# Patient Record
Sex: Female | Born: 1935 | Race: White | Hispanic: No | Smoking: Never smoker
Health system: Southern US, Community
[De-identification: ages and names within clinical notes are randomized; demographics above are authoritative.]

## PROBLEM LIST (undated history)

## (undated) DIAGNOSIS — I10 Essential (primary) hypertension: Secondary | ICD-10-CM

## (undated) DIAGNOSIS — D649 Anemia, unspecified: Secondary | ICD-10-CM

## (undated) DIAGNOSIS — C801 Malignant (primary) neoplasm, unspecified: Secondary | ICD-10-CM

## (undated) DIAGNOSIS — R0600 Dyspnea, unspecified: Secondary | ICD-10-CM

## (undated) DIAGNOSIS — J45909 Unspecified asthma, uncomplicated: Secondary | ICD-10-CM

## (undated) DIAGNOSIS — M1990 Unspecified osteoarthritis, unspecified site: Secondary | ICD-10-CM

## (undated) DIAGNOSIS — K219 Gastro-esophageal reflux disease without esophagitis: Secondary | ICD-10-CM

## (undated) HISTORY — PX: ABDOMINAL HYSTERECTOMY: SHX81

## (undated) HISTORY — PX: RECTAL SURGERY: SHX760

## (undated) HISTORY — PX: BREAST BIOPSY: SHX20

## (undated) HISTORY — PX: BUNIONECTOMY: SHX129

## (undated) HISTORY — PX: INCONTINENCE SURGERY: SHX676

## (undated) HISTORY — PX: CATARACT EXTRACTION, BILATERAL: SHX1313

## (undated) HISTORY — PX: VAGINA SURGERY: SHX829

## (undated) HISTORY — PX: TONSILLECTOMY: SUR1361

---

## 2011-05-22 DIAGNOSIS — N3946 Mixed incontinence: Secondary | ICD-10-CM | POA: Insufficient documentation

## 2011-06-08 DIAGNOSIS — IMO0002 Reserved for concepts with insufficient information to code with codable children: Secondary | ICD-10-CM | POA: Insufficient documentation

## 2012-11-21 DIAGNOSIS — M810 Age-related osteoporosis without current pathological fracture: Secondary | ICD-10-CM | POA: Insufficient documentation

## 2013-06-12 DIAGNOSIS — J45909 Unspecified asthma, uncomplicated: Secondary | ICD-10-CM | POA: Insufficient documentation

## 2014-06-13 DIAGNOSIS — S46211A Strain of muscle, fascia and tendon of other parts of biceps, right arm, initial encounter: Secondary | ICD-10-CM | POA: Insufficient documentation

## 2014-07-17 DIAGNOSIS — K219 Gastro-esophageal reflux disease without esophagitis: Secondary | ICD-10-CM | POA: Insufficient documentation

## 2014-07-17 DIAGNOSIS — J309 Allergic rhinitis, unspecified: Secondary | ICD-10-CM | POA: Insufficient documentation

## 2016-04-10 DIAGNOSIS — M3500 Sicca syndrome, unspecified: Secondary | ICD-10-CM | POA: Insufficient documentation

## 2016-04-10 DIAGNOSIS — I10 Essential (primary) hypertension: Secondary | ICD-10-CM | POA: Insufficient documentation

## 2016-08-17 DIAGNOSIS — C569 Malignant neoplasm of unspecified ovary: Secondary | ICD-10-CM

## 2016-08-17 HISTORY — DX: Malignant neoplasm of unspecified ovary: C56.9

## 2016-12-01 ENCOUNTER — Ambulatory Visit: Payer: Medicare Other

## 2016-12-03 ENCOUNTER — Ambulatory Visit: Payer: Medicare Other

## 2017-04-29 ENCOUNTER — Encounter
Admission: RE | Admit: 2017-04-29 | Discharge: 2017-04-29 | Disposition: A | Discharge status: Home / Self Care | Payer: Medicare Other | Source: Ambulatory Visit | Attending: Surgery | Admitting: Surgery

## 2017-04-29 DIAGNOSIS — I10 Essential (primary) hypertension: Secondary | ICD-10-CM | POA: Insufficient documentation

## 2017-04-29 DIAGNOSIS — R9431 Abnormal electrocardiogram [ECG] [EKG]: Secondary | ICD-10-CM | POA: Insufficient documentation

## 2017-04-29 HISTORY — DX: Anemia, unspecified: D64.9

## 2017-04-29 HISTORY — DX: Unspecified osteoarthritis, unspecified site: M19.90

## 2017-04-29 HISTORY — DX: Essential (primary) hypertension: I10

## 2017-04-29 HISTORY — DX: Gastro-esophageal reflux disease without esophagitis: K21.9

## 2017-04-29 HISTORY — DX: Dyspnea, unspecified: R06.00

## 2017-04-29 HISTORY — DX: Unspecified asthma, uncomplicated: J45.909

## 2017-04-29 NOTE — Patient Instructions (Signed)
Your procedure is scheduled on: Tuesday 05/11/17 Report to [facility]. 2ND FLOOR MEDICAL MALL ENTRANCE. To find out your arrival time please call [phone] between 1PM - 3PM on Monday 05/10/17.  Remember: Instructions that are not followed completely may result in serious medical risk, up to and including death, or upon the discretion of your surgeon and anesthesiologist your surgery may need to be rescheduled.    __X__ 1. Do not eat anything after midnight the night before your    procedure.  No gum chewing or hard candies.  You may drink clear   liquids up to 2 hours before you are scheduled to arrive at the   hospital for your procedure. Do not drink clear liquids within 2   hours of scheduled arrival to the hospital as this may lead to your   procedure being delayed or rescheduled.       Clear liquids include:   Water or Apple juice without pulp   Clear carbohydrate beverage such as Clearfast or Gatorade   Black coffee or Clear Tea (no milk, no creamer, do not add anything   to the coffee or tea)    Diabetics should only drink water   __X__ 2. No Alcohol for 24 hours before or after surgery.   ____ 3. Bring all medications with you on the day of surgery if instructed.    __X__ 4. Notify your doctor if there is any change in your medical condition     (cold, fever, infections).             __X___5. No smoking within 24 hours of your surgery.     Do not wear jewelry, make-up, hairpins, clips or nail polish.  Do not wear lotions, powders, or perfumes.   Do not shave 48 hours prior to surgery. Men may shave face and neck.  Do not bring valuables to the hospital.    [facility] is not responsible for any belongings or valuables.               Contacts, dentures or bridgework may not be worn into surgery.  Leave your suitcase in the car. After surgery it may be brought to your room.  For patients admitted to the hospital, discharge time is determined by your                treatment  team.   Patients discharged the day of surgery will not be allowed to drive home.   Please read over the following fact sheets that you were given:   MRSA Information   __X__ Take these medicines the morning of surgery with A SIP OF WATER:    1. AMLODPINE  2. ESOMEPRAZOLE AT BEDTIME AND AGAIN MORNING OF SURGERY  3.   4.  5.  6.  ____ Fleet Enema (as directed)   ____ Use CHG Soap/SAGE wipes as directed  __X__ Use inhalers on the day of surgery   ____ Stop metformin 2 days prior to surgery    ____ Take 1/2 of usual insulin dose the night before surgery and none on the morning of surgery.   ____ Stop Coumadin/Plavix/aspirin on   __X__ Stop Anti-inflammatories such as Advil, Aleve, Ibuprofen, Motrin, Naproxen, Naprosyn, Goodies,powder, or aspirin products.  OK to take Tylenol.   __X__ Stop supplements, Vitamin E, Fish Oil until after surgery.  THERATEARS 7 DAYS BEFORE  ____ Bring C-Pap to the hospital.

## 2017-04-29 NOTE — Pre-Procedure Instructions (Signed)
EKG/REQUEST FOR CLEARANCE AS INSTRUCTED BY DR PISCITELLO, CALLED AND FAXED TO [place]. ALSO CALLED AND FAXED TO DR Nicholes Stairs. SPOKE TO DAWN

## 2017-05-10 NOTE — Pre-Procedure Instructions (Signed)
CLEARED BY DR Clayborn Bigness 05/06/17

## 2017-05-11 ENCOUNTER — Encounter: Payer: Self-pay | Admitting: Anesthesiology

## 2017-05-11 ENCOUNTER — Encounter
Admission: RE | Disposition: A | Discharge status: Home / Self Care | Payer: Self-pay | Source: Ambulatory Visit | Attending: Surgery

## 2017-05-11 ENCOUNTER — Inpatient Hospital Stay
Admission: RE | Admit: 2017-05-11 | Discharge: 2017-05-15 | DRG: 348 | Disposition: A | Discharge status: Home / Self Care | Payer: Medicare Other | Source: Ambulatory Visit | Attending: Surgery | Admitting: Surgery

## 2017-05-11 ENCOUNTER — Inpatient Hospital Stay: Payer: Medicare Other | Admitting: Anesthesiology

## 2017-05-11 DIAGNOSIS — C800 Disseminated malignant neoplasm, unspecified: Secondary | ICD-10-CM | POA: Diagnosis present

## 2017-05-11 DIAGNOSIS — Z791 Long term (current) use of non-steroidal anti-inflammatories (NSAID): Secondary | ICD-10-CM

## 2017-05-11 DIAGNOSIS — K625 Hemorrhage of anus and rectum: Secondary | ICD-10-CM | POA: Diagnosis not present

## 2017-05-11 DIAGNOSIS — Z9841 Cataract extraction status, right eye: Secondary | ICD-10-CM

## 2017-05-11 DIAGNOSIS — C763 Malignant neoplasm of pelvis: Secondary | ICD-10-CM | POA: Diagnosis present

## 2017-05-11 DIAGNOSIS — M3500 Sicca syndrome, unspecified: Secondary | ICD-10-CM | POA: Diagnosis present

## 2017-05-11 DIAGNOSIS — J45909 Unspecified asthma, uncomplicated: Secondary | ICD-10-CM | POA: Diagnosis present

## 2017-05-11 DIAGNOSIS — Z90710 Acquired absence of both cervix and uterus: Secondary | ICD-10-CM

## 2017-05-11 DIAGNOSIS — R339 Retention of urine, unspecified: Secondary | ICD-10-CM | POA: Diagnosis present

## 2017-05-11 DIAGNOSIS — K219 Gastro-esophageal reflux disease without esophagitis: Secondary | ICD-10-CM | POA: Diagnosis present

## 2017-05-11 DIAGNOSIS — I10 Essential (primary) hypertension: Secondary | ICD-10-CM | POA: Diagnosis present

## 2017-05-11 DIAGNOSIS — Z7951 Long term (current) use of inhaled steroids: Secondary | ICD-10-CM

## 2017-05-11 DIAGNOSIS — Z8249 Family history of ischemic heart disease and other diseases of the circulatory system: Secondary | ICD-10-CM

## 2017-05-11 DIAGNOSIS — D62 Acute posthemorrhagic anemia: Secondary | ICD-10-CM | POA: Diagnosis not present

## 2017-05-11 DIAGNOSIS — Z66 Do not resuscitate: Secondary | ICD-10-CM | POA: Diagnosis present

## 2017-05-11 DIAGNOSIS — K623 Rectal prolapse: Secondary | ICD-10-CM | POA: Diagnosis present

## 2017-05-11 DIAGNOSIS — Z9842 Cataract extraction status, left eye: Secondary | ICD-10-CM

## 2017-05-11 DIAGNOSIS — Z79899 Other long term (current) drug therapy: Secondary | ICD-10-CM

## 2017-05-11 HISTORY — PX: REPAIR OF RECTAL PROLAPSE: SHX6420

## 2017-05-11 LAB — CBC
HCT: 30.4 % — ABNORMAL LOW (ref 35.0–47.0)
Hemoglobin: 10 g/dL — ABNORMAL LOW (ref 12.0–16.0)
MCH: 29.1 pg (ref 26.0–34.0)
MCHC: 32.8 g/dL (ref 32.0–36.0)
MCV: 88.8 fL (ref 80.0–100.0)
Platelets: 386 10*3/uL (ref 150–440)
RBC: 3.43 MIL/uL — ABNORMAL LOW (ref 3.80–5.20)
RDW: 13.8 % (ref 11.5–14.5)
WBC: 6.6 10*3/uL (ref 3.6–11.0)

## 2017-05-11 SURGERY — REPAIR, PROLAPSE, RECTUM
Anesthesia: General | Site: Rectum | Wound class: Contaminated

## 2017-05-11 MED ORDER — PROPOFOL 10 MG/ML IV BOLUS
INTRAVENOUS | Status: DC | PRN
  Administered 2017-05-11: 100 mg via INTRAVENOUS
  Administered 2017-05-11: 30 mg via INTRAVENOUS

## 2017-05-11 MED ORDER — MIDAZOLAM HCL 2 MG/2ML IJ SOLN
INTRAMUSCULAR | Status: AC
  Filled 2017-05-11: qty 2

## 2017-05-11 MED ORDER — DEXTROSE-NACL 5-0.2 % IV SOLN
INTRAVENOUS | Status: DC
  Administered 2017-05-11 – 2017-05-13 (×4): via INTRAVENOUS

## 2017-05-11 MED ORDER — VITAMIN C 500 MG PO TABS
1000.0000 mg | ORAL_TABLET | Freq: Every day | ORAL | Status: DC
  Administered 2017-05-12 – 2017-05-15 (×4): 1000 mg via ORAL
  Filled 2017-05-11 (×5): qty 2

## 2017-05-11 MED ORDER — HYPROMELLOSE 0.3 % OP GEL
1.0000 "application " | Freq: Every day | OPHTHALMIC | Status: DC
  Administered 2017-05-12: 1 via OPHTHALMIC
  Filled 2017-05-11: qty 3.5

## 2017-05-11 MED ORDER — GLUCOSAMINE-MSM-HYALURONIC ACD 750-375-30 MG PO TABS: ORAL_TABLET | Freq: Every day | ORAL | Status: DC

## 2017-05-11 MED ORDER — LOSARTAN POTASSIUM 50 MG PO TABS
100.0000 mg | ORAL_TABLET | Freq: Every day | ORAL | Status: DC
  Administered 2017-05-12 – 2017-05-14 (×3): 100 mg via ORAL
  Filled 2017-05-11 (×4): qty 2

## 2017-05-11 MED ORDER — FENTANYL CITRATE (PF) 100 MCG/2ML IJ SOLN
INTRAMUSCULAR | Status: AC
  Administered 2017-05-11: 25 ug via INTRAVENOUS
  Filled 2017-05-11: qty 2

## 2017-05-11 MED ORDER — ONDANSETRON HCL 4 MG/2ML IJ SOLN: 4.0000 mg | Freq: Once | INTRAMUSCULAR | Status: DC | PRN

## 2017-05-11 MED ORDER — MELOXICAM 7.5 MG PO TABS
15.0000 mg | ORAL_TABLET | Freq: Every day | ORAL | Status: DC
  Administered 2017-05-12 – 2017-05-14 (×3): 15 mg via ORAL
  Filled 2017-05-11 (×2): qty 1
  Filled 2017-05-11 (×2): qty 2

## 2017-05-11 MED ORDER — CYCLOBENZAPRINE HCL 10 MG PO TABS
10.0000 mg | ORAL_TABLET | Freq: Every day | ORAL | Status: DC
  Administered 2017-05-11 – 2017-05-14 (×4): 10 mg via ORAL
  Filled 2017-05-11 (×4): qty 1

## 2017-05-11 MED ORDER — HYDROMORPHONE HCL 1 MG/ML IJ SOLN
INTRAMUSCULAR | Status: AC
  Administered 2017-05-11: 0.5 mg via INTRAVENOUS
  Filled 2017-05-11: qty 1

## 2017-05-11 MED ORDER — ACETAMINOPHEN 650 MG RE SUPP
650.0000 mg | Freq: Four times a day (QID) | RECTAL | Status: DC | PRN
Start: 2017-05-11 — End: 2017-05-15

## 2017-05-11 MED ORDER — HYDROMORPHONE HCL 1 MG/ML IJ SOLN
0.5000 mg | INTRAMUSCULAR | Status: DC | PRN
  Administered 2017-05-11 (×2): 0.5 mg via INTRAVENOUS

## 2017-05-11 MED ORDER — ONDANSETRON HCL 4 MG/2ML IJ SOLN
4.0000 mg | Freq: Once | INTRAMUSCULAR | Status: AC
  Administered 2017-05-11: 4 mg via INTRAVENOUS

## 2017-05-11 MED ORDER — VITAMIN D 1000 UNITS PO TABS
2000.0000 [IU] | ORAL_TABLET | Freq: Every day | ORAL | Status: DC
  Administered 2017-05-12 – 2017-05-15 (×4): 2000 [IU] via ORAL
  Filled 2017-05-11 (×7): qty 2

## 2017-05-11 MED ORDER — ARTIFICIAL TEARS OPHTHALMIC OINT
1.0000 "application " | TOPICAL_OINTMENT | Freq: Every day | OPHTHALMIC | Status: DC
  Administered 2017-05-12 – 2017-05-13 (×3): 1 via OPHTHALMIC
  Filled 2017-05-11: qty 3.5

## 2017-05-11 MED ORDER — DOCUSATE SODIUM 100 MG PO CAPS: 100.0000 mg | ORAL_CAPSULE | Freq: Every evening | ORAL | Status: DC | PRN

## 2017-05-11 MED ORDER — ONDANSETRON HCL 4 MG/2ML IJ SOLN
4.0000 mg | Freq: Four times a day (QID) | INTRAMUSCULAR | Status: DC | PRN
  Administered 2017-05-11: 4 mg via INTRAVENOUS
  Filled 2017-05-11: qty 2

## 2017-05-11 MED ORDER — GLYCOPYRROLATE 0.2 MG/ML IJ SOLN
INTRAMUSCULAR | Status: DC | PRN
  Administered 2017-05-11: 0.2 mg via INTRAVENOUS

## 2017-05-11 MED ORDER — PROPOFOL 10 MG/ML IV BOLUS
INTRAVENOUS | Status: AC
  Filled 2017-05-11: qty 20

## 2017-05-11 MED ORDER — CALCIUM CARBONATE-VITAMIN D 500-200 MG-UNIT PO TABS
1.0000 | ORAL_TABLET | Freq: Every day | ORAL | Status: DC
  Administered 2017-05-12 – 2017-05-15 (×4): 1 via ORAL
  Filled 2017-05-11 (×5): qty 1

## 2017-05-11 MED ORDER — LACTATED RINGERS IV SOLN
INTRAVENOUS | Status: DC
  Administered 2017-05-11: 11:00:00 via INTRAVENOUS

## 2017-05-11 MED ORDER — ROCURONIUM BROMIDE 50 MG/5ML IV SOLN
INTRAVENOUS | Status: AC
  Filled 2017-05-11: qty 1

## 2017-05-11 MED ORDER — LIDOCAINE HCL (PF) 2 % IJ SOLN
INTRAMUSCULAR | Status: AC
  Filled 2017-05-11: qty 2

## 2017-05-11 MED ORDER — PANTOPRAZOLE SODIUM 40 MG PO TBEC
40.0000 mg | DELAYED_RELEASE_TABLET | Freq: Every day | ORAL | Status: DC
  Administered 2017-05-12 – 2017-05-15 (×4): 40 mg via ORAL
  Filled 2017-05-11 (×5): qty 1

## 2017-05-11 MED ORDER — SEVOFLURANE IN SOLN
RESPIRATORY_TRACT | Status: AC
  Filled 2017-05-11: qty 250

## 2017-05-11 MED ORDER — FENTANYL CITRATE (PF) 100 MCG/2ML IJ SOLN
INTRAMUSCULAR | Status: AC
  Filled 2017-05-11: qty 2

## 2017-05-11 MED ORDER — FENTANYL CITRATE (PF) 100 MCG/2ML IJ SOLN
25.0000 ug | INTRAMUSCULAR | Status: DC | PRN
  Administered 2017-05-11 (×4): 25 ug via INTRAVENOUS

## 2017-05-11 MED ORDER — ONDANSETRON HCL 4 MG/2ML IJ SOLN
INTRAMUSCULAR | Status: AC
  Filled 2017-05-11: qty 2

## 2017-05-11 MED ORDER — POLYVINYL ALCOHOL 1.4 % OP SOLN
1.0000 [drp] | Freq: Three times a day (TID) | OPHTHALMIC | Status: DC | PRN
  Filled 2017-05-11: qty 15

## 2017-05-11 MED ORDER — FLUTICASONE FUROATE-VILANTEROL 100-25 MCG/INH IN AEPB
1.0000 | INHALATION_SPRAY | Freq: Every day | RESPIRATORY_TRACT | Status: DC
  Administered 2017-05-11 – 2017-05-14 (×4): 1 via RESPIRATORY_TRACT
  Filled 2017-05-11: qty 28

## 2017-05-11 MED ORDER — AMLODIPINE BESYLATE 5 MG PO TABS
5.0000 mg | ORAL_TABLET | Freq: Every day | ORAL | Status: DC
  Administered 2017-05-12 – 2017-05-15 (×4): 5 mg via ORAL
  Filled 2017-05-11 (×4): qty 1

## 2017-05-11 MED ORDER — TRAMADOL HCL 50 MG PO TABS
50.0000 mg | ORAL_TABLET | ORAL | Status: DC | PRN
  Administered 2017-05-11 – 2017-05-12 (×2): 50 mg via ORAL
  Filled 2017-05-11 (×2): qty 1

## 2017-05-11 MED ORDER — IPRATROPIUM-ALBUTEROL 0.5-2.5 (3) MG/3ML IN SOLN: 3.0000 mL | Freq: Four times a day (QID) | RESPIRATORY_TRACT | Status: DC | PRN

## 2017-05-11 MED ORDER — VITAMIN B-12 1000 MCG PO TABS
2000.0000 ug | ORAL_TABLET | Freq: Every day | ORAL | Status: DC
  Administered 2017-05-12 – 2017-05-15 (×4): 2000 ug via ORAL
  Filled 2017-05-11 (×6): qty 2

## 2017-05-11 MED ORDER — CARBOXYMETHYLCELLULOSE SOD PF 1 % OP GEL: 1.0000 [drp] | Freq: Every day | OPHTHALMIC | Status: DC

## 2017-05-11 MED ORDER — ONDANSETRON 4 MG PO TBDP: 4.0000 mg | ORAL_TABLET | Freq: Four times a day (QID) | ORAL | Status: DC | PRN

## 2017-05-11 MED ORDER — MECLIZINE HCL 25 MG PO TABS
25.0000 mg | ORAL_TABLET | Freq: Three times a day (TID) | ORAL | Status: DC | PRN
  Filled 2017-05-11: qty 1

## 2017-05-11 MED ORDER — MONTELUKAST SODIUM 10 MG PO TABS
10.0000 mg | ORAL_TABLET | Freq: Every day | ORAL | Status: DC
  Administered 2017-05-11 – 2017-05-14 (×4): 10 mg via ORAL
  Filled 2017-05-11 (×4): qty 1

## 2017-05-11 MED ORDER — BUPIVACAINE-EPINEPHRINE (PF) 0.5% -1:200000 IJ SOLN
INTRAMUSCULAR | Status: AC
  Filled 2017-05-11: qty 30

## 2017-05-11 MED ORDER — FENTANYL CITRATE (PF) 100 MCG/2ML IJ SOLN
INTRAMUSCULAR | Status: DC | PRN
  Administered 2017-05-11 (×4): 25 ug via INTRAVENOUS

## 2017-05-11 MED ORDER — ONDANSETRON 4 MG PO TBDP
4.0000 mg | ORAL_TABLET | ORAL | Status: DC | PRN
Start: 2017-05-11 — End: 2017-05-15

## 2017-05-11 MED ORDER — LIDOCAINE HCL (CARDIAC) 20 MG/ML IV SOLN
INTRAVENOUS | Status: DC | PRN
  Administered 2017-05-11: 40 mg via INTRAVENOUS

## 2017-05-11 MED ORDER — ACETAMINOPHEN 325 MG PO TABS
650.0000 mg | ORAL_TABLET | ORAL | Status: DC | PRN
  Administered 2017-05-14: 650 mg via ORAL
  Filled 2017-05-11: qty 2

## 2017-05-11 MED ORDER — ALBUTEROL SULFATE (2.5 MG/3ML) 0.083% IN NEBU: 2.5000 mg | INHALATION_SOLUTION | Freq: Four times a day (QID) | RESPIRATORY_TRACT | Status: DC | PRN

## 2017-05-11 MED ORDER — LORATADINE 10 MG PO TABS
10.0000 mg | ORAL_TABLET | Freq: Every day | ORAL | Status: DC
  Administered 2017-05-12 – 2017-05-15 (×4): 10 mg via ORAL
  Filled 2017-05-11 (×6): qty 1

## 2017-05-11 MED ORDER — DEXAMETHASONE SODIUM PHOSPHATE 10 MG/ML IJ SOLN
INTRAMUSCULAR | Status: DC | PRN
  Administered 2017-05-11: 5 mg via INTRAVENOUS

## 2017-05-11 MED ORDER — ONDANSETRON HCL 4 MG/2ML IJ SOLN
4.0000 mg | INTRAMUSCULAR | Status: DC | PRN
  Administered 2017-05-12: 4 mg via INTRAVENOUS
  Filled 2017-05-11 (×2): qty 2

## 2017-05-11 MED ORDER — FUROSEMIDE 20 MG PO TABS
20.0000 mg | ORAL_TABLET | Freq: Every day | ORAL | Status: DC
  Administered 2017-05-12 – 2017-05-15 (×3): 20 mg via ORAL
  Filled 2017-05-11 (×3): qty 1

## 2017-05-11 MED ORDER — DEXTROSE 5 % IV SOLN
2.0000 g | Freq: Once | INTRAVENOUS | Status: AC
  Administered 2017-05-11: 2 g via INTRAVENOUS
  Filled 2017-05-11: qty 2

## 2017-05-11 SURGICAL SUPPLY — 23 items
BLADE SURG 15 STRL LF DISP TIS (BLADE) ×1 IMPLANT
BLADE SURG 15 STRL SS (BLADE) ×2
CANISTER SUCT 1200ML W/VALVE (MISCELLANEOUS) ×3 IMPLANT
DRAPE LAPAROTOMY 100X77 ABD (DRAPES) ×3 IMPLANT
DRAPE LEGGINS SURG 28X43 STRL (DRAPES) ×3 IMPLANT
DRAPE UNDER BUTTOCK W/FLU (DRAPES) ×3 IMPLANT
ELECT REM PT RETURN 9FT ADLT (ELECTROSURGICAL) ×3
ELECTRODE REM PT RTRN 9FT ADLT (ELECTROSURGICAL) ×1 IMPLANT
GAUZE SPONGE 4X4 12PLY STRL (GAUZE/BANDAGES/DRESSINGS) ×3 IMPLANT
GLOVE BIO SURGEON STRL SZ7.5 (GLOVE) ×3 IMPLANT
LABEL OR SOLS (LABEL) ×3 IMPLANT
NEEDLE HYPO 25X1 1.5 SAFETY (NEEDLE) ×3 IMPLANT
NS IRRIG 500ML POUR BTL (IV SOLUTION) ×3 IMPLANT
PACK BASIN MINOR ARMC (MISCELLANEOUS) ×3 IMPLANT
PAD PREP 24X41 OB/GYN DISP (PERSONAL CARE ITEMS) ×3 IMPLANT
RELOAD PROXIMATE 75MM BLUE (ENDOMECHANICALS) ×9 IMPLANT
SOL PREP PVP 2OZ (MISCELLANEOUS) ×3
SOLUTION PREP PVP 2OZ (MISCELLANEOUS) ×1 IMPLANT
STAPLER PROXIMATE 75MM BLUE (STAPLE) ×3 IMPLANT
SURGILUBE 2OZ TUBE FLIPTOP (MISCELLANEOUS) ×3 IMPLANT
SUT CHROMIC 2 0 SH (SUTURE) IMPLANT
SUT CHROMIC 3 0 SH 27 (SUTURE) ×3 IMPLANT
SYRINGE 10CC LL (SYRINGE) ×3 IMPLANT

## 2017-05-11 NOTE — Op Note (Signed)
OPERATIVE REPORT  PREOPERATIVE  DIAGNOSIS: .Marland Kitchen Rectal prolapse  POSTOPERATIVE DIAGNOSIS: .Marland Kitchen Rectal prolapse  PROCEDURE: .Marland Kitchen Excision of prolapsed rectum  ANESTHESIA:  General  SURGEON: Rochel Brome  MD   INDICATIONS: .Marland Kitchen She has a history of rectal prolapse which occurs when she stands up. There is associated pain. Prolapsed rectum was demonstrated on physical exam and surgery was recommended for definitive treatment.  With the patient on the operating table in the supine position she was placed under general anesthesia. Legs were elevated into the lithotomy position using ankle straps. The perineum was prepared with Betadine solution and draped with sterile towels and sheets. . Lubricating jelly an index finger were used to manipulate the rectum so that it prolapsed approximately 5 cm. This was palpated circumferentially.Marland Kitchen Sphincter tone was very hypotonic. A Babcock clamp was placed on the anterior portion of the prolapse and another Babcock on the posterior portion. Traction was applied as the 75 mm GIA stapler was placed initially on the left side dividing the prolapsed rectum in a longitudinal direction. A reload was placed on the right side dividing the rectum also in a lonitudinal direction. Next a reload of the GIA stapler was placed with a horizontal orientation across the anterior portion of the rectum and activated... A small proximally 4 mm segment was not completely divided and was suture ligated with 3-0 chromic and then divided with the scissors. A similar procedure was carried out with the posterior segment with a horizontal oriented suture line.  The staple line promptly reduced back up into the normal anatomic position.. The staple lines were palpated.. The bivalve anal retractor was introduced. The staple lines were inspected and hemostasis was intact. Only a trace of blood was aspirated. .the resected 2 segments of rectum were submitted in formalin for routine pathology.. Cotton gauze  was applied with 2 inch paper tape  The patient tolerated the procedure satisfactorily and was then prepared for transfer to the recovery room.  Rochel Brome M.D.

## 2017-05-11 NOTE — Progress Notes (Signed)
Pt was helped to the [facility], pt passed a large blood clot from her rectum while urinating. Dr. Tamala Julian was notified of findings, new orders for CBC stat. Pt is also very nausea, PRN zofran was already given with no relief, new orders for one time dose of zofran and change PRN zofran to Q4. Will continue to monitor pt.   Climmie Cronce [employer]

## 2017-05-11 NOTE — Transfer of Care (Signed)
Immediate Anesthesia Transfer of Care Note  Patient: Tamara Mckinney  Procedure(s) Performed: Procedure(s): REPAIR OF RECTAL PROLAPSE (N/A)  Patient Location: PACU  Anesthesia Type:General  Level of Consciousness: sedated  Airway & Oxygen Therapy: Patient Spontanous Breathing and Patient connected to face mask oxygen  Post-op Assessment: Report given to RN and Post -op Vital signs reviewed and stable  Post vital signs: Reviewed and stable  Last Vitals: There were no vitals filed for this visit.  Last Pain: There were no vitals filed for this visit.       Complications: No apparent anesthesia complications

## 2017-05-11 NOTE — Anesthesia Procedure Notes (Signed)
Procedure Name: LMA Insertion Date/Time: 05/11/2017 11:58 AM Performed by: Hedda Slade Pre-anesthesia Checklist: Patient identified, Patient being monitored, Timeout performed, Emergency Drugs available and Suction available Patient Re-evaluated:Patient Re-evaluated prior to induction Oxygen Delivery Method: Circle system utilized Preoxygenation: Pre-oxygenation with 100% oxygen Induction Type: IV induction Ventilation: Mask ventilation without difficulty LMA: LMA inserted LMA Size: 4.0 Tube type: Oral Number of attempts: 1 Placement Confirmation: positive ETCO2 and breath sounds checked- equal and bilateral Tube secured with: Tape Dental Injury: Teeth and Oropharynx as per pre-operative assessment

## 2017-05-11 NOTE — Anesthesia Postprocedure Evaluation (Signed)
Anesthesia Post Note  Patient: Tamara Mckinney  Procedure(s) Performed: Procedure(s) (LRB): REPAIR OF RECTAL PROLAPSE (N/A)  Patient location during evaluation: PACU Anesthesia Type: General Level of consciousness: awake and alert Pain management: pain level controlled Vital Signs Assessment: post-procedure vital signs reviewed and stable Respiratory status: spontaneous breathing, nonlabored ventilation, respiratory function stable and patient connected to nasal cannula oxygen Cardiovascular status: blood pressure returned to baseline and stable Postop Assessment: no apparent nausea or vomiting Anesthetic complications: no     Last Vitals:  Vitals:   05/11/17 1254 05/11/17 1309  BP: (!) 146/83 (!) 142/84  Pulse: 95 94  Resp: 20 18  Temp: 36.4 C   SpO2: 100% 99%    Last Pain:  Vitals:   05/11/17 1309  PainSc: 7                  Omnia Dollinger S

## 2017-05-11 NOTE — Progress Notes (Signed)
PHARMACIST - PHYSICIAN ORDER COMMUNICATION  CONCERNING: P&T Medication Policy on Herbal Medications  DESCRIPTION:  This patient's order for:  Glucosamine-MSM-hyaluronic acid  has been noted.  This product(s) is classified as an "herbal" or natural product. Due to a lack of definitive safety studies or FDA approval, nonstandard manufacturing practices, plus the potential risk of unknown drug-drug interactions while on inpatient medications, the Pharmacy and Therapeutics Committee does not permit the use of "herbal" or natural products of this type within [facility].   ACTION TAKEN: The pharmacy department is unable to verify this order at this time and your patient has been informed of this safety policy. Please reevaluate patient's clinical condition at discharge and address if the herbal or natural product(s) should be resumed at that time.

## 2017-05-11 NOTE — H&P (Signed)
  She comes in today for excision of prolapsing rectum.. She reports she continues to have painful problem with prolapsing rectum each day especially when she stands up.She had recent laboratory findings of elevated creatinine at 1.5 and elevated potassium at 5.8.her potassium later decreased to 5.1. Her Lasix dose was cut from 40 mg down to 20 mg.  She reports no change in overall condition since the office exam.  I discussed the plan for excision of prolapsing rectum.Marland Kitchen Anticipate keeping her overnight for a period of observation.

## 2017-05-11 NOTE — Anesthesia Post-op Follow-up Note (Signed)
Anesthesia QCDR form completed.        

## 2017-05-11 NOTE — Progress Notes (Signed)
Postoperatively she does report some nausea.  She has just received some Zofran.  She reports minimal discomfort in the anal area.  I discussed the surgery and the plan for overnight hospital care.  Anticipate she will tolerate her diet and do some walking prior to discharge.

## 2017-05-11 NOTE — Anesthesia Preprocedure Evaluation (Signed)
Anesthesia Evaluation  Patient identified by MRN, date of birth, ID band Patient awake    Reviewed: Allergy & Precautions, NPO status , Patient's Chart, lab work & pertinent test results, reviewed documented beta blocker date and time   Airway Mallampati: II  TM Distance: >3 FB     Dental  (+) Chipped   Pulmonary shortness of breath, asthma ,           Cardiovascular hypertension, Pt. on medications      Neuro/Psych    GI/Hepatic GERD  Controlled,  Endo/Other    Renal/GU      Musculoskeletal  (+) Arthritis ,   Abdominal   Peds  Hematology  (+) anemia ,   Anesthesia Other Findings EKG shows poor R waves. No cardiac symptoms, will proceed.  Reproductive/Obstetrics                             Anesthesia Physical Anesthesia Plan  ASA: III  Anesthesia Plan: General   Post-op Pain Management:    Induction: Intravenous  PONV Risk Score and Plan:   Airway Management Planned: Oral ETT and LMA  Additional Equipment:   Intra-op Plan:   Post-operative Plan:   Informed Consent: I have reviewed the patients History and Physical, chart, labs and discussed the procedure including the risks, benefits and alternatives for the proposed anesthesia with the patient or authorized representative who has indicated his/her understanding and acceptance.     Plan Discussed with: CRNA  Anesthesia Plan Comments:         Anesthesia Quick Evaluation

## 2017-05-12 DIAGNOSIS — I10 Essential (primary) hypertension: Secondary | ICD-10-CM | POA: Diagnosis present

## 2017-05-12 DIAGNOSIS — D62 Acute posthemorrhagic anemia: Secondary | ICD-10-CM | POA: Diagnosis not present

## 2017-05-12 DIAGNOSIS — Z90710 Acquired absence of both cervix and uterus: Secondary | ICD-10-CM | POA: Diagnosis not present

## 2017-05-12 DIAGNOSIS — K623 Rectal prolapse: Secondary | ICD-10-CM | POA: Diagnosis present

## 2017-05-12 DIAGNOSIS — K625 Hemorrhage of anus and rectum: Secondary | ICD-10-CM | POA: Diagnosis not present

## 2017-05-12 DIAGNOSIS — C800 Disseminated malignant neoplasm, unspecified: Secondary | ICD-10-CM | POA: Diagnosis present

## 2017-05-12 DIAGNOSIS — Z8249 Family history of ischemic heart disease and other diseases of the circulatory system: Secondary | ICD-10-CM | POA: Diagnosis not present

## 2017-05-12 DIAGNOSIS — K219 Gastro-esophageal reflux disease without esophagitis: Secondary | ICD-10-CM | POA: Diagnosis present

## 2017-05-12 DIAGNOSIS — Z791 Long term (current) use of non-steroidal anti-inflammatories (NSAID): Secondary | ICD-10-CM | POA: Diagnosis not present

## 2017-05-12 DIAGNOSIS — Z9841 Cataract extraction status, right eye: Secondary | ICD-10-CM | POA: Diagnosis not present

## 2017-05-12 DIAGNOSIS — J45909 Unspecified asthma, uncomplicated: Secondary | ICD-10-CM | POA: Diagnosis present

## 2017-05-12 DIAGNOSIS — Z9842 Cataract extraction status, left eye: Secondary | ICD-10-CM | POA: Diagnosis not present

## 2017-05-12 DIAGNOSIS — R339 Retention of urine, unspecified: Secondary | ICD-10-CM | POA: Diagnosis present

## 2017-05-12 DIAGNOSIS — Z79899 Other long term (current) drug therapy: Secondary | ICD-10-CM | POA: Diagnosis not present

## 2017-05-12 DIAGNOSIS — M3500 Sicca syndrome, unspecified: Secondary | ICD-10-CM | POA: Diagnosis present

## 2017-05-12 DIAGNOSIS — Z66 Do not resuscitate: Secondary | ICD-10-CM | POA: Diagnosis present

## 2017-05-12 DIAGNOSIS — C763 Malignant neoplasm of pelvis: Secondary | ICD-10-CM | POA: Diagnosis present

## 2017-05-12 DIAGNOSIS — Z7951 Long term (current) use of inhaled steroids: Secondary | ICD-10-CM | POA: Diagnosis not present

## 2017-05-12 LAB — BASIC METABOLIC PANEL
Anion gap: 7 (ref 5–15)
BUN: 33 mg/dL — ABNORMAL HIGH (ref 6–20)
CO2: 25 mmol/L (ref 22–32)
Calcium: 8.5 mg/dL — ABNORMAL LOW (ref 8.9–10.3)
Chloride: 101 mmol/L (ref 101–111)
Creatinine, Ser: 1.05 mg/dL — ABNORMAL HIGH (ref 0.44–1.00)
GFR calc Af Amer: 54 mL/min — ABNORMAL LOW (ref >60)
GFR calc non Af Amer: 46 mL/min — ABNORMAL LOW (ref >60)
Glucose, Bld: 138 mg/dL — ABNORMAL HIGH (ref 65–99)
Potassium: 5 mmol/L (ref 3.5–5.1)
Sodium: 133 mmol/L — ABNORMAL LOW (ref 135–145)

## 2017-05-12 LAB — HEMOGLOBIN AND HEMATOCRIT, BLOOD
HCT: 24.2 % — ABNORMAL LOW (ref 35.0–47.0)
HCT: 21.9 % — ABNORMAL LOW (ref 35.0–47.0)
HCT: 23.2 % — ABNORMAL LOW (ref 35.0–47.0)
Hemoglobin: 8.1 g/dL — ABNORMAL LOW (ref 12.0–16.0)
Hemoglobin: 7.2 g/dL — ABNORMAL LOW (ref 12.0–16.0)
Hemoglobin: 7.9 g/dL — ABNORMAL LOW (ref 12.0–16.0)

## 2017-05-12 LAB — ABO/RH: ABO/RH(D): A POS

## 2017-05-12 LAB — PREPARE RBC (CROSSMATCH)

## 2017-05-12 MED ORDER — MENTHOL 3 MG MT LOZG
1.0000 | LOZENGE | OROMUCOSAL | Status: DC | PRN
  Administered 2017-05-12: 3 mg via ORAL
  Filled 2017-05-12: qty 9

## 2017-05-12 MED ORDER — ALUM & MAG HYDROXIDE-SIMETH 200-200-20 MG/5ML PO SUSP
30.0000 mL | Freq: Four times a day (QID) | ORAL | Status: DC | PRN
  Administered 2017-05-12 – 2017-05-13 (×2): 30 mL via ORAL
  Filled 2017-05-12 (×2): qty 30

## 2017-05-12 MED ORDER — SODIUM CHLORIDE 0.9 % IV SOLN
Freq: Once | INTRAVENOUS | Status: AC
  Administered 2017-05-12: 09:00:00 via INTRAVENOUS

## 2017-05-12 NOTE — Progress Notes (Signed)
Telephoned Dr Tamala Julian four times. No response. Nurse was calling to inform that patient has passed another large clot from rectum. Patient's VSS.

## 2017-05-12 NOTE — Progress Notes (Signed)
She feels some better than this morning.  She has had a small breakfast.  She has had a blood transfusion.  She feels that she is releasing some urine into her adult diaper.  She reports no current bladder distention.  She has not yet done any walking since surgery.  Her IV has been converted to a saline lock.  On examination abdomen is soft and flat and nontender.  There is some black clotted blood oozing out of the rectum.  Sphincter tone is very hypotonic. No prolapse seen.  Oxygen saturation on room air is 99%.  Status post excision of prolapsed rectum, postoperative bleeding with acute and chronic anemia.  Recheck hemoglobin, discontinue oxygen.  Progress discussed with patient and son

## 2017-05-12 NOTE — Progress Notes (Signed)
She has lots of blood from her rectum during the night. Hemoglobin has decreased to 7.2.She reports she had some moderate pain during the night but is better this morning. She was unable to empty her bladder and bladder ultrasound demonstrated distention. And out catheterization was doneattending 1100 cc of urine.  On exam she is awake alert and oriented. Lung sounds are clear. Examination of the rectum reveals clotted blood oozing from the rectum. Sphincter tone is hypotonic. An index finger was inserted and delivered additional clotted blood out of the rectum. No bright red blood was seen.  She has been having some difficulty emptying her bladder over a period of time. She also has history of chronic anemia and has been seeing some blood with bowel movements over the last 2 months.  Basic metabolic panel with creatinine of 1.05, potassium of 5.0. Hemoglobin this morning 7.2.. Platelet count yesterday was 386,000   Impression status post excision of prolapsed rectum, postoperative bleeding, acute and chronic anemia  I recommended a blood transfusion. I discussed transfusion risk and benefits  We'll need to keep her in the hospital for additional time as inpatient

## 2017-05-13 ENCOUNTER — Telehealth: Payer: Self-pay | Admitting: Urology

## 2017-05-13 ENCOUNTER — Encounter: Payer: Self-pay | Admitting: Specialist

## 2017-05-13 LAB — HEMOGLOBIN AND HEMATOCRIT, BLOOD
HCT: 20.8 % — ABNORMAL LOW (ref 35.0–47.0)
Hemoglobin: 7.1 g/dL — ABNORMAL LOW (ref 12.0–16.0)

## 2017-05-13 LAB — PREPARE RBC (CROSSMATCH)

## 2017-05-13 LAB — HEMOGLOBIN: Hemoglobin: 9.5 g/dL — ABNORMAL LOW (ref 12.0–16.0)

## 2017-05-13 MED ORDER — ALUM & MAG HYDROXIDE-SIMETH 200-200-20 MG/5ML PO SUSP: 30.0000 mL | Freq: Four times a day (QID) | ORAL | Status: DC | PRN

## 2017-05-13 MED ORDER — POLYETHYLENE GLYCOL 3350 17 G PO PACK
17.0000 g | PACK | Freq: Every day | ORAL | Status: DC
  Administered 2017-05-13 – 2017-05-15 (×3): 17 g via ORAL
  Filled 2017-05-13 (×3): qty 1

## 2017-05-13 MED ORDER — SODIUM CHLORIDE 0.9 % IV SOLN
Freq: Once | INTRAVENOUS | Status: AC
  Administered 2017-05-13: 16:00:00 via INTRAVENOUS

## 2017-05-13 MED ORDER — FAMOTIDINE 20 MG PO TABS
20.0000 mg | ORAL_TABLET | Freq: Two times a day (BID) | ORAL | Status: DC
  Administered 2017-05-13 – 2017-05-14 (×2): 20 mg via ORAL
  Filled 2017-05-13 (×2): qty 1

## 2017-05-13 NOTE — Telephone Encounter (Signed)
App made ° ° °michelle °

## 2017-05-13 NOTE — Progress Notes (Signed)
I discussed with Dr. Sharin Grave in urology who recommended management with the catheter possibly go home with a catheter and follow up in the office. She did have in and out catheterization approximately 1 PM. She has not voided since then. She does report some heartburn. She is taking small amounts of solid food and drinking some water. She continues to receive her maintenance IV at 50 cc/h. She is now receiving the order blood transfusion. She has not walked since I saw her this morning. Her son is going to bring her walker from their car.  She is awake alert and oriented. Abdomen is with some gaseous distention otherwise soft and nontender. Examination of the anal area reveals just a trace of blood on gauze.A finger was introduced into the rectum and did find some dark blood stain.  Plan is to obtain a walker and ambulate in the hallway. Also recommended that she attempt to empty her bladder sitting on a commode. Also recommended sitting up in a chair for meals. If she cannot void and bladder scan is greater than 400 cc we'll insert indwelling Foley urinary catheter. Repeat hemoglobin and metabolic panel in a.m.

## 2017-05-13 NOTE — Progress Notes (Signed)
She reports some gradual improvement. There is been no significant bleeding since last evening. She reports she has taken some solid food but notes early satiety. No nausea or vomiting. She reports some abdominal distention. She is not aware of passing her urine but has had in and out catheterization numerous times. She has walked several steps in her room.  On examination her abdomen is protuberant and tympanitic and soft and nontender. External anal appearance reveals only a trace of blood on digital rectal exam demonstrates just a trace of blood.  Hemoglobin this morning is 7.1  Impression: Status post excision of prolapsed rectum Acute and chronic anemia Urinary retention Obstipation  Plan is to give her MiraLAX. I discussed with one of the local urologist recommended using catheter as needed for urinary retention. May possibly need is sent home with Foley catheter. Also recommended and have ordered a second blood transfusion. Work on ambulation in the hallway as tolerated.

## 2017-05-13 NOTE — Consult Note (Signed)
[facility] [place] NAME: Tamara Mckinney    MR#:  [id number]  DATE OF BIRTH:  1928/07/26  DATE OF CONSULT:  05/13/2017  PRIMARY CARE PHYSICIAN: [name], MD   REQUESTING/REFERRING PHYSICIAN: Dr. Jamal Collin.    CHIEF COMPLAINT:  No chief complaint on file. Chest pain.    HISTORY OF PRESENT ILLNESS:  Tamara Mckinney  is a 83 y.o. female with a known history of Hypertension, GERD, asthma, Sjogren's who presents to the hospital due to rectal prolapse and is status post excision of the rectal prolapse and postoperatively noted to have some intermittent bleeding for which she is getting transfused.  His evening patient was noted to have some vague chest pain. Patient complains of achiness in the midsternum but nonradiating to her neck or left arm. She denies any shortness of breath, diaphoresis palpitations nausea or vomiting. Hospitalist services were contacted for consultation. EKG reviewed shows sinus tachycardia with no acute ST or T-wave changes. Patient denies any other associated symptoms presently.  PAST MEDICAL HISTORY:   Past Medical History:  Diagnosis Date  . Anemia   . Arthritis   . Asthma   . Dyspnea   . GERD (gastroesophageal reflux disease)   . Hypertension   . Sjogren's disease ([place])     PAST SURGICAL HISTOIRY:   Past Surgical History:  Procedure Laterality Date  . ABDOMINAL HYSTERECTOMY    . BREAST BIOPSY     x6  . BUNIONECTOMY Bilateral   . CATARACT EXTRACTION, BILATERAL    . INCONTINENCE SURGERY    . REPAIR OF RECTAL PROLAPSE N/A 05/11/2017   Procedure: REPAIR OF RECTAL PROLAPSE;  Surgeon: Leonie Green, MD;  Location: [facility] ORS;  Service: General;  Laterality: N/A;  . TONSILLECTOMY      SOCIAL HISTORY:   Social History  Substance Use Topics  . Smoking status: Never Smoker  . Smokeless tobacco: Never Used  . Alcohol use No    FAMILY HISTORY:   Family History  Problem Relation Age of Onset  . Heart  disease Mother   . Heart disease Father     DRUG ALLERGIES:  No Known Allergies  REVIEW OF SYSTEMS:   Review of Systems  Constitutional: Negative for fever and weight loss.  HENT: Negative for congestion, nosebleeds and tinnitus.   Eyes: Negative for blurred vision, double vision and redness.  Respiratory: Negative for cough, hemoptysis and shortness of breath.   Cardiovascular: Positive for chest pain. Negative for orthopnea, leg swelling and PND.  Gastrointestinal: Negative for abdominal pain, diarrhea, melena, nausea and vomiting.  Genitourinary: Negative for dysuria, hematuria and urgency.  Musculoskeletal: Negative for falls and joint pain.  Neurological: Negative for dizziness, tingling, sensory change, focal weakness, seizures, weakness and headaches.  Endo/Heme/Allergies: Negative for polydipsia. Does not bruise/bleed easily.  Psychiatric/Behavioral: Negative for depression and memory loss. The patient is not nervous/anxious.      MEDICATIONS AT HOME:   Prior to Admission medications   Medication Sig Start Date End Date Taking? Authorizing Provider  acetaminophen (TYLENOL) 500 MG tablet Take 500 mg by mouth 2 (two) times daily as needed (for pain/sinus issues).   Yes [provider]  albuterol (PROVENTIL HFA;VENTOLIN HFA) 108 (90 Base) MCG/ACT inhaler Inhale 1-2 puffs into the lungs every 6 (six) hours as needed for wheezing or shortness of breath.   Yes [provider]  amLODipine (NORVASC) 5 MG tablet Take 5 mg by mouth daily with breakfast.   Yes [provider]  Ascorbic Acid (VITAMIN C) 1000 MG tablet Take 1,000 mg by mouth daily.   Yes [provider]  Calcium Carb-Cholecalciferol (CALTRATE 600+D) 600-800 MG-UNIT TABS Take 1 tablet by mouth daily.   Yes [provider]  Carboxymeth-Glyc-Polysorb PF (REFRESH OPTIVE ADVANCED PF) 0.5-1-0.5 % SOLN Place 1-2 drops into both eyes 3 (three) times daily as needed (for dry irritated  eyes).   Yes [provider]  Carboxymethylcellulose Sod PF (REFRESH CELLUVISC) 1 % GEL Place 1 drop into both eyes daily. In the middle of the night   Yes [provider]  cetirizine (ZYRTEC) 10 MG tablet Take 10 mg by mouth at bedtime as needed for allergies.   Yes [provider]  Cholecalciferol (VITAMIN D3) 2000 units capsule Take 2,000 Units by mouth daily.   Yes [provider]  Cyanocobalamin (RA VITAMIN B12) 2000 MCG TBCR Take 2,000 mcg by mouth daily.   Yes [provider]  cyclobenzaprine (FLEXERIL) 10 MG tablet Take 10 mg by mouth at bedtime.   Yes [provider]  DHA-EPA-Flaxseed Oil-Vitamin E (THERA TEARS NUTRITION PO) Take 2 tablets by mouth daily.   Yes [provider]  docusate sodium (COLACE) 100 MG capsule Take 100-300 mg by mouth at bedtime as needed for mild constipation.   Yes [provider]  esomeprazole (NEXIUM) 40 MG capsule Take 40 mg by mouth daily at 12 noon.   Yes [provider]  fluticasone furoate-vilanterol (BREO ELLIPTA) 100-25 MCG/INH AEPB Inhale 1 puff into the lungs at bedtime.   Yes [provider]  furosemide (LASIX) 40 MG tablet Take 20 mg by mouth daily with breakfast.    Yes [provider]  Glucosamine-MSM-Hyaluronic Acd ([place]) Take 1 tablet by mouth daily.   Yes [provider]  hypromellose (SYSTANE OVERNIGHT THERAPY) 0.3 % GEL ophthalmic ointment Place 1 application into both eyes at bedtime.   Yes [provider]  ipratropium-albuterol (DUONEB) 0.5-2.5 (3) MG/3ML SOLN Take 3 mLs by nebulization every 6 (six) hours as needed (for wheezing/shortness of breath).   Yes [provider]  losartan (COZAAR) 100 MG tablet Take 100 mg by mouth at bedtime.   Yes [provider]  meclizine (ANTIVERT) 25 MG tablet Take 25 mg by mouth 3 (three) times daily as needed (for dizziness/vertigo).   Yes [provider]   meloxicam (MOBIC) 15 MG tablet Take 15 mg by mouth daily with breakfast.   Yes [provider]  montelukast (SINGULAIR) 10 MG tablet Take 10 mg by mouth at bedtime.   Yes [provider]  triamcinolone cream (KENALOG) 0.5 % Apply 1 application topically as needed.   Yes [provider]      VITAL SIGNS:  Blood pressure 139/74, pulse (!) 117, temperature 99.2 F (37.3 C), temperature source Oral, resp. rate 17, height 4\' 9"  (1.448 m), weight 45.4 kg (100 lb), SpO2 96 %.  PHYSICAL EXAMINATION:  GENERAL:  83 y.o.-year-old patient lying in the bed in no acute distress.  EYES: Pupils equal, round, reactive to light and accommodation. No scleral icterus. Extraocular muscles intact.  HEENT: Head atraumatic, normocephalic. Oropharynx and nasopharynx clear.  NECK:  Supple, no jugular venous distention. No thyroid enlargement, no tenderness.  LUNGS: Normal breath sounds bilaterally, no wheezing, rales, rhonchi . No use of accessory muscles of respiration.  CARDIOVASCULAR: S1, S2, RRR, Tachycardic. No murmurs, rubs, gallops, clicks.  ABDOMEN: Soft, nontender, nondistended. Bowel sounds present. No organomegaly or mass.  EXTREMITIES: No pedal edema, cyanosis, or clubbing.  NEUROLOGIC: Cranial nerves II through XII are intact. No focal motor or sensory deficits appreciated bilaterally  PSYCHIATRIC: The patient is alert and oriented x 3.  SKIN: No obvious rash, lesion, or ulcer.   LABORATORY PANEL:   CBC  Recent Labs Lab 05/11/17 1745  05/13/17 0415  WBC 6.6  --   --   HGB 10.0*  < > 7.1*  HCT 30.4*  < > 20.8*  PLT 386  --   --   < > = values in this interval not displayed. ------------------------------------------------------------------------------------------------------------------  Chemistries   Recent Labs Lab 05/12/17 0207  NA 133*  K 5.0  CL 101  CO2 25  GLUCOSE 138*  BUN 33*  CREATININE 1.05*  CALCIUM 8.5*    ------------------------------------------------------------------------------------------------------------------  Cardiac Enzymes No results for input(s): TROPONINI in the last 168 hours. ------------------------------------------------------------------------------------------------------------------  RADIOLOGY:  No results found.   IMPRESSION AND PLAN:   83 year old female with past medical history of GERD, hypertension, Sjogren's disease, asthma, osteoarthritis who presented to the hospital due to rectal prolapse status post excision of the rectal prolapse with some postoperative bleeding.  Hospitalist services were consulted for patient having some atypical chest pain.  1. Chest pain-patient's symptoms are very atypical for angina. I think her symptoms are most likely related to indigestion/dyspepsia. I would continue some Maalox as needed. EKG has been review shows sinus tachycardia with no acute ST or T-wave changes. -Patient is slightly tachycardic but likely that is related to her volume loss from her anemia, and I will check a stat hemoglobin and transfuse if needed.  2. Rectal prolapse status post excision with some postoperative bleeding-continue further care as per general surgery. Continue to transfuse as needed. I will check a stat hemoglobin now.  3. Urinary retention-patient has been getting intermittent and out catheterization. Continue care as per surgery and urology as needed.  4. Essential hypertension-continue losartan, Norvasc.  5. Asthma-no acute exacerbation. Cont. Breo-Elipta - cont. duonebs as needed.   6. GERD - cont. Protonix/Maalox.   Thank you so much for the consultation will follow along with you.   All the records are reviewed and case discussed with Consulting provider. Management plans discussed with the patient, family and they are in agreement.  CODE STATUS: Full code  TOTAL TIME TAKING CARE OF THIS PATIENT: 45 minutes.     Henreitta Leber M.D on 05/13/2017 at 9:10 PM  Between 7am to 6pm - Pager - [phone]  After 6pm go to [email or web address] - password [email or web address] [facility]  Office  [phone]  CC: Primary care Physician: [name], MD

## 2017-05-13 NOTE — Telephone Encounter (Signed)
-----   Message from Nickie Retort, MD sent at 05/13/2017 11:17 AM EDT ----- Patient needs to be seen for outpt follow up in 1 - 2 weeks. Needs AM appt. Will likely have foley and needs TOV

## 2017-05-14 ENCOUNTER — Inpatient Hospital Stay: Payer: Medicare Other

## 2017-05-14 LAB — HEMOGLOBIN AND HEMATOCRIT, BLOOD
HCT: 26.1 % — ABNORMAL LOW (ref 35.0–47.0)
Hemoglobin: 9.1 g/dL — ABNORMAL LOW (ref 12.0–16.0)

## 2017-05-14 LAB — BASIC METABOLIC PANEL
Anion gap: 6 (ref 5–15)
BUN: 31 mg/dL — ABNORMAL HIGH (ref 6–20)
CO2: 25 mmol/L (ref 22–32)
Calcium: 7.9 mg/dL — ABNORMAL LOW (ref 8.9–10.3)
Chloride: 99 mmol/L — ABNORMAL LOW (ref 101–111)
Creatinine, Ser: 1.15 mg/dL — ABNORMAL HIGH (ref 0.44–1.00)
GFR calc Af Amer: 48 mL/min — ABNORMAL LOW (ref >60)
GFR calc non Af Amer: 42 mL/min — ABNORMAL LOW (ref >60)
Glucose, Bld: 96 mg/dL (ref 65–99)
Potassium: 4.6 mmol/L (ref 3.5–5.1)
Sodium: 130 mmol/L — ABNORMAL LOW (ref 135–145)

## 2017-05-14 LAB — BPAM RBC
Blood Product Expiration Date: 201810022359
Blood Product Expiration Date: 201810122359
ISSUE DATE / TIME: 201809260857
ISSUE DATE / TIME: 201809271557
Unit Type and Rh: 600
Unit Type and Rh: 6200

## 2017-05-14 LAB — TYPE AND SCREEN
ABO/RH(D): A POS
Antibody Screen: NEGATIVE
Unit division: 0
Unit division: 0

## 2017-05-14 LAB — TROPONIN I: Troponin I: <0.03 ng/mL (ref <0.03)

## 2017-05-14 MED ORDER — IOPAMIDOL (ISOVUE-300) INJECTION 61%
15.0000 mL | INTRAVENOUS | Status: AC
  Administered 2017-05-14 (×2): 15 mL via ORAL

## 2017-05-14 MED ORDER — POLYVINYL ALCOHOL 1.4 % OP SOLN
1.0000 [drp] | Freq: Every day | OPHTHALMIC | Status: DC
  Filled 2017-05-14: qty 15

## 2017-05-14 MED ORDER — IOPAMIDOL (ISOVUE-300) INJECTION 61%
60.0000 mL | Freq: Once | INTRAVENOUS | Status: AC | PRN
  Administered 2017-05-14: 60 mL via INTRAVENOUS

## 2017-05-14 MED ORDER — POLYETHYL GLYCOL-PROPYL GLYCOL 0.4-0.3 % OP GEL
1.0000 "application " | Freq: Every day | OPHTHALMIC | Status: DC
  Filled 2017-05-14: qty 10

## 2017-05-14 MED ORDER — FAMOTIDINE 20 MG PO TABS
20.0000 mg | ORAL_TABLET | Freq: Every day | ORAL | Status: DC
  Administered 2017-05-15: 20 mg via ORAL
  Filled 2017-05-14: qty 1

## 2017-05-14 NOTE — Progress Notes (Signed)
Pt c/o epigastric discomfort radiating to the left side of her chest. Pt stated this pain is different in nature from her usual heartburn. Md (surgeon on-call) notified. Hospitalist consult ordered. PT seen by hospitalist, Orders for PRN maalox, EKG, and stat hgb. hGb - 9.5. EKG wdl. Checked on patient after consult and she was sleeping. When she got up to void I asked her about her chest discomfort and she said it had resolved. Will continue to monitor.

## 2017-05-14 NOTE — Progress Notes (Signed)
[place] [facility] [place] NAME: Tamara Mckinney    MR#:  [id number]  DATE OF BIRTH:  02/23/1928  SUBJECTIVE:  CHIEF COMPLAINT:  No chief complaint on file.  Sitting in chair. Drinking contrast. NO CP Her pain was typical of her long standing heartburn  EKG, troponin normal  REVIEW OF SYSTEMS:    Review of Systems  Constitutional: Positive for malaise/fatigue. Negative for chills and fever.  HENT: Negative for sore throat.   Eyes: Negative for blurred vision, double vision and pain.  Respiratory: Negative for cough, hemoptysis, shortness of breath and wheezing.   Cardiovascular: Negative for chest pain, palpitations, orthopnea and leg swelling.  Gastrointestinal: Positive for blood in stool and heartburn. Negative for abdominal pain, constipation, diarrhea, nausea and vomiting.  Genitourinary: Negative for dysuria and hematuria.  Musculoskeletal: Negative for back pain and joint pain.  Skin: Negative for rash.  Neurological: Positive for weakness. Negative for sensory change, speech change, focal weakness and headaches.  Endo/Heme/Allergies: Does not bruise/bleed easily.  Psychiatric/Behavioral: Negative for depression. The patient is not nervous/anxious.     DRUG ALLERGIES:  No Known Allergies  VITALS:  Blood pressure 116/71, pulse (!) 101, temperature 98 F (36.7 C), temperature source Oral, resp. rate 17, height 4\' 9"  (1.448 m), weight 45.4 kg (100 lb), SpO2 96 %.  PHYSICAL EXAMINATION:   Physical Exam  GENERAL:  83 y.o.-year-old patient lying in the bed with no acute distress.  EYES: Pupils equal, round, reactive to light and accommodation. No scleral icterus. Extraocular muscles intact.  HEENT: Head atraumatic, normocephalic. Oropharynx and nasopharynx clear.  NECK:  Supple, no jugular venous distention. No thyroid enlargement, no tenderness.  LUNGS: Normal breath sounds bilaterally, no wheezing, rales, rhonchi. No use of accessory  muscles of respiration.  CARDIOVASCULAR: S1, S2 normal. No murmurs, rubs, or gallops.  ABDOMEN: Soft, nontender, distended. Bowel sounds present. No organomegaly or mass.  EXTREMITIES: No cyanosis, clubbing or edema b/l.    NEUROLOGIC: Cranial nerves II through XII are intact. No focal Motor or sensory deficits b/l.   PSYCHIATRIC: The patient is alert and oriented x 3.  SKIN: No obvious rash, lesion, or ulcer.   LABORATORY PANEL:   CBC  Recent Labs Lab 05/11/17 1745  05/14/17 0356  WBC 6.6  --   --   HGB 10.0*  < > 9.1*  HCT 30.4*  < > 26.1*  PLT 386  --   --   < > = values in this interval not displayed. ------------------------------------------------------------------------------------------------------------------ Chemistries   Recent Labs Lab 05/14/17 0356  NA 130*  K 4.6  CL 99*  CO2 25  GLUCOSE 96  BUN 31*  CREATININE 1.15*  CALCIUM 7.9*   ------------------------------------------------------------------------------------------------------------------  Cardiac Enzymes  Recent Labs Lab 05/14/17 0356  TROPONINI <0.03   ------------------------------------------------------------------------------------------------------------------  RADIOLOGY:  No results found.   ASSESSMENT AND PLAN:    83 year old female with past medical history of GERD, hypertension, Sjogren's disease, asthma, osteoarthritis who presented to the hospital due to rectal prolapse status post excision of the rectal prolapse with some postoperative bleeding.  Hospitalist services were consulted for patient having some atypical chest pain.  1. Chest pain - Non cardiac EKG- NO acute changes Troponin normal Had recent cardiology w/u which was normal Resolved  2. Rectal prolapse status post excision with some postoperative bleeding-continue further care as per general surgery. Continue to transfuse as needed.  CT abd/pelvis ordered  3. Urinary retention-patient has been getting  intermittent in  out catheterization.   4. Essential hypertension-continue losartan, Norvasc.  5. Asthma-no acute exacerbation. Cont. Breo-Elipta - cont. duonebs as needed.   6. GERD - cont. Protonix/Maalox.   All the records are reviewed and case discussed with Care Management/Social Workerr. Management plans discussed with the patient, family and they are in agreement.  CODE STATUS: DNR  DVT Prophylaxis: SCDs  TOTAL TIME TAKING CARE OF THIS PATIENT: 20 minutes.   Hillary Bow R M.D on 05/14/2017 at 1:44 PM  Between 7am to 6pm - Pager - [phone]  After 6pm go to [email or web address] - password [email or web address] [facility]  Office  [phone]  CC: Primary care physician; [name], MD  Note: This dictation was prepared with Dragon dictation along with smaller phrase technology. Any transcriptional errors that result from this process are unintentional.

## 2017-05-14 NOTE — Progress Notes (Signed)
I came to see her 2 times today.  The pathologist called this morning to report some malignant cells were seen on the external surface of the rectum.  Findings suggested a GYN etiology.  She reports improvement today.  She has been able to sit up in a chair.  She has also walked to the bathroom and emptied her bladder.  She is also walked in the hallway.  She reports improved oral intake today.  Her heartburn is much improved.  She has not yet had a bowel movement.  During the night she had internal medicine consultation and appeared to have some atypical chest pain which may be her reflux.  On examination external anal appearance demonstrates just a small amount of blood staining on the dressing.  Her abdomen is mildly protuberant and tympanitic and soft and nontender  Basic metabolic panel with a potassium of 4.6 BUN 31 and creatinine 1.15.  Hemoglobin 9.1, troponin normal  After the first visit I ordered a CT scan of the abdomen and pelvis with contrast.  I later reviewed these images and also discussed with the radiologist.  There is evidence of metastatic nodules in much of the abdomen including the surface of the liver the omentum.  There appears to be some nodularity in the pelvis and also a nodule attached to her bladder.  There is also a cyst of the left adnexa  Impression: Status post resection of prolapsing rectum Acute and chronic anemia Multiple nodules of the abdomen suggesting ovarian cancer Improvement in heartburn  I discussed these findings with her and her family.  I anticipate oncology consultation as outpatient.  Also advised her we need to see she is moving her bowels satisfactorily prior to discharge.  She has been receiving MiraLAX each day.  I encouraged continued walking to help maintain her strength.

## 2017-05-14 NOTE — Progress Notes (Signed)
Advance care planning  Discussed with patient with HCPOA son at bedside.  Dicussed about her present admission, anemia, rectal bleeding and chest pain.  We discussed regarding her code status. She wasn't sure about her advance directives which are not avialable at this time. Son mentions she is a DNR. I explained to her regarding DNR/DNI and she agrees to this. Orders entered.  Time spent 20 minutes

## 2017-05-15 LAB — BASIC METABOLIC PANEL
Anion gap: 7 (ref 5–15)
BUN: 32 mg/dL — ABNORMAL HIGH (ref 6–20)
CO2: 26 mmol/L (ref 22–32)
Calcium: 8.3 mg/dL — ABNORMAL LOW (ref 8.9–10.3)
Chloride: 98 mmol/L — ABNORMAL LOW (ref 101–111)
Creatinine, Ser: 1.22 mg/dL — ABNORMAL HIGH (ref 0.44–1.00)
GFR calc Af Amer: 45 mL/min — ABNORMAL LOW (ref >60)
GFR calc non Af Amer: 39 mL/min — ABNORMAL LOW (ref >60)
Glucose, Bld: 88 mg/dL (ref 65–99)
Potassium: 4.7 mmol/L (ref 3.5–5.1)
Sodium: 131 mmol/L — ABNORMAL LOW (ref 135–145)

## 2017-05-15 LAB — HEMOGLOBIN AND HEMATOCRIT, BLOOD
HCT: 25.9 % — ABNORMAL LOW (ref 35.0–47.0)
Hemoglobin: 9.1 g/dL — ABNORMAL LOW (ref 12.0–16.0)

## 2017-05-15 NOTE — Discharge Instructions (Signed)
Continue with a regular diet after discharge.  Gradually increase walking as tolerated.  Take decussate sodium a stool softener each day.  Take MiraLAX 1 capful each day. If stool becomes too loose then cut the amount of MiraLAX in half.  The surgery department will call with the final microscope report and arrange for follow-up.

## 2017-05-15 NOTE — Progress Notes (Signed)
Patient discharged to home as ordered. Patient instructed to follow up with [facility] on October 8th 2018 at 45 AM. No changes were made to patients home medications. Patient is alert and oriented time 3, ambulates with walker. Patient sons at the bedside to take patient home. No acute distress noted.

## 2017-05-15 NOTE — Discharge Summary (Signed)
Physician Final Progress Note  Patient ID: Tamara Mckinney MRN: [id number] DOB/AGE: [birth year] 83 y.o.  Admit date: 05/11/2017 Admitting provider: Leonie Green, MD Discharge date: 05/15/2017   Admission Diagnoses: Rectal prolapse, chronic anemia  Discharge Diagnoses:  Active Problems:   Rectal prolapse  Pelvic malignancy with carcinomatosis Acute and chronic anemia  Consults: She had internal medicine consultation for atypical chest pain which appears to be most likely related to gastroesophageal reflux.  Significant Findings/ Diagnostic Studies: Preliminary pathology report for the prolapsed rectum did identify some malignant cells on the external surface of the rectum. Additional microscope studies are pending. CT scan demonstrated carcinomatosis and some ascites  Procedures: Excision of prolapsed rectum, 2 blood transfusions  Hospital course: She came in through the outpatient surgery department and had excision of rectal prolapse. Postoperatively she did have some rectal bleeding and was anemic and had 2 blood transfusions. Preliminary pathology demonstrated malignant cells. CT demonstrated carcinomatosis. She did have some difficulty with urinary retention and had multiple and out urinary catheterizations. With time she began to empty her bladder when ambulating to the bathroom.  Discharge Condition: Satisfactory for discharge.  Instructions:Continue with a regular diet after discharge.  Gradually increase walking as tolerated.  Take decussate sodium a stool softener each day.  Take MiraLAX 1 capful each day. If stool becomes too loose then cut the amount of MiraLAX in half.  The surgery department will call with the final microscope report and arrange for follow-up.   Disposition: Discharge home with plans for follow-up.  Diet: Regular diet  Discharge Activity: Gradually increase walking      Total time spent taking care of this patient: 30  minutes  Signed: Rochel Brome 05/15/2017, 12:36 PM

## 2017-05-17 ENCOUNTER — Telehealth: Payer: Self-pay | Admitting: Oncology

## 2017-05-17 ENCOUNTER — Other Ambulatory Visit: Payer: Self-pay | Admitting: Pathology

## 2017-05-17 LAB — SURGICAL PATHOLOGY

## 2017-05-17 NOTE — Telephone Encounter (Signed)
Consult for Carcinomacytosis. Ref by Dr Rochel Brome. See Hospital Notes. Appt  schd & conf with patient & Judeen Hammans @ ref office. New patient packet left @ registration fto be completed by patient. 05/19/17 @ 1:30 pm, per Dr Woodfin Ganja. MF

## 2017-05-17 NOTE — Telephone Encounter (Signed)
Consult for Carcinomatosis. Ref by Dr Rochel Brome. See Hospital Notes. Appt  schd & conf with patient & Judeen Hammans @ ref office. New patient packet left @ registration fto be completed by patient.  Patient called back to rschd appt from 10/03 to 10/05 @ 1:30 pm with Dr Janese Banks. MF

## 2017-05-18 ENCOUNTER — Other Ambulatory Visit: Payer: Self-pay

## 2017-05-19 ENCOUNTER — Inpatient Hospital Stay: Payer: Medicare Other

## 2017-05-19 ENCOUNTER — Ambulatory Visit: Payer: Medicare Other | Admitting: Oncology

## 2017-05-19 ENCOUNTER — Inpatient Hospital Stay
Admission: EM | Admit: 2017-05-19 | Discharge: 2017-05-22 | DRG: 920 | Disposition: A | Discharge status: Home Health | Payer: Medicare Other | Attending: Internal Medicine | Admitting: Internal Medicine

## 2017-05-19 ENCOUNTER — Emergency Department: Payer: Medicare Other

## 2017-05-19 DIAGNOSIS — K529 Noninfective gastroenteritis and colitis, unspecified: Secondary | ICD-10-CM | POA: Diagnosis present

## 2017-05-19 DIAGNOSIS — I959 Hypotension, unspecified: Secondary | ICD-10-CM | POA: Diagnosis present

## 2017-05-19 DIAGNOSIS — N179 Acute kidney failure, unspecified: Secondary | ICD-10-CM

## 2017-05-19 DIAGNOSIS — Z66 Do not resuscitate: Secondary | ICD-10-CM | POA: Diagnosis not present

## 2017-05-19 DIAGNOSIS — J45909 Unspecified asthma, uncomplicated: Secondary | ICD-10-CM | POA: Diagnosis present

## 2017-05-19 DIAGNOSIS — K219 Gastro-esophageal reflux disease without esophagitis: Secondary | ICD-10-CM | POA: Diagnosis present

## 2017-05-19 DIAGNOSIS — K623 Rectal prolapse: Secondary | ICD-10-CM | POA: Diagnosis not present

## 2017-05-19 DIAGNOSIS — D62 Acute posthemorrhagic anemia: Secondary | ICD-10-CM

## 2017-05-19 DIAGNOSIS — Z8249 Family history of ischemic heart disease and other diseases of the circulatory system: Secondary | ICD-10-CM | POA: Diagnosis not present

## 2017-05-19 DIAGNOSIS — K5900 Constipation, unspecified: Secondary | ICD-10-CM | POA: Diagnosis present

## 2017-05-19 DIAGNOSIS — Z9889 Other specified postprocedural states: Secondary | ICD-10-CM | POA: Diagnosis not present

## 2017-05-19 DIAGNOSIS — D500 Iron deficiency anemia secondary to blood loss (chronic): Secondary | ICD-10-CM | POA: Diagnosis not present

## 2017-05-19 DIAGNOSIS — R51 Headache: Secondary | ICD-10-CM | POA: Diagnosis present

## 2017-05-19 DIAGNOSIS — E875 Hyperkalemia: Secondary | ICD-10-CM | POA: Diagnosis present

## 2017-05-19 DIAGNOSIS — C569 Malignant neoplasm of unspecified ovary: Secondary | ICD-10-CM | POA: Diagnosis present

## 2017-05-19 DIAGNOSIS — M3500 Sicca syndrome, unspecified: Secondary | ICD-10-CM | POA: Diagnosis present

## 2017-05-19 DIAGNOSIS — R1110 Vomiting, unspecified: Secondary | ICD-10-CM

## 2017-05-19 DIAGNOSIS — K625 Hemorrhage of anus and rectum: Secondary | ICD-10-CM

## 2017-05-19 DIAGNOSIS — Z7951 Long term (current) use of inhaled steroids: Secondary | ICD-10-CM | POA: Diagnosis not present

## 2017-05-19 DIAGNOSIS — Z79899 Other long term (current) drug therapy: Secondary | ICD-10-CM

## 2017-05-19 DIAGNOSIS — M1990 Unspecified osteoarthritis, unspecified site: Secondary | ICD-10-CM | POA: Diagnosis present

## 2017-05-19 DIAGNOSIS — I10 Essential (primary) hypertension: Secondary | ICD-10-CM | POA: Diagnosis present

## 2017-05-19 DIAGNOSIS — C786 Secondary malignant neoplasm of retroperitoneum and peritoneum: Secondary | ICD-10-CM | POA: Diagnosis present

## 2017-05-19 DIAGNOSIS — K922 Gastrointestinal hemorrhage, unspecified: Secondary | ICD-10-CM | POA: Diagnosis not present

## 2017-05-19 DIAGNOSIS — K91840 Postprocedural hemorrhage and hematoma of a digestive system organ or structure following a digestive system procedure: Principal | ICD-10-CM | POA: Diagnosis present

## 2017-05-19 DIAGNOSIS — D649 Anemia, unspecified: Secondary | ICD-10-CM | POA: Diagnosis present

## 2017-05-19 DIAGNOSIS — Y832 Surgical operation with anastomosis, bypass or graft as the cause of abnormal reaction of the patient, or of later complication, without mention of misadventure at the time of the procedure: Secondary | ICD-10-CM | POA: Diagnosis present

## 2017-05-19 DIAGNOSIS — R12 Heartburn: Secondary | ICD-10-CM | POA: Diagnosis not present

## 2017-05-19 DIAGNOSIS — D6862 Lupus anticoagulant syndrome: Secondary | ICD-10-CM | POA: Diagnosis present

## 2017-05-19 DIAGNOSIS — R42 Dizziness and giddiness: Secondary | ICD-10-CM | POA: Diagnosis present

## 2017-05-19 DIAGNOSIS — Z90710 Acquired absence of both cervix and uterus: Secondary | ICD-10-CM | POA: Diagnosis not present

## 2017-05-19 DIAGNOSIS — Z791 Long term (current) use of non-steroidal anti-inflammatories (NSAID): Secondary | ICD-10-CM | POA: Diagnosis not present

## 2017-05-19 DIAGNOSIS — R5381 Other malaise: Secondary | ICD-10-CM | POA: Diagnosis not present

## 2017-05-19 DIAGNOSIS — R5383 Other fatigue: Secondary | ICD-10-CM | POA: Diagnosis not present

## 2017-05-19 DIAGNOSIS — R531 Weakness: Secondary | ICD-10-CM | POA: Diagnosis not present

## 2017-05-19 DIAGNOSIS — C801 Malignant (primary) neoplasm, unspecified: Secondary | ICD-10-CM | POA: Diagnosis not present

## 2017-05-19 DIAGNOSIS — Z980 Intestinal bypass and anastomosis status: Secondary | ICD-10-CM | POA: Diagnosis not present

## 2017-05-19 HISTORY — DX: Malignant (primary) neoplasm, unspecified: C80.1

## 2017-05-19 LAB — COMPREHENSIVE METABOLIC PANEL
ALT: 13 U/L — ABNORMAL LOW (ref 14–54)
AST: 29 U/L (ref 15–41)
Albumin: 2.6 g/dL — ABNORMAL LOW (ref 3.5–5.0)
Alkaline Phosphatase: 77 U/L (ref 38–126)
Anion gap: 11 (ref 5–15)
BUN: 51 mg/dL — ABNORMAL HIGH (ref 6–20)
CO2: 23 mmol/L (ref 22–32)
Calcium: 8.2 mg/dL — ABNORMAL LOW (ref 8.9–10.3)
Chloride: 95 mmol/L — ABNORMAL LOW (ref 101–111)
Creatinine, Ser: 1.94 mg/dL — ABNORMAL HIGH (ref 0.44–1.00)
GFR calc Af Amer: 26 mL/min — ABNORMAL LOW (ref >60)
GFR calc non Af Amer: 22 mL/min — ABNORMAL LOW (ref >60)
Glucose, Bld: 116 mg/dL — ABNORMAL HIGH (ref 65–99)
Potassium: 5.1 mmol/L (ref 3.5–5.1)
Sodium: 129 mmol/L — ABNORMAL LOW (ref 135–145)
Total Bilirubin: 0.5 mg/dL (ref 0.3–1.2)
Total Protein: 6.3 g/dL — ABNORMAL LOW (ref 6.5–8.1)

## 2017-05-19 LAB — CBC
HCT: 19.1 % — ABNORMAL LOW (ref 35.0–47.0)
Hemoglobin: 6.3 g/dL — ABNORMAL LOW (ref 12.0–16.0)
MCH: 28.2 pg (ref 26.0–34.0)
MCHC: 32.8 g/dL (ref 32.0–36.0)
MCV: 86.2 fL (ref 80.0–100.0)
Platelets: 497 10*3/uL — ABNORMAL HIGH (ref 150–440)
RBC: 2.22 MIL/uL — ABNORMAL LOW (ref 3.80–5.20)
RDW: 15 % — ABNORMAL HIGH (ref 11.5–14.5)
WBC: 12.6 10*3/uL — ABNORMAL HIGH (ref 3.6–11.0)

## 2017-05-19 LAB — PROTIME-INR
INR: 1.06
Prothrombin Time: 13.7 seconds (ref 11.4–15.2)

## 2017-05-19 LAB — HEMOGLOBIN AND HEMATOCRIT, BLOOD
HCT: 21.2 % — ABNORMAL LOW (ref 35.0–47.0)
Hemoglobin: 7.3 g/dL — ABNORMAL LOW (ref 12.0–16.0)

## 2017-05-19 LAB — PREPARE RBC (CROSSMATCH)

## 2017-05-19 LAB — APTT: aPTT: 61 seconds — ABNORMAL HIGH (ref 24–36)

## 2017-05-19 MED ORDER — POLYETHYLENE GLYCOL 3350 17 G PO PACK: 17.0000 g | PACK | Freq: Every day | ORAL | Status: DC

## 2017-05-19 MED ORDER — POLYETHYLENE GLYCOL 3350 17 G PO PACK
17.0000 g | PACK | Freq: Every day | ORAL | Status: DC | PRN
  Administered 2017-05-19: 23:00:00 17 g via ORAL
  Filled 2017-05-19: qty 1

## 2017-05-19 MED ORDER — SODIUM CHLORIDE 0.9 % IV SOLN
INTRAVENOUS | Status: DC
  Administered 2017-05-19 (×2): via INTRAVENOUS

## 2017-05-19 MED ORDER — DOCUSATE SODIUM 100 MG PO CAPS: 100.0000 mg | ORAL_CAPSULE | Freq: Two times a day (BID) | ORAL | Status: DC | PRN

## 2017-05-19 MED ORDER — CYCLOBENZAPRINE HCL 10 MG PO TABS
10.0000 mg | ORAL_TABLET | Freq: Every day | ORAL | Status: DC
  Administered 2017-05-19 – 2017-05-21 (×3): 10 mg via ORAL
  Filled 2017-05-19 (×3): qty 1

## 2017-05-19 MED ORDER — SODIUM CHLORIDE 0.9 % IV SOLN
10.0000 mL/h | Freq: Once | INTRAVENOUS | Status: AC
  Administered 2017-05-19: 10 mL/h via INTRAVENOUS

## 2017-05-19 MED ORDER — POLYVINYL ALCOHOL 1.4 % OP SOLN
1.0000 [drp] | Freq: Three times a day (TID) | OPHTHALMIC | Status: DC | PRN
  Filled 2017-05-19: qty 15

## 2017-05-19 MED ORDER — INFLUENZA VAC SPLIT HIGH-DOSE 0.5 ML IM SUSY
0.5000 mL | PREFILLED_SYRINGE | INTRAMUSCULAR | Status: DC
  Filled 2017-05-19: qty 0.5

## 2017-05-19 MED ORDER — FLUTICASONE FUROATE-VILANTEROL 100-25 MCG/INH IN AEPB
1.0000 | INHALATION_SPRAY | Freq: Every day | RESPIRATORY_TRACT | Status: DC
  Administered 2017-05-19 – 2017-05-21 (×3): 1 via RESPIRATORY_TRACT
  Filled 2017-05-19 (×2): qty 28

## 2017-05-19 MED ORDER — IPRATROPIUM-ALBUTEROL 0.5-2.5 (3) MG/3ML IN SOLN: 3.0000 mL | Freq: Four times a day (QID) | RESPIRATORY_TRACT | Status: DC | PRN

## 2017-05-19 MED ORDER — MONTELUKAST SODIUM 10 MG PO TABS
10.0000 mg | ORAL_TABLET | Freq: Every day | ORAL | Status: DC
  Administered 2017-05-19 – 2017-05-21 (×3): 10 mg via ORAL
  Filled 2017-05-19 (×3): qty 1

## 2017-05-19 MED ORDER — SODIUM CHLORIDE 0.9 % IV SOLN
Freq: Once | INTRAVENOUS | Status: AC
  Administered 2017-05-19: 18:00:00 via INTRAVENOUS

## 2017-05-19 MED ORDER — MECLIZINE HCL 25 MG PO TABS
25.0000 mg | ORAL_TABLET | Freq: Three times a day (TID) | ORAL | Status: DC | PRN
  Filled 2017-05-19: qty 1

## 2017-05-19 MED ORDER — ACETAMINOPHEN 500 MG PO TABS
500.0000 mg | ORAL_TABLET | Freq: Two times a day (BID) | ORAL | Status: DC | PRN
  Administered 2017-05-19 – 2017-05-21 (×3): 500 mg via ORAL
  Filled 2017-05-19 (×3): qty 1

## 2017-05-19 NOTE — Progress Notes (Signed)
She was admitted to the hospital after one blood transfusion.  She does feel stronger now.  She was ambulated to the commode and did pass a blood clot since admission.  She is sipping some liquids.  Her abdominal x-ray demonstrated some small and large bowel gas but not an obstructive pattern.  There appears to be stool within the colon.  Her hemoglobin after 1 blood transfusion was 7.3.  PTT is 61 seconds.  Prothrombin time 13.7 seconds with INR of 1.06.  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 Grayland Ormond review this.  Plan to ambulate after  second blood transfusion.

## 2017-05-19 NOTE — ED Notes (Signed)
Patient signed consent for blood transfusion after Dr. Jimmye Norman and Dr. Tamala Julian explained risks and benefits of procedure.

## 2017-05-19 NOTE — Progress Notes (Signed)
Family Meeting Note  Advance Directive:yes  Today a meeting took place with the Patient and son, sister-in-law.  The following clinical team members were present during this meeting:MD  The following were discussed:Patient's diagnosis: metastatic malignancy, Blood loss anemia , Patient's progosis: < 12 months and Goals for treatment: Full Code  Discussed with pt, she have new diagnosis of cancer. She will like to talk with oncologist about options and if so much side effects- may not like to have chemo. She will like to have all interventions, if cardiac arrest- but not to kept alive on machines , if no recovery.  Additional follow-up to be provided: Oncology and palliative care.  Time spent during discussion:20 minutes  Treyvin Glidden, Rosalio Macadamia, MD

## 2017-05-19 NOTE — Progress Notes (Signed)
   05/19/17 1800  Clinical Encounter Type  Visited With Patient;Family;Patient and family together  Visit Type Initial;Other (Comment) (HCPOA)  Referral From Nurse  Spiritual Encounters  Spiritual Needs Literature  HCPOA/AD materials dropped off with patient.  [address] available to review as needed.

## 2017-05-19 NOTE — ED Notes (Signed)
Dr. Rochel Brome in room to assess patient at this time.  Will continue to monitor.

## 2017-05-19 NOTE — ED Notes (Signed)
Called [phone], gave report to [facility]

## 2017-05-19 NOTE — H&P (Signed)
[place] [facility] [place] NAME: Pria Klosinski    MR#:  [id number]  DATE OF BIRTH:  12/12/1927  DATE OF ADMISSION:  05/19/2017  PRIMARY CARE PHYSICIAN: [name], MD   REQUESTING/REFERRING PHYSICIAN: Jimmye Norman  CHIEF COMPLAINT:   Chief Complaint  Patient presents with  . GI Bleeding    HISTORY OF PRESENT ILLNESS: Blimy Napoleon  is a 83 y.o. female with a known history of a recent problem with symptomatic rectal prolapse. She also has a history of chronic anemia. She had surgery last week with excision of prolapsed rectum. Postoperatively she did have some rectal bleeding and acute anemia and did have 2 blood transfusions. She also had pathology findings of malignant cells on the external surface of the rectum. 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  She was discharged after several days in the hospital. She was on her way to the office for follow-up this morning when she felt dizzy and was brought into the emergency room. Initial blood pressure was 96/46. She reports feeling dizzy and weak. She also notes that she has had some bowel movements passing some blood with her bowel movements. She also has had nausea and has had minimal degree of vomiting including some bile-stained fluid. She reports having a headache in the right frontal area. She reports breathing satisfactorily. She reports some abdominal distention. He does have some chronic mild difficulty emptying her bladder.  2 units PRBC ordered, Noted to be in Ac renal failure.  PAST MEDICAL HISTORY:   Past Medical History:  Diagnosis Date  . Anemia   . Arthritis   . Asthma   . Cancer ([place])   . Dyspnea   . GERD (gastroesophageal reflux disease)   . Hypertension   . Sjogren's disease ([place])     PAST SURGICAL HISTORY: Past Surgical History:  Procedure Laterality Date  . ABDOMINAL HYSTERECTOMY    . BREAST BIOPSY     x6  . BUNIONECTOMY  Bilateral   . CATARACT EXTRACTION, BILATERAL    . INCONTINENCE SURGERY    . REPAIR OF RECTAL PROLAPSE N/A 05/11/2017   Procedure: REPAIR OF RECTAL PROLAPSE;  Surgeon: Leonie Green, MD;  Location: [facility] ORS;  Service: General;  Laterality: N/A;  . TONSILLECTOMY      SOCIAL HISTORY:  Social History  Substance Use Topics  . Smoking status: Never Smoker  . Smokeless tobacco: Never Used  . Alcohol use No    FAMILY HISTORY:  Family History  Problem Relation Age of Onset  . Heart disease Mother   . Heart disease Father     DRUG ALLERGIES: No Known Allergies  REVIEW OF SYSTEMS:   CONSTITUTIONAL: No fever,positive for fatigue or weakness.  EYES: No blurred or double vision.  EARS, NOSE, AND THROAT: No tinnitus or ear pain.  RESPIRATORY: No cough, shortness of breath, wheezing or hemoptysis.  CARDIOVASCULAR: No chest pain, orthopnea, edema.  GASTROINTESTINAL: No nausea, vomiting, diarrhea or abdominal pain. Have blood in stool. GENITOURINARY: No dysuria, hematuria.  ENDOCRINE: No polyuria, nocturia,  HEMATOLOGY: No anemia, easy bruising or bleeding SKIN: No rash or lesion. MUSCULOSKELETAL: No joint pain or arthritis.   NEUROLOGIC: No tingling, numbness, weakness.  PSYCHIATRY: No anxiety or depression.   MEDICATIONS AT HOME:  Prior to Admission medications   Medication Sig Start Date End Date Taking? Authorizing Provider  acetaminophen (TYLENOL) 500 MG tablet Take 500 mg by mouth 2 (two) times daily as needed  for moderate pain.    Yes [provider]  albuterol (PROVENTIL HFA;VENTOLIN HFA) 108 (90 Base) MCG/ACT inhaler Inhale 1-2 puffs into the lungs every 6 (six) hours as needed for wheezing or shortness of breath.   Yes [provider]  amLODipine (NORVASC) 5 MG tablet Take 5 mg by mouth daily with breakfast.   Yes [provider]  Ascorbic Acid (VITAMIN C) 1000 MG tablet Take 1,000 mg by mouth daily.   Yes [provider]  Calcium  Carb-Cholecalciferol (CALTRATE 600+D) 600-800 MG-UNIT TABS Take 1 tablet by mouth daily.   Yes [provider]  Carboxymeth-Glyc-Polysorb PF (REFRESH OPTIVE ADVANCED PF) 0.5-1-0.5 % SOLN Place 1-2 drops into both eyes 3 (three) times daily as needed. For dry, irritated eyes   Yes [provider]  Carboxymethylcellulose Sod PF (REFRESH CELLUVISC) 1 % GEL Place 1 drop into both eyes at bedtime. In the middle of the night    Yes [provider]  cetirizine (ZYRTEC) 10 MG tablet Take 10 mg by mouth at bedtime as needed for allergies.   Yes [provider]  Cholecalciferol (VITAMIN D3) 2000 units capsule Take 2,000 Units by mouth daily.   Yes [provider]  Cyanocobalamin (RA VITAMIN B12) 2000 MCG TBCR Take 2,000 mcg by mouth daily.   Yes [provider]  cyclobenzaprine (FLEXERIL) 10 MG tablet Take 10 mg by mouth at bedtime.   Yes [provider]  DHA-EPA-Flaxseed Oil-Vitamin E (THERA TEARS NUTRITION PO) Take 2 tablets by mouth daily.   Yes [provider]  docusate sodium (COLACE) 100 MG capsule Take 100-300 mg by mouth at bedtime as needed for mild constipation.   Yes [provider]  esomeprazole (NEXIUM) 40 MG capsule Take 40 mg by mouth daily.    Yes [provider]  fluticasone furoate-vilanterol (BREO ELLIPTA) 100-25 MCG/INH AEPB Inhale 1 puff into the lungs at bedtime.   Yes [provider]  furosemide (LASIX) 40 MG tablet Take 20 mg by mouth daily with breakfast.    Yes [provider]  Glucosamine-MSM-Hyaluronic Acd ([place]) Take 1 tablet by mouth daily.   Yes [provider]  hypromellose (SYSTANE OVERNIGHT THERAPY) 0.3 % GEL ophthalmic ointment Place 1 application into both eyes at bedtime.   Yes [provider]  ipratropium-albuterol (DUONEB) 0.5-2.5 (3) MG/3ML SOLN Take 3 mLs by nebulization every 6 (six) hours as needed. For wheezing/shortness of breath    Yes [provider]  losartan (COZAAR) 100 MG tablet Take 100 mg by mouth at bedtime.   Yes [provider]  meclizine (ANTIVERT) 25 MG tablet Take 25 mg by mouth 3 (three) times daily as needed for dizziness.    Yes [provider]  meloxicam (MOBIC) 15 MG tablet Take 15 mg by mouth daily with breakfast.   Yes [provider]  montelukast (SINGULAIR) 10 MG tablet Take 10 mg by mouth at bedtime.   Yes [provider]  triamcinolone cream (KENALOG) 0.5 % Apply 1 application topically as needed. For inflammation   Yes [provider]      PHYSICAL EXAMINATION:   VITAL SIGNS: Blood pressure 113/62, pulse 88, temperature 97.7 F (36.5 C), temperature source Oral, resp. rate 16, height 4\' 9"  (1.448 m), weight 45.4 kg (100 lb), SpO2 98 %.  GENERAL:  83 y.o.-year-old patient lying in the bed with no acute distress.  EYES: Pupils equal, round, reactive to light and accommodation. No scleral icterus. Extraocular muscles intact.  Conjunctiva pale. HEENT: Head atraumatic, normocephalic. Oropharynx and nasopharynx clear.  NECK:  Supple, no jugular venous distention. No thyroid enlargement, no tenderness.  LUNGS: Normal breath sounds bilaterally, no wheezing, rales,rhonchi or crepitation. No use of accessory muscles of respiration.  CARDIOVASCULAR: S1, S2 normal. No murmurs, rubs, or gallops.  ABDOMEN: Soft, mild tender and distended. Bowel sounds present. No organomegaly or mass.  EXTREMITIES: No pedal edema, cyanosis, or clubbing.  NEUROLOGIC: Cranial nerves II through XII are intact. Muscle strength 3-4/5 in all extremities. Sensation intact. Gait not checked.  PSYCHIATRIC: The patient is alert and oriented x 3.  SKIN: No obvious rash, lesion, or ulcer.   LABORATORY PANEL:   CBC  Recent Labs Lab 05/12/17 1644 05/13/17 0415 05/13/17 2104 05/14/17 0356 05/15/17 0404 05/19/17 1124  WBC  --   --   --   --   --  12.6*  HGB 7.9* 7.1* 9.5*  9.1* 9.1* 6.3*  HCT 23.2* 20.8*  --  26.1* 25.9* 19.1*  PLT  --   --   --   --   --  497*  MCV  --   --   --   --   --  86.2  MCH  --   --   --   --   --  28.2  MCHC  --   --   --   --   --  32.8  RDW  --   --   --   --   --  15.0*   ------------------------------------------------------------------------------------------------------------------  Chemistries   Recent Labs Lab 05/14/17 0356 05/15/17 0404 05/19/17 1124  NA 130* 131* 129*  K 4.6 4.7 5.1  CL 99* 98* 95*  CO2 25 26 23   GLUCOSE 96 88 116*  BUN 31* 32* 51*  CREATININE 1.15* 1.22* 1.94*  CALCIUM 7.9* 8.3* 8.2*  AST  --   --  29  ALT  --   --  13*  ALKPHOS  --   --  77  BILITOT  --   --  0.5   ------------------------------------------------------------------------------------------------------------------ estimated creatinine clearance is 12.4 mL/min (A) (by C-G formula based on SCr of 1.94 mg/dL (H)). ------------------------------------------------------------------------------------------------------------------ No results for input(s): TSH, T4TOTAL, T3FREE, THYROIDAB in the last 72 hours.  Invalid input(s): FREET3   Coagulation profile No results for input(s): INR, PROTIME in the last 168 hours. ------------------------------------------------------------------------------------------------------------------- No results for input(s): DDIMER in the last 72 hours. -------------------------------------------------------------------------------------------------------------------  Cardiac Enzymes  Recent Labs Lab 05/14/17 0356  TROPONINI <0.03   ------------------------------------------------------------------------------------------------------------------ Invalid input(s): POCBNP  ---------------------------------------------------------------------------------------------------------------  Urinalysis No results found for: COLORURINE, APPEARANCEUR, LABSPEC, PHURINE, GLUCOSEU, HGBUR, BILIRUBINUR,  KETONESUR, PROTEINUR, UROBILINOGEN, NITRITE, LEUKOCYTESUR   RADIOLOGY: No results found.  EKG: Orders placed or performed during the hospital encounter of 05/19/17  . ED EKG  . ED EKG  . EKG 12-Lead  . EKG 12-Lead    IMPRESSION AND PLAN:  * Symptomatic anemia    Due to blood loss.    Recent sx, rectal exam have mucous + blood as per Dr. Rochel Brome.   2 units PRBC tx, Hold Anticoagulants  * Ac renal failure   Dehydrations   Hold BP meds and lasix   IV fluids and blood tx, monitor.  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   Called Oncology consult to discuss the prognosis.   Palliative care consult, as pt told me, she may not like to have chemo due to lot of side  effects.  * Hyperkalemia    Due to renal failure    Monitor now, with IV fluids.  All the records are reviewed and case discussed with ED provider. Management plans discussed with the patient, family and they are in agreement.  CODE STATUS: Full code. Code Status History    Date Active Date Inactive Code Status Order ID Comments User Context   05/14/2017  1:41 PM 05/15/2017  7:39 PM DNR [id number]  Hillary Bow, MD Inpatient   05/11/2017  2:37 PM 05/14/2017  1:41 PM Full Code [id number]  Leonie Green, MD Inpatient    Questions for Most Recent Historical Code Status (Order [id number])    Question Answer Comment   In the event of cardiac or respiratory ARREST Do not call a "code blue"    In the event of cardiac or respiratory ARREST Do not perform Intubation, CPR, defibrillation or ACLS    In the event of cardiac or respiratory ARREST Use medication by any route, position, wound care, and other measures to relive pain and suffering. May use oxygen, suction and manual treatment of airway obstruction as needed for comfort.         Advance Directive Documentation     Most Recent Value  Type of Advance Directive  Living will  Pre-existing out of facility DNR order  (yellow form or pink MOST form)  -  "MOST" Form in Place?  -     Discussed with son and sister-in-law I room.  TOTAL TIME TAKING CARE OF THIS PATIENT: 50 minutes.    Vaughan Basta M.D on 05/19/2017   Between 7am to 6pm - Pager - [phone]  After 6pm go to [email or web address] - password [email or web address] [facility]  Office  [phone]  CC: Primary care physician; [name], MD   Note: This dictation was prepared with Dragon dictation along with smaller phrase technology. Any transcriptional errors that result from this process are unintentional.

## 2017-05-19 NOTE — H&P (Signed)
Tamara Mckinney is an 83 y.o. female.   Chief Complaint: dizziness HPI: she has had a recent problem with symptomatic rectal prolapse. She also has a history of chronic anemia. She had surgery last week with excision of prolapsed rectum. Postoperatively she did have some rectal bleeding and acute anemia and did have 2 blood transfusions. She also had pathology findings of malignant cells on the external surface of the rectum. 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  She was discharged after several days in the hospital. She was on her way to the office for follow-up this morning when she felt dizzy and was brought into the emergency room. Initial blood pressure was 96/46. She reports feeling dizzy and weak. She also notes that she has had some bowel movements passing some blood with her bowel movements. She also has had nausea and has had minimal degree of vomiting including some bile-stained fluid. She reports having a headache in the right frontal area. She reports breathing satisfactorily. She reports some abdominal distention. He does have some chronic mild difficulty emptying her bladder.  Past Medical History:  Diagnosis Date  . Anemia   . Arthritis   . Asthma   . Dyspnea   . GERD (gastroesophageal reflux disease)   . Hypertension   . Sjogren's disease [facility])     Past Surgical History:  Procedure Laterality Date  . ABDOMINAL HYSTERECTOMY    . BREAST BIOPSY     x6  . BUNIONECTOMY Bilateral   . CATARACT EXTRACTION, BILATERAL    . INCONTINENCE SURGERY    . REPAIR OF RECTAL PROLAPSE N/A 05/11/2017   Procedure: REPAIR OF RECTAL PROLAPSE;  Surgeon: Leonie Green, MD;  Location: [facility] ORS;  Service: General;  Laterality: N/A;  . TONSILLECTOMY      Family History  Problem Relation Age of Onset  . Heart disease Mother   . Heart disease Father    Social History:  reports that she has never smoked. She has never used smokeless  tobacco. She reports that she does not drink alcohol or use drugs.   MEDICINES:albuterol (PROAIR HFA) 90 mcg/actuation inhaler Inhale 2 inhalations into the lungs every 6 (six) hours as needed for Wheezing. 1 Inhaler 1  . amLODIPine (NORVASC) 5 MG tablet TAKE ONE TABLET BY MOUTH DAILY 30 tablet 4  . ascorbic acid (VITAMIN C) 500 MG tablet Take 500 mg by mouth 2 (two) times daily.   Marland Kitchen BREO ELLIPTA 100-25 mcg/dose DsDv inhaler INHALE ONE DOSE BY MOUTH INTO THE LUNGS DAILY 60 each 2  . calcium carbonate-vitamin D3 (CALCIUM-VITAMIN D) 500 mg(1,241m) -200 unit tablet Take by mouth.  . CARBOXYMETHYLCELLULOSE SODIUM (REFRESH CELLUVISC OPHTH) Apply to eye nightly. Reported on 10/11/2015  . cetirizine (ZYRTEC) 10 MG tablet Take 10 mg by mouth once daily. Reported on 10/11/2015  . cholecalciferol (CHOLECALCIFEROL) 1,000 unit tablet Take 2,000 Units by mouth once daily.  . cyclobenzaprine (FLEXERIL) 10 MG tablet Take 1 tablet (10 mg total) by mouth 2 (two) times daily as needed for Muscle spasms. 60 tablet 2  . esomeprazole (NEXIUM) 40 MG DR capsule Take 1 capsule (40 mg total) by mouth once daily. 30 capsule 11  . FIBER, DEXTRIN, ORAL Take by mouth.  . FUROsemide (LASIX) 40 MG tablet Take 1/2 tablet (20 mg total) by mouth once daily. 30 tablet 4  . losartan (COZAAR) 100 MG tablet Take 1 tablet (100 mg total) by mouth once daily. 90 tablet 1  .  meclizine (ANTIVERT) 25 mg tablet Take 1 tablet (25 mg total) by mouth 3 (three) times daily as needed for Dizziness. 30 tablet 2  . meloxicam (MOBIC) 15 MG tablet Take 1 tablet (15 mg total) by mouth once daily. 30 tablet 11  . montelukast (SINGULAIR) 10 mg tablet Take 1 tablet (10 mg total) by mouth once daily. 30 tablet 5  . MULTIVITAMIN ORAL Take 1 tablet by mouth once daily. Reported on 10/11/2015  . PROLENSA 0.07 % ophthalmic solution  . raloxifene (EVISTA) 60 mg tablet TAKE 1 TABLET (60 MG TOTAL) BY MOUTH ONCE DAILY. 30 tablet 4  . triamcinolone 0.5 % cream  Apply topically 2 (two) times daily. 30 g 0    Allergies: No Known Allergies   (Not in a hospital admission)  Results for orders placed or performed during the hospital encounter of 05/19/17 (from the past 48 hour(s))  Comprehensive metabolic panel     Status: Abnormal   Collection Time: 05/19/17 11:24 AM  Result Value Ref Range   Sodium 129 (L) 135 - 145 mmol/L   Potassium 5.1 3.5 - 5.1 mmol/L   Chloride 95 (L) 101 - 111 mmol/L   CO2 23 22 - 32 mmol/L   Glucose, Bld 116 (H) 65 - 99 mg/dL   BUN 51 (H) 6 - 20 mg/dL   Creatinine, Ser 1.94 (H) 0.44 - 1.00 mg/dL   Calcium 8.2 (L) 8.9 - 10.3 mg/dL   Total Protein 6.3 (L) 6.5 - 8.1 g/dL   Albumin 2.6 (L) 3.5 - 5.0 g/dL   AST 29 15 - 41 U/L   ALT 13 (L) 14 - 54 U/L   Alkaline Phosphatase 77 38 - 126 U/L   Total Bilirubin 0.5 0.3 - 1.2 mg/dL   GFR calc non Af Amer 22 (L) >60 mL/min   GFR calc Af Amer 26 (L) >60 mL/min    Comment: (NOTE) The eGFR has been calculated using the CKD EPI equation. This calculation has not been validated in all clinical situations. eGFR's persistently <60 mL/min signify possible Chronic Kidney Disease.    Anion gap 11 5 - 15  CBC     Status: Abnormal   Collection Time: 05/19/17 11:24 AM  Result Value Ref Range   WBC 12.6 (H) 3.6 - 11.0 K/uL   RBC 2.22 (L) 3.80 - 5.20 MIL/uL   Hemoglobin 6.3 (L) 12.0 - 16.0 g/dL   HCT 19.1 (L) 35.0 - 47.0 %   MCV 86.2 80.0 - 100.0 fL   MCH 28.2 26.0 - 34.0 pg   MCHC 32.8 32.0 - 36.0 g/dL   RDW 15.0 (H) 11.5 - 14.5 %   Platelets 497 (H) 150 - 440 K/uL  Type and screen [facility]     Status: None (Preliminary result)   Collection Time: 05/19/17 11:24 AM  Result Value Ref Range   ABO/RH(D) PENDING    Antibody Screen PENDING    Sample Expiration 05/22/2017   Prepare RBC     Status: None (Preliminary result)   Collection Time: 05/19/17 12:30 PM  Result Value Ref Range   Order Confirmation PENDING    No results found.  Blood pressure (!)  96/49, pulse 99, temperature 97.9 F (36.6 C), temperature source Oral, resp. rate 18, height _0  (1.448 m), weight 100 lb (45.4 kg), SpO2 100 %.  Physical Exam: GENERAL: She is awake and alert and oriented. She is on the emergency room stretcher and appears pale   HEENT:  Head  is normocephalic.  Pupils are equal reactive to light.  Extraocular movements are intact. Sclera is clear.  Pharynx is clear.She has a partial plate on the bottom which has a wire which is irritating her tongue  SKIN: Pale warm and dry without rash.  NECK:  Supple with no palpable mass and no adenopathy.  HEART:  Regular rhythm S1-S2, withgrade 2 systolic murmurheard best at the left parasternal area.Marland Kitchen  LUNGS:  Clear without rales rhonchi or wheezes.  ABDOMEN: Moderately distended with visible loops of bowel and tympany. There is palpable mass in the left mid abdomen which is mildly tender.  RECTUM: There is hypotonic sphincter tone there is a palpable staple line from recent surgery. There is a small amount of bloodstained mucus seen on examining finger.  NEUROLOGIC:  Awake alert and moving all extremities.  EXTREMITIES:  Well-developed well-nourished with milddependent edema. 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 Plan two blood transfusions.  I discussed this with the patient and family and emergency room physician.  Rochel Brome, MD 05/19/2017, 12:23 PM

## 2017-05-19 NOTE — ED Triage Notes (Signed)
Pt had surgery for rectal prolapse X 1 week ago. C/o dark red bleeding from rectum constant since the surgery. Pt appears pale and fatigued. Pt reports near syncope at home. No falls. Alert and oriented X 4 at this time. RR even and unlabored.

## 2017-05-20 DIAGNOSIS — R12 Heartburn: Secondary | ICD-10-CM

## 2017-05-20 DIAGNOSIS — C786 Secondary malignant neoplasm of retroperitoneum and peritoneum: Secondary | ICD-10-CM

## 2017-05-20 DIAGNOSIS — Z79899 Other long term (current) drug therapy: Secondary | ICD-10-CM

## 2017-05-20 DIAGNOSIS — M1990 Unspecified osteoarthritis, unspecified site: Secondary | ICD-10-CM

## 2017-05-20 DIAGNOSIS — R5381 Other malaise: Secondary | ICD-10-CM

## 2017-05-20 DIAGNOSIS — K625 Hemorrhage of anus and rectum: Secondary | ICD-10-CM

## 2017-05-20 DIAGNOSIS — I10 Essential (primary) hypertension: Secondary | ICD-10-CM

## 2017-05-20 DIAGNOSIS — Z980 Intestinal bypass and anastomosis status: Secondary | ICD-10-CM

## 2017-05-20 DIAGNOSIS — Z9889 Other specified postprocedural states: Secondary | ICD-10-CM

## 2017-05-20 DIAGNOSIS — D500 Iron deficiency anemia secondary to blood loss (chronic): Secondary | ICD-10-CM

## 2017-05-20 DIAGNOSIS — R5383 Other fatigue: Secondary | ICD-10-CM

## 2017-05-20 DIAGNOSIS — K219 Gastro-esophageal reflux disease without esophagitis: Secondary | ICD-10-CM

## 2017-05-20 DIAGNOSIS — Z90710 Acquired absence of both cervix and uterus: Secondary | ICD-10-CM

## 2017-05-20 DIAGNOSIS — R531 Weakness: Secondary | ICD-10-CM

## 2017-05-20 DIAGNOSIS — K922 Gastrointestinal hemorrhage, unspecified: Secondary | ICD-10-CM

## 2017-05-20 DIAGNOSIS — C801 Malignant (primary) neoplasm, unspecified: Secondary | ICD-10-CM

## 2017-05-20 DIAGNOSIS — K623 Rectal prolapse: Secondary | ICD-10-CM

## 2017-05-20 LAB — CBC
HCT: 25.4 % — ABNORMAL LOW (ref 35.0–47.0)
Hemoglobin: 9 g/dL — ABNORMAL LOW (ref 12.0–16.0)
MCH: 30.1 pg (ref 26.0–34.0)
MCHC: 35.6 g/dL (ref 32.0–36.0)
MCV: 84.6 fL (ref 80.0–100.0)
Platelets: 436 10*3/uL (ref 150–440)
RBC: 3 MIL/uL — ABNORMAL LOW (ref 3.80–5.20)
RDW: 14.5 % (ref 11.5–14.5)
WBC: 14.6 10*3/uL — ABNORMAL HIGH (ref 3.6–11.0)

## 2017-05-20 LAB — BASIC METABOLIC PANEL
Anion gap: 12 (ref 5–15)
BUN: 43 mg/dL — ABNORMAL HIGH (ref 6–20)
CO2: 23 mmol/L (ref 22–32)
Calcium: 8.3 mg/dL — ABNORMAL LOW (ref 8.9–10.3)
Chloride: 98 mmol/L — ABNORMAL LOW (ref 101–111)
Creatinine, Ser: 1.44 mg/dL — ABNORMAL HIGH (ref 0.44–1.00)
GFR calc Af Amer: 37 mL/min — ABNORMAL LOW (ref >60)
GFR calc non Af Amer: 32 mL/min — ABNORMAL LOW (ref >60)
Glucose, Bld: 79 mg/dL (ref 65–99)
Potassium: 4.4 mmol/L (ref 3.5–5.1)
Sodium: 133 mmol/L — ABNORMAL LOW (ref 135–145)

## 2017-05-20 LAB — PREPARE FRESH FROZEN PLASMA: Unit division: 0

## 2017-05-20 LAB — BPAM FFP
Blood Product Expiration Date: 201810082359
ISSUE DATE / TIME: 201810032212
Unit Type and Rh: 6200

## 2017-05-20 LAB — MAGNESIUM: Magnesium: 2.2 mg/dL (ref 1.7–2.4)

## 2017-05-20 MED ORDER — PANTOPRAZOLE SODIUM 40 MG PO TBEC
40.0000 mg | DELAYED_RELEASE_TABLET | Freq: Every day | ORAL | Status: DC
  Administered 2017-05-20 – 2017-05-22 (×3): 40 mg via ORAL
  Filled 2017-05-20 (×3): qty 1

## 2017-05-20 MED ORDER — POLYETHYLENE GLYCOL 3350 17 G PO PACK: 34.0000 g | PACK | Freq: Two times a day (BID) | ORAL | Status: DC

## 2017-05-20 MED ORDER — POLYETHYLENE GLYCOL 3350 17 G PO PACK
17.0000 g | PACK | Freq: Two times a day (BID) | ORAL | Status: DC
  Administered 2017-05-20 – 2017-05-22 (×5): 17 g via ORAL
  Filled 2017-05-20 (×5): qty 1

## 2017-05-20 NOTE — Plan of Care (Signed)
Problem: Activity: Goal: Risk for activity intolerance will decrease Outcome: Progressing Pt moved from bed to chair with one assist; PT evaluate pt.

## 2017-05-20 NOTE — Plan of Care (Signed)
Problem: Safety: Goal: Ability to remain free from injury will improve Outcome: Progressing Pt remained free of fall today

## 2017-05-20 NOTE — Progress Notes (Signed)
Medications administered by student RN [id number] with supervision of Clinical Instructor Jalen Oberry MSN, RN-BC or patient's assigned RN.   

## 2017-05-20 NOTE — Progress Notes (Signed)
[facility] NAME: Geniene List    MR#:  [id number]  DATE OF BIRTH:  1928/07/02  SUBJECTIVE:  Some rectal bleeding with small clotts Tolerating CLD Sons in the room  REVIEW OF SYSTEMS:   Review of Systems  Constitutional: Negative for chills, fever and weight loss.  HENT: Negative for ear discharge, ear pain and nosebleeds.   Eyes: Negative for blurred vision, pain and discharge.  Respiratory: Negative for sputum production, shortness of breath, wheezing and stridor.   Cardiovascular: Negative for chest pain, palpitations, orthopnea and PND.  Gastrointestinal: Negative for abdominal pain, diarrhea, nausea and vomiting.  Genitourinary: Negative for frequency and urgency.  Musculoskeletal: Negative for back pain and joint pain.  Neurological: Negative for sensory change, speech change, focal weakness and weakness.  Psychiatric/Behavioral: Negative for depression and hallucinations. The patient is not nervous/anxious.    Tolerating Diet:yes Tolerating PT: pending  DRUG ALLERGIES:  No Known Allergies  VITALS:  Blood pressure 116/60, pulse (!) 104, temperature 98.5 F (36.9 C), temperature source Oral, resp. rate 18, height 4\' 9"  (1.448 m), weight 47.5 kg (104 lb 12.8 oz), SpO2 96 %.  PHYSICAL EXAMINATION:   Physical Exam  GENERAL:  83 y.o.-year-old patient lying in the bed with no acute distress.  EYES: Pupils equal, round, reactive to light and accommodation. No scleral icterus. Extraocular muscles intact.  HEENT: Head atraumatic, normocephalic. Oropharynx and nasopharynx clear.  NECK:  Supple, no jugular venous distention. No thyroid enlargement, no tenderness.  LUNGS: Normal breath sounds bilaterally, no wheezing, rales, rhonchi. No use of accessory muscles of respiration.  CARDIOVASCULAR: S1, S2 normal. No murmurs, rubs, or gallops.  ABDOMEN: Soft, nontender, nondistended. Bowel sounds present. No organomegaly or  mass.  EXTREMITIES: No cyanosis, clubbing or edema b/l.    NEUROLOGIC: Cranial nerves II through XII are intact. No focal Motor or sensory deficits b/l.   PSYCHIATRIC:  patient is alert and oriented x 3.  SKIN: No obvious rash, lesion, or ulcer.   LABORATORY PANEL:  CBC  Recent Labs Lab 05/20/17 0600  WBC 14.6*  HGB 9.0*  HCT 25.4*  PLT 436    Chemistries   Recent Labs Lab 05/19/17 1124 05/20/17 0600  NA 129* 133*  K 5.1 4.4  CL 95* 98*  CO2 23 23  GLUCOSE 116* 79  BUN 51* 43*  CREATININE 1.94* 1.44*  CALCIUM 8.2* 8.3*  AST 29  --   ALT 13*  --   ALKPHOS 77  --   BILITOT 0.5  --    Cardiac Enzymes  Recent Labs Lab 05/14/17 0356  TROPONINI <0.03   RADIOLOGY:  Dg Abd 2 Views  Result Date: 05/19/2017 CLINICAL DATA:  History of surgery for rectal prolapse, near syncopal episode EXAM: ABDOMEN - 2 VIEW COMPARISON:  None. FINDINGS: Supine and upright views of the abdomen. Moderate hiatal hernia. No free air beneath the diaphragm. Nonobstructed gas pattern with large amount of stool in the colon. Surgical sutures in the region of the rectum. Calcified pelvic phleboliths. Scoliosis of the spine. IMPRESSION: 1. Nonobstructed bowel-gas pattern with large amount of stool in the colon. Negative for free air. 2. Moderate hiatal hernia. Electronically Signed   By: Donavan Foil M.D.   On: 05/19/2017 15:07   ASSESSMENT AND PLAN:  Naila Elizondo  is a 83 y.o. female with a known history of a recent problem with symptomatic rectal prolapse. She also has a history of chronic anemia. She had surgery  last week with excision of prolapsed rectum. Postoperatively she did have some rectal bleeding and acute anemia and did have 2 blood transfusions. She also had pathology findings of malignant cells on the external surface of the rectum. 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  * Symptomatic anemia due to rectal bleeding  -came in with Hgb  6.3---7.3--2 units BT---9.0 - Recent sx, rectal exam have mucous + blood as per Dr. Rochel Brome. -Hold Anticoagulants  * Acute renal failure   Dehydrations   Hold BP meds and lasix   IV fluids and blood tx, monitor.  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 Dr Grayland Ormond to see pt    * Hyperkalemia--resolved    Due to renal failure K 4.4  *DVT prophylaxis -SCD and TEDS Avoid antiplatelet agents due to rectal bleeding  Case discussed with Care Management/Social Worker. Management plans discussed with the patient, family and they are in agreement.  CODE STATUS:FULL  DVT Prophylaxis: SCD?TEDS  TOTAL TIME TAKING CARE OF THIS PATIENT: 30 minutes.  >50% time spent on counselling and coordination of care  POSSIBLE D/C IN 1-2 DAYS, DEPENDING ON CLINICAL CONDITION.  Note: This dictation was prepared with Dragon dictation along with smaller phrase technology. Any transcriptional errors that result from this process are unintentional.  Avani Sensabaugh M.D on 05/20/2017 at 11:49 AM  Between 7am to 6pm - Pager - [phone]  After 6pm go to [email or web address] - password [email or web address] [facility]  Office  [phone]  CC: Primary care physician; [name], MDPatient ID: [name], female   DOB: 11-Mar-1928, 83 y.o.   MRN: [id number]

## 2017-05-20 NOTE — Evaluation (Signed)
Physical Therapy Evaluation Patient Details Name: Tamara Mckinney MRN: [id number] DOB: February 16, 1928 Today's Date: 05/20/2017   History of Present Illness  Pt is a 83 y.o. female with PMH of anemia, asthma, GERD, cancer and HTN. Pt recently admitted 05/11/17 for excision of prolapsed rectum. 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  Clinical Impression  Prior to hospital admission on 05/11/17 for surgery, pt was living alone, but with her sons living next door and "on-call 24/7". Pt/family plan to have sister in-law stay with pt for assistance while she is home following d/c. Pt was independent with all ADLs, ambulated with a rollator within the home and drove a golf cart on the property to exercise her dog.  Currently pt is experiencing generalized weakness and balance deficits. Pt was able to perform bed mobility with supervision, transfers with min guard for safety. Ambulated 80 ft x2 with rollator and min guard for safety; required seated rest break on rollator after first 9ft d/t pain and SOB. Activity tolerance is limited secondary to pain within her abdomen (7/10 throughout session), SOB and mild dizziness upon standing and after activity. Vitals monitored throughout session; remained stable.  Pt would benefit from skilled PT to address noted impairments and functional limitations (see below for any additional details).  Upon hospital discharge, recommend pt discharge to home health PT.     Follow Up Recommendations Home health PT    Equipment Recommendations  None recommended by PT (pt has rollator)    Recommendations for Other Services       Precautions / Restrictions Precautions Precautions: Fall Restrictions Weight Bearing Restrictions: No      Mobility  Bed Mobility Overal bed mobility: Needs Assistance Bed Mobility: Sit to Supine       Sit to  supine: HOB elevated;Supervision   General bed mobility comments: supervision for safety  Transfers Overall transfer level: Needs assistance   Transfers: Sit to/from Stand Sit to Stand: Min guard         General transfer comment: min guard for safety; pt reports SOB and mild dizziness upon sitting/standing, decreased with few seconds rest in position  Ambulation/Gait Ambulation/Gait assistance: Min guard Ambulation Distance (Feet): 80 Feet (22ft x2) Assistive device: 4-wheeled walker Gait Pattern/deviations: WFL(Within Functional Limits) Gait velocity: decreased Gait velocity interpretation: Below normal speed for age/gender General Gait Details: reciprocal gait, heel strike pattern with slight toe out; seated rest break on rollator after 86ft d/t pain and SOB  Stairs            Wheelchair Mobility    Modified Rankin (Stroke Patients Only)       Balance Overall balance assessment: Needs assistance Sitting-balance support: Feet supported Sitting balance-Leahy Scale: Good Sitting balance - Comments: pt steady seated at EOB with feet supported, no UE support   Standing balance support: Bilateral upper extremity supported;During functional activity Standing balance-Leahy Scale: Fair Standing balance comment: requires rollator for BUE support while walking                             Pertinent Vitals/Pain Pain Assessment: 0-10 Pain Score: 7  Pain Location: Abdomen Pain Descriptors / Indicators: Discomfort;Heaviness Pain Intervention(s): Limited activity within patient's tolerance;Monitored during session;Patient requesting pain meds-RN notified;Relaxation    Home Living Family/patient expects to be discharged to:: Private residence Living Arrangements: Alone Available Help at Discharge: Family (  son lives next door, "on call 24/7"; pt/family plans to have sister in-law stay with her in home upon d/c) Type of Home: House Home Access: Stairs to  enter Entrance Stairs-Rails: None Entrance Stairs-Number of Steps: 1 Home Layout: One level Home Equipment: Cane - single point;Shower seat;Grab bars - toilet;Grab bars - tub/shower;Walker - 4 wheels (golf cart to drive on her property)      Prior Function Level of Independence: Independent with assistive device(s)         Comments: Uses a rollator for ambulation within the home/community; Independent with all ADLs prior to recent rectal surgery, including clooking, bathing, toileting; cleaning service to clean floors once per month; drives golf cart to walk her small dog     Hand Dominance        Extremity/Trunk Assessment   Upper Extremity Assessment Upper Extremity Assessment: Overall WFL for tasks assessed (at least 3/5 throughout)    Lower Extremity Assessment Lower Extremity Assessment: Overall WFL for tasks assessed (at least 3/5 throughout)    Cervical / Trunk Assessment Cervical / Trunk Assessment: Normal  Communication   Communication: No difficulties  Cognition Arousal/Alertness: Awake/alert Behavior During Therapy: WFL for tasks assessed/performed Overall Cognitive Status: Within Functional Limits for tasks assessed                                        General Comments General comments (skin integrity, edema, etc.): Pt reports her doctor recommends full body brace to help with pain, but has not gotten one yet; pt has hx of 1 fall within past year; frequent near-falls, but pt reports she catches herself on her rollator    Exercises     Assessment/Plan    PT Assessment Patient needs continued PT services  PT Problem List Decreased strength;Decreased activity tolerance;Decreased balance;Decreased mobility;Pain       PT Treatment Interventions Gait training;Stair training;Therapeutic activities;Therapeutic exercise;Balance training;Patient/family education    PT Goals (Current goals can be found in the Care Plan section)  Acute Rehab  PT Goals Patient Stated Goal: to be able to go home PT Goal Formulation: With patient Time For Goal Achievement: 06/03/17 Potential to Achieve Goals: Good    Frequency Min 2X/week   Barriers to discharge        Co-evaluation               AM-PAC PT "6 Clicks" Daily Activity  Outcome Measure Difficulty turning over in bed (including adjusting bedclothes, sheets and blankets)?: None Difficulty moving from lying on back to sitting on the side of the bed? : A Little Difficulty sitting down on and standing up from a chair with arms (e.g., wheelchair, bedside commode, etc,.)?: A Little Help needed moving to and from a bed to chair (including a wheelchair)?: A Little Help needed walking in hospital room?: A Little Help needed climbing 3-5 steps with a railing? : A Lot 6 Click Score: 18    End of Session Equipment Utilized During Treatment: Gait belt Activity Tolerance: Patient tolerated treatment well Patient left: in bed;with call bell/phone within reach;with bed alarm set;with family/visitor present Nurse Communication: Mobility status;Patient requests pain meds PT Visit Diagnosis: Muscle weakness (generalized) (M62.81);History of falling (Z91.81);Unsteadiness on feet (R26.81)    Time: 1358-1430 PT Time Calculation (min) (ACUTE ONLY): 32 min   Charges:         PT G Codes:  Wetzel Bjornstad, SPT 05/20/2017, 3:07 PM

## 2017-05-20 NOTE — Progress Notes (Signed)
She reports she has had some heartburn.  She is taking Protonix.  She reports no nausea.  She has been taking some full liquids.  She has been passing some small blood clots and has passed some gas but no stool.  She is passing her urine satisfactorily.  She reports she is walking in the hallway.  On examination there are some small fresh blood clots in her adult depends.  A finger was easily introduced into the rectum.  Sphincter tone is hypotonic.  Tissues are soft.  Can feel pliable staple lines.  Can reach into the upper aspect of the rectum beyond the surgical site.  She has discussed malignancy with Dr. Grayland Ormond and considering further outpatient evaluation next week at the cancer center.  Anticipating oncology consultation with Dr.Vanga for evaluation of gastrointestinal bleeding

## 2017-05-20 NOTE — Progress Notes (Signed)
She reports feeling better today.  She reports no dizziness and no nausea.  She has been taking some clear liquids.  She has passed a small amount of gas per rectum.  Also reports she is passed some small clots.  She has ambulated to a bedside commode.  Vital signs with blood pressure 116/60, pulse 104.  She is awake alert and oriented.  Abdomen is protuberant with tympany and minimal degree of tenderness.  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  Also have discussed this with Dr. Marius Ditch in gastroenterology for consultation.  It may helpful to do flexible sigmoidoscopy for further evaluation as I am concerned there may be something other than staple line bleeding possibly related to carcinomatosis and possible partial colonic obstruction

## 2017-05-20 NOTE — Plan of Care (Signed)
Problem: Nutrition: Goal: Adequate nutrition will be maintained Outcome: Progressing Pt diet advanced to full liquid; Pt tolerated the diet well.

## 2017-05-20 NOTE — Consult Note (Signed)
[place]  [phone] [phone]  ID: ADELY FACER OB: 1928-05-23  MR#: [id number]  QIH#:[id number]  Patient Care Team: [name], MD as PCP - General (Family Medicine) [name], RN as Registered Nurse  CHIEF COMPLAINT: Peritoneal carcinomatosis, rectal bleeding  INTERVAL HISTORY: Patient is a 83 year old female who recently underwent surgery for rectal prolapse.  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  CT scan reveal omental caking.  She was readmitted to the hospital yesterday with possible lower GI bleed.  She currently feels weak and fatigued. She has mild abdominal bloating, but denies any pain. She has a fair appetite. She has no neurologic complaints. She denies any fevers. She denies any chest pain or shortness of breath. She has no nausea, vomiting, constipation, or diarrhea. She does report some mild heartburn. She has no urinary complaints. Patient offers no further specific complaints.  REVIEW OF SYSTEMS:   Review of Systems  Constitutional: Positive for malaise/fatigue. Negative for fever and weight loss.  Respiratory: Negative.  Negative for cough and shortness of breath.   Cardiovascular: Negative.  Negative for chest pain and leg swelling.  Gastrointestinal: Positive for blood in stool and heartburn. Negative for abdominal pain, constipation, diarrhea, nausea and vomiting.  Genitourinary: Negative.  Negative for hematuria.  Musculoskeletal: Negative.   Skin: Negative.   Neurological: Positive for weakness.  Psychiatric/Behavioral: Negative.  The patient is not nervous/anxious.     As per HPI. Otherwise, a complete review of systems is negative.  PAST MEDICAL HISTORY: Past Medical History:  Diagnosis Date  . Anemia   . Arthritis   . Asthma   . Cancer ([place])   . Dyspnea   . GERD (gastroesophageal reflux disease)   . Hypertension   . Sjogren's  disease ([place])     PAST SURGICAL HISTORY: Past Surgical History:  Procedure Laterality Date  . ABDOMINAL HYSTERECTOMY    . BREAST BIOPSY     x6  . BUNIONECTOMY Bilateral   . CATARACT EXTRACTION, BILATERAL    . INCONTINENCE SURGERY    . REPAIR OF RECTAL PROLAPSE N/A 05/11/2017   Procedure: REPAIR OF RECTAL PROLAPSE;  Surgeon: Leonie Green, MD;  Location: [facility] ORS;  Service: General;  Laterality: N/A;  . TONSILLECTOMY      FAMILY HISTORY: Family History  Problem Relation Age of Onset  . Heart disease Mother   . Heart disease Father     ADVANCED DIRECTIVES (Y/N):  @ADVDIR @  HEALTH MAINTENANCE: Social History  Substance Use Topics  . Smoking status: Never Smoker  . Smokeless tobacco: Never Used  . Alcohol use No     Colonoscopy:  PAP:  Bone density:  Lipid panel:  No Known Allergies  Current Facility-Administered Medications  Medication Dose Route Frequency Provider Last Rate Last Dose  . acetaminophen (TYLENOL) tablet 500 mg  500 mg Oral BID PRN Vaughan Basta, MD   500 mg at 05/20/17 1434  . cyclobenzaprine (FLEXERIL) tablet 10 mg  10 mg Oral Corwin Levins, MD   10 mg at 05/19/17 2230  . docusate sodium (COLACE) capsule 100 mg  100 mg Oral BID PRN Vaughan Basta, MD      . fluticasone furoate-vilanterol (BREO ELLIPTA) 100-25 MCG/INH 1 puff  1 puff Inhalation QHS Vaughan Basta, MD   1 puff at 05/19/17 2231  . ipratropium-albuterol (DUONEB) 0.5-2.5 (3) MG/3ML nebulizer solution 3 mL  3 mL Nebulization Q6H PRN Vaughan Basta, MD      .  meclizine (ANTIVERT) tablet 25 mg  25 mg Oral TID PRN Vaughan Basta, MD      . montelukast (SINGULAIR) tablet 10 mg  10 mg Oral QHS Vaughan Basta, MD   10 mg at 05/19/17 2230  . pantoprazole (PROTONIX) EC tablet 40 mg  40 mg Oral Daily Fritzi Mandes, MD   40 mg at 05/20/17 1255  . polyethylene glycol (MIRALAX / GLYCOLAX) packet 17 g  17 g Oral BID Lin Landsman, MD   17 g at  05/20/17 1255  . polyvinyl alcohol (LIQUIFILM TEARS) 1.4 % ophthalmic solution 1-2 drop  1-2 drop Both Eyes TID PRN Vaughan Basta, MD        OBJECTIVE: Vitals:   05/20/17 1435 05/20/17 1447  BP: (!) 114/58   Pulse: (!) 108 (!) 114  Resp:    Temp: 98 F (36.7 C)   SpO2: 100% 94%     Body mass index is 22.68 kg/m.    ECOG FS:2 - Symptomatic, <50% confined to bed  General: Well-developed, well-nourished, no acute distress. Eyes: Pink conjunctiva, anicteric sclera. HEENT: Normocephalic, moist mucous membranes, clear oropharnyx. Lungs: Clear to auscultation bilaterally. Heart: Regular rate and rhythm. No rubs, murmurs, or gallops. Abdomen: Soft, nontender, nondistended. No organomegaly noted, normoactive bowel sounds. Musculoskeletal: No edema, cyanosis, or clubbing. Neuro: Alert, answering all questions appropriately. Cranial nerves grossly intact. Skin: No rashes or petechiae noted. Psych: Normal affect. Lymphatics: No cervical, calvicular, axillary or inguinal LAD.   LAB RESULTS:  Lab Results  Component Value Date   NA 133 (L) 05/20/2017   K 4.4 05/20/2017   CL 98 (L) 05/20/2017   CO2 23 05/20/2017   GLUCOSE 79 05/20/2017   BUN 43 (H) 05/20/2017   CREATININE 1.44 (H) 05/20/2017   CALCIUM 8.3 (L) 05/20/2017   PROT 6.3 (L) 05/19/2017   ALBUMIN 2.6 (L) 05/19/2017   AST 29 05/19/2017   ALT 13 (L) 05/19/2017   ALKPHOS 77 05/19/2017   BILITOT 0.5 05/19/2017   GFRNONAA 32 (L) 05/20/2017   GFRAA 37 (L) 05/20/2017    Lab Results  Component Value Date   WBC 14.6 (H) 05/20/2017   HGB 9.0 (L) 05/20/2017   HCT 25.4 (L) 05/20/2017   MCV 84.6 05/20/2017   PLT 436 05/20/2017     STUDIES: Ct Abdomen Pelvis W Contrast  Result Date: 05/14/2017 CLINICAL DATA:  Abdominal mass. EXAM: CT ABDOMEN AND PELVIS WITH CONTRAST TECHNIQUE: Multidetector CT imaging of the abdomen and pelvis was performed using the standard protocol following bolus administration of intravenous  contrast. CONTRAST:  33mL ISOVUE-300 IOPAMIDOL (ISOVUE-300) INJECTION 61% COMPARISON:  None. FINDINGS: Lower chest: Small bilateral pleural effusions. Moderate hiatal hernia with about 50% of the stomach in the chest. 2.1 x 3.7 cm abnormal soft tissue mass is identified in the hiatal hernia (image 7 series 2). Hepatobiliary: Multiple soft tissue masses are seen along the liver capsule and in the right infra diaphragmatic space. Index lesion along the posteromedial right lobe measures 5.8 x 2.0 cm on image 24 series 2. There is no evidence for gallstones, gallbladder wall thickening, or pericholecystic fluid. No intrahepatic or extrahepatic biliary dilation. Pancreas: No focal mass lesion. No dilatation of the main duct. No intraparenchymal cyst. No peripancreatic edema. Spleen: Calcified granuloma noted in the spleen. Adrenals/Urinary Tract: No adrenal nodule or mass. Kidneys unremarkable. No evidence for hydroureter. Bladder is moderately distended. Stomach/Bowel: Hiatal hernia as described above. Large duodenal diverticulum evident. No evidence for small bowel obstruction. Terminal ileum not well seen,  but the ileocecal valve and terminal ileum visible on image 21 of series 8. Appendix is visualized on image 25 of coronal series 8 and is unremarkable. The colon is diffusely distended with stool although left colon is decompressed. Suture line noted in the region of the anal rectal junction. Vascular/Lymphatic: There is abdominal aortic atherosclerosis without aneurysm. See "Other" section in report below. Reproductive: Uterus appears to be surgically absent. 19 mm cystic lesion identified in the left adnexal space. No right adnexal mass. Other: Small to moderate volume ascites identified. Peritoneal, omental, and mesenteric nodularity is compatible with metastatic disease. 4.1 x 8.2 cm left omental cake is identified on image 40 of series 2. 7.4 x 2.8 cm omental cake is identified in the splenic flexure (image 19  series 2). Numerous other areas of omental and peritoneal nodularity are evident. Loculated fluid with peritoneal nodularity is identified in the cul-de-sac. 2.4 x 3.3 cm soft tissue nodule appears to invade the posterior right bladder wall (image 61 series 2). 4.7 x 1.7 cm nodal conglomeration or abnormal soft tissue mass identified in the porta hepatis. This abnormal soft tissue tracks cranially up along the IVC and liver capsule, generating marked mass-effect on the intrahepatic segment of the IVC. Musculoskeletal: Convex rightward thoracolumbar scoliosis is associated with compensatory rightward scoliosis of the lower lumbar spine. IMPRESSION: 1. Small to moderate volume ascites with bulky omental, peritoneal, and mesenteric soft tissue compatible with metastatic disease. There are multiple peritoneal nodules with fluid in the cul-de-sac. One of these nodules appears to involve the posterior right bladder wall. Primary source of neoplasm not evident on this exam. 2. Bulky metastatic disease in the porta hepatis tracking up along the medial liver capsule generates substantial mass-effect on the intrahepatic IVC. 3. Right and transverse segments of the colon are distended up to 6 cm diameter in stool-filled. Left colon is decompressed. These changes may be related to constipation, but colonic obstruction not entirely excluded. There is no associated small bowel dilatation. 4. Diffuse body wall edema. 5.  Aortic Atherosclerois (ICD10-170.0) Electronically Signed   By: Misty Stanley M.D.   On: 05/14/2017 16:04   Dg Abd 2 Views  Result Date: 05/19/2017 CLINICAL DATA:  History of surgery for rectal prolapse, near syncopal episode EXAM: ABDOMEN - 2 VIEW COMPARISON:  None. FINDINGS: Supine and upright views of the abdomen. Moderate hiatal hernia. No free air beneath the diaphragm. Nonobstructed gas pattern with large amount of stool in the colon. Surgical sutures in the region of the rectum. Calcified pelvic  phleboliths. Scoliosis of the spine. IMPRESSION: 1. Nonobstructed bowel-gas pattern with large amount of stool in the colon. Negative for free air. 2. Moderate hiatal hernia. Electronically Signed   By: Donavan Foil M.D.   On: 05/19/2017 15:07    ASSESSMENT: Peritoneal carcinomatosis, rectal bleeding  PLAN:    1. Peritoneal carcinomatosis: CT scan and pathology results reviewed independently confirming diagnosis. Will get a CA-125 for completeness. Patient is unclear how aggressive she would like to be with treating her malignancy. Will arrange follow-up with gynecology oncology as well as outpatient follow-up with medical oncology next Wednesday to further discuss her treatment options and potential treatment planning. No further intervention is needed at this time. 2. Rectal bleeding: Appreciate GI and surgical input. Unclear etiology at this time, the concern is for bleeding at the anastomosis site, diverticular bleed, ischemic colitis, or possibly malignant invasion. Flexible sigmoidoscopy is planned to further evaluate. 3. Anemia: Secondary to bleed. Patient's most recent hemoglobin is  9.0. She does not require blood transfusion at this time.  Appreciate consult, will follow.  Lloyd Huger, MD   05/20/2017 10:42 PM

## 2017-05-20 NOTE — Consult Note (Signed)
Cephas Darby, MD [address]  [place]  [place], [place] [address]  Main: [phone]  Fax: [phone] Pager: [phone]   Consultation  Referring Provider:     No ref. provider found Primary Care Physician:  [name], MD Primary Gastroenterologist:  None        Reason for Consultation:     Rectal bleeding  Date of Admission:  05/19/2017 Date of Consultation:  05/20/2017         HPI:   Tamara Mckinney is a 83 y.o. female , very pleasant, history of hypertension, GERD, asthma, Sjogren's who recently had excision of the rectal prolapse as an outpatient elective surgery on 05/11/2017. Postoperatively she reported rectal bleeding and found to be anemic with hemoglobin of 7.2 on 05/12/2017. She received 2 units of blood and was discharged home on 05/15/2017. At the time of discharge her hemoglobin was 9.1. She presented to the ER yesterday due to ongoing rectal bleeding since surgery. She had near syncope at home. She reports seeing blood clots per rectum. Her hemoglobin on arrival was 6.3 yesterday. She received 2 units of blood and responded appropriately. She continues to have ongoing rectal bleeding as dark red with blood clots. Denies having rectal bleeding in the past and prior to surgery.  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 The specimen also revealed mild active colitis consistent with ischemia. Subsequently, CT A/P revealed intraperitoneal carcinomatosis and markedly distended right and transverse colon filled with fluid and left colon is decompressed.  Patient reports that she has been constipated almost all her life, generally had one bowel movement every other day, formed to hard in consistency. Has been taking MiraLAX. Her last BM was 2-3 days ago prior to her admission. She reported seeing 3 solid balls of stool along with blood clots. She reports passing  gas and denies abdominal pain, nausea, vomiting. She is tolerating full liquid diet in the hospital.  GI Procedures: May had a colonoscopy several years ago  Past Medical History:  Diagnosis Date  . Anemia   . Arthritis   . Asthma   . Cancer ([place])   . Dyspnea   . GERD (gastroesophageal reflux disease)   . Hypertension   . Sjogren's disease [facility])     Past Surgical History:  Procedure Laterality Date  . ABDOMINAL HYSTERECTOMY    . BREAST BIOPSY     x6  . BUNIONECTOMY Bilateral   . CATARACT EXTRACTION, BILATERAL    . INCONTINENCE SURGERY    . REPAIR OF RECTAL PROLAPSE N/A 05/11/2017   Procedure: REPAIR OF RECTAL PROLAPSE;  Surgeon: Leonie Green, MD;  Location: [facility] ORS;  Service: General;  Laterality: N/A;  . TONSILLECTOMY      Prior to Admission medications   Medication Sig Start Date End Date Taking? Authorizing Provider  acetaminophen (TYLENOL) 500 MG tablet Take 500 mg by mouth 2 (two) times daily as needed for moderate pain.    Yes [provider]  albuterol (PROVENTIL HFA;VENTOLIN HFA) 108 (90 Base) MCG/ACT inhaler Inhale 1-2 puffs into the lungs every 6 (six) hours as needed for wheezing or shortness of breath.   Yes [provider]  amLODipine (NORVASC) 5 MG tablet Take 5 mg by mouth daily with breakfast.   Yes [provider]  Ascorbic Acid (VITAMIN C) 1000 MG tablet Take 1,000 mg by mouth daily.   Yes [provider]  Calcium  Carb-Cholecalciferol (CALTRATE 600+D) 600-800 MG-UNIT TABS Take 1 tablet by mouth daily.   Yes [provider]  Carboxymeth-Glyc-Polysorb PF (REFRESH OPTIVE ADVANCED PF) 0.5-1-0.5 % SOLN Place 1-2 drops into both eyes 3 (three) times daily as needed. For dry, irritated eyes   Yes [provider]  Carboxymethylcellulose Sod PF (REFRESH CELLUVISC) 1 % GEL Place 1 drop into both eyes at bedtime. In the middle of the night    Yes [provider]  cetirizine (ZYRTEC) 10 MG tablet Take  10 mg by mouth at bedtime as needed for allergies.   Yes [provider]  Cholecalciferol (VITAMIN D3) 2000 units capsule Take 2,000 Units by mouth daily.   Yes [provider]  Cyanocobalamin (RA VITAMIN B12) 2000 MCG TBCR Take 2,000 mcg by mouth daily.   Yes [provider]  cyclobenzaprine (FLEXERIL) 10 MG tablet Take 10 mg by mouth at bedtime.   Yes [provider]  DHA-EPA-Flaxseed Oil-Vitamin E (THERA TEARS NUTRITION PO) Take 2 tablets by mouth daily.   Yes [provider]  docusate sodium (COLACE) 100 MG capsule Take 100-300 mg by mouth at bedtime as needed for mild constipation.   Yes [provider]  esomeprazole (NEXIUM) 40 MG capsule Take 40 mg by mouth daily.    Yes [provider]  fluticasone furoate-vilanterol (BREO ELLIPTA) 100-25 MCG/INH AEPB Inhale 1 puff into the lungs at bedtime.   Yes [provider]  furosemide (LASIX) 40 MG tablet Take 20 mg by mouth daily with breakfast.    Yes [provider]  Glucosamine-MSM-Hyaluronic Acd ([place]) Take 1 tablet by mouth daily.   Yes [provider]  hypromellose (SYSTANE OVERNIGHT THERAPY) 0.3 % GEL ophthalmic ointment Place 1 application into both eyes at bedtime.   Yes [provider]  ipratropium-albuterol (DUONEB) 0.5-2.5 (3) MG/3ML SOLN Take 3 mLs by nebulization every 6 (six) hours as needed. For wheezing/shortness of breath   Yes [provider]  losartan (COZAAR) 100 MG tablet Take 100 mg by mouth at bedtime.   Yes [provider]  meclizine (ANTIVERT) 25 MG tablet Take 25 mg by mouth 3 (three) times daily as needed for dizziness.    Yes [provider]  meloxicam (MOBIC) 15 MG tablet Take 15 mg by mouth daily with breakfast.   Yes [provider]  montelukast (SINGULAIR) 10 MG tablet Take 10 mg by mouth at bedtime.   Yes [provider]  triamcinolone cream (KENALOG) 0.5 % Apply 1  application topically as needed. For inflammation   Yes [provider]    Family History  Problem Relation Age of Onset  . Heart disease Mother   . Heart disease Father      Social History  Substance Use Topics  . Smoking status: Never Smoker  . Smokeless tobacco: Never Used  . Alcohol use No    Allergies as of 05/19/2017  . (No Known Allergies)    Review of Systems:    All systems reviewed and negative except where noted in HPI.   Physical Exam:  Vital signs in last 24 hours: Temp:  [97.6 F (36.4 C)-98.5 F (36.9 C)] 98 F (36.7 C) (10/04 1435) Pulse Rate:  [90-114] 114 (10/04 1447) Resp:  [18-22] 18 (10/04 0822) BP: (114-127)/(54-60) 114/58 (10/04 1435) SpO2:  [94 %-100 %] 94 % (10/04 1447) Last BM Date: 05/19/17 General:   Pleasant, cooperative in NAD Head:  Normocephalic and atraumatic. Eyes:   No icterus.  Conjunctiva pink. PERRLA. Ears:  Normal auditory acuity. Neck:  Supple; no masses or thyroidomegaly Lungs: Respirations even and unlabored. Lungs clear to auscultation bilaterally.   No wheezes, crackles, or rhonchi.  Heart:  Regular rate and rhythm;  Without murmur, clicks, rubs or gallops Abdomen:  Soft, diffusely and moderately distended, tympanic, nontender. Hypoactive bowel sounds. No appreciable masses or hepatomegaly.  No rebound or guarding.  Rectal:  blood clots on her diaper, maroon bloody fluid draining per rectum Msk:  Symmetrical without gross deformities.  Strength normal for her age Extremities:  Without edema, cyanosis or clubbing. Neurologic:  Alert and oriented x3;  grossly normal neurologically. Skin:  Intact without significant lesions or rashes. Cervical Nodes:  No significant cervical adenopathy. Psych:  Alert and cooperative. Normal affect.  LAB RESULTS: CBC Latest Ref Rng & Units 05/20/2017 05/19/2017 05/19/2017  WBC 3.6 - 11.0 K/uL 14.6(H) - 12.6(H)  Hemoglobin 12.0 - 16.0 g/dL 9.0(L) 7.3(L) 6.3(L)  Hematocrit 35.0 - 47.0  % 25.4(L) 21.2(L) 19.1(L)  Platelets 150 - 440 K/uL 436 - 497(H)    BMET BMP Latest Ref Rng & Units 05/20/2017 05/19/2017 05/15/2017  Glucose 65 - 99 mg/dL 79 116(H) 88  BUN 6 - 20 mg/dL 43(H) 51(H) 32(H)  Creatinine 0.44 - 1.00 mg/dL 1.44(H) 1.94(H) 1.22(H)  Sodium 135 - 145 mmol/L 133(L) 129(L) 131(L)  Potassium 3.5 - 5.1 mmol/L 4.4 5.1 4.7  Chloride 101 - 111 mmol/L 98(L) 95(L) 98(L)  CO2 22 - 32 mmol/L '23 23 26  '$ Calcium 8.9 - 10.3 mg/dL 8.3(L) 8.2(L) 8.3(L)    LFT Hepatic Function Latest Ref Rng & Units 05/19/2017  Total Protein 6.5 - 8.1 g/dL 6.3(L)  Albumin 3.5 - 5.0 g/dL 2.6(L)  AST 15 - 41 U/L 29  ALT 14 - 54 U/L 13(L)  Alk Phosphatase 38 - 126 U/L 77  Total Bilirubin 0.3 - 1.2 mg/dL 0.5     STUDIES: Dg Abd 2 Views  Result Date: 05/19/2017 CLINICAL DATA:  History of surgery for rectal prolapse, near syncopal episode EXAM: ABDOMEN - 2 VIEW COMPARISON:  None. FINDINGS: Supine and upright views of the abdomen. Moderate hiatal hernia. No free air beneath the diaphragm. Nonobstructed gas pattern with large amount of stool in the colon. Surgical sutures in the region of the rectum. Calcified pelvic phleboliths. Scoliosis of the spine. IMPRESSION: 1. Nonobstructed bowel-gas pattern with large amount of stool in the colon. Negative for free air. 2. Moderate hiatal hernia. Electronically Signed   By: Donavan Foil M.D.   On: 05/19/2017 15:07      Impression / Plan:   LORIENE TAUNTON is a 83 y.o. 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 Ongoing rectal bleeding since excision of rectal prolapse. Differentials include bleeding at the anastomosis site or diverticular bleed or ischemic colitis.   - Ogilvie's based on CT findings - Recommend flexible sigmoidoscopy without sedation and  decompression - Continue bowel regimen - NPO past midnight - Monitor electrolytes closely, goal K > 4, Mag > 2  Thank you for involving me in the care of this patient.  I will follow along with you    LOS: 1 day   Sherri Sear, MD  05/20/2017, 7:31 PM   Note: This dictation was prepared with Dragon dictation along with smaller phrase technology. Any transcriptional errors that result from this process are  unintentional.

## 2017-05-21 ENCOUNTER — Encounter
Admission: EM | Disposition: A | Discharge status: Home Health | Payer: Self-pay | Source: Home / Self Care | Attending: Internal Medicine

## 2017-05-21 ENCOUNTER — Inpatient Hospital Stay: Payer: Medicare Other | Admitting: Oncology

## 2017-05-21 HISTORY — PX: FLEXIBLE SIGMOIDOSCOPY: SHX5431

## 2017-05-21 LAB — IRON AND TIBC
Iron: 10 ug/dL — ABNORMAL LOW (ref 28–170)
Saturation Ratios: 5 % — ABNORMAL LOW (ref 10.4–31.8)
TIBC: 223 ug/dL — ABNORMAL LOW (ref 250–450)
UIBC: 213 ug/dL

## 2017-05-21 LAB — FERRITIN: Ferritin: 153 ng/mL (ref 11–307)

## 2017-05-21 LAB — APTT: aPTT: 60 seconds — ABNORMAL HIGH (ref 24–36)

## 2017-05-21 LAB — CBC
HCT: 22.2 % — ABNORMAL LOW (ref 35.0–47.0)
Hemoglobin: 7.6 g/dL — ABNORMAL LOW (ref 12.0–16.0)
MCH: 29.8 pg (ref 26.0–34.0)
MCHC: 34.2 g/dL (ref 32.0–36.0)
MCV: 87 fL (ref 80.0–100.0)
Platelets: 437 10*3/uL (ref 150–440)
RBC: 2.55 MIL/uL — ABNORMAL LOW (ref 3.80–5.20)
RDW: 14.6 % — ABNORMAL HIGH (ref 11.5–14.5)
WBC: 12.1 10*3/uL — ABNORMAL HIGH (ref 3.6–11.0)

## 2017-05-21 LAB — HEMOGLOBIN AND HEMATOCRIT, BLOOD
HCT: 27.1 % — ABNORMAL LOW (ref 35.0–47.0)
Hemoglobin: 9.5 g/dL — ABNORMAL LOW (ref 12.0–16.0)

## 2017-05-21 LAB — VITAMIN B12: Vitamin B-12: 2390 pg/mL — ABNORMAL HIGH (ref 180–914)

## 2017-05-21 LAB — PREPARE RBC (CROSSMATCH)

## 2017-05-21 LAB — HEMOGLOBIN: Hemoglobin: 8.1 g/dL — ABNORMAL LOW (ref 12.0–16.0)

## 2017-05-21 SURGERY — SIGMOIDOSCOPY, FLEXIBLE

## 2017-05-21 MED ORDER — ACETAMINOPHEN 500 MG PO TABS
500.0000 mg | ORAL_TABLET | Freq: Once | ORAL | Status: AC
  Administered 2017-05-21: 500 mg via ORAL

## 2017-05-21 MED ORDER — SODIUM CHLORIDE 0.9 % IV SOLN
Freq: Once | INTRAVENOUS | Status: AC
  Administered 2017-05-21: 15:00:00 via INTRAVENOUS

## 2017-05-21 MED ORDER — LIDOCAINE HCL 2 % EX GEL
CUTANEOUS | Status: AC
  Administered 2017-05-21: 1 via TOPICAL
  Filled 2017-05-21: qty 5

## 2017-05-21 MED ORDER — LIDOCAINE HCL 2 % EX GEL
1.0000 "application " | Freq: Once | CUTANEOUS | Status: AC
  Administered 2017-05-21: 1 via TOPICAL

## 2017-05-21 MED ORDER — EPINEPHRINE PF 1 MG/10ML IJ SOSY
PREFILLED_SYRINGE | INTRAMUSCULAR | Status: DC | PRN
  Administered 2017-05-21: 0.2 mg via SUBCUTANEOUS

## 2017-05-21 MED ORDER — ACETAMINOPHEN 500 MG PO TABS
ORAL_TABLET | ORAL | Status: AC
  Administered 2017-05-21: 500 mg via ORAL
  Filled 2017-05-21: qty 1

## 2017-05-21 NOTE — Progress Notes (Signed)
I reviewed the flexible sigmoidoscopy findings and pictures with Dr.Vanga. A blood clot was found adjacent to the staple line. There was no active bleeding. Some feces was noted in the  sigmoid colon.  Plan is to continue with full liquid diet and advance as tolerated. Continue MiraLAX. Anticipate 1 blood transfusion today.  Anticipate she can go home once we see that she is moving her bowels satisfactorily without bleeding and hemoglobin is stable. Dr. Grayland Ormond will evaluate the coagulopathy.

## 2017-05-21 NOTE — Consult Note (Signed)
Isolated elevated PTT noted. Very few things can result in a normal PT and INR with an elevated PTT, but this includes von Willebrand's disease as well a lupus anticoagulant. Both have been ordered for completeness. Please ensure all heparin products, including flushes, have been discontinued for completeness.  Will follow.

## 2017-05-21 NOTE — Op Note (Signed)
[facility] Gastroenterology Patient Name: Tamara Mckinney Procedure Date: 05/21/2017 11:13 AM MRN: [id number] Account #: [id number] Date of Birth: [birth year] Admit Type: Inpatient Age: 83 Room: [facility] ENDO ROOM 4 Gender: Female Note Status: Finalized Procedure:            Flexible Sigmoidoscopy Indications:          Rectal hemorrhage, Hematochezia Providers:            Lin Landsman MD, MD Referring MD:         Youlanda Roys. Lovie Macadamia, MD (Referring MD) Medicines:            None Complications:        No immediate complications. Estimated blood loss: None. Procedure:            Pre-Anesthesia Assessment:                       - Prior to the procedure, a History and Physical was                        performed, and patient medications and allergies were                        reviewed. The patient is competent. The risks and                        benefits of the procedure and the sedation options and                        risks were discussed with the patient. All questions                        were answered and informed consent was obtained.                        Patient identification and proposed procedure were                        verified by the physician, the nurse and the technician                        in the pre-procedure area in the procedure room. Mental                        Status Examination: alert and oriented. Airway                        Examination: normal oropharyngeal airway and neck                        mobility. Respiratory Examination: clear to                        auscultation. CV Examination: normal. Prophylactic                        Antibiotics: The patient does not require prophylactic                        antibiotics. Prior Anticoagulants: The patient has  taken no previous anticoagulant or antiplatelet agents.                        ASA Grade Assessment: IV - A patient with severe                         systemic disease that is a constant threat to life.                        After reviewing the risks and benefits, the patient was                        deemed in satisfactory condition to undergo the                        procedure. The anesthesia plan was to use no sedation                        or anesthesia. Immediately prior to administration of                        medications, the patient was re-assessed for adequacy                        to receive sedatives. The heart rate, respiratory rate,                        oxygen saturations, blood pressure, adequacy of                        pulmonary ventilation, and response to care were                        monitored throughout the procedure. The physical status                        of the patient was re-assessed after the procedure.                       After obtaining informed consent, the scope was passed                        under direct vision. The Endoscope was introduced                        through the anus and advanced to the 20 cm from the                        anal verge. The flexible sigmoidoscopy was accomplished                        without difficulty. The patient tolerated the procedure                        well. The quality of the bowel preparation was adequate. Findings:      The perianal examination was normal.      The digital rectal exam findings include suture material string was felt       and blood stain on  glove. Pertinent negatives include no palpable rectal       lesions.      Old blood seen in rectum. There was evidence of a prior functional       end-to-end colo-rectal anastomosis in the distal rectum, approximately       5cm from anal verge. This was patent. The anastomosis revealed 3 areas       of recent bleeding, 1 area with large adherent blood clot overlying the       staple line. Was unable to dislodge the clot despite lavage. This area       was injected with 1cc epinephrine  to temporize future bleeding. Other       two areas showed stigmata of recent bleeding. There was no active       bleeding. The anastomosis was traversed. Solid brown stool with old       blood proximal to the anastomosis is seen. After washing old blood,       underlying mucosa appeared normal. There is no evidence of bleed       proximal to the anastomosis. Impression:           - Colo-rectal anastomosis with stigmata of recent                        bleeding, likely arterial and source of hematochezia,                        no active bleeding.                       - Patent functional end-to-end colo-rectal anastomosis.                       - No specimens collected. Recommendation:       - Return patient to hospital ward for ongoing care.                       - Advance diet as tolerated today.                       - Manage constipation                       - Call on call GI if she is passing fresh blood per                        rectum, expect to pass old blood clots seen in rectum                       - Monitor CBC closely Procedure Code(s):    --- Professional ---                       [phone], Sigmoidoscopy, flexible; diagnostic, including                        collection of specimen(s) by brushing or washing, when                        performed (separate procedure) Diagnosis Code(s):    --- Professional ---  Z98.0, Intestinal bypass and anastomosis status                       K62.5, Hemorrhage of anus and rectum CPT copyright 2016 American Medical Association. All rights reserved. The codes documented in this report are preliminary and upon coder review may  be revised to meet current compliance requirements. Dr. Ulyess Mort Lin Landsman MD, MD 05/21/2017 12:22:54 PM This report has been signed electronically. Number of Addenda: 0 Note Initiated On: 05/21/2017 11:13 AM      [facility]

## 2017-05-21 NOTE — Progress Notes (Signed)
Flex sig post procedure note  Findings:  - The perianal examination was normal.  - The digital rectal exam findings include suture material string was felt and blood stain on glove.  Pertinent negatives include no palpable rectal lesions.  - Old blood seen in rectum. There was evidence of a prior functional end-to-end colo-rectal anastomosis in the distal rectum, approximately 5cm from anal verge. This was patent.  The anastomosis revealed 3 areas of recent bleeding, 1 area with large adherent blood clot overlying the staple line. Was unable to dislodge the clot despite lavage. This area was injected with 1cc epinephrine to temporize future bleeding. Other two areas showed stigmata of recent bleeding. There was no active bleeding. The anastomosis was traversed. Solid brown stool with old blood proximal to the anastomosis is seen. After washing old blood, underlying mucosa appeared normal. There is no evidence of bleed proximal to the anastomosis.   Recs: - Advance diet as tolerated today. - Manage constipation - Call on-call GI if she is passing fresh blood per rectum, expect to pass old blood clots seen in rectum - Monitor CBC closely  - We will follow  Cephas Darby, MD [address]  [place]  [place], [place] [address]  Main: [phone]  Fax: [phone] Pager: [phone]

## 2017-05-21 NOTE — Progress Notes (Signed)
She reports minimal discomfort today.  She has not yet had any breakfast as she is awaiting flexible sigmoidoscopy.  She reports she has not passed any blood today.  Has not yet been out of bed today.  I have reviewed Dr. Gary Fleet note regarding carcinomatosis and plans for gynecologic oncology evaluation.  I have reviewed Dr.Vanda's gastroenterology note.  On exam today she is awake alert and oriented.  Her abdomen is mildly protuberant and tympanitic and soft and nontender with palpable mass in the left mid abdomen.  Repeat serum prothrombin time today is 60.  Hemoglobin 7.6  Impression rectal bleeding with coagulopathy, carcinomatosis  I discussed this with Dr. Grayland Ormond who will evaluate coagulopathy.  I discussed this with Dr. Posey Pronto who will order another blood transfusion..  I discussed with the patient how a coagulopathy can markedly increase her risk of bleeding and make it difficult for bleeding to resolve.  Anticipate flexible sigmoidoscopy.  I encouraged walking in the hallway.

## 2017-05-21 NOTE — Progress Notes (Signed)
[facility] NAME: Tamara Mckinney    MR#:  [id number]  DATE OF BIRTH:  [birth year]  SUBJECTIVE:  Some rectal bleeding with small clotts Tolerating CLD Sons in the room  REVIEW OF SYSTEMS:   Review of Systems  Constitutional: Negative for chills, fever and weight loss.  HENT: Negative for ear discharge, ear pain and nosebleeds.   Eyes: Negative for blurred vision, pain and discharge.  Respiratory: Negative for sputum production, shortness of breath, wheezing and stridor.   Cardiovascular: Negative for chest pain, palpitations, orthopnea and PND.  Gastrointestinal: Negative for abdominal pain, diarrhea, nausea and vomiting.  Genitourinary: Negative for frequency and urgency.  Musculoskeletal: Negative for back pain and joint pain.  Neurological: Negative for sensory change, speech change, focal weakness and weakness.  Psychiatric/Behavioral: Negative for depression and hallucinations. The patient is not nervous/anxious.    Tolerating Diet:yes Tolerating PT: pending  DRUG ALLERGIES:  No Known Allergies  VITALS:  Blood pressure 124/67, pulse (!) 108, temperature 98.1 F (36.7 C), temperature source Oral, resp. rate 18, height 4\' 9"  (1.448 m), weight 47.5 kg (104 lb 12.8 oz), SpO2 97 %.  PHYSICAL EXAMINATION:   Physical Exam  GENERAL:  83 y.o.-year-old patient lying in the bed with no acute distress.  EYES: Pupils equal, round, reactive to light and accommodation. No scleral icterus. Extraocular muscles intact.  HEENT: Head atraumatic, normocephalic. Oropharynx and nasopharynx clear.  NECK:  Supple, no jugular venous distention. No thyroid enlargement, no tenderness.  LUNGS: Normal breath sounds bilaterally, no wheezing, rales, rhonchi. No use of accessory muscles of respiration.  CARDIOVASCULAR: S1, S2 normal. No murmurs, rubs, or gallops.  ABDOMEN: Soft, nontender, nondistended. Bowel sounds present. No organomegaly or  mass.  EXTREMITIES: No cyanosis, clubbing or edema b/l.    NEUROLOGIC: Cranial nerves II through XII are intact. No focal Motor or sensory deficits b/l.   PSYCHIATRIC:  patient is alert and oriented x 3.  SKIN: No obvious rash, lesion, or ulcer.   LABORATORY PANEL:  CBC  Recent Labs Lab 05/21/17 0534 05/21/17 1257  WBC 12.1*  --   HGB 7.6* 8.1*  HCT 22.2*  --   PLT 437  --     Chemistries   Recent Labs Lab 05/19/17 1124 05/20/17 0600  NA 129* 133*  K 5.1 4.4  CL 95* 98*  CO2 23 23  GLUCOSE 116* 79  BUN 51* 43*  CREATININE 1.94* 1.44*  CALCIUM 8.2* 8.3*  MG  --  2.2  AST 29  --   ALT 13*  --   ALKPHOS 77  --   BILITOT 0.5  --    Cardiac Enzymes No results for input(s): TROPONINI in the last 168 hours. RADIOLOGY:  No results found. ASSESSMENT AND PLAN:  Tamara Mckinney  is a 83 y.o. female with a known history of a recent problem with symptomatic rectal prolapse. She also has a history of chronic anemia. She had surgery last week with excision of prolapsed rectum. Postoperatively she did have some rectal bleeding and acute anemia and did have 2 blood transfusions. She also had pathology findings of malignant cells on the external surface of the rectum. 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  * Symptomatic anemia due to rectal bleeding  -came in with Hgb 6.3---7.3--2 units BT---9.0--7.6--1 unit BT today---8.3 - Recent sx, rectal exam have mucous + blood as per Dr. Rochel Brome. 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 -Dr Grayland Ormond to see pt    * Hyperkalemia--resolved -Due to renal failure -K 4.4  *DVT prophylaxis -SCD and TEDS Avoid antiplatelet agents due to rectal  bleeding  Case discussed with Care Management/Social Worker. Management plans discussed with the patient, family and they are in agreement.  CODE STATUS:FULL  DVT Prophylaxis: SCD?TEDS  TOTAL TIME TAKING CARE OF THIS PATIENT: 30 minutes.  >50% time spent on counselling and coordination of care  POSSIBLE D/C IN 1-2 DAYS, DEPENDING ON CLINICAL CONDITION.  Note: This dictation was prepared with Dragon dictation along with smaller phrase technology. Any transcriptional errors that result from this process are unintentional.  Tamara Mckinney M.D on 05/21/2017 at 3:24 PM  Between 7am to 6pm - Pager - [phone]  After 6pm go to [email or web address] - password [email or web address] [facility]  Office  [phone]  CC: Primary care physician; [name], MDPatient ID: Tamara Mckinney, female   DOB: [birth year], 83 y.o.   MRN: [id number]

## 2017-05-22 LAB — BPAM RBC
Blood Product Expiration Date: 201810162359
Blood Product Expiration Date: 201810162359
Blood Product Expiration Date: 201810172359
ISSUE DATE / TIME: 201810031247
ISSUE DATE / TIME: 201810031801
ISSUE DATE / TIME: 201810051504
Unit Type and Rh: 5100
Unit Type and Rh: 6200
Unit Type and Rh: 6200

## 2017-05-22 LAB — TYPE AND SCREEN
ABO/RH(D): A POS
Antibody Screen: NEGATIVE
Unit division: 0
Unit division: 0
Unit division: 0

## 2017-05-22 NOTE — Discharge Instructions (Signed)
Resume diet and activity as before  Return to ED if any worsening bleeding

## 2017-05-22 NOTE — Progress Notes (Signed)
Pt has been discharged home per MD order; discharge instructions discuss with Pt and Pt verbalized understanding. Pt vitals sign stable; Pt transported with Nurse on wheelchair at the visitor entry.

## 2017-05-22 NOTE — Care Management Note (Signed)
Case Management Note  Patient Details  Name: Tamara Mckinney MRN: [id number] Date of Birth: 1928-07-24  Subjective/Objective:     Discussed discharge planning with Tamara Mckinney and her son. They were undecided about the choice of a home health provider but decided upon [facility] because [facility] in in network with [place].  A referral for HH=PT was called to [facility] at [facility].               Action/Plan:   Expected Discharge Date:  05/22/17               Expected Discharge Plan:  [place]  In-House Referral:  NA  Discharge planning Services  NA  Post Acute Care Choice:  NA Choice offered to:  Patient, Adult Children  DME Arranged:  N/A DME Agency:  NA  HH Arranged:  PT HH Agency:  [facility]  Status of Service:  Completed, signed off  If discussed at [place] of Stay Meetings, dates discussed:    Additional Comments:  Shalissa Easterwood A, RN 05/22/2017, 11:01 AM

## 2017-05-22 NOTE — Progress Notes (Signed)
Patient ID: Tamara Mckinney, female   DOB: 05-06-1928, 83 y.o.   MRN: [id number] Pt states she has had no further bleeding. She is feeling stronger. Would love to go home today. Hgb up to 9.5 VSS Ok to discharge today or tomorrow.

## 2017-05-22 NOTE — Progress Notes (Signed)
Physical Therapy Treatment Patient Details Name: Tamara Mckinney MRN: [id number] DOB: [birth year] Today's Date: 05/22/2017    History of Present Illness Pt is a 83 y.o. female with PMH of anemia, asthma, GERD, cancer and HTN. Pt recently admitted 05/11/17 for excision of prolapsed rectum. 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    PT Comments    Pt ready to walk this am.  She was able to navigate a full lap around nursing unit today without seated rest.  Does well with rollator.  She does walk quickly at times with vc's to slow down.  Pt reports some light headedness but denies dizziness this session.  Discussed rest breaks at home and not overdoing it upon discharge and general safety awareness.  Son in for session.    Follow Up Recommendations  Home health PT     Equipment Recommendations  None recommended by PT    Recommendations for Other Services       Precautions / Restrictions Precautions Precautions: Fall Restrictions Weight Bearing Restrictions: No    Mobility  Bed Mobility               General bed mobility comments: in chair  Transfers Overall transfer level: Needs assistance   Transfers: Sit to/from Stand Sit to Stand: Supervision            Ambulation/Gait Ambulation/Gait assistance: Min guard Ambulation Distance (Feet): 200 Feet Assistive device: 4-wheeled walker Gait Pattern/deviations: Step-through pattern   Gait velocity interpretation: at or above normal speed for age/gender General Gait Details: steady gait, quick at times with vc's to slow   Stairs            Wheelchair Mobility    Modified Rankin (Stroke Patients Only)       Balance Overall balance assessment: Needs assistance Sitting-balance support: Feet supported Sitting balance-Leahy Scale: Good     Standing balance support: Bilateral  upper extremity supported Standing balance-Leahy Scale: Fair Standing balance comment: requires rollator for BUE support while walking                            Cognition Arousal/Alertness: Awake/alert Behavior During Therapy: WFL for tasks assessed/performed Overall Cognitive Status: Within Functional Limits for tasks assessed                                        Exercises      General Comments        Pertinent Vitals/Pain Pain Assessment: 0-10 Pain Score: 4  Pain Location: Abdomen Pain Descriptors / Indicators: Discomfort;Heaviness Pain Intervention(s): Limited activity within patient's tolerance    Home Living                      Prior Function            PT Goals (current goals can now be found in the care plan section) Progress towards PT goals: Progressing toward goals    Frequency    Min 2X/week      PT Plan Current plan remains appropriate    Co-evaluation              AM-PAC PT "6 Clicks" Daily Activity  Outcome Measure  Difficulty turning over in bed (including adjusting bedclothes,  sheets and blankets)?: None Difficulty moving from lying on back to sitting on the side of the bed? : A Little Difficulty sitting down on and standing up from a chair with arms (e.g., wheelchair, bedside commode, etc,.)?: A Little Help needed moving to and from a bed to chair (including a wheelchair)?: A Little Help needed walking in hospital room?: A Little Help needed climbing 3-5 steps with a railing? : A Little 6 Click Score: 19    End of Session Equipment Utilized During Treatment: Gait belt Activity Tolerance: Patient tolerated treatment well Patient left: in chair;with family/visitor present;with call bell/phone within reach         Time: 0855-0905 PT Time Calculation (min) (ACUTE ONLY): 10 min  Charges:  $Gait Training: 8-22 mins                    G Codes:       Chesley Noon, PTA 05/22/17, 9:11  AM

## 2017-05-23 DIAGNOSIS — C569 Malignant neoplasm of unspecified ovary: Secondary | ICD-10-CM | POA: Insufficient documentation

## 2017-05-23 NOTE — Progress Notes (Signed)
[place]  [phone] [phone]  ID: Tamara Mckinney OB: [birth year]  MR#: [id number]  ATF#:[id number]  Patient Care Team: [name], MD as PCP - General (Family Medicine) [name], RN as Registered Nurse  CHIEF COMPLAINT: Stage IIIC ovarian cancer.  INTERVAL HISTORY: Patient is a 83 year old female who recently underwent surgery for rectal prolapse. She is in clinic today for hospital follow-up and treatment planning.  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  CT scan reveal omental caking. She feels improved since discharge. She continues to have mild abdominal bloating, but denies any pain. She has a fair appetite. She has no neurologic complaints. She denies any fevers. She denies any chest pain or shortness of breath. She has no nausea, vomiting, constipation, or diarrhea. She does report some mild heartburn. She has no urinary complaints. Patient offers no further specific complaints.  REVIEW OF SYSTEMS:   Review of Systems  Constitutional: Positive for malaise/fatigue. Negative for fever and weight loss.  Respiratory: Negative.  Negative for cough and shortness of breath.   Cardiovascular: Negative.  Negative for chest pain and leg swelling.  Gastrointestinal: Negative.  Negative for abdominal pain, blood in stool, constipation, diarrhea, melena, nausea and vomiting.  Genitourinary: Negative.   Musculoskeletal: Negative.   Skin: Negative.  Negative for rash.  Neurological: Positive for weakness.  Endo/Heme/Allergies: Does not bruise/bleed easily.  Psychiatric/Behavioral: Negative.  The patient is not nervous/anxious.     As per HPI. Otherwise, a complete review of systems is negative.  PAST MEDICAL HISTORY: Past Medical History:  Diagnosis Date  . Anemia   . Arthritis   . Asthma   . Cancer ([place])   . Dyspnea   . GERD (gastroesophageal reflux disease)   .  Hypertension   . Sjogren's disease ([place])     PAST SURGICAL HISTORY: Past Surgical History:  Procedure Laterality Date  . ABDOMINAL HYSTERECTOMY    . BREAST BIOPSY     x6  . BUNIONECTOMY Bilateral   . CATARACT EXTRACTION, BILATERAL    . FLEXIBLE SIGMOIDOSCOPY N/A 05/21/2017   Procedure: FLEXIBLE SIGMOIDOSCOPY;  Surgeon: Lin Landsman, MD;  Location: [facility] ENDOSCOPY;  Service: Gastroenterology;  Laterality: N/A;  . INCONTINENCE SURGERY    . REPAIR OF RECTAL PROLAPSE N/A 05/11/2017   Procedure: REPAIR OF RECTAL PROLAPSE;  Surgeon: Leonie Green, MD;  Location: [facility] ORS;  Service: General;  Laterality: N/A;  . TONSILLECTOMY    . VAGINA SURGERY     uncertain procedure performed    FAMILY HISTORY: Family History  Problem Relation Age of Onset  . Heart disease Mother   . Heart disease Father   . Heart disease Sister   . Heart attack Sister   . Ulcerative colitis Brother   . Lung cancer Brother   . Thyroid cancer Sister   . Asthma Sister   . Diabetes Sister   . Asthma Sister   . Pancreatic cancer Sister   . Dementia Sister   . Asthma Brother   . Heart disease Brother   . Asthma Brother   . Lung cancer Brother   . Asthma Brother   . Lung cancer Brother   . Lung cancer Brother   . Rheum arthritis Brother     ADVANCED DIRECTIVES (Y/N):  N  HEALTH MAINTENANCE: Social History  Substance Use Topics  . Smoking status: Never Smoker  . Smokeless tobacco: Never Used  . Alcohol use No  Colonoscopy:  PAP:  Bone density:  Lipid panel:  No Known Allergies  Current Outpatient Prescriptions  Medication Sig Dispense Refill  . albuterol (PROVENTIL HFA;VENTOLIN HFA) 108 (90 Base) MCG/ACT inhaler Inhale 1-2 puffs into the lungs every 6 (six) hours as needed for wheezing or shortness of breath.    Marland Kitchen amLODipine (NORVASC) 5 MG tablet Take 5 mg by mouth daily with breakfast.    . Ascorbic Acid (VITAMIN C) 1000 MG tablet Take 1,000 mg by mouth daily.    . Calcium  Carb-Cholecalciferol (CALTRATE 600+D) 600-800 MG-UNIT TABS Take 1 tablet by mouth daily.    . Carboxymeth-Glyc-Polysorb PF (REFRESH OPTIVE ADVANCED PF) 0.5-1-0.5 % SOLN Place 1-2 drops into both eyes 3 (three) times daily as needed. For dry, irritated eyes    . Carboxymethylcellulose Sod PF (REFRESH CELLUVISC) 1 % GEL Place 1 drop into both eyes at bedtime. In the middle of the night     . Cholecalciferol (VITAMIN D3) 2000 units capsule Take 2,000 Units by mouth daily.    . Cyanocobalamin (RA VITAMIN B12) 2000 MCG TBCR Take 2,000 mcg by mouth daily.    Marland Kitchen docusate sodium (COLACE) 100 MG capsule Take 100-300 mg by mouth at bedtime as needed for mild constipation.    Marland Kitchen esomeprazole (NEXIUM) 40 MG capsule Take 40 mg by mouth daily.     . fluticasone furoate-vilanterol (BREO ELLIPTA) 100-25 MCG/INH AEPB Inhale 1 puff into the lungs at bedtime.    . furosemide (LASIX) 40 MG tablet Take 40 mg by mouth daily with breakfast.     . hypromellose (SYSTANE OVERNIGHT THERAPY) 0.3 % GEL ophthalmic ointment Place 1 application into both eyes at bedtime.    Marland Kitchen losartan (COZAAR) 100 MG tablet Take 100 mg by mouth at bedtime.    . meloxicam (MOBIC) 15 MG tablet Take 15 mg by mouth daily with breakfast.    . montelukast (SINGULAIR) 10 MG tablet Take 10 mg by mouth at bedtime.    Marland Kitchen acetaminophen (TYLENOL) 500 MG tablet Take 500 mg by mouth 2 (two) times daily as needed for moderate pain.     . cetirizine (ZYRTEC) 10 MG tablet Take 10 mg by mouth at bedtime as needed for allergies.    . cyclobenzaprine (FLEXERIL) 10 MG tablet Take 10 mg by mouth at bedtime.    . DHA-EPA-Flaxseed Oil-Vitamin E (THERA TEARS NUTRITION PO) Take 2 tablets by mouth daily.    . Glucosamine-MSM-Hyaluronic Acd (JOINT HEALTH PO) Take 1 tablet by mouth daily.    Marland Kitchen ipratropium-albuterol (DUONEB) 0.5-2.5 (3) MG/3ML SOLN Take 3 mLs by nebulization every 6 (six) hours as needed. For wheezing/shortness of breath    . meclizine (ANTIVERT) 25 MG tablet  Take 25 mg by mouth 3 (three) times daily as needed for dizziness.     . ondansetron (ZOFRAN) 8 MG tablet Take 1 tablet (8 mg total) by mouth 2 (two) times daily as needed for refractory nausea / vomiting. 60 tablet 2  . prochlorperazine (COMPAZINE) 10 MG tablet Take 1 tablet (10 mg total) by mouth every 6 (six) hours as needed (Nausea or vomiting). 60 tablet 2  . triamcinolone cream (KENALOG) 0.5 % Apply 1 application topically as needed. For inflammation     No current facility-administered medications for this visit.     OBJECTIVE: Vitals:   05/26/17 1408  BP: 116/71  Pulse: 98  Resp: 18  Temp: 98 F (36.7 C)     Body mass index is 21.27 kg/m.  ECOG FS:1 - Symptomatic but completely ambulatory  General: Well-developed, well-nourished, no acute distress. Eyes: Pink conjunctiva, anicteric sclera. HEENT: Normocephalic, moist mucous membranes, clear oropharnyx. Lungs: Clear to auscultation bilaterally. Heart: Regular rate and rhythm. No rubs, murmurs, or gallops. Abdomen: Soft, nontender, nondistended. No organomegaly noted, normoactive bowel sounds. Musculoskeletal: No edema, cyanosis, or clubbing. Neuro: Alert, answering all questions appropriately. Cranial nerves grossly intact. Skin: No rashes or petechiae noted. Psych: Normal affect. Lymphatics: No cervical, calvicular, axillary or inguinal LAD.   LAB RESULTS:  Lab Results  Component Value Date   NA 133 (L) 05/20/2017   K 4.4 05/20/2017   CL 98 (L) 05/20/2017   CO2 23 05/20/2017   GLUCOSE 79 05/20/2017   BUN 43 (H) 05/20/2017   CREATININE 1.44 (H) 05/20/2017   CALCIUM 8.3 (L) 05/20/2017   PROT 6.3 (L) 05/19/2017   ALBUMIN 2.6 (L) 05/19/2017   AST 29 05/19/2017   ALT 13 (L) 05/19/2017   ALKPHOS 77 05/19/2017   BILITOT 0.5 05/19/2017   GFRNONAA 32 (L) 05/20/2017   GFRAA 37 (L) 05/20/2017    Lab Results  Component Value Date   WBC 12.1 (H) 05/21/2017   HGB 9.5 (L) 05/21/2017   HCT 27.1 (L) 05/21/2017    MCV 87.0 05/21/2017   PLT 437 05/21/2017     STUDIES: Ct Abdomen Pelvis W Contrast  Result Date: 05/14/2017 CLINICAL DATA:  Abdominal mass. EXAM: CT ABDOMEN AND PELVIS WITH CONTRAST TECHNIQUE: Multidetector CT imaging of the abdomen and pelvis was performed using the standard protocol following bolus administration of intravenous contrast. CONTRAST:  72mL ISOVUE-300 IOPAMIDOL (ISOVUE-300) INJECTION 61% COMPARISON:  None. FINDINGS: Lower chest: Small bilateral pleural effusions. Moderate hiatal hernia with about 50% of the stomach in the chest. 2.1 x 3.7 cm abnormal soft tissue mass is identified in the hiatal hernia (image 7 series 2). Hepatobiliary: Multiple soft tissue masses are seen along the liver capsule and in the right infra diaphragmatic space. Index lesion along the posteromedial right lobe measures 5.8 x 2.0 cm on image 24 series 2. There is no evidence for gallstones, gallbladder wall thickening, or pericholecystic fluid. No intrahepatic or extrahepatic biliary dilation. Pancreas: No focal mass lesion. No dilatation of the main duct. No intraparenchymal cyst. No peripancreatic edema. Spleen: Calcified granuloma noted in the spleen. Adrenals/Urinary Tract: No adrenal nodule or mass. Kidneys unremarkable. No evidence for hydroureter. Bladder is moderately distended. Stomach/Bowel: Hiatal hernia as described above. Large duodenal diverticulum evident. No evidence for small bowel obstruction. Terminal ileum not well seen, but the ileocecal valve and terminal ileum visible on image 21 of series 8. Appendix is visualized on image 25 of coronal series 8 and is unremarkable. The colon is diffusely distended with stool although left colon is decompressed. Suture line noted in the region of the anal rectal junction. Vascular/Lymphatic: There is abdominal aortic atherosclerosis without aneurysm. See "Other" section in report below. Reproductive: Uterus appears to be surgically absent. 19 mm cystic lesion  identified in the left adnexal space. No right adnexal mass. Other: Small to moderate volume ascites identified. Peritoneal, omental, and mesenteric nodularity is compatible with metastatic disease. 4.1 x 8.2 cm left omental cake is identified on image 40 of series 2. 7.4 x 2.8 cm omental cake is identified in the splenic flexure (image 19 series 2). Numerous other areas of omental and peritoneal nodularity are evident. Loculated fluid with peritoneal nodularity is identified in the cul-de-sac. 2.4 x 3.3 cm soft tissue nodule appears to invade the posterior right  bladder wall (image 61 series 2). 4.7 x 1.7 cm nodal conglomeration or abnormal soft tissue mass identified in the porta hepatis. This abnormal soft tissue tracks cranially up along the IVC and liver capsule, generating marked mass-effect on the intrahepatic segment of the IVC. Musculoskeletal: Convex rightward thoracolumbar scoliosis is associated with compensatory rightward scoliosis of the lower lumbar spine. IMPRESSION: 1. Small to moderate volume ascites with bulky omental, peritoneal, and mesenteric soft tissue compatible with metastatic disease. There are multiple peritoneal nodules with fluid in the cul-de-sac. One of these nodules appears to involve the posterior right bladder wall. Primary source of neoplasm not evident on this exam. 2. Bulky metastatic disease in the porta hepatis tracking up along the medial liver capsule generates substantial mass-effect on the intrahepatic IVC. 3. Right and transverse segments of the colon are distended up to 6 cm diameter in stool-filled. Left colon is decompressed. These changes may be related to constipation, but colonic obstruction not entirely excluded. There is no associated small bowel dilatation. 4. Diffuse body wall edema. 5.  Aortic Atherosclerois (ICD10-170.0) Electronically Signed   By: Misty Stanley M.D.   On: 05/14/2017 16:04   Dg Abd 2 Views  Result Date: 05/19/2017 CLINICAL DATA:  History  of surgery for rectal prolapse, near syncopal episode EXAM: ABDOMEN - 2 VIEW COMPARISON:  None. FINDINGS: Supine and upright views of the abdomen. Moderate hiatal hernia. No free air beneath the diaphragm. Nonobstructed gas pattern with large amount of stool in the colon. Surgical sutures in the region of the rectum. Calcified pelvic phleboliths. Scoliosis of the spine. IMPRESSION: 1. Nonobstructed bowel-gas pattern with large amount of stool in the colon. Negative for free air. 2. Moderate hiatal hernia. Electronically Signed   By: Donavan Foil M.D.   On: 05/19/2017 15:07    ASSESSMENT: Stage IIIC ovarian cancer.  PLAN:    1. Stage IIIC ovarian cancer: Given patient's advanced age she is not a surgical candidate. CT scan results reviewed independently confirming stage of disease. After lengthy discussion with the patient, she wishes to try palliative chemotherapy using single agent carboplatinum. Will not get a port at this time, but patient may consider one in the future. Return to clinic in 1 week for further evaluation and consideration of cycle 1. Plan to reimage at the conclusion of cycle 4. 2. Lupus anticoagulant: Patient was noted to have an elevated PTT as well as increased bleeding during her surgery for rectal prolapse. Continue to monitor closely and repeat lupus anticoagulant panel in 10-12 weeks. 3. Anemia: Patient's hemoglobin is improving. Monitor. She was noted to have iron deficiency and we will give 510 mg IV Feraheme 2 with her chemotherapy.   Approximately 30 minutes was spent in discussion of which greater than 50% was consultation.  Patient expressed understanding and was in agreement with this plan. She also understands that She can call clinic at any time with any questions, concerns, or complaints.   Cancer Staging Malignant neoplasm of ovary [facility]) Staging form: Ovary, Fallopian Tube, and Primary Peritoneal Carcinoma, AJCC 8th Edition - Clinical stage from 05/29/2017:  Stage IIIC (cT3c, cN1b, cM0) - Signed by Lloyd Huger, MD on 05/29/2017   Lloyd Huger, MD   05/29/2017 8:42 AM

## 2017-05-24 ENCOUNTER — Encounter: Payer: Self-pay | Admitting: Gastroenterology

## 2017-05-24 ENCOUNTER — Ambulatory Visit: Payer: Self-pay | Admitting: Urology

## 2017-05-24 LAB — VON WILLEBRAND PANEL
Coagulation Factor VIII: 115 % (ref 57–163)
Ristocetin Co-factor, Plasma: 217 % — ABNORMAL HIGH (ref 50–200)
Von Willebrand Antigen, Plasma: 251 % — ABNORMAL HIGH (ref 50–200)

## 2017-05-24 LAB — FOLATE RBC
Folate, Hemolysate: 417.9 ng/mL
Folate, RBC: 1727 ng/mL (ref >498)
Hematocrit: 24.2 % — ABNORMAL LOW (ref 34.0–46.6)

## 2017-05-24 LAB — COAG STUDIES INTERP REPORT

## 2017-05-24 NOTE — Discharge Summary (Signed)
Tamara Mckinney at [place] NAME: Tamara Mckinney    MR#:  [id number]  DATE OF BIRTH:  [birth year]  DATE OF ADMISSION:  05/19/2017 ADMITTING PHYSICIAN: Vaughan Basta, MD  DATE OF DISCHARGE: 05/22/2017 12:13 PM  PRIMARY CARE PHYSICIAN: [name], MD   ADMISSION DIAGNOSIS:  Vomiting [R11.10] Rectal bleeding [K62.5] Anemia due to acute blood loss [D62]  DISCHARGE DIAGNOSIS:  Principal Problem:   Acute posthemorrhagic anemia Active Problems:   Acute renal failure (ARF) ([facility])   Anemia   SECONDARY DIAGNOSIS:   Past Medical History:  Diagnosis Date  . Anemia   . Arthritis   . Asthma   . Cancer ([place])   . Dyspnea   . GERD (gastroesophageal reflux disease)   . Hypertension   . Sjogren's disease ([place])      ADMITTING HISTORY  HISTORY OF PRESENT ILLNESS: Tamara Mckinney  is a 83 y.o. female with a known history of a recent problem with symptomatic rectal prolapse. She also has a history of chronic anemia. She had surgery last week with excision of prolapsed rectum. Postoperatively she did have some rectal bleeding and acute anemia and did have 2 blood transfusions. She also had pathology findings of malignant cells on the external surface of the rectum. 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  She was discharged after several days in the hospital. She was on her way to the office for follow-up this morning when she felt dizzy and was brought into the emergency room. Initial blood pressure was 96/46. She reports feeling dizzy and weak. She also notes that she has had some bowel movements passing some blood with her bowel movements. She also has had nausea and has had minimal degree of vomiting including some bile-stained fluid. She reports having a headache in the right frontal area. She reports breathing satisfactorily. She reports some abdominal distention. He does have some chronic mild  difficulty emptying her bladder.  2 units PRBC ordered, Noted to be in Ac renal failure.  HOSPITAL COURSE:   JeanLangleyis a 83 y.o.femalewith a known history of a recent problem with symptomatic rectal prolapse. She also has a history of chronic anemia. She had surgery last week with excision of prolapsed rectum. Postoperatively she did have some rectal bleeding and acute anemia and did have 2 blood transfusions. She also had pathology findings of malignant cells on the external surface of the rectum. 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  * Symptomatic anemia due to rectal bleeding -came in with Hgb 6.3. Received 2 units and and HB improved to 9.5 -Recent sx, rectal exam had mucous + blood as per Dr. Rochel Brome. - Flex sig showed a small ulcer but no bleeding. Old clots.  * Isolated elevated ApTT etiology von willibrand panel and lupus anticoagulant. Labs  sent by Dr Grayland Ormond and pending  * Acute renal failure Due to dehydration has resolved  * Gyn malignancy--new  -Metastatic to other organs and omentum- found last week. -Dr Grayland Ormond saw the patient. She has appointment scheduled for 05/26/2017  * Hyperkalemia--resolved -Due to renal failure -K 4.4  *DVT prophylaxis -SCD and TEDS   Patient's hemoglobin is stable. No further bleeding. Discussed with Dr. Jamal Collin. Discharge home. Follow-up with oncology and surgery.  CONSULTS OBTAINED:  Treatment Team:  Lin Landsman, MD Leonie Green, MD  DRUG ALLERGIES:  No Known Allergies  DISCHARGE MEDICATIONS:   Discharge Medication List  as of 05/22/2017 11:47 AM    CONTINUE these medications which have NOT CHANGED   Details  acetaminophen (TYLENOL) 500 MG tablet Take 500 mg by mouth 2 (two) times daily as needed for moderate pain. , Historical Med    albuterol (PROVENTIL HFA;VENTOLIN HFA) 108 (90 Base) MCG/ACT inhaler Inhale 1-2 puffs into the  lungs every 6 (six) hours as needed for wheezing or shortness of breath., Historical Med    amLODipine (NORVASC) 5 MG tablet Take 5 mg by mouth daily with breakfast., Historical Med    Ascorbic Acid (VITAMIN C) 1000 MG tablet Take 1,000 mg by mouth daily., Historical Med    Calcium Carb-Cholecalciferol (CALTRATE 600+D) 600-800 MG-UNIT TABS Take 1 tablet by mouth daily., Historical Med    Carboxymeth-Glyc-Polysorb PF (REFRESH OPTIVE ADVANCED PF) 0.5-1-0.5 % SOLN Place 1-2 drops into both eyes 3 (three) times daily as needed. For dry, irritated eyes, Historical Med    Carboxymethylcellulose Sod PF (REFRESH CELLUVISC) 1 % GEL Place 1 drop into both eyes at bedtime. In the middle of the night , Historical Med    cetirizine (ZYRTEC) 10 MG tablet Take 10 mg by mouth at bedtime as needed for allergies., Historical Med    Cholecalciferol (VITAMIN D3) 2000 units capsule Take 2,000 Units by mouth daily., Historical Med    Cyanocobalamin (RA VITAMIN B12) 2000 MCG TBCR Take 2,000 mcg by mouth daily., Historical Med    cyclobenzaprine (FLEXERIL) 10 MG tablet Take 10 mg by mouth at bedtime., Historical Med    DHA-EPA-Flaxseed Oil-Vitamin E (THERA TEARS NUTRITION PO) Take 2 tablets by mouth daily., Historical Med    docusate sodium (COLACE) 100 MG capsule Take 100-300 mg by mouth at bedtime as needed for mild constipation., Historical Med    esomeprazole (NEXIUM) 40 MG capsule Take 40 mg by mouth daily. , Historical Med    fluticasone furoate-vilanterol (BREO ELLIPTA) 100-25 MCG/INH AEPB Inhale 1 puff into the lungs at bedtime., Historical Med    furosemide (LASIX) 40 MG tablet Take 20 mg by mouth daily with breakfast. , Historical Med    Glucosamine-MSM-Hyaluronic Acd (JOINT HEALTH PO) Take 1 tablet by mouth daily., Historical Med    hypromellose (SYSTANE OVERNIGHT THERAPY) 0.3 % GEL ophthalmic ointment Place 1 application into both eyes at bedtime., Historical Med    ipratropium-albuterol  (DUONEB) 0.5-2.5 (3) MG/3ML SOLN Take 3 mLs by nebulization every 6 (six) hours as needed. For wheezing/shortness of breath, Historical Med    losartan (COZAAR) 100 MG tablet Take 100 mg by mouth at bedtime., Historical Med    meclizine (ANTIVERT) 25 MG tablet Take 25 mg by mouth 3 (three) times daily as needed for dizziness. , Historical Med    meloxicam (MOBIC) 15 MG tablet Take 15 mg by mouth daily with breakfast., Historical Med    montelukast (SINGULAIR) 10 MG tablet Take 10 mg by mouth at bedtime., Historical Med    triamcinolone cream (KENALOG) 0.5 % Apply 1 application topically as needed. For inflammation, Historical Med        Today   VITAL SIGNS:  Blood pressure 130/75, pulse (!) 102, temperature 98 F (36.7 C), resp. rate 20, height 4\' 9"  (1.448 m), weight 47.5 kg (104 lb 12.8 oz), SpO2 98 %.  I/O:  No intake or output data in the 24 hours ending 05/24/17 1342  PHYSICAL EXAMINATION:  Physical Exam  GENERAL:  83 y.o.-year-old patient lying in the bed with no acute distress.  LUNGS: Normal breath sounds bilaterally, no wheezing,  rales,rhonchi or crepitation. No use of accessory muscles of respiration.  CARDIOVASCULAR: S1, S2 normal. No murmurs, rubs, or gallops.  ABDOMEN: Soft, non-tender, non-distended. Bowel sounds present. No organomegaly or mass.  NEUROLOGIC: Moves all 4 extremities. PSYCHIATRIC: The patient is alert and oriented x 3.  SKIN: No obvious rash, lesion, or ulcer.   DATA REVIEW:   CBC  Recent Labs Lab 05/21/17 0534  05/21/17 2001  WBC 12.1*  --   --   HGB 7.6*  < > 9.5*  HCT 22.2*  --  27.1*  PLT 437  --   --   < > = values in this interval not displayed.  Chemistries   Recent Labs Lab 05/19/17 1124 05/20/17 0600  NA 129* 133*  K 5.1 4.4  CL 95* 98*  CO2 23 23  GLUCOSE 116* 79  BUN 51* 43*  CREATININE 1.94* 1.44*  CALCIUM 8.2* 8.3*  MG  --  2.2  AST 29  --   ALT 13*  --   ALKPHOS 77  --   BILITOT 0.5  --     Cardiac  Enzymes No results for input(s): TROPONINI in the last 168 hours.  Microbiology Results  No results found for this or any previous visit.  RADIOLOGY:  No results found.  Follow up with PCP in 1 week.  Management plans discussed with the patient, family and they are in agreement.  CODE STATUS:  Code Status History    Date Active Date Inactive Code Status Order ID Comments User Context   05/22/2017 10:36 AM 05/22/2017  3:35 PM DNR [id number]  Hillary Bow, MD Inpatient   05/19/2017  4:03 PM 05/22/2017 10:36 AM Full Code [id number]  Vaughan Basta, MD Inpatient   05/14/2017  1:41 PM 05/15/2017  7:39 PM DNR [id number]  Hillary Bow, MD Inpatient   05/11/2017  2:37 PM 05/14/2017  1:41 PM Full Code [id number]  Leonie Green, MD Inpatient    Questions for Most Recent Historical Code Status (Order [id number])    Question Answer Comment   In the event of cardiac or respiratory ARREST Do not call a "code blue"    In the event of cardiac or respiratory ARREST Do not perform Intubation, CPR, defibrillation or ACLS    In the event of cardiac or respiratory ARREST Use medication by any route, position, wound care, and other measures to relive pain and suffering. May use oxygen, suction and manual treatment of airway obstruction as needed for comfort.         Advance Directive Documentation     Most Recent Value  Type of Advance Directive  Healthcare Power of Attorney, Living will  Pre-existing out of facility DNR order (yellow form or pink MOST form)  -  "MOST" Form in Place?  -      TOTAL TIME TAKING CARE OF THIS PATIENT ON DAY OF DISCHARGE: more than 30 minutes.   Hillary Bow R M.D on 05/24/2017 at 1:42 PM  Between 7am to 6pm - Pager - [phone]  After 6pm go to [email or web address] - password [email or web address] [place] [facility]  Office  [phone]  CC: Primary care physician; [name], MD  Note: This dictation was prepared with Dragon dictation along with  smaller phrase technology. Any transcriptional errors that result from this process are unintentional.

## 2017-05-25 LAB — LUPUS ANTICOAGULANT PANEL
DRVVT: 36 s (ref 0.0–47.0)
PTT Lupus Anticoagulant: 70.1 s — ABNORMAL HIGH (ref 0.0–51.9)

## 2017-05-25 LAB — PTT-LA MIX: PTT-LA Mix: 64.9 s — ABNORMAL HIGH (ref 0.0–48.9)

## 2017-05-25 LAB — HEXAGONAL PHASE PHOSPHOLIPID: Hexagonal Phase Phospholipid: 22 s — ABNORMAL HIGH (ref 0–11)

## 2017-05-25 NOTE — Progress Notes (Signed)
Gynecologic Oncology Consult Visit   Referring Provider: Dr. 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   Subjective:  Tamara Mckinney is a 83 y.o. female who is seen in consultation from Dr. 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   She presented with rectal prolapse and on underwent surgery on 05/11/2017  for rectal prolapse and excision of prolapsed rectum performed.  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    05/14/2017 CT A/P IMPRESSION: 1. Small to moderate volume ascites with bulky omental, peritoneal, and mesenteric soft tissue compatible with metastatic disease. There are multiple peritoneal nodules with fluid in the cul-de-sac. One of these nodules appears to involve the posterior right bladder wall. Primary source of neoplasm not evident on this exam. 2. Bulky metastatic disease in the porta hepatis tracking up along the medial liver capsule generates substantial mass-effect on the intrahepatic IVC. 3. Right and transverse segments of the colon are distended up to 6 cm diameter in stool-filled. Left colon is decompressed. These changes may be related to constipation, but colonic obstruction not entirely excluded. There is no associated small bowel dilatation. 4. Diffuse body wall edema. 5.  Aortic Atherosclerois (ICD10-170.0)  DIAGNOSIS:  A. PARTIAL RECTUM; RESECTION:  - METASTATIC HIGH GRADE CARCINOMA MORPHOLOGICALLY COMPATIBLE WITH SEROUS  CARCINOMA OF GYNECOLOGIC ORIGIN.  - MILD ACTIVE COLITIS AND FEATURES OF ISCHEMIA.   Note: A high-grade papillary carcinoma is seen studding the serosal  surface of the rectum. Immunostains were performed for further  characterization. PAX 8 and WT1 are reactive. CK7 shows focal weak  positivity (noncontributory) while CK20 and CDX2 are negative. The  control slides  stained appropriately. The morphology and pattern of  immunostaining support the diagnosis. Sufficient material is present  for ancillary studies.    She was readmitted to the hospital with hypotension. Evaluation revealed anemia with Hb 6.3 and rectal bleeding. She received transfusion PRBC. Flex sigmoidoscopy revealed small ulcer but no bleeding. She also had an insolated elevated APTT work up in progress with Dr. Grayland Ormond.   She presents today to review is there is any surgical indication for management of newly diagnosed advanced ovarian cancer.    Problem List: Patient Active Problem List   Diagnosis Date Noted  . Malignant neoplasm of ovary ([place]) 05/23/2017  . Acute posthemorrhagic anemia 05/19/2017  . Acute renal failure (ARF) ([place]) 05/19/2017  . Anemia 05/19/2017  . Rectal prolapse 05/11/2017    Past Medical History: Past Medical History:  Diagnosis Date  . Anemia   . Arthritis   . Asthma   . Cancer ([place])   . Dyspnea   . GERD (gastroesophageal reflux disease)   . Hypertension   . Sjogren's disease [facility])     Past Surgical History: Past Surgical History:  Procedure Laterality Date  . ABDOMINAL HYSTERECTOMY    . BREAST BIOPSY     x6  . BUNIONECTOMY Bilateral   . CATARACT EXTRACTION, BILATERAL    . FLEXIBLE SIGMOIDOSCOPY N/A 05/21/2017   Procedure: FLEXIBLE SIGMOIDOSCOPY;  Surgeon: Lin Landsman, MD;  Location: [facility] ENDOSCOPY;  Service: Gastroenterology;  Laterality: N/A;  . INCONTINENCE SURGERY    . REPAIR OF RECTAL PROLAPSE N/A 05/11/2017   Procedure: REPAIR OF RECTAL PROLAPSE;  Surgeon: Leonie Green, MD;  Location: [facility] ORS;  Service: General;  Laterality: N/A;  . TONSILLECTOMY    . VAGINA SURGERY  uncertain procedure performed    Past Gynecologic History:  As per interval history. S/p hysterectomy at 83 years old.   OB History: at least G2P2 OB History  No data available    Family History: Family History  Problem Relation Age of  Onset  . Heart disease Mother   . Heart disease Father   . Heart disease Sister   . Heart attack Sister   . Ulcerative colitis Brother   . Lung cancer Brother   . Thyroid cancer Sister   . Asthma Sister   . Diabetes Sister   . Asthma Sister   . Pancreatic cancer Sister   . Dementia Sister   . Asthma Brother   . Heart disease Brother   . Asthma Brother   . Lung cancer Brother   . Asthma Brother   . Lung cancer Brother   . Lung cancer Brother   . Rheum arthritis Brother     Social History: Social History   Social History  . Marital status: Widowed    Spouse name: N/A  . Number of children: N/A  . Years of education: N/A   Occupational History  . Not on file.   Social History Main Topics  . Smoking status: Never Smoker  . Smokeless tobacco: Never Used  . Alcohol use No  . Drug use: No  . Sexual activity: No   Other Topics Concern  . Not on file   Social History Narrative  . No narrative on file    Allergies: No Known Allergies  Current Medications: Current Outpatient Prescriptions  Medication Sig Dispense Refill  . acetaminophen (TYLENOL) 500 MG tablet Take 500 mg by mouth 2 (two) times daily as needed for moderate pain.     Marland Kitchen albuterol (PROVENTIL HFA;VENTOLIN HFA) 108 (90 Base) MCG/ACT inhaler Inhale 1-2 puffs into the lungs every 6 (six) hours as needed for wheezing or shortness of breath.    Marland Kitchen amLODipine (NORVASC) 5 MG tablet Take 5 mg by mouth daily with breakfast.    . Ascorbic Acid (VITAMIN C) 1000 MG tablet Take 1,000 mg by mouth daily.    . Calcium Carb-Cholecalciferol (CALTRATE 600+D) 600-800 MG-UNIT TABS Take 1 tablet by mouth daily.    . Carboxymeth-Glyc-Polysorb PF (REFRESH OPTIVE ADVANCED PF) 0.5-1-0.5 % SOLN Place 1-2 drops into both eyes 3 (three) times daily as needed. For dry, irritated eyes    . Carboxymethylcellulose Sod PF (REFRESH CELLUVISC) 1 % GEL Place 1 drop into both eyes at bedtime. In the middle of the night     . cetirizine  (ZYRTEC) 10 MG tablet Take 10 mg by mouth at bedtime as needed for allergies.    . Cholecalciferol (VITAMIN D3) 2000 units capsule Take 2,000 Units by mouth daily.    . Cyanocobalamin (RA VITAMIN B12) 2000 MCG TBCR Take 2,000 mcg by mouth daily.    . cyclobenzaprine (FLEXERIL) 10 MG tablet Take 10 mg by mouth at bedtime.    . DHA-EPA-Flaxseed Oil-Vitamin E (THERA TEARS NUTRITION PO) Take 2 tablets by mouth daily.    Marland Kitchen docusate sodium (COLACE) 100 MG capsule Take 100-300 mg by mouth at bedtime as needed for mild constipation.    Marland Kitchen esomeprazole (NEXIUM) 40 MG capsule Take 40 mg by mouth daily.     . fluticasone furoate-vilanterol (BREO ELLIPTA) 100-25 MCG/INH AEPB Inhale 1 puff into the lungs at bedtime.    . furosemide (LASIX) 40 MG tablet Take 40 mg by mouth daily with breakfast.     .  Glucosamine-MSM-Hyaluronic Acd (JOINT HEALTH PO) Take 1 tablet by mouth daily.    . hypromellose (SYSTANE OVERNIGHT THERAPY) 0.3 % GEL ophthalmic ointment Place 1 application into both eyes at bedtime.    Marland Kitchen ipratropium-albuterol (DUONEB) 0.5-2.5 (3) MG/3ML SOLN Take 3 mLs by nebulization every 6 (six) hours as needed. For wheezing/shortness of breath    . losartan (COZAAR) 100 MG tablet Take 100 mg by mouth at bedtime.    . meclizine (ANTIVERT) 25 MG tablet Take 25 mg by mouth 3 (three) times daily as needed for dizziness.     . meloxicam (MOBIC) 15 MG tablet Take 15 mg by mouth daily with breakfast.    . montelukast (SINGULAIR) 10 MG tablet Take 10 mg by mouth at bedtime.    . triamcinolone cream (KENALOG) 0.5 % Apply 1 application topically as needed. For inflammation     No current facility-administered medications for this visit.     General: no complaints  HEENT: cataracts, vision changes, hearing changes, sinus drainage  Lungs: SOB  Cardiac: no complaints  GI: constipation, indigestion  GU: no complaints  Musculoskeletal: arthritis, osteoporosis  Extremities: no complaints  Skin: no complaints   Neuro: dizziness, memory changes, migraines, numbness/tingling  Endocrine: no complaints  Psych: no complaints Heme: easy bruising       Objective:  Physical Examination:  BP 116/71 (BP Location: Right Arm, Patient Position: Sitting)   Pulse 96   Temp 98 F (36.7 C) (Tympanic)   Resp 18   Ht 4\' 9"  (1.448 m)   Wt 98 lb (44.5 kg)   BMI 21.21 kg/m    ECOG Performance Status: 2 - Symptomatic, <50% confined to bed  General appearance: alert, cooperative and appears stated age HEENT: ATNC Lymph node survey: non-palpable, axillary, inguinal, supraclavicular Cardiovascular: RRR Respiratory: B CTA. Abdomen: Soft NT but distended. No hernias. Palpable 10-12 mass in LLQ, no ascites.  Extremities:No edema Neurological exam reveals alert, oriented, normal speech, no focal findings or movement disorder noted  Pelvic: exam chaperoned by nurse. Vulva: normal appearing vulva with no masses, tenderness or lesions; Vagina: agglutinated suspect colpocleisis surgery, cylinder only 4 cm depth. Adnexa: unable to palpate exam limited by anatomy; Uterus and Cervix: surgically absent; Rectal: not performed as patient has incontinence issues after rectal prolapse surgery. .  Lab Review reviewed  Radiologic Imaging: Images reviewed.    Assessment:  Tamara Mckinney is a 83 y.o. 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 S/p excision of prolapsed rectum on 05/11/2017.   Medical co-morbidities complicating care: anemia, acute renal failure, GERD, Sjogren's disease and HTN.     Plan:   Problem List Items Addressed This Visit      Genitourinary   Malignant neoplasm of ovary ([place]) - Primary      We discussed options for management including chermotherapy which she will review with Dr. Grayland Ormond. She is very interested in preserving  quality of life and she wants to avoid toxicity. We discussed by primary surgical debulking is not an option due to extensive disease including porta hepatitis, recovery from recent surgery, and frailty.   Genetic testing per Dr. Gary Fleet discretion.   Suggested return to clinic as needed   The patient's diagnosis, an outline of the further diagnostic and laboratory studies which will be required, the recommendation, and alternatives were discussed.  All questions were answered to the patient's satisfaction.   SECORD,ANGELES  Philbert Riser, MD    CC:  Dr. Alyssa Grove

## 2017-05-26 ENCOUNTER — Telehealth: Payer: Self-pay | Admitting: Oncology

## 2017-05-26 ENCOUNTER — Inpatient Hospital Stay (HOSPITAL_BASED_OUTPATIENT_CLINIC_OR_DEPARTMENT_OTHER): Payer: Medicare Other | Admitting: Obstetrics and Gynecology

## 2017-05-26 ENCOUNTER — Encounter: Payer: Self-pay | Admitting: Oncology

## 2017-05-26 ENCOUNTER — Inpatient Hospital Stay: Payer: Medicare Other | Attending: Oncology | Admitting: Oncology

## 2017-05-26 VITALS — BP 116/71 | HR 96 | Temp 98.0°F | Resp 18 | Ht <= 58 in | Wt 98.0 lb

## 2017-05-26 VITALS — BP 116/71 | HR 98 | Temp 98.0°F | Resp 18 | Ht <= 58 in | Wt 98.3 lb

## 2017-05-26 DIAGNOSIS — D509 Iron deficiency anemia, unspecified: Secondary | ICD-10-CM | POA: Insufficient documentation

## 2017-05-26 DIAGNOSIS — R12 Heartburn: Secondary | ICD-10-CM | POA: Insufficient documentation

## 2017-05-26 DIAGNOSIS — D62 Acute posthemorrhagic anemia: Secondary | ICD-10-CM

## 2017-05-26 DIAGNOSIS — M3500 Sicca syndrome, unspecified: Secondary | ICD-10-CM

## 2017-05-26 DIAGNOSIS — Z5111 Encounter for antineoplastic chemotherapy: Secondary | ICD-10-CM | POA: Insufficient documentation

## 2017-05-26 DIAGNOSIS — C569 Malignant neoplasm of unspecified ovary: Secondary | ICD-10-CM

## 2017-05-26 DIAGNOSIS — M419 Scoliosis, unspecified: Secondary | ICD-10-CM | POA: Insufficient documentation

## 2017-05-26 DIAGNOSIS — I10 Essential (primary) hypertension: Secondary | ICD-10-CM | POA: Insufficient documentation

## 2017-05-26 DIAGNOSIS — R42 Dizziness and giddiness: Secondary | ICD-10-CM | POA: Diagnosis not present

## 2017-05-26 DIAGNOSIS — R160 Hepatomegaly, not elsewhere classified: Secondary | ICD-10-CM | POA: Insufficient documentation

## 2017-05-26 DIAGNOSIS — D6862 Lupus anticoagulant syndrome: Secondary | ICD-10-CM | POA: Insufficient documentation

## 2017-05-26 DIAGNOSIS — D649 Anemia, unspecified: Secondary | ICD-10-CM | POA: Insufficient documentation

## 2017-05-26 DIAGNOSIS — R110 Nausea: Secondary | ICD-10-CM | POA: Diagnosis not present

## 2017-05-26 DIAGNOSIS — E860 Dehydration: Secondary | ICD-10-CM | POA: Insufficient documentation

## 2017-05-26 DIAGNOSIS — K219 Gastro-esophageal reflux disease without esophagitis: Secondary | ICD-10-CM

## 2017-05-26 DIAGNOSIS — R531 Weakness: Secondary | ICD-10-CM

## 2017-05-26 DIAGNOSIS — C786 Secondary malignant neoplasm of retroperitoneum and peritoneum: Secondary | ICD-10-CM

## 2017-05-26 DIAGNOSIS — M1990 Unspecified osteoarthritis, unspecified site: Secondary | ICD-10-CM | POA: Insufficient documentation

## 2017-05-26 DIAGNOSIS — R5383 Other fatigue: Secondary | ICD-10-CM | POA: Diagnosis not present

## 2017-05-26 DIAGNOSIS — R630 Anorexia: Secondary | ICD-10-CM | POA: Diagnosis not present

## 2017-05-26 DIAGNOSIS — R5381 Other malaise: Secondary | ICD-10-CM | POA: Diagnosis not present

## 2017-05-26 DIAGNOSIS — Z801 Family history of malignant neoplasm of trachea, bronchus and lung: Secondary | ICD-10-CM

## 2017-05-26 DIAGNOSIS — K623 Rectal prolapse: Secondary | ICD-10-CM | POA: Diagnosis not present

## 2017-05-26 DIAGNOSIS — Z9889 Other specified postprocedural states: Secondary | ICD-10-CM | POA: Insufficient documentation

## 2017-05-26 DIAGNOSIS — Z800 Family history of malignant neoplasm of digestive organs: Secondary | ICD-10-CM | POA: Insufficient documentation

## 2017-05-26 DIAGNOSIS — R791 Abnormal coagulation profile: Secondary | ICD-10-CM | POA: Insufficient documentation

## 2017-05-26 DIAGNOSIS — R188 Other ascites: Secondary | ICD-10-CM | POA: Insufficient documentation

## 2017-05-26 DIAGNOSIS — Z7189 Other specified counseling: Secondary | ICD-10-CM

## 2017-05-26 DIAGNOSIS — Z79899 Other long term (current) drug therapy: Secondary | ICD-10-CM | POA: Insufficient documentation

## 2017-05-26 DIAGNOSIS — K449 Diaphragmatic hernia without obstruction or gangrene: Secondary | ICD-10-CM | POA: Insufficient documentation

## 2017-05-26 NOTE — Progress Notes (Signed)
Patient here for initial visit. She has been bloated, feeling pressure, for the past 3 months, She had rectal prolapse surgery in 05/11/17. She has had some rectal bleeding since her surgery.

## 2017-05-26 NOTE — Progress Notes (Signed)
Here for hospital f/u. In pt @ [place]. Then oct 3-6th. S/b Dr Grayland Ormond while in pt.

## 2017-05-26 NOTE — Telephone Encounter (Signed)
Chemo Edu scheduled for 06/01/17, per 05/26/17 los. Lab/MD/ [facility] (NEW) scheduled for 06/03/17 (verbal per Kendra/Finn n/a on 10/17) per 05/26/17 los.

## 2017-05-26 NOTE — Progress Notes (Signed)
  Oncology Nurse Navigator Documentation Chaperoned pelvic exam. Navigator Location: [facility] (05/26/17 1500)   )Navigator Encounter Type: Initial GynOnc (05/26/17 1500)                     Patient Visit Type: GynOnc (05/26/17 1500)                              Time Spent with Patient: 15 (05/26/17 1500)

## 2017-05-27 NOTE — Patient Instructions (Signed)
Carboplatin injection What is this medicine? CARBOPLATIN (KAR boe pla tin) is a chemotherapy drug. It targets fast dividing cells, like cancer cells, and causes these cells to die. This medicine is used to treat ovarian cancer and many other cancers. This medicine may be used for other purposes; ask your health care provider or pharmacist if you have questions. COMMON BRAND NAME(S): Paraplatin What should I tell my health care provider before I take this medicine? They need to know if you have any of these conditions: -blood disorders -hearing problems -kidney disease -recent or ongoing radiation therapy -an unusual or allergic reaction to carboplatin, cisplatin, other chemotherapy, other medicines, foods, dyes, or preservatives -pregnant or trying to get pregnant -breast-feeding How should I use this medicine? This drug is usually given as an infusion into a vein. It is administered in a hospital or clinic by a specially trained health care professional. Talk to your pediatrician regarding the use of this medicine in children. Special care may be needed. Overdosage: If you think you have taken too much of this medicine contact a poison control center or emergency room at once. NOTE: This medicine is only for you. Do not share this medicine with others. What if I miss a dose? It is important not to miss a dose. Call your doctor or health care professional if you are unable to keep an appointment. What may interact with this medicine? -medicines for seizures -medicines to increase blood counts like filgrastim, pegfilgrastim, sargramostim -some antibiotics like amikacin, gentamicin, neomycin, streptomycin, tobramycin -vaccines Talk to your doctor or health care professional before taking any of these medicines: -acetaminophen -aspirin -ibuprofen -ketoprofen -naproxen This list may not describe all possible interactions. Give your health care provider a list of all the medicines, herbs,  non-prescription drugs, or dietary supplements you use. Also tell them if you smoke, drink alcohol, or use illegal drugs. Some items may interact with your medicine. What should I watch for while using this medicine? Your condition will be monitored carefully while you are receiving this medicine. You will need important blood work done while you are taking this medicine. This drug may make you feel generally unwell. This is not uncommon, as chemotherapy can affect healthy cells as well as cancer cells. Report any side effects. Continue your course of treatment even though you feel ill unless your doctor tells you to stop. In some cases, you may be given additional medicines to help with side effects. Follow all directions for their use. Call your doctor or health care professional for advice if you get a fever, chills or sore throat, or other symptoms of a cold or flu. Do not treat yourself. This drug decreases your body's ability to fight infections. Try to avoid being around people who are sick. This medicine may increase your risk to bruise or bleed. Call your doctor or health care professional if you notice any unusual bleeding. Be careful brushing and flossing your teeth or using a toothpick because you may get an infection or bleed more easily. If you have any dental work done, tell your dentist you are receiving this medicine. Avoid taking products that contain aspirin, acetaminophen, ibuprofen, naproxen, or ketoprofen unless instructed by your doctor. These medicines may hide a fever. Do not become pregnant while taking this medicine. Women should inform their doctor if they wish to become pregnant or think they might be pregnant. There is a potential for serious side effects to an unborn child. Talk to your health care professional or  pharmacist for more information. Do not breast-feed an infant while taking this medicine. What side effects may I notice from receiving this medicine? Side effects  that you should report to your doctor or health care professional as soon as possible: -allergic reactions like skin rash, itching or hives, swelling of the face, lips, or tongue -signs of infection - fever or chills, cough, sore throat, pain or difficulty passing urine -signs of decreased platelets or bleeding - bruising, pinpoint red spots on the skin, black, tarry stools, nosebleeds -signs of decreased red blood cells - unusually weak or tired, fainting spells, lightheadedness -breathing problems -changes in hearing -changes in vision -chest pain -high blood pressure -low blood counts - This drug may decrease the number of white blood cells, red blood cells and platelets. You may be at increased risk for infections and bleeding. -nausea and vomiting -pain, swelling, redness or irritation at the injection site -pain, tingling, numbness in the hands or feet -problems with balance, talking, walking -trouble passing urine or change in the amount of urine Side effects that usually do not require medical attention (report to your doctor or health care professional if they continue or are bothersome): -hair loss -loss of appetite -metallic taste in the mouth or changes in taste This list may not describe all possible side effects. Call your doctor for medical advice about side effects. You may report side effects to FDA at 1-800-FDA-1088. Where should I keep my medicine? This drug is given in a hospital or clinic and will not be stored at home. NOTE: This sheet is a summary. It may not cover all possible information. If you have questions about this medicine, talk to your doctor, pharmacist, or health care provider.  2018 Elsevier/Gold Standard (2007-11-08 14:38:05)  

## 2017-05-29 DIAGNOSIS — Z7189 Other specified counseling: Secondary | ICD-10-CM | POA: Insufficient documentation

## 2017-05-29 MED ORDER — ONDANSETRON HCL 8 MG PO TABS: 8.0000 mg | ORAL_TABLET | Freq: Two times a day (BID) | ORAL | 2 refills | Status: DC | PRN

## 2017-05-29 MED ORDER — PROCHLORPERAZINE MALEATE 10 MG PO TABS: 10.0000 mg | ORAL_TABLET | Freq: Four times a day (QID) | ORAL | 2 refills | Status: DC | PRN

## 2017-05-29 NOTE — Progress Notes (Signed)
START OFF PATHWAY REGIMEN - Ovarian   [id number]:Carboplatin AUC=5 q21 Days:   A cycle is every 21 days:     Carboplatin   **Always confirm dose/schedule in your pharmacy ordering system**    Patient Characteristics: Newly Diagnosed, Neoadjuvant Therapy AJCC T Category: T3c AJCC N Category: N1b AJCC M Category: M0 Therapeutic Status: Newly Diagnosed, Neoadjuvant Therapy AJCC 8 Stage Grouping: IIIC Intent of Therapy: Non-Curative / Palliative Intent, Discussed with Patient

## 2017-06-01 ENCOUNTER — Inpatient Hospital Stay: Payer: Medicare Other

## 2017-06-01 NOTE — Progress Notes (Signed)
[place]  [phone] [phone]  ID: Tamara Mckinney OB: 1927/11/01  MR#: [id number]  QIO#:[id number]  Patient Care Team: [name], MD as PCP - General (Family Medicine) [name], RN as Registered Nurse  CHIEF COMPLAINT: Stage IIIC ovarian cancer.  INTERVAL HISTORY: Patient returns to clinic today for further evaluation and initiation of cycle 1 of single agent carboplatinum. She currently feels well. She continues to have mild abdominal bloating, but denies any pain. She has a fair appetite. She has no neurologic complaints. She denies any fevers. She denies any chest pain or shortness of breath. She has no nausea, vomiting, constipation, or diarrhea. She does report some mild heartburn. She has no urinary complaints. Patient offers no further specific complaints.  REVIEW OF SYSTEMS:   Review of Systems  Constitutional: Positive for malaise/fatigue. Negative for fever and weight loss.  Respiratory: Negative.  Negative for cough and shortness of breath.   Cardiovascular: Negative.  Negative for chest pain and leg swelling.  Gastrointestinal: Negative.  Negative for abdominal pain, blood in stool, constipation, diarrhea, melena, nausea and vomiting.  Genitourinary: Negative.   Musculoskeletal: Negative.   Skin: Negative.  Negative for rash.  Neurological: Positive for weakness.  Endo/Heme/Allergies: Does not bruise/bleed easily.  Psychiatric/Behavioral: Negative.  The patient is not nervous/anxious.     As per HPI. Otherwise, a complete review of systems is negative.  PAST MEDICAL HISTORY: Past Medical History:  Diagnosis Date  . Anemia   . Arthritis   . Asthma   . Cancer ([place])   . Dyspnea   . GERD (gastroesophageal reflux disease)   . Hypertension   . Sjogren's disease ([place])     PAST SURGICAL HISTORY: Past Surgical History:  Procedure Laterality Date  . ABDOMINAL HYSTERECTOMY    . BREAST BIOPSY     x6  .  BUNIONECTOMY Bilateral   . CATARACT EXTRACTION, BILATERAL    . FLEXIBLE SIGMOIDOSCOPY N/A 05/21/2017   Procedure: FLEXIBLE SIGMOIDOSCOPY;  Surgeon: Lin Landsman, MD;  Location: [facility] ENDOSCOPY;  Service: Gastroenterology;  Laterality: N/A;  . INCONTINENCE SURGERY    . REPAIR OF RECTAL PROLAPSE N/A 05/11/2017   Procedure: REPAIR OF RECTAL PROLAPSE;  Surgeon: Leonie Green, MD;  Location: [facility] ORS;  Service: General;  Laterality: N/A;  . TONSILLECTOMY    . VAGINA SURGERY     uncertain procedure performed    FAMILY HISTORY: Family History  Problem Relation Age of Onset  . Heart disease Mother   . Heart disease Father   . Heart disease Sister   . Heart attack Sister   . Ulcerative colitis Brother   . Lung cancer Brother   . Thyroid cancer Sister   . Asthma Sister   . Diabetes Sister   . Asthma Sister   . Pancreatic cancer Sister   . Dementia Sister   . Asthma Brother   . Heart disease Brother   . Asthma Brother   . Lung cancer Brother   . Asthma Brother   . Lung cancer Brother   . Lung cancer Brother   . Rheum arthritis Brother     ADVANCED DIRECTIVES (Y/N):  N  HEALTH MAINTENANCE: Social History  Substance Use Topics  . Smoking status: Never Smoker  . Smokeless tobacco: Never Used  . Alcohol use No     Colonoscopy:  PAP:  Bone density:  Lipid panel:  No Known Allergies  Current Outpatient Prescriptions  Medication Sig Dispense Refill  . acetaminophen (TYLENOL)  500 MG tablet Take 500 mg by mouth 2 (two) times daily as needed for moderate pain.     Marland Kitchen albuterol (PROVENTIL HFA;VENTOLIN HFA) 108 (90 Base) MCG/ACT inhaler Inhale 1-2 puffs into the lungs every 6 (six) hours as needed for wheezing or shortness of breath.    Marland Kitchen amLODipine (NORVASC) 5 MG tablet Take 5 mg by mouth daily with breakfast.    . Ascorbic Acid (VITAMIN C) 1000 MG tablet Take 1,000 mg by mouth daily.    . Calcium Carb-Cholecalciferol (CALTRATE 600+D) 600-800 MG-UNIT TABS Take 1 tablet  by mouth daily.    . Carboxymeth-Glyc-Polysorb PF (REFRESH OPTIVE ADVANCED PF) 0.5-1-0.5 % SOLN Place 1-2 drops into both eyes 3 (three) times daily as needed. For dry, irritated eyes    . Carboxymethylcellulose Sod PF (REFRESH CELLUVISC) 1 % GEL Place 1 drop into both eyes at bedtime. In the middle of the night     . cetirizine (ZYRTEC) 10 MG tablet Take 10 mg by mouth at bedtime as needed for allergies.    . Cholecalciferol (VITAMIN D3) 2000 units capsule Take 2,000 Units by mouth daily.    . Cyanocobalamin (RA VITAMIN B12) 2000 MCG TBCR Take 2,000 mcg by mouth daily.    . cyclobenzaprine (FLEXERIL) 10 MG tablet Take 10 mg by mouth at bedtime.    . DHA-EPA-Flaxseed Oil-Vitamin E (THERA TEARS NUTRITION PO) Take 2 tablets by mouth daily.    Marland Kitchen docusate sodium (COLACE) 100 MG capsule Take 100-300 mg by mouth at bedtime as needed for mild constipation.    Marland Kitchen esomeprazole (NEXIUM) 40 MG capsule Take 40 mg by mouth daily.     . fluticasone furoate-vilanterol (BREO ELLIPTA) 100-25 MCG/INH AEPB Inhale 1 puff into the lungs at bedtime.    . furosemide (LASIX) 40 MG tablet Take 40 mg by mouth daily with breakfast.     . Glucosamine-MSM-Hyaluronic Acd (JOINT HEALTH PO) Take 1 tablet by mouth daily.    . hypromellose (SYSTANE OVERNIGHT THERAPY) 0.3 % GEL ophthalmic ointment Place 1 application into both eyes at bedtime.    Marland Kitchen ipratropium-albuterol (DUONEB) 0.5-2.5 (3) MG/3ML SOLN Take 3 mLs by nebulization every 6 (six) hours as needed. For wheezing/shortness of breath    . losartan (COZAAR) 100 MG tablet Take 100 mg by mouth at bedtime.    . meclizine (ANTIVERT) 25 MG tablet Take 25 mg by mouth 3 (three) times daily as needed for dizziness.     . meloxicam (MOBIC) 15 MG tablet Take 15 mg by mouth daily with breakfast.    . montelukast (SINGULAIR) 10 MG tablet Take 10 mg by mouth at bedtime.    . ondansetron (ZOFRAN) 8 MG tablet Take 1 tablet (8 mg total) by mouth 2 (two) times daily as needed for refractory  nausea / vomiting. 60 tablet 2  . prochlorperazine (COMPAZINE) 10 MG tablet Take 1 tablet (10 mg total) by mouth every 6 (six) hours as needed (Nausea or vomiting). 60 tablet 2  . triamcinolone cream (KENALOG) 0.5 % Apply 1 application topically as needed. For inflammation     No current facility-administered medications for this visit.     OBJECTIVE: Vitals:   06/03/17 1003  BP: 132/83  Pulse: (!) 104  Resp: 20  Temp: (!) 97.5 F (36.4 C)     Body mass index is 20.84 kg/m.    ECOG FS:1 - Symptomatic but completely ambulatory  General: Well-developed, well-nourished, no acute distress. Eyes: Pink conjunctiva, anicteric sclera. Lungs: Clear to auscultation bilaterally.  Heart: Regular rate and rhythm. No rubs, murmurs, or gallops. Abdomen: Soft, mildly distended. No organomegaly noted, normoactive bowel sounds. Musculoskeletal: No edema, cyanosis, or clubbing. Neuro: Alert, answering all questions appropriately. Cranial nerves grossly intact. Skin: No rashes or petechiae noted. Psych: Normal affect.     LAB RESULTS:  Lab Results  Component Value Date   NA 135 06/03/2017   K 3.9 06/03/2017   CL 102 06/03/2017   CO2 24 06/03/2017   GLUCOSE 95 06/03/2017   BUN 50 (H) 06/03/2017   CREATININE 1.18 (H) 06/03/2017   CALCIUM 8.6 (L) 06/03/2017   PROT 6.3 (L) 05/19/2017   ALBUMIN 2.6 (L) 05/19/2017   AST 29 05/19/2017   ALT 13 (L) 05/19/2017   ALKPHOS 77 05/19/2017   BILITOT 0.5 05/19/2017   GFRNONAA 40 (L) 06/03/2017   GFRAA 47 (L) 06/03/2017    Lab Results  Component Value Date   WBC 11.3 (H) 06/03/2017   NEUTROABS 10.1 (H) 06/03/2017   HGB 8.9 (L) 06/03/2017   HCT 27.6 (L) 06/03/2017   MCV 91.6 06/03/2017   PLT 626 (H) 06/03/2017     STUDIES: Ct Abdomen Pelvis W Contrast  Result Date: 05/14/2017 CLINICAL DATA:  Abdominal mass. EXAM: CT ABDOMEN AND PELVIS WITH CONTRAST TECHNIQUE: Multidetector CT imaging of the abdomen and pelvis was performed using the  standard protocol following bolus administration of intravenous contrast. CONTRAST:  66mL ISOVUE-300 IOPAMIDOL (ISOVUE-300) INJECTION 61% COMPARISON:  None. FINDINGS: Lower chest: Small bilateral pleural effusions. Moderate hiatal hernia with about 50% of the stomach in the chest. 2.1 x 3.7 cm abnormal soft tissue mass is identified in the hiatal hernia (image 7 series 2). Hepatobiliary: Multiple soft tissue masses are seen along the liver capsule and in the right infra diaphragmatic space. Index lesion along the posteromedial right lobe measures 5.8 x 2.0 cm on image 24 series 2. There is no evidence for gallstones, gallbladder wall thickening, or pericholecystic fluid. No intrahepatic or extrahepatic biliary dilation. Pancreas: No focal mass lesion. No dilatation of the main duct. No intraparenchymal cyst. No peripancreatic edema. Spleen: Calcified granuloma noted in the spleen. Adrenals/Urinary Tract: No adrenal nodule or mass. Kidneys unremarkable. No evidence for hydroureter. Bladder is moderately distended. Stomach/Bowel: Hiatal hernia as described above. Large duodenal diverticulum evident. No evidence for small bowel obstruction. Terminal ileum not well seen, but the ileocecal valve and terminal ileum visible on image 21 of series 8. Appendix is visualized on image 25 of coronal series 8 and is unremarkable. The colon is diffusely distended with stool although left colon is decompressed. Suture line noted in the region of the anal rectal junction. Vascular/Lymphatic: There is abdominal aortic atherosclerosis without aneurysm. See "Other" section in report below. Reproductive: Uterus appears to be surgically absent. 19 mm cystic lesion identified in the left adnexal space. No right adnexal mass. Other: Small to moderate volume ascites identified. Peritoneal, omental, and mesenteric nodularity is compatible with metastatic disease. 4.1 x 8.2 cm left omental cake is identified on image 40 of series 2. 7.4 x 2.8  cm omental cake is identified in the splenic flexure (image 19 series 2). Numerous other areas of omental and peritoneal nodularity are evident. Loculated fluid with peritoneal nodularity is identified in the cul-de-sac. 2.4 x 3.3 cm soft tissue nodule appears to invade the posterior right bladder wall (image 61 series 2). 4.7 x 1.7 cm nodal conglomeration or abnormal soft tissue mass identified in the porta hepatis. This abnormal soft tissue tracks cranially up along the IVC  and liver capsule, generating marked mass-effect on the intrahepatic segment of the IVC. Musculoskeletal: Convex rightward thoracolumbar scoliosis is associated with compensatory rightward scoliosis of the lower lumbar spine. IMPRESSION: 1. Small to moderate volume ascites with bulky omental, peritoneal, and mesenteric soft tissue compatible with metastatic disease. There are multiple peritoneal nodules with fluid in the cul-de-sac. One of these nodules appears to involve the posterior right bladder wall. Primary source of neoplasm not evident on this exam. 2. Bulky metastatic disease in the porta hepatis tracking up along the medial liver capsule generates substantial mass-effect on the intrahepatic IVC. 3. Right and transverse segments of the colon are distended up to 6 cm diameter in stool-filled. Left colon is decompressed. These changes may be related to constipation, but colonic obstruction not entirely excluded. There is no associated small bowel dilatation. 4. Diffuse body wall edema. 5.  Aortic Atherosclerois (ICD10-170.0) Electronically Signed   By: Misty Stanley M.D.   On: 05/14/2017 16:04   Dg Abd 2 Views  Result Date: 05/19/2017 CLINICAL DATA:  History of surgery for rectal prolapse, near syncopal episode EXAM: ABDOMEN - 2 VIEW COMPARISON:  None. FINDINGS: Supine and upright views of the abdomen. Moderate hiatal hernia. No free air beneath the diaphragm. Nonobstructed gas pattern with large amount of stool in the colon.  Surgical sutures in the region of the rectum. Calcified pelvic phleboliths. Scoliosis of the spine. IMPRESSION: 1. Nonobstructed bowel-gas pattern with large amount of stool in the colon. Negative for free air. 2. Moderate hiatal hernia. Electronically Signed   By: Donavan Foil M.D.   On: 05/19/2017 15:07    ASSESSMENT: Stage IIIC ovarian cancer.  PLAN:    1. Stage IIIC ovarian cancer: Given patient's advanced age she is not a surgical candidate. CT scan results reviewed independently confirming stage of disease. After lengthy discussion with the patient, she wishes to try palliative chemotherapy using single agent carboplatinum. Although, combination therapy is recommended, given patient's age and performance status, the toxicity was thought to be too high. Will not get a port at this time, but patient may consider one in the future. Proceed with cycle 1 of single agent carboplatinum. Return to clinic in 1 week for laboratory work and further evaluation and then in 3 weeks for consideration of cycle 2. Plan to reimage at the conclusion of cycle 4. 2. Lupus anticoagulant: Patient was noted to have an elevated PTT as well as increased bleeding during her surgery for rectal prolapse. Continue to monitor closely and repeat lupus anticoagulant panel in 10-12 weeks. 3. Anemia: Patient's hemoglobin has trended down slightly. Proceed with 510 mg IV Feraheme today. Patient will receive a second infusion next week.    Approximately 30 minutes was spent in discussion of which greater than 50% was consultation.  Patient expressed understanding and was in agreement with this plan. She also understands that She can call clinic at any time with any questions, concerns, or complaints.   Cancer Staging Malignant neoplasm of ovary [facility]) Staging form: Ovary, Fallopian Tube, and Primary Peritoneal Carcinoma, AJCC 8th Edition - Clinical stage from 05/29/2017: Stage IIIC (cT3c, cN1b, cM0) - Signed by Lloyd Huger, MD on 05/29/2017   Lloyd Huger, MD   06/03/2017 10:11 AM

## 2017-06-03 ENCOUNTER — Inpatient Hospital Stay (HOSPITAL_BASED_OUTPATIENT_CLINIC_OR_DEPARTMENT_OTHER): Payer: Medicare Other | Admitting: Oncology

## 2017-06-03 ENCOUNTER — Inpatient Hospital Stay: Payer: Medicare Other

## 2017-06-03 ENCOUNTER — Ambulatory Visit: Payer: Medicare Other

## 2017-06-03 VITALS — BP 128/78 | HR 106 | Resp 20

## 2017-06-03 VITALS — BP 132/83 | HR 104 | Temp 97.5°F | Resp 20 | Wt 96.3 lb

## 2017-06-03 DIAGNOSIS — R5381 Other malaise: Secondary | ICD-10-CM | POA: Diagnosis not present

## 2017-06-03 DIAGNOSIS — R531 Weakness: Secondary | ICD-10-CM | POA: Diagnosis not present

## 2017-06-03 DIAGNOSIS — R791 Abnormal coagulation profile: Secondary | ICD-10-CM | POA: Diagnosis not present

## 2017-06-03 DIAGNOSIS — R5383 Other fatigue: Secondary | ICD-10-CM | POA: Diagnosis not present

## 2017-06-03 DIAGNOSIS — C569 Malignant neoplasm of unspecified ovary: Secondary | ICD-10-CM

## 2017-06-03 DIAGNOSIS — R188 Other ascites: Secondary | ICD-10-CM | POA: Diagnosis not present

## 2017-06-03 DIAGNOSIS — Z79899 Other long term (current) drug therapy: Secondary | ICD-10-CM

## 2017-06-03 DIAGNOSIS — D6862 Lupus anticoagulant syndrome: Secondary | ICD-10-CM | POA: Diagnosis not present

## 2017-06-03 DIAGNOSIS — M1990 Unspecified osteoarthritis, unspecified site: Secondary | ICD-10-CM

## 2017-06-03 DIAGNOSIS — K623 Rectal prolapse: Secondary | ICD-10-CM | POA: Diagnosis not present

## 2017-06-03 DIAGNOSIS — C786 Secondary malignant neoplasm of retroperitoneum and peritoneum: Secondary | ICD-10-CM | POA: Diagnosis not present

## 2017-06-03 DIAGNOSIS — I10 Essential (primary) hypertension: Secondary | ICD-10-CM

## 2017-06-03 DIAGNOSIS — R160 Hepatomegaly, not elsewhere classified: Secondary | ICD-10-CM

## 2017-06-03 DIAGNOSIS — K219 Gastro-esophageal reflux disease without esophagitis: Secondary | ICD-10-CM

## 2017-06-03 DIAGNOSIS — K449 Diaphragmatic hernia without obstruction or gangrene: Secondary | ICD-10-CM

## 2017-06-03 DIAGNOSIS — M3500 Sicca syndrome, unspecified: Secondary | ICD-10-CM

## 2017-06-03 DIAGNOSIS — Z9889 Other specified postprocedural states: Secondary | ICD-10-CM | POA: Diagnosis not present

## 2017-06-03 DIAGNOSIS — Z800 Family history of malignant neoplasm of digestive organs: Secondary | ICD-10-CM

## 2017-06-03 DIAGNOSIS — M419 Scoliosis, unspecified: Secondary | ICD-10-CM

## 2017-06-03 DIAGNOSIS — D500 Iron deficiency anemia secondary to blood loss (chronic): Secondary | ICD-10-CM

## 2017-06-03 DIAGNOSIS — Z801 Family history of malignant neoplasm of trachea, bronchus and lung: Secondary | ICD-10-CM

## 2017-06-03 DIAGNOSIS — D509 Iron deficiency anemia, unspecified: Secondary | ICD-10-CM

## 2017-06-03 LAB — CBC WITH DIFFERENTIAL/PLATELET
Basophils Absolute: 0.1 10*3/uL (ref 0–0.1)
Basophils Relative: 1 %
Eosinophils Absolute: 0 10*3/uL (ref 0–0.7)
Eosinophils Relative: 0 %
HCT: 27.6 % — ABNORMAL LOW (ref 35.0–47.0)
Hemoglobin: 8.9 g/dL — ABNORMAL LOW (ref 12.0–16.0)
Lymphocytes Relative: 5 %
Lymphs Abs: 0.6 10*3/uL — ABNORMAL LOW (ref 1.0–3.6)
MCH: 29.7 pg (ref 26.0–34.0)
MCHC: 32.4 g/dL (ref 32.0–36.0)
MCV: 91.6 fL (ref 80.0–100.0)
Monocytes Absolute: 0.5 10*3/uL (ref 0.2–0.9)
Monocytes Relative: 5 %
Neutro Abs: 10.1 10*3/uL — ABNORMAL HIGH (ref 1.4–6.5)
Neutrophils Relative %: 89 %
Platelets: 626 10*3/uL — ABNORMAL HIGH (ref 150–440)
RBC: 3.01 MIL/uL — ABNORMAL LOW (ref 3.80–5.20)
RDW: 15.4 % — ABNORMAL HIGH (ref 11.5–14.5)
WBC: 11.3 10*3/uL — ABNORMAL HIGH (ref 3.6–11.0)

## 2017-06-03 LAB — BASIC METABOLIC PANEL
Anion gap: 9 (ref 5–15)
BUN: 50 mg/dL — ABNORMAL HIGH (ref 6–20)
CO2: 24 mmol/L (ref 22–32)
Calcium: 8.6 mg/dL — ABNORMAL LOW (ref 8.9–10.3)
Chloride: 102 mmol/L (ref 101–111)
Creatinine, Ser: 1.18 mg/dL — ABNORMAL HIGH (ref 0.44–1.00)
GFR calc Af Amer: 47 mL/min — ABNORMAL LOW (ref >60)
GFR calc non Af Amer: 40 mL/min — ABNORMAL LOW (ref >60)
Glucose, Bld: 95 mg/dL (ref 65–99)
Potassium: 3.9 mmol/L (ref 3.5–5.1)
Sodium: 135 mmol/L (ref 135–145)

## 2017-06-03 MED ORDER — SODIUM CHLORIDE 0.9 % IV SOLN
243.0000 mg | Freq: Once | INTRAVENOUS | Status: AC
  Administered 2017-06-03: 240 mg via INTRAVENOUS
  Filled 2017-06-03: qty 24

## 2017-06-03 MED ORDER — SODIUM CHLORIDE 0.9 % IV SOLN
Freq: Once | INTRAVENOUS | Status: AC
  Administered 2017-06-03: 11:00:00 via INTRAVENOUS
  Filled 2017-06-03: qty 5

## 2017-06-03 MED ORDER — SODIUM CHLORIDE 0.9 % IV SOLN
510.0000 mg | Freq: Once | INTRAVENOUS | Status: AC
  Administered 2017-06-03: 510 mg via INTRAVENOUS
  Filled 2017-06-03: qty 17

## 2017-06-03 MED ORDER — SODIUM CHLORIDE 0.9 % IV SOLN
Freq: Once | INTRAVENOUS | Status: AC
  Administered 2017-06-03: 11:00:00 via INTRAVENOUS
  Filled 2017-06-03: qty 1000

## 2017-06-03 MED ORDER — PALONOSETRON HCL INJECTION 0.25 MG/5ML
0.2500 mg | Freq: Once | INTRAVENOUS | Status: AC
  Administered 2017-06-03: 0.25 mg via INTRAVENOUS
  Filled 2017-06-03: qty 5

## 2017-06-03 NOTE — Progress Notes (Signed)
Pt in today for first chemo treatment.  Went to chemo class.  Sons are accompanying pt.  Reviewed use of zofran and compazine with pt and caregivers.

## 2017-06-04 LAB — CA 125: Cancer Antigen (CA) 125: 406.6 U/mL — ABNORMAL HIGH (ref 0.0–38.1)

## 2017-06-08 NOTE — Progress Notes (Signed)
[place]  [phone] [phone]) [phone]  ID: Tamara Mckinney OB: August 10, 1928  MR#: [id number]  DGU#:[id number]  Patient Care Team: [name], MD as PCP - General (Family Medicine) [name], RN as Registered Nurse  CHIEF COMPLAINT: Stage IIIC ovarian cancer.  INTERVAL HISTORY: Patient returns to clinic today for further evaluation and to assess her toleration of cycle 1 of single agent carboplatinum. She tolerated her treatment well with only some mild nausea and decreased appetite. He also had some mild dizziness, but no other neurologic complaints. Patient feels her abdominal bloating improved after just one cycle. She continues to deny pain. She denies any fevers. She denies any chest pain or shortness of breath. She has no vomiting, constipation, or diarrhea. She has no urinary complaints. Patient offers no further specific complaints.  REVIEW OF SYSTEMS:   Review of Systems  Constitutional: Positive for malaise/fatigue. Negative for fever and weight loss.  Respiratory: Negative.  Negative for cough and shortness of breath.   Cardiovascular: Negative.  Negative for chest pain and leg swelling.  Gastrointestinal: Positive for heartburn and nausea. Negative for abdominal pain, blood in stool, constipation, diarrhea, melena and vomiting.  Genitourinary: Negative.   Musculoskeletal: Negative.   Skin: Negative.  Negative for rash.  Neurological: Positive for weakness.  Endo/Heme/Allergies: Does not bruise/bleed easily.  Psychiatric/Behavioral: Negative.  The patient is not nervous/anxious.     As per HPI. Otherwise, a complete review of systems is negative.  PAST MEDICAL HISTORY: Past Medical History:  Diagnosis Date  . Anemia   . Arthritis   . Asthma   . Cancer ([place])   . Dyspnea   . GERD (gastroesophageal reflux disease)   . Hypertension   . Sjogren's disease ([place])     PAST SURGICAL HISTORY: Past Surgical History:  Procedure  Laterality Date  . ABDOMINAL HYSTERECTOMY    . BREAST BIOPSY     x6  . BUNIONECTOMY Bilateral   . CATARACT EXTRACTION, BILATERAL    . FLEXIBLE SIGMOIDOSCOPY N/A 05/21/2017   Procedure: FLEXIBLE SIGMOIDOSCOPY;  Surgeon: Lin Landsman, MD;  Location: [facility] ENDOSCOPY;  Service: Gastroenterology;  Laterality: N/A;  . INCONTINENCE SURGERY    . REPAIR OF RECTAL PROLAPSE N/A 05/11/2017   Procedure: REPAIR OF RECTAL PROLAPSE;  Surgeon: Leonie Green, MD;  Location: [facility] ORS;  Service: General;  Laterality: N/A;  . TONSILLECTOMY    . VAGINA SURGERY     uncertain procedure performed    FAMILY HISTORY: Family History  Problem Relation Age of Onset  . Heart disease Mother   . Heart disease Father   . Heart disease Sister   . Heart attack Sister   . Ulcerative colitis Brother   . Lung cancer Brother   . Thyroid cancer Sister   . Asthma Sister   . Diabetes Sister   . Asthma Sister   . Pancreatic cancer Sister   . Dementia Sister   . Asthma Brother   . Heart disease Brother   . Asthma Brother   . Lung cancer Brother   . Asthma Brother   . Lung cancer Brother   . Lung cancer Brother   . Rheum arthritis Brother     ADVANCED DIRECTIVES (Y/N):  N  HEALTH MAINTENANCE: Social History  Substance Use Topics  . Smoking status: Never Smoker  . Smokeless tobacco: Never Used  . Alcohol use No     Colonoscopy:  PAP:  Bone density:  Lipid panel:  No Known Allergies  Current Outpatient Prescriptions  Medication Sig Dispense Refill  . acetaminophen (TYLENOL) 500 MG tablet Take 500 mg by mouth 2 (two) times daily as needed for moderate pain.     Marland Kitchen albuterol (PROVENTIL HFA;VENTOLIN HFA) 108 (90 Base) MCG/ACT inhaler Inhale 1-2 puffs into the lungs every 6 (six) hours as needed for wheezing or shortness of breath.    Marland Kitchen amLODipine (NORVASC) 5 MG tablet Take 5 mg by mouth daily with breakfast.    . Ascorbic Acid (VITAMIN C) 1000 MG tablet Take 1,000 mg by mouth daily.    .  Calcium Carb-Cholecalciferol (CALTRATE 600+D) 600-800 MG-UNIT TABS Take 1 tablet by mouth daily.    . Carboxymeth-Glyc-Polysorb PF (REFRESH OPTIVE ADVANCED PF) 0.5-1-0.5 % SOLN Place 1-2 drops into both eyes 3 (three) times daily as needed. For dry, irritated eyes    . Carboxymethylcellulose Sod PF (REFRESH CELLUVISC) 1 % GEL Place 1 drop into both eyes at bedtime. In the middle of the night     . cetirizine (ZYRTEC) 10 MG tablet Take 10 mg by mouth at bedtime as needed for allergies.    . Cholecalciferol (VITAMIN D3) 2000 units capsule Take 2,000 Units by mouth daily.    . Cyanocobalamin (RA VITAMIN B12) 2000 MCG TBCR Take 2,000 mcg by mouth daily.    . cyclobenzaprine (FLEXERIL) 10 MG tablet Take 1 tablet (10 mg total) by mouth at bedtime. 30 tablet 2  . DHA-EPA-Flaxseed Oil-Vitamin E (THERA TEARS NUTRITION PO) Take 2 tablets by mouth daily.    Marland Kitchen docusate sodium (COLACE) 100 MG capsule Take 100-300 mg by mouth at bedtime as needed for mild constipation.    Marland Kitchen esomeprazole (NEXIUM) 40 MG capsule Take 40 mg by mouth daily.     . fluticasone furoate-vilanterol (BREO ELLIPTA) 100-25 MCG/INH AEPB Inhale 1 puff into the lungs at bedtime.    . furosemide (LASIX) 40 MG tablet Take 40 mg by mouth daily with breakfast.     . Glucosamine-MSM-Hyaluronic Acd (JOINT HEALTH PO) Take 1 tablet by mouth daily.    . hypromellose (SYSTANE OVERNIGHT THERAPY) 0.3 % GEL ophthalmic ointment Place 1 application into both eyes at bedtime.    Marland Kitchen ipratropium-albuterol (DUONEB) 0.5-2.5 (3) MG/3ML SOLN Take 3 mLs by nebulization every 6 (six) hours as needed. For wheezing/shortness of breath    . losartan (COZAAR) 100 MG tablet Take 100 mg by mouth at bedtime.    . meclizine (ANTIVERT) 25 MG tablet Take 25 mg by mouth 3 (three) times daily as needed for dizziness.     . meloxicam (MOBIC) 15 MG tablet Take 15 mg by mouth daily with breakfast.    . montelukast (SINGULAIR) 10 MG tablet Take 10 mg by mouth at bedtime.    .  ondansetron (ZOFRAN) 8 MG tablet Take 1 tablet (8 mg total) by mouth 2 (two) times daily as needed for refractory nausea / vomiting. 60 tablet 2  . prochlorperazine (COMPAZINE) 10 MG tablet Take 1 tablet (10 mg total) by mouth every 6 (six) hours as needed (Nausea or vomiting). 60 tablet 2  . triamcinolone cream (KENALOG) 0.5 % Apply 1 application topically as needed. For inflammation     No current facility-administered medications for this visit.     OBJECTIVE: Vitals:   06/09/17 1408  BP: 139/82  Pulse: 98  Resp: 20  Temp: 97.7 F (36.5 C)     Body mass index is 19.67 kg/m.    ECOG FS:1 - Symptomatic but completely ambulatory  General: Well-developed,  well-nourished, no acute distress. Eyes: Pink conjunctiva, anicteric sclera. Lungs: Clear to auscultation bilaterally. Heart: Regular rate and rhythm. No rubs, murmurs, or gallops. Abdomen: Soft, mildly distended. No organomegaly noted, normoactive bowel sounds. Musculoskeletal: No edema, cyanosis, or clubbing. Neuro: Alert, answering all questions appropriately. Cranial nerves grossly intact. Skin: No rashes or petechiae noted. Psych: Normal affect.     LAB RESULTS:  Lab Results  Component Value Date   NA 137 06/09/2017   K 4.3 06/09/2017   CL 105 06/09/2017   CO2 21 (L) 06/09/2017   GLUCOSE 130 (H) 06/09/2017   BUN 39 (H) 06/09/2017   CREATININE 0.97 06/09/2017   CALCIUM 8.4 (L) 06/09/2017   PROT 6.3 (L) 05/19/2017   ALBUMIN 2.6 (L) 05/19/2017   AST 29 05/19/2017   ALT 13 (L) 05/19/2017   ALKPHOS 77 05/19/2017   BILITOT 0.5 05/19/2017   GFRNONAA 51 (L) 06/09/2017   GFRAA 59 (L) 06/09/2017    Lab Results  Component Value Date   WBC 5.7 06/09/2017   NEUTROABS 4.4 06/09/2017   HGB 8.3 (L) 06/09/2017   HCT 25.7 (L) 06/09/2017   MCV 92.4 06/09/2017   PLT 513 (H) 06/09/2017     STUDIES: Ct Abdomen Pelvis W Contrast  Result Date: 05/14/2017 CLINICAL DATA:  Abdominal mass. EXAM: CT ABDOMEN AND PELVIS WITH  CONTRAST TECHNIQUE: Multidetector CT imaging of the abdomen and pelvis was performed using the standard protocol following bolus administration of intravenous contrast. CONTRAST:  94mL ISOVUE-300 IOPAMIDOL (ISOVUE-300) INJECTION 61% COMPARISON:  None. FINDINGS: Lower chest: Small bilateral pleural effusions. Moderate hiatal hernia with about 50% of the stomach in the chest. 2.1 x 3.7 cm abnormal soft tissue mass is identified in the hiatal hernia (image 7 series 2). Hepatobiliary: Multiple soft tissue masses are seen along the liver capsule and in the right infra diaphragmatic space. Index lesion along the posteromedial right lobe measures 5.8 x 2.0 cm on image 24 series 2. There is no evidence for gallstones, gallbladder wall thickening, or pericholecystic fluid. No intrahepatic or extrahepatic biliary dilation. Pancreas: No focal mass lesion. No dilatation of the main duct. No intraparenchymal cyst. No peripancreatic edema. Spleen: Calcified granuloma noted in the spleen. Adrenals/Urinary Tract: No adrenal nodule or mass. Kidneys unremarkable. No evidence for hydroureter. Bladder is moderately distended. Stomach/Bowel: Hiatal hernia as described above. Large duodenal diverticulum evident. No evidence for small bowel obstruction. Terminal ileum not well seen, but the ileocecal valve and terminal ileum visible on image 21 of series 8. Appendix is visualized on image 25 of coronal series 8 and is unremarkable. The colon is diffusely distended with stool although left colon is decompressed. Suture line noted in the region of the anal rectal junction. Vascular/Lymphatic: There is abdominal aortic atherosclerosis without aneurysm. See "Other" section in report below. Reproductive: Uterus appears to be surgically absent. 19 mm cystic lesion identified in the left adnexal space. No right adnexal mass. Other: Small to moderate volume ascites identified. Peritoneal, omental, and mesenteric nodularity is compatible with  metastatic disease. 4.1 x 8.2 cm left omental cake is identified on image 40 of series 2. 7.4 x 2.8 cm omental cake is identified in the splenic flexure (image 19 series 2). Numerous other areas of omental and peritoneal nodularity are evident. Loculated fluid with peritoneal nodularity is identified in the cul-de-sac. 2.4 x 3.3 cm soft tissue nodule appears to invade the posterior right bladder wall (image 61 series 2). 4.7 x 1.7 cm nodal conglomeration or abnormal soft tissue mass identified in  the porta hepatis. This abnormal soft tissue tracks cranially up along the IVC and liver capsule, generating marked mass-effect on the intrahepatic segment of the IVC. Musculoskeletal: Convex rightward thoracolumbar scoliosis is associated with compensatory rightward scoliosis of the lower lumbar spine. IMPRESSION: 1. Small to moderate volume ascites with bulky omental, peritoneal, and mesenteric soft tissue compatible with metastatic disease. There are multiple peritoneal nodules with fluid in the cul-de-sac. One of these nodules appears to involve the posterior right bladder wall. Primary source of neoplasm not evident on this exam. 2. Bulky metastatic disease in the porta hepatis tracking up along the medial liver capsule generates substantial mass-effect on the intrahepatic IVC. 3. Right and transverse segments of the colon are distended up to 6 cm diameter in stool-filled. Left colon is decompressed. These changes may be related to constipation, but colonic obstruction not entirely excluded. There is no associated small bowel dilatation. 4. Diffuse body wall edema. 5.  Aortic Atherosclerois (ICD10-170.0) Electronically Signed   By: Misty Stanley M.D.   On: 05/14/2017 16:04   Dg Abd 2 Views  Result Date: 05/19/2017 CLINICAL DATA:  History of surgery for rectal prolapse, near syncopal episode EXAM: ABDOMEN - 2 VIEW COMPARISON:  None. FINDINGS: Supine and upright views of the abdomen. Moderate hiatal hernia. No free  air beneath the diaphragm. Nonobstructed gas pattern with large amount of stool in the colon. Surgical sutures in the region of the rectum. Calcified pelvic phleboliths. Scoliosis of the spine. IMPRESSION: 1. Nonobstructed bowel-gas pattern with large amount of stool in the colon. Negative for free air. 2. Moderate hiatal hernia. Electronically Signed   By: Donavan Foil M.D.   On: 05/19/2017 15:07    ASSESSMENT: Stage IIIC ovarian cancer.  PLAN:    1. Stage IIIC ovarian cancer: Given patient's advanced age she is not a surgical candidate. CT scan results reviewed independently confirming stage of disease. After lengthy discussion with the patient, she wishes to try palliative chemotherapy using single agent carboplatinum. Although, combination therapy is recommended, given patient's age and performance status, the toxicity was thought to be too high. Patient is now requesting a port placement. She tolerated cycle 1 of single agent carboplatinum last week without significant side effects. Return to clinic in 2 weeks for consideration of cycle 2. Plan to reimage at the conclusion of cycle 4. 2. Lupus anticoagulant: Patient was noted to have an elevated PTT as well as increased bleeding during her surgery for rectal prolapse. Continue to monitor closely and repeat lupus anticoagulant panel in 10-12 weeks. 3. Anemia: Patient's hemoglobin has trended down slightly. Proceed with 510 mg IV Feraheme today.  4. Dizziness: Likely secondary to poor PO intake and mild dehydration. Encouraged improved water consumption.  Approximately 30 minutes was spent in discussion of which greater than 50% was consultation.  Patient expressed understanding and was in agreement with this plan. She also understands that She can call clinic at any time with any questions, concerns, or complaints.   Cancer Staging Malignant neoplasm of ovary [facility]) Staging form: Ovary, Fallopian Tube, and Primary Peritoneal Carcinoma, AJCC 8th  Edition - Clinical stage from 05/29/2017: Stage IIIC (cT3c, cN1b, cM0) - Signed by Lloyd Huger, MD on 05/29/2017   Lloyd Huger, MD   06/09/2017 3:04 PM

## 2017-06-09 ENCOUNTER — Inpatient Hospital Stay: Payer: Medicare Other

## 2017-06-09 ENCOUNTER — Inpatient Hospital Stay (HOSPITAL_BASED_OUTPATIENT_CLINIC_OR_DEPARTMENT_OTHER): Payer: Medicare Other | Admitting: Oncology

## 2017-06-09 VITALS — BP 139/82 | HR 98 | Temp 97.7°F | Resp 20 | Wt 90.9 lb

## 2017-06-09 DIAGNOSIS — R12 Heartburn: Secondary | ICD-10-CM

## 2017-06-09 DIAGNOSIS — M1990 Unspecified osteoarthritis, unspecified site: Secondary | ICD-10-CM

## 2017-06-09 DIAGNOSIS — R42 Dizziness and giddiness: Secondary | ICD-10-CM

## 2017-06-09 DIAGNOSIS — D500 Iron deficiency anemia secondary to blood loss (chronic): Secondary | ICD-10-CM

## 2017-06-09 DIAGNOSIS — R188 Other ascites: Secondary | ICD-10-CM

## 2017-06-09 DIAGNOSIS — R110 Nausea: Secondary | ICD-10-CM

## 2017-06-09 DIAGNOSIS — K219 Gastro-esophageal reflux disease without esophagitis: Secondary | ICD-10-CM

## 2017-06-09 DIAGNOSIS — R160 Hepatomegaly, not elsewhere classified: Secondary | ICD-10-CM

## 2017-06-09 DIAGNOSIS — R5381 Other malaise: Secondary | ICD-10-CM | POA: Diagnosis not present

## 2017-06-09 DIAGNOSIS — C786 Secondary malignant neoplasm of retroperitoneum and peritoneum: Secondary | ICD-10-CM

## 2017-06-09 DIAGNOSIS — D6862 Lupus anticoagulant syndrome: Secondary | ICD-10-CM

## 2017-06-09 DIAGNOSIS — D649 Anemia, unspecified: Secondary | ICD-10-CM | POA: Diagnosis not present

## 2017-06-09 DIAGNOSIS — R531 Weakness: Secondary | ICD-10-CM | POA: Diagnosis not present

## 2017-06-09 DIAGNOSIS — Z800 Family history of malignant neoplasm of digestive organs: Secondary | ICD-10-CM

## 2017-06-09 DIAGNOSIS — C569 Malignant neoplasm of unspecified ovary: Secondary | ICD-10-CM

## 2017-06-09 DIAGNOSIS — R5383 Other fatigue: Secondary | ICD-10-CM | POA: Diagnosis not present

## 2017-06-09 DIAGNOSIS — M419 Scoliosis, unspecified: Secondary | ICD-10-CM

## 2017-06-09 DIAGNOSIS — E860 Dehydration: Secondary | ICD-10-CM | POA: Diagnosis not present

## 2017-06-09 DIAGNOSIS — Z79899 Other long term (current) drug therapy: Secondary | ICD-10-CM

## 2017-06-09 DIAGNOSIS — M3500 Sicca syndrome, unspecified: Secondary | ICD-10-CM

## 2017-06-09 DIAGNOSIS — R630 Anorexia: Secondary | ICD-10-CM

## 2017-06-09 DIAGNOSIS — Z801 Family history of malignant neoplasm of trachea, bronchus and lung: Secondary | ICD-10-CM

## 2017-06-09 DIAGNOSIS — I10 Essential (primary) hypertension: Secondary | ICD-10-CM

## 2017-06-09 DIAGNOSIS — K623 Rectal prolapse: Secondary | ICD-10-CM

## 2017-06-09 DIAGNOSIS — Z9889 Other specified postprocedural states: Secondary | ICD-10-CM

## 2017-06-09 DIAGNOSIS — K449 Diaphragmatic hernia without obstruction or gangrene: Secondary | ICD-10-CM

## 2017-06-09 LAB — CBC WITH DIFFERENTIAL/PLATELET
Basophils Absolute: 0.1 10*3/uL (ref 0–0.1)
Basophils Relative: 1 %
Eosinophils Absolute: 0 10*3/uL (ref 0–0.7)
Eosinophils Relative: 1 %
HCT: 25.7 % — ABNORMAL LOW (ref 35.0–47.0)
Hemoglobin: 8.3 g/dL — ABNORMAL LOW (ref 12.0–16.0)
Lymphocytes Relative: 13 %
Lymphs Abs: 0.8 10*3/uL — ABNORMAL LOW (ref 1.0–3.6)
MCH: 30 pg (ref 26.0–34.0)
MCHC: 32.5 g/dL (ref 32.0–36.0)
MCV: 92.4 fL (ref 80.0–100.0)
Monocytes Absolute: 0.4 10*3/uL (ref 0.2–0.9)
Monocytes Relative: 8 %
Neutro Abs: 4.4 10*3/uL (ref 1.4–6.5)
Neutrophils Relative %: 77 %
Platelets: 513 10*3/uL — ABNORMAL HIGH (ref 150–440)
RBC: 2.78 MIL/uL — ABNORMAL LOW (ref 3.80–5.20)
RDW: 15.4 % — ABNORMAL HIGH (ref 11.5–14.5)
WBC: 5.7 10*3/uL (ref 3.6–11.0)

## 2017-06-09 LAB — BASIC METABOLIC PANEL
Anion gap: 11 (ref 5–15)
BUN: 39 mg/dL — ABNORMAL HIGH (ref 6–20)
CO2: 21 mmol/L — ABNORMAL LOW (ref 22–32)
Calcium: 8.4 mg/dL — ABNORMAL LOW (ref 8.9–10.3)
Chloride: 105 mmol/L (ref 101–111)
Creatinine, Ser: 0.97 mg/dL (ref 0.44–1.00)
GFR calc Af Amer: 59 mL/min — ABNORMAL LOW (ref >60)
GFR calc non Af Amer: 51 mL/min — ABNORMAL LOW (ref >60)
Glucose, Bld: 130 mg/dL — ABNORMAL HIGH (ref 65–99)
Potassium: 4.3 mmol/L (ref 3.5–5.1)
Sodium: 137 mmol/L (ref 135–145)

## 2017-06-09 MED ORDER — SODIUM CHLORIDE 0.9 % IV SOLN
Freq: Once | INTRAVENOUS | Status: AC
Start: 2017-06-09 — End: 2017-06-09
  Administered 2017-06-09: 15:00:00 via INTRAVENOUS
  Filled 2017-06-09: qty 1000

## 2017-06-09 MED ORDER — SODIUM CHLORIDE 0.9 % IV SOLN
510.0000 mg | Freq: Once | INTRAVENOUS | Status: AC
  Administered 2017-06-09: 510 mg via INTRAVENOUS
  Filled 2017-06-09: qty 17

## 2017-06-09 MED ORDER — CYCLOBENZAPRINE HCL 10 MG PO TABS: 10.0000 mg | ORAL_TABLET | Freq: Every day | ORAL | 2 refills | Status: DC

## 2017-06-09 NOTE — Progress Notes (Signed)
Patient here today for follow up after receiving her first chemotherapy treatment with Carbo last week. Patient reports she had some dizziness and one episode of nausea that was relieved with Zofran.

## 2017-06-10 ENCOUNTER — Encounter (INDEPENDENT_AMBULATORY_CARE_PROVIDER_SITE_OTHER): Payer: Self-pay

## 2017-06-10 LAB — CA 125: Cancer Antigen (CA) 125: 368 U/mL — ABNORMAL HIGH (ref 0.0–38.1)

## 2017-06-11 ENCOUNTER — Other Ambulatory Visit (INDEPENDENT_AMBULATORY_CARE_PROVIDER_SITE_OTHER): Payer: Self-pay | Admitting: Vascular Surgery

## 2017-06-15 MED ORDER — CEFAZOLIN SODIUM-DEXTROSE 2-4 GM/100ML-% IV SOLN: 2.0000 g | Freq: Once | INTRAVENOUS | Status: DC

## 2017-06-16 ENCOUNTER — Encounter: Payer: Self-pay | Admitting: Vascular Surgery

## 2017-06-16 ENCOUNTER — Ambulatory Visit
Admission: RE | Admit: 2017-06-16 | Discharge: 2017-06-16 | Disposition: A | Discharge status: Home / Self Care | Payer: Medicare Other | Source: Ambulatory Visit | Attending: Vascular Surgery | Admitting: Vascular Surgery

## 2017-06-16 ENCOUNTER — Encounter
Admission: RE | Disposition: A | Discharge status: Home / Self Care | Payer: Self-pay | Source: Ambulatory Visit | Attending: Vascular Surgery

## 2017-06-16 DIAGNOSIS — J45909 Unspecified asthma, uncomplicated: Secondary | ICD-10-CM | POA: Insufficient documentation

## 2017-06-16 DIAGNOSIS — Z800 Family history of malignant neoplasm of digestive organs: Secondary | ICD-10-CM | POA: Diagnosis not present

## 2017-06-16 DIAGNOSIS — Z801 Family history of malignant neoplasm of trachea, bronchus and lung: Secondary | ICD-10-CM | POA: Diagnosis not present

## 2017-06-16 DIAGNOSIS — I10 Essential (primary) hypertension: Secondary | ICD-10-CM | POA: Diagnosis not present

## 2017-06-16 DIAGNOSIS — Z859 Personal history of malignant neoplasm, unspecified: Secondary | ICD-10-CM | POA: Insufficient documentation

## 2017-06-16 DIAGNOSIS — M1990 Unspecified osteoarthritis, unspecified site: Secondary | ICD-10-CM | POA: Insufficient documentation

## 2017-06-16 DIAGNOSIS — Z90710 Acquired absence of both cervix and uterus: Secondary | ICD-10-CM | POA: Insufficient documentation

## 2017-06-16 DIAGNOSIS — Z808 Family history of malignant neoplasm of other organs or systems: Secondary | ICD-10-CM | POA: Diagnosis not present

## 2017-06-16 DIAGNOSIS — Z9841 Cataract extraction status, right eye: Secondary | ICD-10-CM | POA: Insufficient documentation

## 2017-06-16 DIAGNOSIS — K219 Gastro-esophageal reflux disease without esophagitis: Secondary | ICD-10-CM | POA: Diagnosis not present

## 2017-06-16 DIAGNOSIS — Z825 Family history of asthma and other chronic lower respiratory diseases: Secondary | ICD-10-CM | POA: Insufficient documentation

## 2017-06-16 DIAGNOSIS — C569 Malignant neoplasm of unspecified ovary: Secondary | ICD-10-CM | POA: Diagnosis not present

## 2017-06-16 DIAGNOSIS — Z9889 Other specified postprocedural states: Secondary | ICD-10-CM | POA: Diagnosis not present

## 2017-06-16 DIAGNOSIS — Z833 Family history of diabetes mellitus: Secondary | ICD-10-CM | POA: Insufficient documentation

## 2017-06-16 DIAGNOSIS — Z8379 Family history of other diseases of the digestive system: Secondary | ICD-10-CM | POA: Diagnosis not present

## 2017-06-16 DIAGNOSIS — Z9842 Cataract extraction status, left eye: Secondary | ICD-10-CM | POA: Diagnosis not present

## 2017-06-16 DIAGNOSIS — Z8261 Family history of arthritis: Secondary | ICD-10-CM | POA: Insufficient documentation

## 2017-06-16 DIAGNOSIS — M3500 Sicca syndrome, unspecified: Secondary | ICD-10-CM | POA: Diagnosis not present

## 2017-06-16 DIAGNOSIS — D500 Iron deficiency anemia secondary to blood loss (chronic): Secondary | ICD-10-CM | POA: Diagnosis not present

## 2017-06-16 DIAGNOSIS — Z8249 Family history of ischemic heart disease and other diseases of the circulatory system: Secondary | ICD-10-CM | POA: Diagnosis not present

## 2017-06-16 HISTORY — PX: PORTA CATH INSERTION: CATH118285

## 2017-06-16 SURGERY — PORTA CATH INSERTION
Anesthesia: Moderate Sedation

## 2017-06-16 MED ORDER — ONDANSETRON HCL 4 MG/2ML IJ SOLN: 4.0000 mg | Freq: Four times a day (QID) | INTRAMUSCULAR | Status: DC | PRN

## 2017-06-16 MED ORDER — HYDROMORPHONE HCL 1 MG/ML IJ SOLN: 1.0000 mg | Freq: Once | INTRAMUSCULAR | Status: DC | PRN

## 2017-06-16 MED ORDER — HEPARIN SODIUM (PORCINE) 1000 UNIT/ML IJ SOLN
INTRAMUSCULAR | Status: AC
  Filled 2017-06-16: qty 1

## 2017-06-16 MED ORDER — FENTANYL CITRATE (PF) 100 MCG/2ML IJ SOLN
INTRAMUSCULAR | Status: AC
  Filled 2017-06-16: qty 2

## 2017-06-16 MED ORDER — SODIUM CHLORIDE 0.9 % IR SOLN
Freq: Once | Status: DC
  Filled 2017-06-16: qty 2

## 2017-06-16 MED ORDER — MIDAZOLAM HCL 5 MG/5ML IJ SOLN
INTRAMUSCULAR | Status: AC
  Filled 2017-06-16: qty 5

## 2017-06-16 MED ORDER — FENTANYL CITRATE (PF) 100 MCG/2ML IJ SOLN
INTRAMUSCULAR | Status: DC | PRN
  Administered 2017-06-16: 25 ug via INTRAVENOUS
  Administered 2017-06-16: 50 ug via INTRAVENOUS

## 2017-06-16 MED ORDER — SODIUM CHLORIDE 0.9 % IV SOLN
INTRAVENOUS | Status: DC
  Administered 2017-06-16: 10:00:00 via INTRAVENOUS

## 2017-06-16 MED ORDER — MIDAZOLAM HCL 2 MG/2ML IJ SOLN
INTRAMUSCULAR | Status: DC | PRN
  Administered 2017-06-16 (×2): 2 mg via INTRAVENOUS

## 2017-06-16 MED ORDER — LIDOCAINE-EPINEPHRINE (PF) 1 %-1:200000 IJ SOLN
INTRAMUSCULAR | Status: AC
  Filled 2017-06-16: qty 30

## 2017-06-16 SURGICAL SUPPLY — 11 items
DERMABOND ADVANCED (GAUZE/BANDAGES/DRESSINGS) ×2
DERMABOND ADVANCED .7 DNX12 (GAUZE/BANDAGES/DRESSINGS) ×1 IMPLANT
GAUZE SPONGE 4X4 12PLY STRL (GAUZE/BANDAGES/DRESSINGS) ×3 IMPLANT
KIT PORT POWER 8FR ISP CVUE (Miscellaneous) ×3 IMPLANT
PACK ANGIOGRAPHY (CUSTOM PROCEDURE TRAY) ×3 IMPLANT
PAD GROUND ADULT SPLIT (MISCELLANEOUS) ×3 IMPLANT
PENCIL ELECTRO HAND CTR (MISCELLANEOUS) ×3 IMPLANT
SUT MNCRL AB 4-0 PS2 18 (SUTURE) ×3 IMPLANT
SUT PROLENE 0 CT 1 30 (SUTURE) ×3 IMPLANT
SUTURE VIC 3-0 (SUTURE) ×3 IMPLANT
TOWEL OR 17X26 4PK STRL BLUE (TOWEL DISPOSABLE) ×3 IMPLANT

## 2017-06-16 NOTE — Op Note (Signed)
      [facility]       Operative Note  Date: 06/16/2017  Preoperative diagnosis:  1.  Ovarian cancer  Postoperative diagnosis:  Same as above  Procedures: #1. Ultrasound guidance for vascular access to the right internal jugular vein. #2. Fluoroscopic guidance for placement of catheter. #3. Placement of CT compatible Port-A-Cath, right internal jugular vein.  Surgeon: Leotis Pain, MD.   Anesthesia: Local with moderate conscious sedation for approximately 15 minutes using 4 mg of Versed and 75 Mcg of Fentanyl  Fluoroscopy time: less than 1 minute  Contrast used: 0  Estimated blood loss: 5 cc  Indication for the procedure:  The patient is a 83 y.o.female with ovarian cancer.  The patient needs a Port-A-Cath for durable venous access, chemotherapy, lab draws, and CT scans. We are asked to place this. Risks and benefits were discussed and informed consent was obtained.  Description of procedure: The patient was brought to the vascular and interventional radiology suite.  Moderate conscious sedation was administered throughout the procedure during a face to face encounter with the patient with my supervision of the RN administering medicines and monitoring the patient's vital signs, pulse oximetry, telemetry and mental status throughout from the start of the procedure until the patient was taken to the recovery room. The right neck chest and shoulder were sterilely prepped and draped, and a sterile surgical field was created. Ultrasound was used to help visualize a patent right internal jugular vein. This was then accessed under direct ultrasound guidance without difficulty with the Seldinger needle and a permanent image was recorded. A J-wire was placed. After skin nick and dilatation, the peel-away sheath was then placed over the wire. I then anesthetized an area under the clavicle approximately 1-2 fingerbreadths. A transverse incision was created and an inferior pocket  was created with electrocautery and blunt dissection. The port was then brought onto the field, placed into the pocket and secured to the chest wall with 2 Prolene sutures. The catheter was connected to the port and tunneled from the subclavicular incision to the access site. Fluoroscopic guidance was then used to cut the catheter to an appropriate length. The catheter was then placed through the peel-away sheath and the peel-away sheath was removed. The catheter tip was parked in excellent location under fluorocoscopic guidance in the superior vena cava just above the right atrium. The pocket was then irrigated with antibiotic impregnated saline and the wound was closed with a running 3-0 Vicryl and a 4-0 Monocryl. The access incision was closed with a single 4-0 Monocryl. The Huber needle was used to withdraw blood and flush the port with heparinized saline. Dermabond was then placed as a dressing. The patient tolerated the procedure well and was taken to the recovery room in stable condition.   Leotis Pain 06/16/2017 12:27 PM   This note was created with Dragon Medical transcription system. Any errors in dictation are purely unintentional.

## 2017-06-16 NOTE — Progress Notes (Signed)
[place]  [phone] [phone]) [phone]  ID: Tamara Mckinney OB: 08-Sep-1927  MR#: [id number]  UTM#:[id number]  Patient Care Team: [name], MD as PCP - General (Family Medicine) [name], RN as Registered Nurse  CHIEF COMPLAINT: Stage IIIC ovarian cancer.  INTERVAL HISTORY: Patient returns to clinic today for further evaluation and consideration of cycle 2 of single agent carboplatinum. She continues to have mild dizziness, but otherwise feels well.  She has no other neurologic complaints.  She does not complain of abdominal pain or bloating. She denies any fevers. She denies any chest pain or shortness of breath. She has no nausea, vomiting, constipation, or diarrhea. She has no urinary complaints. Patient offers no further specific complaints.  REVIEW OF SYSTEMS:   Review of Systems  Constitutional: Negative.  Negative for fever, malaise/fatigue and weight loss.  Respiratory: Negative.  Negative for cough and shortness of breath.   Cardiovascular: Negative.  Negative for chest pain and leg swelling.  Gastrointestinal: Negative for abdominal pain, blood in stool, constipation, diarrhea, heartburn, melena, nausea and vomiting.  Genitourinary: Negative.   Musculoskeletal: Negative.   Skin: Negative.  Negative for rash.  Neurological: Positive for dizziness. Negative for weakness.  Endo/Heme/Allergies: Does not bruise/bleed easily.  Psychiatric/Behavioral: Negative.  The patient is not nervous/anxious.     As per HPI. Otherwise, a complete review of systems is negative.  PAST MEDICAL HISTORY: Past Medical History:  Diagnosis Date  . Anemia   . Arthritis   . Asthma   . Cancer ([place])   . Dyspnea   . GERD (gastroesophageal reflux disease)   . Hypertension   . Sjogren's disease ([place])     PAST SURGICAL HISTORY: Past Surgical History:  Procedure Laterality Date  . ABDOMINAL HYSTERECTOMY    . BREAST BIOPSY     x6  .  BUNIONECTOMY Bilateral   . CATARACT EXTRACTION, BILATERAL    . INCONTINENCE SURGERY    . TONSILLECTOMY    . VAGINA SURGERY     uncertain procedure performed    FAMILY HISTORY: Family History  Problem Relation Age of Onset  . Heart disease Mother   . Heart disease Father   . Heart disease Sister   . Heart attack Sister   . Ulcerative colitis Brother   . Lung cancer Brother   . Thyroid cancer Sister   . Asthma Sister   . Diabetes Sister   . Asthma Sister   . Pancreatic cancer Sister   . Dementia Sister   . Asthma Brother   . Heart disease Brother   . Asthma Brother   . Lung cancer Brother   . Asthma Brother   . Lung cancer Brother   . Lung cancer Brother   . Rheum arthritis Brother     ADVANCED DIRECTIVES (Y/N):  N  HEALTH MAINTENANCE: Social History   Tobacco Use  . Smoking status: Never Smoker  . Smokeless tobacco: Never Used  Substance Use Topics  . Alcohol use: No  . Drug use: No     Colonoscopy:  PAP:  Bone density:  Lipid panel:  No Known Allergies  Current Outpatient Medications  Medication Sig Dispense Refill  . acetaminophen (TYLENOL) 500 MG tablet Take 500 mg by mouth 2 (two) times daily as needed for moderate pain.     Marland Kitchen albuterol (PROVENTIL HFA;VENTOLIN HFA) 108 (90 Base) MCG/ACT inhaler Inhale 1-2 puffs into the lungs every 6 (six) hours as needed for wheezing or shortness of breath.    Marland Kitchen  amLODipine (NORVASC) 5 MG tablet Take 5 mg by mouth daily with breakfast.    . Carboxymeth-Glyc-Polysorb PF (REFRESH OPTIVE ADVANCED PF) 0.5-1-0.5 % SOLN Place 1-2 drops into both eyes 3 (three) times daily as needed. For dry, irritated eyes    . Carboxymethylcellulose Sod PF (REFRESH CELLUVISC) 1 % GEL Place 1 drop into both eyes at bedtime. In the middle of the night     . cetirizine (ZYRTEC) 10 MG tablet Take 10 mg by mouth at bedtime as needed for allergies.    . cyclobenzaprine (FLEXERIL) 10 MG tablet Take 1 tablet (10 mg total) by mouth at bedtime. 30  tablet 2  . docusate sodium (COLACE) 100 MG capsule Take 100-300 mg by mouth at bedtime as needed for mild constipation.    Marland Kitchen esomeprazole (NEXIUM) 40 MG capsule Take 40 mg by mouth daily before breakfast.     . fluticasone furoate-vilanterol (BREO ELLIPTA) 100-25 MCG/INH AEPB Inhale 1 puff into the lungs at bedtime.    . furosemide (LASIX) 40 MG tablet Take 40 mg by mouth daily with breakfast.     . hypromellose (SYSTANE OVERNIGHT THERAPY) 0.3 % GEL ophthalmic ointment Place 1 application into both eyes at bedtime.    Marland Kitchen ipratropium-albuterol (DUONEB) 0.5-2.5 (3) MG/3ML SOLN Take 3 mLs by nebulization every 6 (six) hours as needed. For wheezing/shortness of breath    . lidocaine-prilocaine (EMLA) cream Apply 1 application topically as needed. Apply small amount to port site at least 1 hour prior to it being accessed, cover with plastic wrap 30 g 1  . losartan (COZAAR) 100 MG tablet Take 100 mg by mouth at bedtime.    . meclizine (ANTIVERT) 25 MG tablet Take 25 mg by mouth 3 (three) times daily as needed (for vertigo).     . meloxicam (MOBIC) 15 MG tablet Take 15 mg by mouth daily with breakfast.    . montelukast (SINGULAIR) 10 MG tablet Take 10 mg by mouth at bedtime.    . ondansetron (ZOFRAN) 8 MG tablet Take 1 tablet (8 mg total) by mouth 2 (two) times daily as needed for refractory nausea / vomiting. 60 tablet 2  . prochlorperazine (COMPAZINE) 10 MG tablet Take 1 tablet (10 mg total) by mouth every 6 (six) hours as needed (Nausea or vomiting). 60 tablet 2  . triamcinolone cream (KENALOG) 0.5 % Apply 1 application topically 3 (three) times daily as needed. For inflammation    . Ascorbic Acid (VITAMIN C) 1000 MG tablet Take 1,000 mg by mouth daily.    . Calcium Carb-Cholecalciferol (CALTRATE 600+D) 600-800 MG-UNIT TABS Take 1 tablet by mouth daily.    . Cholecalciferol (VITAMIN D3) 2000 units capsule Take 2,000 Units by mouth daily.    . Cyanocobalamin (RA VITAMIN B12) 2000 MCG TBCR Take 2,000 mcg  by mouth daily.    . DHA-EPA-Flaxseed Oil-Vitamin E (THERA TEARS NUTRITION PO) Take 2 tablets by mouth daily.    . Glucosamine-MSM-Hyaluronic Acd (JOINT HEALTH PO) Take 1 tablet by mouth daily.     No current facility-administered medications for this visit.     OBJECTIVE: Vitals:   06/22/17 1142  BP: 120/74  Pulse: 93  Resp: 20  Temp: (!) 97.4 F (36.3 C)     Body mass index is 19.22 kg/m.    ECOG FS:1 - Symptomatic but completely ambulatory  General: Well-developed, well-nourished, no acute distress. Eyes: Pink conjunctiva, anicteric sclera. Lungs: Clear to auscultation bilaterally. Heart: Regular rate and rhythm. No rubs, murmurs, or gallops. Abdomen:  Soft, mildly distended. No organomegaly noted, normoactive bowel sounds. Musculoskeletal: No edema, cyanosis, or clubbing. Neuro: Alert, answering all questions appropriately. Cranial nerves grossly intact. Skin: No rashes or petechiae noted. Psych: Normal affect.     LAB RESULTS:  Lab Results  Component Value Date   NA 133 (L) 06/22/2017   K 4.3 06/22/2017   CL 103 06/22/2017   CO2 22 06/22/2017   GLUCOSE 90 06/22/2017   BUN 57 (H) 06/22/2017   CREATININE 1.18 (H) 06/22/2017   CALCIUM 8.5 (L) 06/22/2017   PROT 6.3 (L) 05/19/2017   ALBUMIN 2.6 (L) 05/19/2017   AST 29 05/19/2017   ALT 13 (L) 05/19/2017   ALKPHOS 77 05/19/2017   BILITOT 0.5 05/19/2017   GFRNONAA 40 (L) 06/22/2017   GFRAA 47 (L) 06/22/2017    Lab Results  Component Value Date   WBC 3.2 (L) 06/22/2017   NEUTROABS 2.0 06/22/2017   HGB 8.0 (L) 06/22/2017   HCT 24.2 (L) 06/22/2017   MCV 93.9 06/22/2017   PLT 261 06/22/2017     STUDIES: No results found.  ASSESSMENT: Stage IIIC ovarian cancer.  PLAN:    1. Stage IIIC ovarian cancer: Given patient's advanced age she is not a surgical candidate. CT scan results reviewed independently confirming stage of disease. After lengthy discussion with the patient, she wishes to try palliative  chemotherapy using single agent carboplatinum. Although, combination therapy is recommended, given patient's age and performance status, the toxicity was thought to be too high.  Proceed with cycle 2 of single agent carboplatinum today. Return to clinic in 3 weeks for consideration of cycle 3. Plan to reimage at the conclusion of cycle 4. 2. Lupus anticoagulant: Patient was noted to have an elevated PTT as well as increased bleeding during her surgery for rectal prolapse. Continue to monitor closely and repeat lupus anticoagulant panel in 10-12 weeks. 3. Anemia: Patient's hemoglobin has trended down slightly.  She received 2 doses of IV Feraheme in October 2018.  4. Dizziness: Likely secondary to poor PO intake and mild dehydration. Encouraged improved water consumption.  Approximately 30 minutes was spent in discussion of which greater than 50% was consultation.  Patient expressed understanding and was in agreement with this plan. She also understands that She can call clinic at any time with any questions, concerns, or complaints.   Cancer Staging Malignant neoplasm of ovary [facility]) Staging form: Ovary, Fallopian Tube, and Primary Peritoneal Carcinoma, AJCC 8th Edition - Clinical stage from 05/29/2017: Stage IIIC (cT3c, cN1b, cM0) - Signed by Lloyd Huger, MD on 05/29/2017   Lloyd Huger, MD   06/23/2017 1:16 PM

## 2017-06-16 NOTE — Progress Notes (Signed)
Dr. Lucky Cowboy at bedside speaking to pt. And family re: power port insertion. All verbalized understanding.

## 2017-06-16 NOTE — H&P (Signed)
[facility] History & Physical Update  The patient was interviewed and re-examined.  The patient's previous History and Physical has been reviewed and is unchanged.  There is no change in the plan of care. We plan to proceed with the scheduled procedure.  Leotis Pain, MD  06/16/2017, 9:40 AM

## 2017-06-18 ENCOUNTER — Telehealth: Payer: Self-pay | Admitting: *Deleted

## 2017-06-18 MED ORDER — LIDOCAINE-PRILOCAINE 2.5-2.5 % EX CREA: 1.0000 "application " | TOPICAL_CREAM | CUTANEOUS | 1 refills | Status: DC | PRN

## 2017-06-18 NOTE — Telephone Encounter (Signed)
Needs EMLA cream sent to pharmacy, had port placed 10/31 and having chemotherapy Tuesday

## 2017-06-21 ENCOUNTER — Other Ambulatory Visit: Payer: Self-pay | Admitting: Oncology

## 2017-06-22 ENCOUNTER — Inpatient Hospital Stay: Payer: Medicare Other

## 2017-06-22 ENCOUNTER — Encounter: Payer: Self-pay | Admitting: Oncology

## 2017-06-22 ENCOUNTER — Inpatient Hospital Stay: Payer: Medicare Other | Attending: Oncology | Admitting: Oncology

## 2017-06-22 VITALS — BP 120/74 | HR 93 | Temp 97.4°F | Resp 20 | Wt 88.8 lb

## 2017-06-22 DIAGNOSIS — K219 Gastro-esophageal reflux disease without esophagitis: Secondary | ICD-10-CM | POA: Insufficient documentation

## 2017-06-22 DIAGNOSIS — I10 Essential (primary) hypertension: Secondary | ICD-10-CM | POA: Insufficient documentation

## 2017-06-22 DIAGNOSIS — T451X5S Adverse effect of antineoplastic and immunosuppressive drugs, sequela: Secondary | ICD-10-CM | POA: Insufficient documentation

## 2017-06-22 DIAGNOSIS — Z5111 Encounter for antineoplastic chemotherapy: Secondary | ICD-10-CM | POA: Insufficient documentation

## 2017-06-22 DIAGNOSIS — D701 Agranulocytosis secondary to cancer chemotherapy: Secondary | ICD-10-CM | POA: Diagnosis not present

## 2017-06-22 DIAGNOSIS — R791 Abnormal coagulation profile: Secondary | ICD-10-CM | POA: Diagnosis not present

## 2017-06-22 DIAGNOSIS — Z800 Family history of malignant neoplasm of digestive organs: Secondary | ICD-10-CM | POA: Insufficient documentation

## 2017-06-22 DIAGNOSIS — R42 Dizziness and giddiness: Secondary | ICD-10-CM | POA: Insufficient documentation

## 2017-06-22 DIAGNOSIS — E860 Dehydration: Secondary | ICD-10-CM | POA: Insufficient documentation

## 2017-06-22 DIAGNOSIS — C569 Malignant neoplasm of unspecified ovary: Secondary | ICD-10-CM

## 2017-06-22 DIAGNOSIS — Z801 Family history of malignant neoplasm of trachea, bronchus and lung: Secondary | ICD-10-CM | POA: Insufficient documentation

## 2017-06-22 DIAGNOSIS — C786 Secondary malignant neoplasm of retroperitoneum and peritoneum: Secondary | ICD-10-CM | POA: Insufficient documentation

## 2017-06-22 DIAGNOSIS — Z79899 Other long term (current) drug therapy: Secondary | ICD-10-CM | POA: Insufficient documentation

## 2017-06-22 DIAGNOSIS — D649 Anemia, unspecified: Secondary | ICD-10-CM | POA: Insufficient documentation

## 2017-06-22 DIAGNOSIS — M1990 Unspecified osteoarthritis, unspecified site: Secondary | ICD-10-CM | POA: Diagnosis not present

## 2017-06-22 DIAGNOSIS — Z808 Family history of malignant neoplasm of other organs or systems: Secondary | ICD-10-CM | POA: Diagnosis not present

## 2017-06-22 DIAGNOSIS — M3500 Sicca syndrome, unspecified: Secondary | ICD-10-CM | POA: Diagnosis not present

## 2017-06-22 LAB — CBC WITH DIFFERENTIAL/PLATELET
Basophils Absolute: 0 10*3/uL (ref 0–0.1)
Basophils Relative: 1 %
Eosinophils Absolute: 0 10*3/uL (ref 0–0.7)
Eosinophils Relative: 1 %
HCT: 24.2 % — ABNORMAL LOW (ref 35.0–47.0)
Hemoglobin: 8 g/dL — ABNORMAL LOW (ref 12.0–16.0)
Lymphocytes Relative: 26 %
Lymphs Abs: 0.8 10*3/uL — ABNORMAL LOW (ref 1.0–3.6)
MCH: 31.1 pg (ref 26.0–34.0)
MCHC: 33.1 g/dL (ref 32.0–36.0)
MCV: 93.9 fL (ref 80.0–100.0)
Monocytes Absolute: 0.4 10*3/uL (ref 0.2–0.9)
Monocytes Relative: 11 %
Neutro Abs: 2 10*3/uL (ref 1.4–6.5)
Neutrophils Relative %: 61 %
Platelets: 261 10*3/uL (ref 150–440)
RBC: 2.58 MIL/uL — ABNORMAL LOW (ref 3.80–5.20)
RDW: 19.4 % — ABNORMAL HIGH (ref 11.5–14.5)
WBC: 3.2 10*3/uL — ABNORMAL LOW (ref 3.6–11.0)

## 2017-06-22 LAB — BASIC METABOLIC PANEL
Anion gap: 8 (ref 5–15)
BUN: 57 mg/dL — ABNORMAL HIGH (ref 6–20)
CO2: 22 mmol/L (ref 22–32)
Calcium: 8.5 mg/dL — ABNORMAL LOW (ref 8.9–10.3)
Chloride: 103 mmol/L (ref 101–111)
Creatinine, Ser: 1.18 mg/dL — ABNORMAL HIGH (ref 0.44–1.00)
GFR calc Af Amer: 47 mL/min — ABNORMAL LOW (ref >60)
GFR calc non Af Amer: 40 mL/min — ABNORMAL LOW (ref >60)
Glucose, Bld: 90 mg/dL (ref 65–99)
Potassium: 4.3 mmol/L (ref 3.5–5.1)
Sodium: 133 mmol/L — ABNORMAL LOW (ref 135–145)

## 2017-06-22 MED ORDER — SODIUM CHLORIDE 0.9 % IV SOLN
Freq: Once | INTRAVENOUS | Status: AC
  Administered 2017-06-22: 12:00:00 via INTRAVENOUS
  Filled 2017-06-22: qty 1000

## 2017-06-22 MED ORDER — PALONOSETRON HCL INJECTION 0.25 MG/5ML
0.2500 mg | Freq: Once | INTRAVENOUS | Status: AC
  Administered 2017-06-22: 0.25 mg via INTRAVENOUS
  Filled 2017-06-22: qty 5

## 2017-06-22 MED ORDER — HEPARIN SOD (PORK) LOCK FLUSH 100 UNIT/ML IV SOLN
500.0000 [IU] | Freq: Once | INTRAVENOUS | Status: AC | PRN
  Administered 2017-06-22: 500 [IU]
  Filled 2017-06-22: qty 5

## 2017-06-22 MED ORDER — SODIUM CHLORIDE 0.9 % IV SOLN
243.0000 mg | Freq: Once | INTRAVENOUS | Status: AC
  Administered 2017-06-22: 240 mg via INTRAVENOUS
  Filled 2017-06-22: qty 24

## 2017-06-22 MED ORDER — FOSAPREPITANT DIMEGLUMINE INJECTION 150 MG
Freq: Once | INTRAVENOUS | Status: AC
  Administered 2017-06-22: 12:00:00 via INTRAVENOUS
  Filled 2017-06-22: qty 5

## 2017-06-22 NOTE — Progress Notes (Signed)
Patient reports she is feeling well today, reports dizziness.

## 2017-06-23 LAB — CA 125: Cancer Antigen (CA) 125: 132.2 U/mL — ABNORMAL HIGH (ref 0.0–38.1)

## 2017-07-11 NOTE — Progress Notes (Signed)
[name]  Telephone:(336) [phone] [phone]) [phone]  ID: Tamara Mckinney OB: 1928-06-02  MR#: [id number]  JJK#:[id number]  Patient Care Team: [name], MD as PCP - General (Family Medicine) [name], RN as Registered Nurse  CHIEF COMPLAINT: Stage IIIC ovarian cancer.  INTERVAL HISTORY: Patient returns to clinic today for further evaluation and consideration of cycle 3 of single agent carboplatinum. She currently feels well and is asymptomatic.  She has no neurologic complaints.  She does not complain of abdominal pain or bloating. She denies any fevers. She denies any chest pain or shortness of breath. She has no nausea, vomiting, constipation, or diarrhea. She has no urinary complaints. Patient offers no specific complaints today.  REVIEW OF SYSTEMS:   Review of Systems  Constitutional: Negative.  Negative for fever, malaise/fatigue and weight loss.  Respiratory: Negative.  Negative for cough and shortness of breath.   Cardiovascular: Negative.  Negative for chest pain and leg swelling.  Gastrointestinal: Negative for abdominal pain, blood in stool, constipation, diarrhea, heartburn, melena, nausea and vomiting.  Genitourinary: Negative.   Musculoskeletal: Negative.   Skin: Negative.  Negative for rash.  Neurological: Negative.  Negative for dizziness and weakness.  Endo/Heme/Allergies: Does not bruise/bleed easily.  Psychiatric/Behavioral: Negative.  The patient is not nervous/anxious.     As per HPI. Otherwise, a complete review of systems is negative.  PAST MEDICAL HISTORY: Past Medical History:  Diagnosis Date  . Anemia   . Arthritis   . Asthma   . Cancer ([place])   . Dyspnea   . GERD (gastroesophageal reflux disease)   . Hypertension   . Sjogren's disease ([place])     PAST SURGICAL HISTORY: Past Surgical History:  Procedure Laterality Date  . ABDOMINAL HYSTERECTOMY    . BREAST BIOPSY     x6  . BUNIONECTOMY Bilateral   . CATARACT  EXTRACTION, BILATERAL    . FLEXIBLE SIGMOIDOSCOPY N/A 05/21/2017   Procedure: FLEXIBLE SIGMOIDOSCOPY;  Surgeon: Lin Landsman, MD;  [facility] ENDOSCOPY;  Service: Gastroenterology;  Laterality: N/A;  . INCONTINENCE SURGERY    . PORTA CATH INSERTION N/A 06/16/2017   Procedure: PORTA CATH INSERTION;  Surgeon: Algernon Huxley, MD;  Location: [facility];  Service: Cardiovascular;  Laterality: N/A;  . REPAIR OF RECTAL PROLAPSE N/A 05/11/2017   Procedure: REPAIR OF RECTAL PROLAPSE;  Surgeon: Leonie Green, MD;  Location: [facility] ORS;  Service: General;  Laterality: N/A;  . TONSILLECTOMY    . VAGINA SURGERY     uncertain procedure performed    FAMILY HISTORY: Family History  Problem Relation Age of Onset  . Heart disease Mother   . Heart disease Father   . Heart disease Sister   . Heart attack Sister   . Ulcerative colitis Brother   . Lung cancer Brother   . Thyroid cancer Sister   . Asthma Sister   . Diabetes Sister   . Asthma Sister   . Pancreatic cancer Sister   . Dementia Sister   . Asthma Brother   . Heart disease Brother   . Asthma Brother   . Lung cancer Brother   . Asthma Brother   . Lung cancer Brother   . Lung cancer Brother   . Rheum arthritis Brother     ADVANCED DIRECTIVES (Y/N):  N  HEALTH MAINTENANCE: Social History   Tobacco Use  . Smoking status: Never Smoker  . Smokeless tobacco: Never Used  Substance Use Topics  . Alcohol use: No  .  Drug use: No     Colonoscopy:  PAP:  Bone density:  Lipid panel:  No Known Allergies  Current Outpatient Medications  Medication Sig Dispense Refill  . acetaminophen (TYLENOL) 500 MG tablet Take 500 mg by mouth 2 (two) times daily as needed for moderate pain.     Marland Kitchen albuterol (PROVENTIL HFA;VENTOLIN HFA) 108 (90 Base) MCG/ACT inhaler Inhale 1-2 puffs into the lungs every 6 (six) hours as needed for wheezing or shortness of breath.    Marland Kitchen amLODipine (NORVASC) 5 MG tablet Take 5 mg by mouth daily  with breakfast.    . Carboxymeth-Glyc-Polysorb PF (REFRESH OPTIVE ADVANCED PF) 0.5-1-0.5 % SOLN Place 1-2 drops into both eyes 3 (three) times daily as needed. For dry, irritated eyes    . Carboxymethylcellulose Sod PF (REFRESH CELLUVISC) 1 % GEL Place 1 drop into both eyes at bedtime. In the middle of the night     . cetirizine (ZYRTEC) 10 MG tablet Take 10 mg by mouth at bedtime as needed for allergies.    . cyclobenzaprine (FLEXERIL) 10 MG tablet Take 1 tablet (10 mg total) by mouth at bedtime. 30 tablet 2  . docusate sodium (COLACE) 100 MG capsule Take 100-300 mg by mouth at bedtime as needed for mild constipation.    Marland Kitchen esomeprazole (NEXIUM) 40 MG capsule Take 40 mg by mouth daily before breakfast.     . fluticasone furoate-vilanterol (BREO ELLIPTA) 100-25 MCG/INH AEPB Inhale 1 puff into the lungs at bedtime.    . furosemide (LASIX) 40 MG tablet Take 40 mg by mouth daily with breakfast.     . hypromellose (SYSTANE OVERNIGHT THERAPY) 0.3 % GEL ophthalmic ointment Place 1 application into both eyes at bedtime.    Marland Kitchen ipratropium-albuterol (DUONEB) 0.5-2.5 (3) MG/3ML SOLN Take 3 mLs by nebulization every 6 (six) hours as needed. For wheezing/shortness of breath    . lidocaine-prilocaine (EMLA) cream Apply 1 application topically as needed. Apply small amount to port site at least 1 hour prior to it being accessed, cover with plastic wrap 30 g 1  . losartan (COZAAR) 100 MG tablet Take 100 mg by mouth at bedtime.    . meloxicam (MOBIC) 15 MG tablet Take 15 mg by mouth daily with breakfast.    . montelukast (SINGULAIR) 10 MG tablet Take 10 mg by mouth at bedtime.    . ondansetron (ZOFRAN) 8 MG tablet Take 1 tablet (8 mg total) by mouth 2 (two) times daily as needed for refractory nausea / vomiting. 60 tablet 2  . prochlorperazine (COMPAZINE) 10 MG tablet Take 1 tablet (10 mg total) by mouth every 6 (six) hours as needed (Nausea or vomiting). 60 tablet 2  . triamcinolone cream (KENALOG) 0.5 % Apply 1  application topically 3 (three) times daily as needed. For inflammation    . Ascorbic Acid (VITAMIN C) 1000 MG tablet Take 1,000 mg by mouth daily.    . Calcium Carb-Cholecalciferol (CALTRATE 600+D) 600-800 MG-UNIT TABS Take 1 tablet by mouth daily.    . Cholecalciferol (VITAMIN D3) 2000 units capsule Take 2,000 Units by mouth daily.    . Cyanocobalamin (RA VITAMIN B12) 2000 MCG TBCR Take 2,000 mcg by mouth daily.    . DHA-EPA-Flaxseed Oil-Vitamin E (THERA TEARS NUTRITION PO) Take 2 tablets by mouth daily.    . Glucosamine-MSM-Hyaluronic Acd (JOINT HEALTH PO) Take 1 tablet by mouth daily.    . meclizine (ANTIVERT) 25 MG tablet Take 25 mg by mouth 3 (three) times daily as needed (for vertigo).  No current facility-administered medications for this visit.     OBJECTIVE: Vitals:   07/13/17 1003  BP: 127/74  Pulse: 85  Resp: 18  Temp: 98 F (36.7 C)     Body mass index is 18.39 kg/m.    ECOG FS:1 - Symptomatic but completely ambulatory  General: Well-developed, well-nourished, no acute distress. Eyes: Pink conjunctiva, anicteric sclera. Lungs: Clear to auscultation bilaterally. Heart: Regular rate and rhythm. No rubs, murmurs, or gallops. Abdomen: Soft, mildly distended. No organomegaly noted, normoactive bowel sounds. Musculoskeletal: No edema, cyanosis, or clubbing. Neuro: Alert, answering all questions appropriately. Cranial nerves grossly intact. Skin: No rashes or petechiae noted. Psych: Normal affect.     LAB RESULTS:  Lab Results  Component Value Date   NA 136 07/13/2017   K 4.6 07/13/2017   CL 104 07/13/2017   CO2 24 07/13/2017   GLUCOSE 90 07/13/2017   BUN 41 (H) 07/13/2017   CREATININE 1.02 (H) 07/13/2017   CALCIUM 8.5 (L) 07/13/2017   PROT 6.3 (L) 05/19/2017   ALBUMIN 2.6 (L) 05/19/2017   AST 29 05/19/2017   ALT 13 (L) 05/19/2017   ALKPHOS 77 05/19/2017   BILITOT 0.5 05/19/2017   GFRNONAA 48 (L) 07/13/2017   GFRAA 56 (L) 07/13/2017    Lab Results    Component Value Date   WBC 2.0 (L) 07/13/2017   NEUTROABS 1.1 (L) 07/13/2017   HGB 7.8 (L) 07/13/2017   HCT 23.4 (L) 07/13/2017   MCV 96.7 07/13/2017   PLT 255 07/13/2017     STUDIES: No results found.  ASSESSMENT: Stage IIIC ovarian cancer.  PLAN:    1. Stage IIIC ovarian cancer: Given patient's advanced age she is not a surgical candidate. CT scan results reviewed independently confirming stage of disease. After lengthy discussion with the patient, she wishes to continue with palliative chemotherapy using single agent carboplatinum. Although, combination therapy is recommended, given patient's age and performance status, the toxicity was thought to be too high.  Delay cycle 3 of single agent carboplatinum today compared to neutropenia.  Return to clinic in 1 week for further evaluation and reconsideration of cycle 3.  Will add Neulasta with the remainder of her treatments.  Plan to reimage at the conclusion of cycle 4. 2. Lupus anticoagulant: Patient was noted to have an elevated PTT as well as increased bleeding during her surgery for rectal prolapse. Continue to monitor closely and repeat lupus anticoagulant panel in 10-12 weeks. 3. Anemia: Patient's hemoglobin has trended down slightly.  She received 2 doses of IV Feraheme in October 2018.  4. Dizziness: Patient does not complain of this today.  Likely secondary to poor PO intake and mild dehydration. Encouraged improved water consumption. 5.  Neutropenia: Delay treatment with as above and add Neulasta with remainder of treatments.  Patient expressed understanding and was in agreement with this plan. She also understands that She can call clinic at any time with any questions, concerns, or complaints.   Cancer Staging Malignant neoplasm of ovary [facility]) Staging form: Ovary, Fallopian Tube, and Primary Peritoneal Carcinoma, AJCC 8th Edition - Clinical stage from 05/29/2017: Stage IIIC (cT3c, cN1b, cM0) - Signed by Lloyd Huger,  MD on 05/29/2017   Lloyd Huger, MD   07/13/2017 10:35 AM

## 2017-07-13 ENCOUNTER — Inpatient Hospital Stay: Payer: Medicare Other

## 2017-07-13 ENCOUNTER — Inpatient Hospital Stay (HOSPITAL_BASED_OUTPATIENT_CLINIC_OR_DEPARTMENT_OTHER): Payer: Medicare Other | Admitting: Oncology

## 2017-07-13 VITALS — BP 127/74 | HR 85 | Temp 98.0°F | Resp 18 | Wt 85.0 lb

## 2017-07-13 DIAGNOSIS — M3500 Sicca syndrome, unspecified: Secondary | ICD-10-CM

## 2017-07-13 DIAGNOSIS — Z800 Family history of malignant neoplasm of digestive organs: Secondary | ICD-10-CM | POA: Diagnosis not present

## 2017-07-13 DIAGNOSIS — Z808 Family history of malignant neoplasm of other organs or systems: Secondary | ICD-10-CM

## 2017-07-13 DIAGNOSIS — C786 Secondary malignant neoplasm of retroperitoneum and peritoneum: Secondary | ICD-10-CM | POA: Diagnosis not present

## 2017-07-13 DIAGNOSIS — Z79899 Other long term (current) drug therapy: Secondary | ICD-10-CM | POA: Diagnosis not present

## 2017-07-13 DIAGNOSIS — D701 Agranulocytosis secondary to cancer chemotherapy: Secondary | ICD-10-CM

## 2017-07-13 DIAGNOSIS — T451X5S Adverse effect of antineoplastic and immunosuppressive drugs, sequela: Secondary | ICD-10-CM

## 2017-07-13 DIAGNOSIS — Z801 Family history of malignant neoplasm of trachea, bronchus and lung: Secondary | ICD-10-CM

## 2017-07-13 DIAGNOSIS — D649 Anemia, unspecified: Secondary | ICD-10-CM

## 2017-07-13 DIAGNOSIS — M1990 Unspecified osteoarthritis, unspecified site: Secondary | ICD-10-CM | POA: Diagnosis not present

## 2017-07-13 DIAGNOSIS — I10 Essential (primary) hypertension: Secondary | ICD-10-CM | POA: Diagnosis not present

## 2017-07-13 DIAGNOSIS — K219 Gastro-esophageal reflux disease without esophagitis: Secondary | ICD-10-CM | POA: Diagnosis not present

## 2017-07-13 DIAGNOSIS — R791 Abnormal coagulation profile: Secondary | ICD-10-CM | POA: Diagnosis not present

## 2017-07-13 DIAGNOSIS — C569 Malignant neoplasm of unspecified ovary: Secondary | ICD-10-CM

## 2017-07-13 DIAGNOSIS — Z95828 Presence of other vascular implants and grafts: Secondary | ICD-10-CM

## 2017-07-13 LAB — CBC WITH DIFFERENTIAL/PLATELET
Basophils Absolute: 0 10*3/uL (ref 0–0.1)
Basophils Relative: 0 %
Eosinophils Absolute: 0 10*3/uL (ref 0–0.7)
Eosinophils Relative: 1 %
HCT: 23.4 % — ABNORMAL LOW (ref 35.0–47.0)
Hemoglobin: 7.8 g/dL — ABNORMAL LOW (ref 12.0–16.0)
Lymphocytes Relative: 32 %
Lymphs Abs: 0.6 10*3/uL — ABNORMAL LOW (ref 1.0–3.6)
MCH: 32.4 pg (ref 26.0–34.0)
MCHC: 33.5 g/dL (ref 32.0–36.0)
MCV: 96.7 fL (ref 80.0–100.0)
Monocytes Absolute: 0.2 10*3/uL (ref 0.2–0.9)
Monocytes Relative: 12 %
Neutro Abs: 1.1 10*3/uL — ABNORMAL LOW (ref 1.4–6.5)
Neutrophils Relative %: 55 %
Platelets: 255 10*3/uL (ref 150–440)
RBC: 2.42 MIL/uL — ABNORMAL LOW (ref 3.80–5.20)
RDW: 22.1 % — ABNORMAL HIGH (ref 11.5–14.5)
WBC: 2 10*3/uL — ABNORMAL LOW (ref 3.6–11.0)

## 2017-07-13 LAB — BASIC METABOLIC PANEL
Anion gap: 8 (ref 5–15)
BUN: 41 mg/dL — ABNORMAL HIGH (ref 6–20)
CO2: 24 mmol/L (ref 22–32)
Calcium: 8.5 mg/dL — ABNORMAL LOW (ref 8.9–10.3)
Chloride: 104 mmol/L (ref 101–111)
Creatinine, Ser: 1.02 mg/dL — ABNORMAL HIGH (ref 0.44–1.00)
GFR calc Af Amer: 56 mL/min — ABNORMAL LOW (ref >60)
GFR calc non Af Amer: 48 mL/min — ABNORMAL LOW (ref >60)
Glucose, Bld: 90 mg/dL (ref 65–99)
Potassium: 4.6 mmol/L (ref 3.5–5.1)
Sodium: 136 mmol/L (ref 135–145)

## 2017-07-13 MED ORDER — HEPARIN SOD (PORK) LOCK FLUSH 100 UNIT/ML IV SOLN
500.0000 [IU] | Freq: Once | INTRAVENOUS | Status: AC
  Administered 2017-07-13: 500 [IU] via INTRAVENOUS

## 2017-07-13 NOTE — Progress Notes (Signed)
Pt in today for follow up chemo.  Pt  reports doing well, no concerns.  Has had some diarrhea several days ago but resolved.

## 2017-07-14 LAB — CA 125: Cancer Antigen (CA) 125: 31.5 U/mL (ref 0.0–38.1)

## 2017-07-18 NOTE — Progress Notes (Signed)
[place]  [phone]) [phone] [phone]) [phone]  ID: Tamara Mckinney OB: 1928/08/02  MR#: [id number]  CZY#:[id number]  Patient Care Team: [name], MD as PCP - General (Family Medicine) [name], RN as Registered Nurse  CHIEF COMPLAINT: Stage IIIC ovarian cancer.  INTERVAL HISTORY: Patient returns to clinic today for further evaluation and reconsideration of cycle 3 of single agent carboplatinum. She continues to feel well and is asymptomatic.  She has no neurologic complaints.  She does not complain of abdominal pain or bloating. She denies any fevers. She denies any chest pain or shortness of breath. She has no nausea, vomiting, constipation, or diarrhea. She has no urinary complaints. Patient offers no specific complaints today.  REVIEW OF SYSTEMS:   Review of Systems  Constitutional: Negative.  Negative for fever, malaise/fatigue and weight loss.  Respiratory: Negative.  Negative for cough and shortness of breath.   Cardiovascular: Negative.  Negative for chest pain and leg swelling.  Gastrointestinal: Negative for abdominal pain, blood in stool, constipation, diarrhea, heartburn, melena, nausea and vomiting.  Genitourinary: Negative.   Musculoskeletal: Negative.   Skin: Negative.  Negative for rash.  Neurological: Negative.  Negative for dizziness and weakness.  Endo/Heme/Allergies: Does not bruise/bleed easily.  Psychiatric/Behavioral: Negative.  The patient is not nervous/anxious.     As per HPI. Otherwise, a complete review of systems is negative.  PAST MEDICAL HISTORY: Past Medical History:  Diagnosis Date  . Anemia   . Arthritis   . Asthma   . Cancer ([place])   . Dyspnea   . GERD (gastroesophageal reflux disease)   . Hypertension   . Sjogren's disease ([place])     PAST SURGICAL HISTORY: Past Surgical History:  Procedure Laterality Date  . ABDOMINAL HYSTERECTOMY    . BREAST BIOPSY     x6  . BUNIONECTOMY Bilateral   .  CATARACT EXTRACTION, BILATERAL    . FLEXIBLE SIGMOIDOSCOPY N/A 05/21/2017   Procedure: FLEXIBLE SIGMOIDOSCOPY;  Surgeon: Lin Landsman, MD;  Location: [facility] ENDOSCOPY;  Service: Gastroenterology;  Laterality: N/A;  . INCONTINENCE SURGERY    . PORTA CATH INSERTION N/A 06/16/2017   Procedure: PORTA CATH INSERTION;  Surgeon: Algernon Huxley, MD;  Location: [place] CV LAB;  Service: Cardiovascular;  Laterality: N/A;  . REPAIR OF RECTAL PROLAPSE N/A 05/11/2017   Procedure: REPAIR OF RECTAL PROLAPSE;  Surgeon: Leonie Green, MD;  Location: [facility] ORS;  Service: General;  Laterality: N/A;  . TONSILLECTOMY    . VAGINA SURGERY     uncertain procedure performed    FAMILY HISTORY: Family History  Problem Relation Age of Onset  . Heart disease Mother   . Heart disease Father   . Heart disease Sister   . Heart attack Sister   . Ulcerative colitis Brother   . Lung cancer Brother   . Thyroid cancer Sister   . Asthma Sister   . Diabetes Sister   . Asthma Sister   . Pancreatic cancer Sister   . Dementia Sister   . Asthma Brother   . Heart disease Brother   . Asthma Brother   . Lung cancer Brother   . Asthma Brother   . Lung cancer Brother   . Lung cancer Brother   . Rheum arthritis Brother     ADVANCED DIRECTIVES (Y/N):  N  HEALTH MAINTENANCE: Social History   Tobacco Use  . Smoking status: Never Smoker  . Smokeless tobacco: Never Used  Substance Use Topics  . Alcohol use:  No  . Drug use: No     Colonoscopy:  PAP:  Bone density:  Lipid panel:  No Known Allergies  Current Outpatient Medications  Medication Sig Dispense Refill  . acetaminophen (TYLENOL) 500 MG tablet Take 500 mg by mouth 2 (two) times daily as needed for moderate pain.     Marland Kitchen albuterol (PROVENTIL HFA;VENTOLIN HFA) 108 (90 Base) MCG/ACT inhaler Inhale 1-2 puffs into the lungs every 6 (six) hours as needed for wheezing or shortness of breath.    Marland Kitchen amLODipine (NORVASC) 5 MG tablet Take 5 mg by mouth  daily with breakfast.    . Carboxymeth-Glyc-Polysorb PF (REFRESH OPTIVE ADVANCED PF) 0.5-1-0.5 % SOLN Place 1-2 drops into both eyes 3 (three) times daily as needed. For dry, irritated eyes    . cetirizine (ZYRTEC) 10 MG tablet Take 10 mg by mouth at bedtime as needed for allergies.    . cyclobenzaprine (FLEXERIL) 10 MG tablet Take 1 tablet (10 mg total) by mouth at bedtime. 30 tablet 2  . docusate sodium (COLACE) 100 MG capsule Take 100-300 mg by mouth at bedtime as needed for mild constipation.    Marland Kitchen esomeprazole (NEXIUM) 40 MG capsule Take 40 mg by mouth daily before breakfast.     . fluticasone furoate-vilanterol (BREO ELLIPTA) 100-25 MCG/INH AEPB Inhale 1 puff into the lungs at bedtime.    . hypromellose (SYSTANE OVERNIGHT THERAPY) 0.3 % GEL ophthalmic ointment Place 1 application into both eyes at bedtime.    Marland Kitchen ipratropium-albuterol (DUONEB) 0.5-2.5 (3) MG/3ML SOLN Take 3 mLs by nebulization every 6 (six) hours as needed. For wheezing/shortness of breath    . lidocaine-prilocaine (EMLA) cream Apply 1 application topically as needed. Apply small amount to port site at least 1 hour prior to it being accessed, cover with plastic wrap 30 g 1  . losartan (COZAAR) 100 MG tablet Take 100 mg by mouth at bedtime.    . meclizine (ANTIVERT) 25 MG tablet Take 25 mg by mouth 3 (three) times daily as needed (for vertigo).     . meloxicam (MOBIC) 15 MG tablet Take 15 mg by mouth daily with breakfast.    . montelukast (SINGULAIR) 10 MG tablet Take 10 mg by mouth at bedtime.    . triamcinolone cream (KENALOG) 0.5 % Apply 1 application topically 3 (three) times daily as needed. For inflammation    . Ascorbic Acid (VITAMIN C) 1000 MG tablet Take 1,000 mg by mouth daily.    . Calcium Carb-Cholecalciferol (CALTRATE 600+D) 600-800 MG-UNIT TABS Take 1 tablet by mouth daily.    . Carboxymethylcellulose Sod PF (REFRESH CELLUVISC) 1 % GEL Place 1 drop into both eyes at bedtime. In the middle of the night     .  Cholecalciferol (VITAMIN D3) 2000 units capsule Take 2,000 Units by mouth daily.    . Cyanocobalamin (RA VITAMIN B12) 2000 MCG TBCR Take 2,000 mcg by mouth daily.    . DHA-EPA-Flaxseed Oil-Vitamin E (THERA TEARS NUTRITION PO) Take 2 tablets by mouth daily.    . furosemide (LASIX) 40 MG tablet Take 40 mg by mouth daily with breakfast.     . Glucosamine-MSM-Hyaluronic Acd (JOINT HEALTH PO) Take 1 tablet by mouth daily.    . ondansetron (ZOFRAN) 8 MG tablet Take 1 tablet (8 mg total) by mouth 2 (two) times daily as needed for refractory nausea / vomiting. (Patient not taking: Reported on 07/20/2017) 60 tablet 2  . prochlorperazine (COMPAZINE) 10 MG tablet Take 1 tablet (10 mg total) by mouth  every 6 (six) hours as needed (Nausea or vomiting). (Patient not taking: Reported on 07/20/2017) 60 tablet 2   No current facility-administered medications for this visit.    Facility-Administered Medications Ordered in Other Visits  Medication Dose Route Frequency Provider Last Rate Last Dose  . CARBOplatin (PARAPLATIN) 240 mg in sodium chloride 0.9 % 250 mL chemo infusion  240 mg Intravenous Once Lloyd Huger, MD      . heparin lock flush 100 unit/mL  500 Units Intracatheter Once PRN Lloyd Huger, MD      . pegfilgrastim (NEULASTA ONPRO KIT) injection 6 mg  6 mg Subcutaneous Once Lloyd Huger, MD        OBJECTIVE: Vitals:   07/20/17 0949  BP: 130/73  Pulse: 86  Resp: 18  Temp: (!) 97.5 F (36.4 C)     Body mass index is 19.5 kg/m.    ECOG FS:1 - Symptomatic but completely ambulatory  General: Well-developed, well-nourished, no acute distress. Eyes: Pink conjunctiva, anicteric sclera. Lungs: Clear to auscultation bilaterally. Heart: Regular rate and rhythm. No rubs, murmurs, or gallops. Abdomen: Soft, mildly distended. No organomegaly noted, normoactive bowel sounds. Musculoskeletal: No edema, cyanosis, or clubbing. Neuro: Alert, answering all questions appropriately. Cranial  nerves grossly intact. Skin: No rashes or petechiae noted. Psych: Normal affect.     LAB RESULTS:  Lab Results  Component Value Date   NA 137 07/20/2017   K 4.1 07/20/2017   CL 107 07/20/2017   CO2 23 07/20/2017   GLUCOSE 105 (H) 07/20/2017   BUN 52 (H) 07/20/2017   CREATININE 1.01 (H) 07/20/2017   CALCIUM 8.8 (L) 07/20/2017   PROT 6.3 (L) 05/19/2017   ALBUMIN 2.6 (L) 05/19/2017   AST 29 05/19/2017   ALT 13 (L) 05/19/2017   ALKPHOS 77 05/19/2017   BILITOT 0.5 05/19/2017   GFRNONAA 49 (L) 07/20/2017   GFRAA 56 (L) 07/20/2017    Lab Results  Component Value Date   WBC 2.3 (L) 07/20/2017   NEUTROABS 1.3 (L) 07/20/2017   HGB 8.5 (L) 07/20/2017   HCT 24.9 (L) 07/20/2017   MCV 98.0 07/20/2017   PLT 394 07/20/2017     STUDIES: No results found.  ASSESSMENT: Stage IIIC ovarian cancer.  PLAN:    1. Stage IIIC ovarian cancer: Given patient's advanced age she is not a surgical candidate. CT scan results reviewed independently confirming stage of disease. After lengthy discussion with the patient, she wishes to continue with palliative chemotherapy using single agent carboplatinum. Although, combination therapy is recommended, given patient's age and performance status, the toxicity was thought to be too high.  Proceed with cycle 3 of single agent carboplatinum today.  Patient will also get Neulasta with remainder of her treatments. Return to clinic in 3 weeks for further evaluation and reconsideration of cycle 4 of 6. Plan to reimage at the conclusion of cycle 4. 2. Lupus anticoagulant: Patient was noted to have an elevated PTT as well as increased bleeding during her surgery for rectal prolapse. Continue to monitor closely and repeat lupus anticoagulant panel in 10-12 weeks. 3. Anemia: Patient's hemoglobin has trended down slightly.  She received 2 doses of IV Feraheme in October 2018.  4. Dizziness: Patient does not complain of this today.  Likely secondary to poor PO intake  and mild dehydration. Encouraged improved water consumption. 5.  Neutropenia: Add Neulasta with remainder of treatments.  Patient expressed understanding and was in agreement with this plan. She also understands that She can call  clinic at any time with any questions, concerns, or complaints.   Cancer Staging Malignant neoplasm of ovary [facility]) Staging form: Ovary, Fallopian Tube, and Primary Peritoneal Carcinoma, AJCC 8th Edition - Clinical stage from 05/29/2017: Stage IIIC (cT3c, cN1b, cM0) - Signed by Lloyd Huger, MD on 05/29/2017   Lloyd Huger, MD   07/20/2017 11:19 AM

## 2017-07-20 ENCOUNTER — Inpatient Hospital Stay: Payer: Medicare Other

## 2017-07-20 ENCOUNTER — Inpatient Hospital Stay: Payer: Medicare Other | Attending: Oncology | Admitting: Oncology

## 2017-07-20 VITALS — BP 130/73 | HR 86 | Temp 97.5°F | Resp 18 | Wt 90.1 lb

## 2017-07-20 DIAGNOSIS — D649 Anemia, unspecified: Secondary | ICD-10-CM | POA: Insufficient documentation

## 2017-07-20 DIAGNOSIS — N189 Chronic kidney disease, unspecified: Secondary | ICD-10-CM | POA: Diagnosis not present

## 2017-07-20 DIAGNOSIS — Z5111 Encounter for antineoplastic chemotherapy: Secondary | ICD-10-CM | POA: Diagnosis not present

## 2017-07-20 DIAGNOSIS — Z801 Family history of malignant neoplasm of trachea, bronchus and lung: Secondary | ICD-10-CM | POA: Insufficient documentation

## 2017-07-20 DIAGNOSIS — D6862 Lupus anticoagulant syndrome: Secondary | ICD-10-CM | POA: Diagnosis not present

## 2017-07-20 DIAGNOSIS — I10 Essential (primary) hypertension: Secondary | ICD-10-CM | POA: Diagnosis not present

## 2017-07-20 DIAGNOSIS — Z808 Family history of malignant neoplasm of other organs or systems: Secondary | ICD-10-CM | POA: Insufficient documentation

## 2017-07-20 DIAGNOSIS — R42 Dizziness and giddiness: Secondary | ICD-10-CM | POA: Insufficient documentation

## 2017-07-20 DIAGNOSIS — C569 Malignant neoplasm of unspecified ovary: Secondary | ICD-10-CM | POA: Insufficient documentation

## 2017-07-20 DIAGNOSIS — I129 Hypertensive chronic kidney disease with stage 1 through stage 4 chronic kidney disease, or unspecified chronic kidney disease: Secondary | ICD-10-CM | POA: Insufficient documentation

## 2017-07-20 DIAGNOSIS — R791 Abnormal coagulation profile: Secondary | ICD-10-CM

## 2017-07-20 DIAGNOSIS — M1990 Unspecified osteoarthritis, unspecified site: Secondary | ICD-10-CM | POA: Diagnosis not present

## 2017-07-20 DIAGNOSIS — K219 Gastro-esophageal reflux disease without esophagitis: Secondary | ICD-10-CM | POA: Diagnosis not present

## 2017-07-20 DIAGNOSIS — T451X5S Adverse effect of antineoplastic and immunosuppressive drugs, sequela: Secondary | ICD-10-CM | POA: Diagnosis not present

## 2017-07-20 DIAGNOSIS — Z79899 Other long term (current) drug therapy: Secondary | ICD-10-CM | POA: Insufficient documentation

## 2017-07-20 DIAGNOSIS — Z800 Family history of malignant neoplasm of digestive organs: Secondary | ICD-10-CM | POA: Diagnosis not present

## 2017-07-20 DIAGNOSIS — M3500 Sicca syndrome, unspecified: Secondary | ICD-10-CM | POA: Insufficient documentation

## 2017-07-20 DIAGNOSIS — D701 Agranulocytosis secondary to cancer chemotherapy: Secondary | ICD-10-CM

## 2017-07-20 LAB — CBC WITH DIFFERENTIAL/PLATELET
Basophils Absolute: 0 10*3/uL (ref 0–0.1)
Basophils Relative: 0 %
Eosinophils Absolute: 0 10*3/uL (ref 0–0.7)
Eosinophils Relative: 1 %
HCT: 24.9 % — ABNORMAL LOW (ref 35.0–47.0)
Hemoglobin: 8.5 g/dL — ABNORMAL LOW (ref 12.0–16.0)
Lymphocytes Relative: 31 %
Lymphs Abs: 0.7 10*3/uL — ABNORMAL LOW (ref 1.0–3.6)
MCH: 33.5 pg (ref 26.0–34.0)
MCHC: 34.2 g/dL (ref 32.0–36.0)
MCV: 98 fL (ref 80.0–100.0)
Monocytes Absolute: 0.3 10*3/uL (ref 0.2–0.9)
Monocytes Relative: 12 %
Neutro Abs: 1.3 10*3/uL — ABNORMAL LOW (ref 1.4–6.5)
Neutrophils Relative %: 56 %
Platelets: 394 10*3/uL (ref 150–440)
RBC: 2.54 MIL/uL — ABNORMAL LOW (ref 3.80–5.20)
RDW: 23 % — ABNORMAL HIGH (ref 11.5–14.5)
WBC: 2.3 10*3/uL — ABNORMAL LOW (ref 3.6–11.0)

## 2017-07-20 LAB — BASIC METABOLIC PANEL
Anion gap: 7 (ref 5–15)
BUN: 52 mg/dL — ABNORMAL HIGH (ref 6–20)
CO2: 23 mmol/L (ref 22–32)
Calcium: 8.8 mg/dL — ABNORMAL LOW (ref 8.9–10.3)
Chloride: 107 mmol/L (ref 101–111)
Creatinine, Ser: 1.01 mg/dL — ABNORMAL HIGH (ref 0.44–1.00)
GFR calc Af Amer: 56 mL/min — ABNORMAL LOW (ref >60)
GFR calc non Af Amer: 49 mL/min — ABNORMAL LOW (ref >60)
Glucose, Bld: 105 mg/dL — ABNORMAL HIGH (ref 65–99)
Potassium: 4.1 mmol/L (ref 3.5–5.1)
Sodium: 137 mmol/L (ref 135–145)

## 2017-07-20 MED ORDER — SODIUM CHLORIDE 0.9 % IV SOLN
240.0000 mg | Freq: Once | INTRAVENOUS | Status: AC
  Administered 2017-07-20: 240 mg via INTRAVENOUS
  Filled 2017-07-20: qty 24

## 2017-07-20 MED ORDER — PALONOSETRON HCL INJECTION 0.25 MG/5ML
0.2500 mg | Freq: Once | INTRAVENOUS | Status: AC
  Administered 2017-07-20: 0.25 mg via INTRAVENOUS
  Filled 2017-07-20: qty 5

## 2017-07-20 MED ORDER — SODIUM CHLORIDE 0.9 % IV SOLN
Freq: Once | INTRAVENOUS | Status: AC
  Administered 2017-07-20: 11:00:00 via INTRAVENOUS
  Filled 2017-07-20: qty 5

## 2017-07-20 MED ORDER — SODIUM CHLORIDE 0.9 % IV SOLN
Freq: Once | INTRAVENOUS | Status: AC
  Administered 2017-07-20: 11:00:00 via INTRAVENOUS
  Filled 2017-07-20: qty 1000

## 2017-07-20 MED ORDER — HEPARIN SOD (PORK) LOCK FLUSH 100 UNIT/ML IV SOLN
500.0000 [IU] | Freq: Once | INTRAVENOUS | Status: AC | PRN
Start: 2017-07-20 — End: 2017-07-20
  Administered 2017-07-20: 500 [IU]
  Filled 2017-07-20: qty 5

## 2017-07-20 MED ORDER — PEGFILGRASTIM 6 MG/0.6ML ~~LOC~~ PSKT
6.0000 mg | PREFILLED_SYRINGE | Freq: Once | SUBCUTANEOUS | Status: AC
  Administered 2017-07-20: 6 mg via SUBCUTANEOUS
  Filled 2017-07-20: qty 0.6

## 2017-07-21 LAB — CA 125: Cancer Antigen (CA) 125: 25.4 U/mL (ref 0.0–38.1)

## 2017-08-08 ENCOUNTER — Other Ambulatory Visit: Payer: Self-pay | Admitting: Oncology

## 2017-08-10 NOTE — Progress Notes (Signed)
[name]  Telephone:(336) [phone] [phone]) [phone]  ID: Tamara Mckinney OB: 1927/10/05  MR#: [id number]  OEH#:[id number]  Patient Care Team: [name], MD as PCP - General (Family Medicine) [name], RN as Registered Nurse  CHIEF COMPLAINT: Stage IIIC ovarian cancer.  INTERVAL HISTORY: Patient returns to clinic today for further evaluation and consideration of cycle 4 of single agent carboplatinum. She continues to feel well and is asymptomatic.  She has no neurologic complaints.  She does not complain of abdominal pain or bloating. She denies any fevers. She denies any chest pain or shortness of breath. She has no nausea, vomiting, constipation, or diarrhea. She has no urinary complaints. Patient offers no specific complaints today.  REVIEW OF SYSTEMS:   Review of Systems  Constitutional: Negative.  Negative for fever, malaise/fatigue and weight loss.  Respiratory: Negative.  Negative for cough and shortness of breath.   Cardiovascular: Negative.  Negative for chest pain and leg swelling.  Gastrointestinal: Negative for abdominal pain, blood in stool, constipation, diarrhea, heartburn, melena, nausea and vomiting.  Genitourinary: Negative.   Musculoskeletal: Negative.   Skin: Negative.  Negative for rash.  Neurological: Negative.  Negative for dizziness and weakness.  Endo/Heme/Allergies: Does not bruise/bleed easily.  Psychiatric/Behavioral: Negative.  The patient is not nervous/anxious.     As per HPI. Otherwise, a complete review of systems is negative.  PAST MEDICAL HISTORY: Past Medical History:  Diagnosis Date  . Anemia   . Arthritis   . Asthma   . Cancer ([place])   . Dyspnea   . GERD (gastroesophageal reflux disease)   . Hypertension   . Sjogren's disease ([place])     PAST SURGICAL HISTORY: Past Surgical History:  Procedure Laterality Date  . ABDOMINAL HYSTERECTOMY    . BREAST BIOPSY     x6  . BUNIONECTOMY Bilateral   .  CATARACT EXTRACTION, BILATERAL    . FLEXIBLE SIGMOIDOSCOPY N/A 05/21/2017   Procedure: FLEXIBLE SIGMOIDOSCOPY;  Surgeon: Lin Landsman, MD;  Location: [facility] ENDOSCOPY;  Service: Gastroenterology;  Laterality: N/A;  . INCONTINENCE SURGERY    . PORTA CATH INSERTION N/A 06/16/2017   Procedure: PORTA CATH INSERTION;  Surgeon: Algernon Huxley, MD;  Location: [place] CV LAB;  Service: Cardiovascular;  Laterality: N/A;  . REPAIR OF RECTAL PROLAPSE N/A 05/11/2017   Procedure: REPAIR OF RECTAL PROLAPSE;  Surgeon: Leonie Green, MD;  Location: [facility] ORS;  Service: General;  Laterality: N/A;  . TONSILLECTOMY    . VAGINA SURGERY     uncertain procedure performed    FAMILY HISTORY: Family History  Problem Relation Age of Onset  . Heart disease Mother   . Heart disease Father   . Heart disease Sister   . Heart attack Sister   . Ulcerative colitis Brother   . Lung cancer Brother   . Thyroid cancer Sister   . Asthma Sister   . Diabetes Sister   . Asthma Sister   . Pancreatic cancer Sister   . Dementia Sister   . Asthma Brother   . Heart disease Brother   . Asthma Brother   . Lung cancer Brother   . Asthma Brother   . Lung cancer Brother   . Lung cancer Brother   . Rheum arthritis Brother     ADVANCED DIRECTIVES (Y/N):  N  HEALTH MAINTENANCE: Social History   Tobacco Use  . Smoking status: Never Smoker  . Smokeless tobacco: Never Used  Substance Use Topics  . Alcohol use:  No  . Drug use: No     Colonoscopy:  PAP:  Bone density:  Lipid panel:  No Known Allergies  Current Outpatient Medications  Medication Sig Dispense Refill  . acetaminophen (TYLENOL) 500 MG tablet Take 500 mg by mouth 2 (two) times daily as needed for moderate pain.     Marland Kitchen albuterol (PROVENTIL HFA;VENTOLIN HFA) 108 (90 Base) MCG/ACT inhaler Inhale 1-2 puffs into the lungs every 6 (six) hours as needed for wheezing or shortness of breath.    Marland Kitchen amLODipine (NORVASC) 5 MG tablet Take 5 mg by mouth  daily with breakfast.    . Ascorbic Acid (VITAMIN C) 1000 MG tablet Take 1,000 mg by mouth daily.    . Calcium Carb-Cholecalciferol (CALTRATE 600+D) 600-800 MG-UNIT TABS Take 1 tablet by mouth daily.    . Carboxymeth-Glyc-Polysorb PF (REFRESH OPTIVE ADVANCED PF) 0.5-1-0.5 % SOLN Place 1-2 drops into both eyes 3 (three) times daily as needed. For dry, irritated eyes    . Carboxymethylcellulose Sod PF (REFRESH CELLUVISC) 1 % GEL Place 1 drop into both eyes at bedtime. In the middle of the night     . cetirizine (ZYRTEC) 10 MG tablet Take 10 mg by mouth at bedtime as needed for allergies.    . Cholecalciferol (VITAMIN D3) 2000 units capsule Take 2,000 Units by mouth daily.    Marland Kitchen docusate sodium (COLACE) 100 MG capsule Take 100-300 mg by mouth at bedtime as needed for mild constipation.    Marland Kitchen esomeprazole (NEXIUM) 40 MG capsule Take 40 mg by mouth daily before breakfast.     . fluticasone furoate-vilanterol (BREO ELLIPTA) 100-25 MCG/INH AEPB Inhale 1 puff into the lungs at bedtime.    . furosemide (LASIX) 40 MG tablet Take 40 mg by mouth daily with breakfast.     . hypromellose (SYSTANE OVERNIGHT THERAPY) 0.3 % GEL ophthalmic ointment Place 1 application into both eyes at bedtime.    Marland Kitchen ipratropium-albuterol (DUONEB) 0.5-2.5 (3) MG/3ML SOLN Take 3 mLs by nebulization every 6 (six) hours as needed. For wheezing/shortness of breath    . lidocaine-prilocaine (EMLA) cream Apply 1 application topically as needed. Apply small amount to port site at least 1 hour prior to it being accessed, cover with plastic wrap 30 g 1  . losartan (COZAAR) 100 MG tablet Take 100 mg by mouth at bedtime.    . meclizine (ANTIVERT) 25 MG tablet Take 25 mg by mouth 3 (three) times daily as needed (for vertigo).     . meloxicam (MOBIC) 15 MG tablet Take 15 mg by mouth daily with breakfast.    . montelukast (SINGULAIR) 10 MG tablet Take 10 mg by mouth at bedtime.    . Cyanocobalamin (RA VITAMIN B12) 2000 MCG TBCR Take 2,000 mcg by  mouth daily.    . cyclobenzaprine (FLEXERIL) 10 MG tablet Take 1 tablet (10 mg total) by mouth at bedtime. (Patient not taking: Reported on 08/11/2017) 30 tablet 2  . DHA-EPA-Flaxseed Oil-Vitamin E (THERA TEARS NUTRITION PO) Take 2 tablets by mouth daily.    . Glucosamine-MSM-Hyaluronic Acd (JOINT HEALTH PO) Take 1 tablet by mouth daily.    . ondansetron (ZOFRAN) 8 MG tablet Take 1 tablet (8 mg total) by mouth 2 (two) times daily as needed for refractory nausea / vomiting. (Patient not taking: Reported on 08/11/2017) 60 tablet 2  . prochlorperazine (COMPAZINE) 10 MG tablet Take 1 tablet (10 mg total) by mouth every 6 (six) hours as needed (Nausea or vomiting). (Patient not taking: Reported on 07/20/2017)  60 tablet 2  . triamcinolone cream (KENALOG) 0.5 % Apply 1 application topically 3 (three) times daily as needed. For inflammation     No current facility-administered medications for this visit.    Facility-Administered Medications Ordered in Other Visits  Medication Dose Route Frequency Provider Last Rate Last Dose  . CARBOplatin (PARAPLATIN) 220 mg in sodium chloride 0.9 % 250 mL chemo infusion  220 mg Intravenous Once Lloyd Huger, MD      . heparin lock flush 100 unit/mL  500 Units Intravenous Once Lloyd Huger, MD      . pegfilgrastim (NEULASTA ONPRO KIT) injection 6 mg  6 mg Subcutaneous Once Lloyd Huger, MD        OBJECTIVE: Vitals:   08/11/17 0953  BP: 133/77  Resp: 18  Temp: (!) 96.4 F (35.8 C)     Body mass index is 19.37 kg/m.    ECOG FS:1 - Symptomatic but completely ambulatory  General: Well-developed, well-nourished, no acute distress. Eyes: Pink conjunctiva, anicteric sclera. Lungs: Clear to auscultation bilaterally. Heart: Regular rate and rhythm. No rubs, murmurs, or gallops. Abdomen: Soft, mildly distended. No organomegaly noted, normoactive bowel sounds. Musculoskeletal: No edema, cyanosis, or clubbing. Neuro: Alert, answering all questions  appropriately. Cranial nerves grossly intact. Skin: No rashes or petechiae noted. Psych: Normal affect.     LAB RESULTS:  Lab Results  Component Value Date   NA 137 08/11/2017   K 4.4 08/11/2017   CL 106 08/11/2017   CO2 24 08/11/2017   GLUCOSE 106 (H) 08/11/2017   BUN 71 (H) 08/11/2017   CREATININE 1.41 (H) 08/11/2017   CALCIUM 8.7 (L) 08/11/2017   PROT 6.3 (L) 05/19/2017   ALBUMIN 2.6 (L) 05/19/2017   AST 29 05/19/2017   ALT 13 (L) 05/19/2017   ALKPHOS 77 05/19/2017   BILITOT 0.5 05/19/2017   GFRNONAA 32 (L) 08/11/2017   GFRAA 38 (L) 08/11/2017    Lab Results  Component Value Date   WBC 5.0 08/11/2017   NEUTROABS 3.9 08/11/2017   HGB 8.3 (L) 08/11/2017   HCT 24.8 (L) 08/11/2017   MCV 100.4 (H) 08/11/2017   PLT 201 08/11/2017     STUDIES: No results found.  ASSESSMENT: Stage IIIC ovarian cancer.  PLAN:    1. Stage IIIC ovarian cancer: Given patient's advanced age she is not a surgical candidate. CT scan results reviewed independently confirming stage of disease. After discussion with the patient, she wishes to continue with palliative chemotherapy using single agent carboplatinum. Although, combination therapy is recommended, given patient's age and performance status, the toxicity was thought to be too high.  Proceed with cycle 4 of single agent carboplatinum today.  Patient will also get Neulasta with remainder of her treatments. Return to clinic in 3 weeks for further evaluation and consideration of cycle 5 of 6. Plan to reimage with CT scan prior to cycle 5. 2. Lupus anticoagulant: Patient was noted to have an elevated PTT as well as increased bleeding during her surgery for rectal prolapse. Continue to monitor closely and repeat lupus anticoagulant panel in 10-12 weeks. 3. Anemia: Patient's hemoglobin has trended down slightly.  She received 2 doses of IV Feraheme in October 2018.  4. Dizziness: Patient does not complain of this today.  Likely secondary to poor  PO intake and mild dehydration. Encouraged improved water consumption. 5.  Neutropenia: Add Neulasta with remainder of treatments. 6.  Chronic renal insufficiency: Patient's creatinine is slightly worse today.  Monitor.  Patient expressed understanding  and was in agreement with this plan. She also understands that She can call clinic at any time with any questions, concerns, or complaints.   Cancer Staging Malignant neoplasm of ovary [facility]) Staging form: Ovary, Fallopian Tube, and Primary Peritoneal Carcinoma, AJCC 8th Edition - Clinical stage from 05/29/2017: Stage IIIC (cT3c, cN1b, cM0) - Signed by Lloyd Huger, MD on 05/29/2017   Lloyd Huger, MD   08/11/2017 11:50 AM

## 2017-08-11 ENCOUNTER — Inpatient Hospital Stay: Payer: Medicare Other

## 2017-08-11 ENCOUNTER — Inpatient Hospital Stay (HOSPITAL_BASED_OUTPATIENT_CLINIC_OR_DEPARTMENT_OTHER): Payer: Medicare Other | Admitting: Oncology

## 2017-08-11 VITALS — BP 133/77 | Temp 96.4°F | Resp 18 | Wt 89.5 lb

## 2017-08-11 DIAGNOSIS — D6862 Lupus anticoagulant syndrome: Secondary | ICD-10-CM

## 2017-08-11 DIAGNOSIS — M3500 Sicca syndrome, unspecified: Secondary | ICD-10-CM

## 2017-08-11 DIAGNOSIS — R791 Abnormal coagulation profile: Secondary | ICD-10-CM

## 2017-08-11 DIAGNOSIS — Z801 Family history of malignant neoplasm of trachea, bronchus and lung: Secondary | ICD-10-CM

## 2017-08-11 DIAGNOSIS — Z800 Family history of malignant neoplasm of digestive organs: Secondary | ICD-10-CM

## 2017-08-11 DIAGNOSIS — K219 Gastro-esophageal reflux disease without esophagitis: Secondary | ICD-10-CM

## 2017-08-11 DIAGNOSIS — T451X5S Adverse effect of antineoplastic and immunosuppressive drugs, sequela: Secondary | ICD-10-CM

## 2017-08-11 DIAGNOSIS — N189 Chronic kidney disease, unspecified: Secondary | ICD-10-CM

## 2017-08-11 DIAGNOSIS — D649 Anemia, unspecified: Secondary | ICD-10-CM

## 2017-08-11 DIAGNOSIS — I129 Hypertensive chronic kidney disease with stage 1 through stage 4 chronic kidney disease, or unspecified chronic kidney disease: Secondary | ICD-10-CM | POA: Diagnosis not present

## 2017-08-11 DIAGNOSIS — Z79899 Other long term (current) drug therapy: Secondary | ICD-10-CM

## 2017-08-11 DIAGNOSIS — C569 Malignant neoplasm of unspecified ovary: Secondary | ICD-10-CM | POA: Diagnosis not present

## 2017-08-11 DIAGNOSIS — M1990 Unspecified osteoarthritis, unspecified site: Secondary | ICD-10-CM | POA: Diagnosis not present

## 2017-08-11 DIAGNOSIS — D701 Agranulocytosis secondary to cancer chemotherapy: Secondary | ICD-10-CM | POA: Diagnosis not present

## 2017-08-11 DIAGNOSIS — Z808 Family history of malignant neoplasm of other organs or systems: Secondary | ICD-10-CM

## 2017-08-11 LAB — CBC WITH DIFFERENTIAL/PLATELET
Basophils Absolute: 0 10*3/uL (ref 0–0.1)
Basophils Relative: 0 %
Eosinophils Absolute: 0 10*3/uL (ref 0–0.7)
Eosinophils Relative: 0 %
HCT: 24.8 % — ABNORMAL LOW (ref 35.0–47.0)
Hemoglobin: 8.3 g/dL — ABNORMAL LOW (ref 12.0–16.0)
Lymphocytes Relative: 12 %
Lymphs Abs: 0.6 10*3/uL — ABNORMAL LOW (ref 1.0–3.6)
MCH: 33.6 pg (ref 26.0–34.0)
MCHC: 33.5 g/dL (ref 32.0–36.0)
MCV: 100.4 fL — ABNORMAL HIGH (ref 80.0–100.0)
Monocytes Absolute: 0.5 10*3/uL (ref 0.2–0.9)
Monocytes Relative: 10 %
Neutro Abs: 3.9 10*3/uL (ref 1.4–6.5)
Neutrophils Relative %: 78 %
Platelets: 201 10*3/uL (ref 150–440)
RBC: 2.47 MIL/uL — ABNORMAL LOW (ref 3.80–5.20)
RDW: 23 % — ABNORMAL HIGH (ref 11.5–14.5)
WBC: 5 10*3/uL (ref 3.6–11.0)

## 2017-08-11 LAB — BASIC METABOLIC PANEL
Anion gap: 7 (ref 5–15)
BUN: 71 mg/dL — ABNORMAL HIGH (ref 6–20)
CO2: 24 mmol/L (ref 22–32)
Calcium: 8.7 mg/dL — ABNORMAL LOW (ref 8.9–10.3)
Chloride: 106 mmol/L (ref 101–111)
Creatinine, Ser: 1.41 mg/dL — ABNORMAL HIGH (ref 0.44–1.00)
GFR calc Af Amer: 38 mL/min — ABNORMAL LOW (ref >60)
GFR calc non Af Amer: 32 mL/min — ABNORMAL LOW (ref >60)
Glucose, Bld: 106 mg/dL — ABNORMAL HIGH (ref 65–99)
Potassium: 4.4 mmol/L (ref 3.5–5.1)
Sodium: 137 mmol/L (ref 135–145)

## 2017-08-11 MED ORDER — FOSAPREPITANT DIMEGLUMINE INJECTION 150 MG
Freq: Once | INTRAVENOUS | Status: AC
  Administered 2017-08-11: 11:00:00 via INTRAVENOUS
  Filled 2017-08-11: qty 5

## 2017-08-11 MED ORDER — PEGFILGRASTIM 6 MG/0.6ML ~~LOC~~ PSKT
6.0000 mg | PREFILLED_SYRINGE | Freq: Once | SUBCUTANEOUS | Status: AC
  Administered 2017-08-11: 6 mg via SUBCUTANEOUS
  Filled 2017-08-11: qty 0.6

## 2017-08-11 MED ORDER — SODIUM CHLORIDE 0.9 % IV SOLN
223.5000 mg | Freq: Once | INTRAVENOUS | Status: AC
  Administered 2017-08-11: 220 mg via INTRAVENOUS
  Filled 2017-08-11: qty 22

## 2017-08-11 MED ORDER — HEPARIN SOD (PORK) LOCK FLUSH 100 UNIT/ML IV SOLN
500.0000 [IU] | Freq: Once | INTRAVENOUS | Status: AC
  Administered 2017-08-11: 500 [IU] via INTRAVENOUS
  Filled 2017-08-11: qty 5

## 2017-08-11 MED ORDER — SODIUM CHLORIDE 0.9% FLUSH
10.0000 mL | Freq: Once | INTRAVENOUS | Status: AC
  Administered 2017-08-11: 10 mL via INTRAVENOUS
  Filled 2017-08-11: qty 10

## 2017-08-11 MED ORDER — SODIUM CHLORIDE 0.9 % IV SOLN
Freq: Once | INTRAVENOUS | Status: AC
  Administered 2017-08-11: 11:00:00 via INTRAVENOUS
  Filled 2017-08-11: qty 1000

## 2017-08-11 MED ORDER — PALONOSETRON HCL INJECTION 0.25 MG/5ML
0.2500 mg | Freq: Once | INTRAVENOUS | Status: AC
  Administered 2017-08-11: 0.25 mg via INTRAVENOUS
  Filled 2017-08-11: qty 5

## 2017-08-11 NOTE — Progress Notes (Signed)
SCr increased fro, 1.10 to 1.41. Previous carboplatin dose was 240mg . Dose will be reduced to 220mg  due to SCr and more than 10% difference from calculated dose.

## 2017-08-12 LAB — CA 125: Cancer Antigen (CA) 125: 19.5 U/mL (ref 0.0–38.1)

## 2017-08-29 NOTE — Progress Notes (Signed)
[place]  [phone] [phone]  ID: Tamara Mckinney OB: July 07, 1928  MR#: [id number]  EQA#:[id number]  Patient Care Team: [name], MD as PCP - General (Family Medicine) [name], RN as Registered Nurse  CHIEF COMPLAINT: Stage IIIC ovarian cancer.  INTERVAL HISTORY: Patient returns to clinic today for further evaluation, discussion of her imaging results, and consideration of cycle 5 of single agent carboplatinum. She continues to feel well and is asymptomatic.  She has no neurologic complaints.  She does not complain of abdominal pain or bloating. She denies any fevers. She denies any chest pain or shortness of breath. She has no nausea, vomiting, constipation, or diarrhea. She has no urinary complaints. Patient offers no specific complaints today.  REVIEW OF SYSTEMS:   Review of Systems  Constitutional: Negative.  Negative for fever, malaise/fatigue and weight loss.  Respiratory: Negative.  Negative for cough and shortness of breath.   Cardiovascular: Negative.  Negative for chest pain and leg swelling.  Gastrointestinal: Negative for abdominal pain, blood in stool, constipation, diarrhea, heartburn, melena, nausea and vomiting.  Genitourinary: Negative.   Musculoskeletal: Negative.   Skin: Negative.  Negative for rash.  Neurological: Negative.  Negative for dizziness and weakness.  Endo/Heme/Allergies: Does not bruise/bleed easily.  Psychiatric/Behavioral: Negative.  The patient is not nervous/anxious.     As per HPI. Otherwise, a complete review of systems is negative.  PAST MEDICAL HISTORY: Past Medical History:  Diagnosis Date  . Anemia   . Arthritis   . Asthma   . Cancer ([place])   . Dyspnea   . GERD (gastroesophageal reflux disease)   . Hypertension     PAST SURGICAL HISTORY: Past Surgical History:  Procedure Laterality Date  . ABDOMINAL HYSTERECTOMY    . BREAST BIOPSY     x6  . BUNIONECTOMY Bilateral   .  CATARACT EXTRACTION, BILATERAL    . FLEXIBLE SIGMOIDOSCOPY N/A 05/21/2017   Procedure: FLEXIBLE SIGMOIDOSCOPY;  Surgeon: Lin Landsman, MD;  Location: [facility] ENDOSCOPY;  Service: Gastroenterology;  Laterality: N/A;  . INCONTINENCE SURGERY    . PORTA CATH INSERTION N/A 06/16/2017   Procedure: PORTA CATH INSERTION;  Surgeon: Algernon Huxley, MD;  Location: [place] CV LAB;  Service: Cardiovascular;  Laterality: N/A;  . REPAIR OF RECTAL PROLAPSE N/A 05/11/2017   Procedure: REPAIR OF RECTAL PROLAPSE;  Surgeon: Leonie Green, MD;  Location: [facility] ORS;  Service: General;  Laterality: N/A;  . TONSILLECTOMY    . VAGINA SURGERY     uncertain procedure performed    FAMILY HISTORY: Family History  Problem Relation Age of Onset  . Heart disease Mother   . Heart disease Father   . Heart disease Sister   . Heart attack Sister   . Ulcerative colitis Brother   . Lung cancer Brother   . Thyroid cancer Sister   . Asthma Sister   . Diabetes Sister   . Asthma Sister   . Pancreatic cancer Sister   . Dementia Sister   . Asthma Brother   . Heart disease Brother   . Asthma Brother   . Lung cancer Brother   . Asthma Brother   . Lung cancer Brother   . Lung cancer Brother   . Rheum arthritis Brother     ADVANCED DIRECTIVES (Y/N):  N  HEALTH MAINTENANCE: Social History   Tobacco Use  . Smoking status: Never Smoker  . Smokeless tobacco: Never Used  Substance Use Topics  . Alcohol use: No  .  Drug use: No     Colonoscopy:  PAP:  Bone density:  Lipid panel:  No Known Allergies  Current Outpatient Medications  Medication Sig Dispense Refill  . acetaminophen (TYLENOL) 500 MG tablet Take 500 mg by mouth 2 (two) times daily as needed for moderate pain.     Marland Kitchen albuterol (PROVENTIL HFA;VENTOLIN HFA) 108 (90 Base) MCG/ACT inhaler Inhale 1-2 puffs into the lungs every 6 (six) hours as needed for wheezing or shortness of breath.    Marland Kitchen amLODipine (NORVASC) 5 MG tablet Take 5 mg by mouth  daily with breakfast.    . Carboxymeth-Glyc-Polysorb PF (REFRESH OPTIVE ADVANCED PF) 0.5-1-0.5 % SOLN Place 1-2 drops into both eyes 3 (three) times daily as needed. For dry, irritated eyes    . Carboxymethylcellulose Sod PF (REFRESH CELLUVISC) 1 % GEL Place 1 drop into both eyes at bedtime. In the middle of the night     . cetirizine (ZYRTEC) 10 MG tablet Take 10 mg by mouth at bedtime as needed for allergies.    Marland Kitchen docusate sodium (COLACE) 100 MG capsule Take 100-300 mg by mouth at bedtime as needed for mild constipation.    Marland Kitchen esomeprazole (NEXIUM) 40 MG capsule Take 40 mg by mouth daily before breakfast.     . fluticasone furoate-vilanterol (BREO ELLIPTA) 100-25 MCG/INH AEPB Inhale 1 puff into the lungs at bedtime.    . furosemide (LASIX) 40 MG tablet Take 40 mg by mouth daily with breakfast.     . hypromellose (SYSTANE OVERNIGHT THERAPY) 0.3 % GEL ophthalmic ointment Place 1 application into both eyes at bedtime.    Marland Kitchen ipratropium-albuterol (DUONEB) 0.5-2.5 (3) MG/3ML SOLN Take 3 mLs by nebulization every 6 (six) hours as needed. For wheezing/shortness of breath    . lidocaine-prilocaine (EMLA) cream Apply 1 application topically as needed. Apply small amount to port site at least 1 hour prior to it being accessed, cover with plastic wrap 30 g 1  . losartan (COZAAR) 100 MG tablet Take 100 mg by mouth at bedtime.    . meclizine (ANTIVERT) 25 MG tablet Take 25 mg by mouth 3 (three) times daily as needed (for vertigo).     . meloxicam (MOBIC) 15 MG tablet Take 15 mg by mouth daily with breakfast.    . montelukast (SINGULAIR) 10 MG tablet Take 10 mg by mouth at bedtime.    . triamcinolone cream (KENALOG) 0.5 % Apply 1 application topically 3 (three) times daily as needed. For inflammation    . Ascorbic Acid (VITAMIN C) 1000 MG tablet Take 1,000 mg by mouth daily.    . Calcium Carb-Cholecalciferol (CALTRATE 600+D) 600-800 MG-UNIT TABS Take 1 tablet by mouth daily.    . Cholecalciferol (VITAMIN D3)  2000 units capsule Take 2,000 Units by mouth daily.    . Cyanocobalamin (RA VITAMIN B12) 2000 MCG TBCR Take 2,000 mcg by mouth daily.    . cyclobenzaprine (FLEXERIL) 10 MG tablet Take 1 tablet (10 mg total) by mouth at bedtime. (Patient not taking: Reported on 08/11/2017) 30 tablet 2  . DHA-EPA-Flaxseed Oil-Vitamin E (THERA TEARS NUTRITION PO) Take 2 tablets by mouth daily.    . Glucosamine-MSM-Hyaluronic Acd (JOINT HEALTH PO) Take 1 tablet by mouth daily.    . ondansetron (ZOFRAN) 8 MG tablet Take 1 tablet (8 mg total) by mouth 2 (two) times daily as needed for refractory nausea / vomiting. (Patient not taking: Reported on 08/11/2017) 60 tablet 2  . prochlorperazine (COMPAZINE) 10 MG tablet Take 1 tablet (10 mg  total) by mouth every 6 (six) hours as needed (Nausea or vomiting). (Patient not taking: Reported on 07/20/2017) 60 tablet 2   No current facility-administered medications for this visit.     OBJECTIVE: Vitals:   09/02/17 1041  BP: 125/64  Pulse: 73  Resp: 18  Temp: 98.3 F (36.8 C)     Body mass index is 19.87 kg/m.    ECOG FS:1 - Symptomatic but completely ambulatory  General: Well-developed, well-nourished, no acute distress. Eyes: Pink conjunctiva, anicteric sclera. Lungs: Clear to auscultation bilaterally. Heart: Regular rate and rhythm. No rubs, murmurs, or gallops. Abdomen: Soft. No organomegaly noted, normoactive bowel sounds. Musculoskeletal: No edema, cyanosis, or clubbing. Neuro: Alert, answering all questions appropriately. Cranial nerves grossly intact. Skin: No rashes or petechiae noted. Psych: Normal affect.     LAB RESULTS:  Lab Results  Component Value Date   NA 135 09/02/2017   K 4.7 09/02/2017   CL 103 09/02/2017   CO2 26 09/02/2017   GLUCOSE 100 (H) 09/02/2017   BUN 42 (H) 09/02/2017   CREATININE 1.07 (H) 09/02/2017   CALCIUM 8.5 (L) 09/02/2017   PROT 6.3 (L) 05/19/2017   ALBUMIN 2.6 (L) 05/19/2017   AST 29 05/19/2017   ALT 13 (L) 05/19/2017    ALKPHOS 77 05/19/2017   BILITOT 0.5 05/19/2017   GFRNONAA 45 (L) 09/02/2017   GFRAA 52 (L) 09/02/2017    Lab Results  Component Value Date   WBC 3.3 (L) 09/02/2017   NEUTROABS 2.3 09/02/2017   HGB 8.2 (L) 09/02/2017   HCT 24.7 (L) 09/02/2017   MCV 104.9 (H) 09/02/2017   PLT 261 09/02/2017     STUDIES: Ct Abdomen Pelvis W Contrast  Result Date: 09/01/2017 CLINICAL DATA:  84 year old female diagnosed with ovarian cancer in September 2017 status post hysterectomy and four chemotherapy treatments. Followup study. EXAM: CT ABDOMEN AND PELVIS WITH CONTRAST TECHNIQUE: Multidetector CT imaging of the abdomen and pelvis was performed using the standard protocol following bolus administration of intravenous contrast. CONTRAST:  91mL ISOVUE-300 IOPAMIDOL (ISOVUE-300) INJECTION 61% COMPARISON:  CT of the abdomen and pelvis 05/14/2017. FINDINGS: Lower chest: Large hiatal hernia. Atherosclerotic calcifications in the right coronary artery. Calcifications of the aortic valve and mitral valve. Hepatobiliary: Previously noted capsular implants along the right lobe of the liver have significantly decreased in size becoming far more sessile in appearance, best appreciated on axial image 21 of series 2 where the largest residual lesion currently measures 3.7 x 0.8 cm. No other new suspicious cystic or solid hepatic lesions. No intra or extrahepatic biliary ductal dilatation. Gallbladder is nearly completely decompressed, but otherwise unremarkable in appearance. Pancreas: No pancreatic mass. No pancreatic ductal dilatation. No pancreatic or peripancreatic fluid or inflammatory changes. Spleen: Calcified granuloma in the spleen. Adrenals/Urinary Tract: 1.5 cm left adrenal nodule (axial image 21 of series 2) is indeterminate. Right adrenal gland and bilateral kidneys are normal in appearance. No hydroureteronephrosis. Base of the urinary bladder sits well below the level of the pubococcygeal line, indicative of a  severe cystocele. There is also a 2.5 cm diverticulum of the superior aspect of the urinary bladder on the left side (coronal image 40 of series 8). Stomach/Bowel: Intraabdominal portion of the stomach is normal. No pathologic dilatation of small bowel or colon. Large amount of small bowel and rectum extend well below the level of the pubococcygeal line indicative of both enterocele and rectocele. The appendix is not confidently identified and may be surgically absent. Regardless, there are no inflammatory changes  noted adjacent to the cecum to suggest the presence of an acute appendicitis at this time. Vascular/Lymphatic: Aortic atherosclerosis, without evidence of aneurysm or dissection in the abdominal or pelvic vasculature. No lymphadenopathy noted in the abdomen or pelvis. Reproductive: Status post total abdominal hysterectomy and bilateral salpingo oophorectomy. Other: Previously noted peritoneal implants have markedly regressed compared to the prior study. The largest residual implant in the left upper quadrant is visualized on axial image 14 of series 2 measuring 9 x 16 mm. Previously noted omental caking is no longer visualized, although peritoneal surfaces appear diffusely mildly thickened. With trace amount of residual ascites significantly decreased compared to the prior study. No pneumoperitoneum. Musculoskeletal: Severe dextroscoliosis of the thoracolumbar spine convex to the right at the level of the thoracolumbar junction. There are no aggressive appearing lytic or blastic lesions noted in the visualized portions of the skeleton. IMPRESSION: 1. Significant positive response to therapy when compared to the prior study, with decreased volume of malignant ascites, regression of numerous omental and peritoneal implants, and regression of previously noted serosal implants on the surface of the liver and in the hepatic hilum. There diffuse continues peritoneal to be some thickening, smaller implants, and a  trace volume of malignant ascites. No new sites of metastatic disease are noted. 2. Resolution of previously noted small bilateral pleural effusions. 3. Aortic atherosclerosis, in addition to at least right coronary artery disease. Please note that although the presence of coronary artery calcium documents the presence of coronary artery disease, the severity of this disease and any potential stenosis cannot be assessed on this non-gated CT examination. Assessment for potential risk factor modification, dietary therapy or pharmacologic therapy may be warranted, if clinically indicated. 4. There are calcifications of the aortic and mitral valves. Echocardiographic correlation for evaluation of potential valvular dysfunction may be warranted if clinically indicated. 5. Severe laxity of the levator ani musculature with diffuse pelvic floor dysfunction, including large cystocele, enterocele and rectocele. 6. Large hiatal hernia. Electronically Signed   By: Vinnie Langton M.D.   On: 09/01/2017 09:09    ASSESSMENT: Stage IIIC ovarian cancer.  PLAN:    1. Stage IIIC ovarian cancer: Given patient's advanced age she is not a surgical candidate. CT scan results from September 01, 2017 reviewed independently and report as above with significant response to therapy and no new or progressive disease noted.  Patient Ca-125 is now within normal limits.  Continue with palliative chemotherapy using single agent carboplatinum. Although, combination therapy is recommended, given patient's age and performance status, the toxicity was thought to be too high.  Proceed with cycle 5 of single agent carboplatinum today.  Patient will also get Neulasta with remainder of her treatments. Return to clinic in 3 weeks for further evaluation and consideration of cycle 6.  Will repeat imaging at the conclusion of cycle 6 and if there is continued improvement of disease burden, will likely discontinue chemotherapy at that time.   2. Lupus  anticoagulant: Patient was noted to have an elevated PTT as well as increased bleeding during her surgery for rectal prolapse. Continue to monitor closely and repeat lupus anticoagulant panel at some point in the future.   3. Anemia: Patient's hemoglobin has trended down slightly.  She received 2 doses of IV Feraheme in October 2018.  4. Dizziness: Patient does not complain of this today.  Likely secondary to poor PO intake and mild dehydration. Encouraged improved water consumption. 5.  Neutropenia: Add Neulasta with remainder of treatments. 6.  Chronic  renal insufficiency: Patient's creatinine is elevated but stable.  Monitor.  Patient expressed understanding and was in agreement with this plan. She also understands that She can call clinic at any time with any questions, concerns, or complaints.   Cancer Staging Malignant neoplasm of ovary [facility]) Staging form: Ovary, Fallopian Tube, and Primary Peritoneal Carcinoma, AJCC 8th Edition - Clinical stage from 05/29/2017: Stage IIIC (cT3c, cN1b, cM0) - Signed by Lloyd Huger, MD on 05/29/2017   Lloyd Huger, MD   09/05/2017 11:05 AM

## 2017-08-31 ENCOUNTER — Ambulatory Visit
Admission: RE | Admit: 2017-08-31 | Discharge: 2017-08-31 | Disposition: A | Discharge status: Home / Self Care | Payer: Medicare Other | Source: Ambulatory Visit | Attending: Oncology | Admitting: Oncology

## 2017-08-31 DIAGNOSIS — R180 Malignant ascites: Secondary | ICD-10-CM | POA: Diagnosis not present

## 2017-08-31 DIAGNOSIS — I700 Atherosclerosis of aorta: Secondary | ICD-10-CM | POA: Insufficient documentation

## 2017-08-31 DIAGNOSIS — Z90710 Acquired absence of both cervix and uterus: Secondary | ICD-10-CM | POA: Insufficient documentation

## 2017-08-31 DIAGNOSIS — I2510 Atherosclerotic heart disease of native coronary artery without angina pectoris: Secondary | ICD-10-CM | POA: Insufficient documentation

## 2017-08-31 DIAGNOSIS — C569 Malignant neoplasm of unspecified ovary: Secondary | ICD-10-CM | POA: Diagnosis not present

## 2017-08-31 DIAGNOSIS — Z90722 Acquired absence of ovaries, bilateral: Secondary | ICD-10-CM | POA: Diagnosis not present

## 2017-08-31 DIAGNOSIS — N816 Rectocele: Secondary | ICD-10-CM | POA: Diagnosis not present

## 2017-08-31 DIAGNOSIS — N8110 Cystocele, unspecified: Secondary | ICD-10-CM | POA: Diagnosis not present

## 2017-08-31 DIAGNOSIS — K449 Diaphragmatic hernia without obstruction or gangrene: Secondary | ICD-10-CM | POA: Diagnosis not present

## 2017-08-31 LAB — POCT I-STAT CREATININE: Creatinine, Ser: 1.1 mg/dL — ABNORMAL HIGH (ref 0.44–1.00)

## 2017-08-31 MED ORDER — IOPAMIDOL (ISOVUE-300) INJECTION 61%
55.0000 mL | Freq: Once | INTRAVENOUS | Status: AC | PRN
Start: 2017-08-31 — End: 2017-08-31
  Administered 2017-08-31: 55 mL via INTRAVENOUS

## 2017-09-02 ENCOUNTER — Inpatient Hospital Stay: Payer: Medicare Other

## 2017-09-02 ENCOUNTER — Inpatient Hospital Stay: Payer: Medicare Other | Attending: Oncology

## 2017-09-02 ENCOUNTER — Inpatient Hospital Stay (HOSPITAL_BASED_OUTPATIENT_CLINIC_OR_DEPARTMENT_OTHER): Payer: Medicare Other | Admitting: Oncology

## 2017-09-02 VITALS — BP 125/64 | HR 73 | Temp 98.3°F | Resp 18 | Wt 91.8 lb

## 2017-09-02 DIAGNOSIS — D649 Anemia, unspecified: Secondary | ICD-10-CM

## 2017-09-02 DIAGNOSIS — K219 Gastro-esophageal reflux disease without esophagitis: Secondary | ICD-10-CM | POA: Insufficient documentation

## 2017-09-02 DIAGNOSIS — T451X5S Adverse effect of antineoplastic and immunosuppressive drugs, sequela: Secondary | ICD-10-CM | POA: Insufficient documentation

## 2017-09-02 DIAGNOSIS — Z90710 Acquired absence of both cervix and uterus: Secondary | ICD-10-CM

## 2017-09-02 DIAGNOSIS — I2510 Atherosclerotic heart disease of native coronary artery without angina pectoris: Secondary | ICD-10-CM | POA: Insufficient documentation

## 2017-09-02 DIAGNOSIS — C569 Malignant neoplasm of unspecified ovary: Secondary | ICD-10-CM | POA: Diagnosis present

## 2017-09-02 DIAGNOSIS — Z79899 Other long term (current) drug therapy: Secondary | ICD-10-CM

## 2017-09-02 DIAGNOSIS — M1990 Unspecified osteoarthritis, unspecified site: Secondary | ICD-10-CM

## 2017-09-02 DIAGNOSIS — K449 Diaphragmatic hernia without obstruction or gangrene: Secondary | ICD-10-CM | POA: Insufficient documentation

## 2017-09-02 DIAGNOSIS — Z801 Family history of malignant neoplasm of trachea, bronchus and lung: Secondary | ICD-10-CM

## 2017-09-02 DIAGNOSIS — R180 Malignant ascites: Secondary | ICD-10-CM

## 2017-09-02 DIAGNOSIS — I129 Hypertensive chronic kidney disease with stage 1 through stage 4 chronic kidney disease, or unspecified chronic kidney disease: Secondary | ICD-10-CM | POA: Insufficient documentation

## 2017-09-02 DIAGNOSIS — N189 Chronic kidney disease, unspecified: Secondary | ICD-10-CM | POA: Insufficient documentation

## 2017-09-02 DIAGNOSIS — Z9221 Personal history of antineoplastic chemotherapy: Secondary | ICD-10-CM

## 2017-09-02 DIAGNOSIS — D6862 Lupus anticoagulant syndrome: Secondary | ICD-10-CM | POA: Diagnosis not present

## 2017-09-02 DIAGNOSIS — I700 Atherosclerosis of aorta: Secondary | ICD-10-CM

## 2017-09-02 DIAGNOSIS — Z5111 Encounter for antineoplastic chemotherapy: Secondary | ICD-10-CM | POA: Insufficient documentation

## 2017-09-02 DIAGNOSIS — D701 Agranulocytosis secondary to cancer chemotherapy: Secondary | ICD-10-CM

## 2017-09-02 DIAGNOSIS — Z90722 Acquired absence of ovaries, bilateral: Secondary | ICD-10-CM | POA: Insufficient documentation

## 2017-09-02 LAB — BASIC METABOLIC PANEL
Anion gap: 6 (ref 5–15)
BUN: 42 mg/dL — ABNORMAL HIGH (ref 6–20)
CO2: 26 mmol/L (ref 22–32)
Calcium: 8.5 mg/dL — ABNORMAL LOW (ref 8.9–10.3)
Chloride: 103 mmol/L (ref 101–111)
Creatinine, Ser: 1.07 mg/dL — ABNORMAL HIGH (ref 0.44–1.00)
GFR calc Af Amer: 52 mL/min — ABNORMAL LOW (ref >60)
GFR calc non Af Amer: 45 mL/min — ABNORMAL LOW (ref >60)
Glucose, Bld: 100 mg/dL — ABNORMAL HIGH (ref 65–99)
Potassium: 4.7 mmol/L (ref 3.5–5.1)
Sodium: 135 mmol/L (ref 135–145)

## 2017-09-02 LAB — CBC WITH DIFFERENTIAL/PLATELET
Basophils Absolute: 0 10*3/uL (ref 0–0.1)
Basophils Relative: 0 %
Eosinophils Absolute: 0 10*3/uL (ref 0–0.7)
Eosinophils Relative: 0 %
HCT: 24.7 % — ABNORMAL LOW (ref 35.0–47.0)
Hemoglobin: 8.2 g/dL — ABNORMAL LOW (ref 12.0–16.0)
Lymphocytes Relative: 22 %
Lymphs Abs: 0.7 10*3/uL — ABNORMAL LOW (ref 1.0–3.6)
MCH: 34.7 pg — ABNORMAL HIGH (ref 26.0–34.0)
MCHC: 33.1 g/dL (ref 32.0–36.0)
MCV: 104.9 fL — ABNORMAL HIGH (ref 80.0–100.0)
Monocytes Absolute: 0.3 10*3/uL (ref 0.2–0.9)
Monocytes Relative: 9 %
Neutro Abs: 2.3 10*3/uL (ref 1.4–6.5)
Neutrophils Relative %: 69 %
Platelets: 261 10*3/uL (ref 150–440)
RBC: 2.35 MIL/uL — ABNORMAL LOW (ref 3.80–5.20)
RDW: 22.2 % — ABNORMAL HIGH (ref 11.5–14.5)
WBC: 3.3 10*3/uL — ABNORMAL LOW (ref 3.6–11.0)

## 2017-09-02 MED ORDER — PEGFILGRASTIM 6 MG/0.6ML ~~LOC~~ PSKT
6.0000 mg | PREFILLED_SYRINGE | Freq: Once | SUBCUTANEOUS | Status: AC
  Administered 2017-09-02: 6 mg via SUBCUTANEOUS
  Filled 2017-09-02: qty 0.6

## 2017-09-02 MED ORDER — SODIUM CHLORIDE 0.9 % IV SOLN
Freq: Once | INTRAVENOUS | Status: AC
  Administered 2017-09-02: 11:00:00 via INTRAVENOUS
  Filled 2017-09-02: qty 1000

## 2017-09-02 MED ORDER — SODIUM CHLORIDE 0.9 % IV SOLN
240.0000 mg | Freq: Once | INTRAVENOUS | Status: AC
  Administered 2017-09-02: 240 mg via INTRAVENOUS
  Filled 2017-09-02: qty 24

## 2017-09-02 MED ORDER — HEPARIN SOD (PORK) LOCK FLUSH 100 UNIT/ML IV SOLN
500.0000 [IU] | Freq: Once | INTRAVENOUS | Status: AC
  Administered 2017-09-02: 500 [IU] via INTRAVENOUS
  Filled 2017-09-02: qty 5

## 2017-09-02 MED ORDER — PALONOSETRON HCL INJECTION 0.25 MG/5ML
0.2500 mg | Freq: Once | INTRAVENOUS | Status: AC
  Administered 2017-09-02: 0.25 mg via INTRAVENOUS
  Filled 2017-09-02: qty 5

## 2017-09-02 MED ORDER — SODIUM CHLORIDE 0.9 % IV SOLN
Freq: Once | INTRAVENOUS | Status: AC
  Administered 2017-09-02: 12:00:00 via INTRAVENOUS
  Filled 2017-09-02: qty 5

## 2017-09-02 NOTE — Progress Notes (Signed)
Dose Change warning for Carboplatin, Matt, Pharmacist will review.

## 2017-09-03 LAB — CA 125: Cancer Antigen (CA) 125: 14.4 U/mL (ref 0.0–38.1)

## 2017-09-23 ENCOUNTER — Inpatient Hospital Stay: Payer: Medicare Other | Attending: Oncology

## 2017-09-23 ENCOUNTER — Encounter: Payer: Self-pay | Admitting: Oncology

## 2017-09-23 ENCOUNTER — Inpatient Hospital Stay: Payer: Medicare Other

## 2017-09-23 ENCOUNTER — Inpatient Hospital Stay (HOSPITAL_BASED_OUTPATIENT_CLINIC_OR_DEPARTMENT_OTHER): Payer: Medicare Other | Admitting: Oncology

## 2017-09-23 VITALS — BP 135/81 | HR 94 | Temp 97.4°F | Resp 18 | Wt 93.0 lb

## 2017-09-23 DIAGNOSIS — D649 Anemia, unspecified: Secondary | ICD-10-CM | POA: Insufficient documentation

## 2017-09-23 DIAGNOSIS — Z79899 Other long term (current) drug therapy: Secondary | ICD-10-CM

## 2017-09-23 DIAGNOSIS — Z801 Family history of malignant neoplasm of trachea, bronchus and lung: Secondary | ICD-10-CM | POA: Diagnosis not present

## 2017-09-23 DIAGNOSIS — R42 Dizziness and giddiness: Secondary | ICD-10-CM

## 2017-09-23 DIAGNOSIS — N189 Chronic kidney disease, unspecified: Secondary | ICD-10-CM | POA: Insufficient documentation

## 2017-09-23 DIAGNOSIS — K449 Diaphragmatic hernia without obstruction or gangrene: Secondary | ICD-10-CM | POA: Diagnosis not present

## 2017-09-23 DIAGNOSIS — Z5111 Encounter for antineoplastic chemotherapy: Secondary | ICD-10-CM | POA: Diagnosis not present

## 2017-09-23 DIAGNOSIS — C569 Malignant neoplasm of unspecified ovary: Secondary | ICD-10-CM | POA: Diagnosis not present

## 2017-09-23 DIAGNOSIS — D701 Agranulocytosis secondary to cancer chemotherapy: Secondary | ICD-10-CM | POA: Diagnosis not present

## 2017-09-23 DIAGNOSIS — I2510 Atherosclerotic heart disease of native coronary artery without angina pectoris: Secondary | ICD-10-CM

## 2017-09-23 DIAGNOSIS — I129 Hypertensive chronic kidney disease with stage 1 through stage 4 chronic kidney disease, or unspecified chronic kidney disease: Secondary | ICD-10-CM | POA: Diagnosis not present

## 2017-09-23 DIAGNOSIS — Z90722 Acquired absence of ovaries, bilateral: Secondary | ICD-10-CM

## 2017-09-23 DIAGNOSIS — K219 Gastro-esophageal reflux disease without esophagitis: Secondary | ICD-10-CM | POA: Insufficient documentation

## 2017-09-23 DIAGNOSIS — T451X5S Adverse effect of antineoplastic and immunosuppressive drugs, sequela: Secondary | ICD-10-CM

## 2017-09-23 DIAGNOSIS — Z90710 Acquired absence of both cervix and uterus: Secondary | ICD-10-CM | POA: Insufficient documentation

## 2017-09-23 DIAGNOSIS — M1990 Unspecified osteoarthritis, unspecified site: Secondary | ICD-10-CM | POA: Insufficient documentation

## 2017-09-23 DIAGNOSIS — R791 Abnormal coagulation profile: Secondary | ICD-10-CM | POA: Diagnosis not present

## 2017-09-23 LAB — CBC WITH DIFFERENTIAL/PLATELET
Basophils Absolute: 0 10*3/uL (ref 0–0.1)
Basophils Relative: 0 %
Eosinophils Absolute: 0 10*3/uL (ref 0–0.7)
Eosinophils Relative: 0 %
HCT: 27.4 % — ABNORMAL LOW (ref 35.0–47.0)
Hemoglobin: 9.1 g/dL — ABNORMAL LOW (ref 12.0–16.0)
Lymphocytes Relative: 10 %
Lymphs Abs: 0.6 10*3/uL — ABNORMAL LOW (ref 1.0–3.6)
MCH: 35.9 pg — ABNORMAL HIGH (ref 26.0–34.0)
MCHC: 33.4 g/dL (ref 32.0–36.0)
MCV: 107.5 fL — ABNORMAL HIGH (ref 80.0–100.0)
Monocytes Absolute: 0.4 10*3/uL (ref 0.2–0.9)
Monocytes Relative: 6 %
Neutro Abs: 5 10*3/uL (ref 1.4–6.5)
Neutrophils Relative %: 84 %
Platelets: 171 10*3/uL (ref 150–440)
RBC: 2.55 MIL/uL — ABNORMAL LOW (ref 3.80–5.20)
RDW: 17.6 % — ABNORMAL HIGH (ref 11.5–14.5)
WBC: 6.1 10*3/uL (ref 3.6–11.0)

## 2017-09-23 LAB — BASIC METABOLIC PANEL
Anion gap: 6 (ref 5–15)
BUN: 44 mg/dL — ABNORMAL HIGH (ref 6–20)
CO2: 27 mmol/L (ref 22–32)
Calcium: 9 mg/dL (ref 8.9–10.3)
Chloride: 106 mmol/L (ref 101–111)
Creatinine, Ser: 0.85 mg/dL (ref 0.44–1.00)
GFR calc Af Amer: >60 mL/min (ref >60)
GFR calc non Af Amer: 59 mL/min — ABNORMAL LOW (ref >60)
Glucose, Bld: 125 mg/dL — ABNORMAL HIGH (ref 65–99)
Potassium: 4.3 mmol/L (ref 3.5–5.1)
Sodium: 139 mmol/L (ref 135–145)

## 2017-09-23 MED ORDER — CYCLOBENZAPRINE HCL 10 MG PO TABS: 10.0000 mg | ORAL_TABLET | Freq: Every day | ORAL | 2 refills | Status: DC

## 2017-09-23 MED ORDER — HEPARIN SOD (PORK) LOCK FLUSH 100 UNIT/ML IV SOLN
500.0000 [IU] | Freq: Once | INTRAVENOUS | Status: AC | PRN
  Administered 2017-09-23: 500 [IU]
  Filled 2017-09-23: qty 5

## 2017-09-23 MED ORDER — SODIUM CHLORIDE 0.9 % IV SOLN
240.0000 mg | Freq: Once | INTRAVENOUS | Status: AC
  Administered 2017-09-23: 240 mg via INTRAVENOUS
  Filled 2017-09-23: qty 24

## 2017-09-23 MED ORDER — PEGFILGRASTIM 6 MG/0.6ML ~~LOC~~ PSKT
6.0000 mg | PREFILLED_SYRINGE | Freq: Once | SUBCUTANEOUS | Status: AC
  Administered 2017-09-23: 6 mg via SUBCUTANEOUS
  Filled 2017-09-23: qty 0.6

## 2017-09-23 MED ORDER — SODIUM CHLORIDE 0.9 % IV SOLN
Freq: Once | INTRAVENOUS | Status: AC
  Administered 2017-09-23: 11:00:00 via INTRAVENOUS
  Filled 2017-09-23: qty 1000

## 2017-09-23 MED ORDER — PALONOSETRON HCL INJECTION 0.25 MG/5ML
0.2500 mg | Freq: Once | INTRAVENOUS | Status: AC
  Administered 2017-09-23: 0.25 mg via INTRAVENOUS
  Filled 2017-09-23: qty 5

## 2017-09-23 MED ORDER — FOSAPREPITANT DIMEGLUMINE INJECTION 150 MG
Freq: Once | INTRAVENOUS | Status: AC
  Administered 2017-09-23: 11:00:00 via INTRAVENOUS
  Filled 2017-09-23: qty 5

## 2017-09-23 NOTE — Progress Notes (Signed)
[place]  [phone] [phone]  ID: Tamara Mckinney OB: 06-06-1928  MR#: [id number]  FFM#:[id number]  Patient Care Team: [name], MD as PCP - General (Family Medicine) [name], RN as Registered Nurse  CHIEF COMPLAINT: Stage IIIC ovarian cancer.  INTERVAL HISTORY: Patient returns to clinic today for further evaluation and consideration of cycle 6 of single agent Carbo.  She continues to feel well.  She has occasional dizziness when standing but this is not a new problem.  She continues to have a good appetite and denies any neurological complaints.  She has no complaints of abdominal pain or bloating.  She denies any fevers, chest pain or shortness of breath.  She has no nausea, vomiting, constipation or diarrhea.  She has no urinary complaints.  REVIEW OF SYSTEMS:   Review of Systems  Constitutional: Negative.  Negative for fever, malaise/fatigue and weight loss.  Respiratory: Negative.  Negative for cough and shortness of breath.   Cardiovascular: Negative.  Negative for chest pain and leg swelling.  Gastrointestinal: Negative for abdominal pain, blood in stool, constipation, diarrhea, heartburn, melena, nausea and vomiting.  Genitourinary: Negative.   Musculoskeletal: Negative.   Skin: Negative.  Negative for rash.  Neurological: Positive for dizziness (When standing). Negative for weakness.  Endo/Heme/Allergies: Does not bruise/bleed easily.  Psychiatric/Behavioral: Negative.  The patient is not nervous/anxious.    As per HPI. Otherwise, a complete review of systems is negative.  PAST MEDICAL HISTORY: Past Medical History:  Diagnosis Date  . Anemia   . Arthritis   . Asthma   . Cancer ([place])   . Dyspnea   . GERD (gastroesophageal reflux disease)   . Hypertension     PAST SURGICAL HISTORY: Past Surgical History:  Procedure Laterality Date  . ABDOMINAL HYSTERECTOMY    . BREAST BIOPSY     x6  . BUNIONECTOMY  Bilateral   . CATARACT EXTRACTION, BILATERAL    . FLEXIBLE SIGMOIDOSCOPY N/A 05/21/2017   Procedure: FLEXIBLE SIGMOIDOSCOPY;  Surgeon: Lin Landsman, MD;  Location: [facility] ENDOSCOPY;  Service: Gastroenterology;  Laterality: N/A;  . INCONTINENCE SURGERY    . PORTA CATH INSERTION N/A 06/16/2017   Procedure: PORTA CATH INSERTION;  Surgeon: Algernon Huxley, MD;  Location: [facility];  Service: Cardiovascular;  Laterality: N/A;  . REPAIR OF RECTAL PROLAPSE N/A 05/11/2017   Procedure: REPAIR OF RECTAL PROLAPSE;  Surgeon: Leonie Green, MD;  Location: [facility] ORS;  Service: General;  Laterality: N/A;  . TONSILLECTOMY    . VAGINA SURGERY     uncertain procedure performed    FAMILY HISTORY: Family History  Problem Relation Age of Onset  . Heart disease Mother   . Heart disease Father   . Heart disease Sister   . Heart attack Sister   . Ulcerative colitis Brother   . Lung cancer Brother   . Thyroid cancer Sister   . Asthma Sister   . Diabetes Sister   . Asthma Sister   . Pancreatic cancer Sister   . Dementia Sister   . Asthma Brother   . Heart disease Brother   . Asthma Brother   . Lung cancer Brother   . Asthma Brother   . Lung cancer Brother   . Lung cancer Brother   . Rheum arthritis Brother     ADVANCED DIRECTIVES (Y/N):  N  HEALTH MAINTENANCE: Social History   Tobacco Use  . Smoking status: Never Smoker  . Smokeless tobacco: Never Used  Substance Use Topics  . Alcohol use: No  . Drug use: No     Colonoscopy:  PAP:  Bone density:  Lipid panel:  No Known Allergies  Current Outpatient Medications  Medication Sig Dispense Refill  . acetaminophen (TYLENOL) 500 MG tablet Take 500 mg by mouth 2 (two) times daily as needed for moderate pain.     Marland Kitchen albuterol (PROVENTIL HFA;VENTOLIN HFA) 108 (90 Base) MCG/ACT inhaler Inhale 1-2 puffs into the lungs every 6 (six) hours as needed for wheezing or shortness of breath.    Marland Kitchen amLODipine (NORVASC) 5 MG tablet Take  5 mg by mouth daily with breakfast.    . Ascorbic Acid (VITAMIN C) 1000 MG tablet Take 1,000 mg by mouth daily.    . Calcium Carb-Cholecalciferol (CALTRATE 600+D) 600-800 MG-UNIT TABS Take 1 tablet by mouth daily.    . Carboxymeth-Glyc-Polysorb PF (REFRESH OPTIVE ADVANCED PF) 0.5-1-0.5 % SOLN Place 1-2 drops into both eyes 3 (three) times daily as needed. For dry, irritated eyes    . Carboxymethylcellulose Sod PF (REFRESH CELLUVISC) 1 % GEL Place 1 drop into both eyes at bedtime. In the middle of the night     . cetirizine (ZYRTEC) 10 MG tablet Take 10 mg by mouth at bedtime as needed for allergies.    . Cholecalciferol (VITAMIN D3) 2000 units capsule Take 2,000 Units by mouth daily.    . Cyanocobalamin (RA VITAMIN B12) 2000 MCG TBCR Take 2,000 mcg by mouth daily.    . cyclobenzaprine (FLEXERIL) 10 MG tablet Take 1 tablet (10 mg total) by mouth at bedtime. 30 tablet 2  . DHA-EPA-Flaxseed Oil-Vitamin E (THERA TEARS NUTRITION PO) Take 2 tablets by mouth daily.    Marland Kitchen docusate sodium (COLACE) 100 MG capsule Take 100-300 mg by mouth at bedtime as needed for mild constipation.    Marland Kitchen esomeprazole (NEXIUM) 40 MG capsule Take 40 mg by mouth daily before breakfast.     . fluticasone furoate-vilanterol (BREO ELLIPTA) 100-25 MCG/INH AEPB Inhale 1 puff into the lungs at bedtime.    . furosemide (LASIX) 40 MG tablet Take 40 mg by mouth daily with breakfast.     . Glucosamine-MSM-Hyaluronic Acd (JOINT HEALTH PO) Take 1 tablet by mouth daily.    . hypromellose (SYSTANE OVERNIGHT THERAPY) 0.3 % GEL ophthalmic ointment Place 1 application into both eyes at bedtime.    Marland Kitchen ipratropium-albuterol (DUONEB) 0.5-2.5 (3) MG/3ML SOLN Take 3 mLs by nebulization every 6 (six) hours as needed. For wheezing/shortness of breath    . lidocaine-prilocaine (EMLA) cream Apply 1 application topically as needed. Apply small amount to port site at least 1 hour prior to it being accessed, cover with plastic wrap 30 g 1  . losartan (COZAAR)  100 MG tablet Take 100 mg by mouth at bedtime.    . meclizine (ANTIVERT) 25 MG tablet Take 25 mg by mouth 3 (three) times daily as needed (for vertigo).     . meloxicam (MOBIC) 15 MG tablet Take 15 mg by mouth daily with breakfast.    . montelukast (SINGULAIR) 10 MG tablet Take 10 mg by mouth at bedtime.    . ondansetron (ZOFRAN) 8 MG tablet Take 1 tablet (8 mg total) by mouth 2 (two) times daily as needed for refractory nausea / vomiting. 60 tablet 2  . prochlorperazine (COMPAZINE) 10 MG tablet Take 1 tablet (10 mg total) by mouth every 6 (six) hours as needed (Nausea or vomiting). 60 tablet 2  . triamcinolone cream (KENALOG) 0.5 % Apply  1 application topically 3 (three) times daily as needed. For inflammation     No current facility-administered medications for this visit.     OBJECTIVE: Vitals:   09/23/17 0959  BP: 135/81  Pulse: 94  Resp: 18  Temp: (!) 97.4 F (36.3 C)     Body mass index is 20.13 kg/m.    ECOG FS:1 - Symptomatic but completely ambulatory  General: Well-developed, well-nourished, no acute distress. Eyes: Pink conjunctiva, anicteric sclera. Lungs: Clear to auscultation bilaterally. Heart: Regular rate and rhythm. No rubs, murmurs, or gallops. Abdomen: Soft. No organomegaly noted, normoactive bowel sounds. Musculoskeletal: No edema, cyanosis, or clubbing. Neuro: Alert, answering all questions appropriately. Cranial nerves grossly intact. Skin: No rashes or petechiae noted. Psych: Normal affect.     LAB RESULTS:  Lab Results  Component Value Date   NA 139 09/23/2017   K 4.3 09/23/2017   CL 106 09/23/2017   CO2 27 09/23/2017   GLUCOSE 125 (H) 09/23/2017   BUN 44 (H) 09/23/2017   CREATININE 0.85 09/23/2017   CALCIUM 9.0 09/23/2017   PROT 6.3 (L) 05/19/2017   ALBUMIN 2.6 (L) 05/19/2017   AST 29 05/19/2017   ALT 13 (L) 05/19/2017   ALKPHOS 77 05/19/2017   BILITOT 0.5 05/19/2017   GFRNONAA 59 (L) 09/23/2017   GFRAA >60 09/23/2017    Lab Results    Component Value Date   WBC 6.1 09/23/2017   NEUTROABS 5.0 09/23/2017   HGB 9.1 (L) 09/23/2017   HCT 27.4 (L) 09/23/2017   MCV 107.5 (H) 09/23/2017   PLT 171 09/23/2017     STUDIES: Ct Abdomen Pelvis W Contrast  Result Date: 09/01/2017 CLINICAL DATA:  83 year old female diagnosed with ovarian cancer in September 2017 status post hysterectomy and four chemotherapy treatments. Followup study. EXAM: CT ABDOMEN AND PELVIS WITH CONTRAST TECHNIQUE: Multidetector CT imaging of the abdomen and pelvis was performed using the standard protocol following bolus administration of intravenous contrast. CONTRAST:  36mL ISOVUE-300 IOPAMIDOL (ISOVUE-300) INJECTION 61% COMPARISON:  CT of the abdomen and pelvis 05/14/2017. FINDINGS: Lower chest: Large hiatal hernia. Atherosclerotic calcifications in the right coronary artery. Calcifications of the aortic valve and mitral valve. Hepatobiliary: Previously noted capsular implants along the right lobe of the liver have significantly decreased in size becoming far more sessile in appearance, best appreciated on axial image 21 of series 2 where the largest residual lesion currently measures 3.7 x 0.8 cm. No other new suspicious cystic or solid hepatic lesions. No intra or extrahepatic biliary ductal dilatation. Gallbladder is nearly completely decompressed, but otherwise unremarkable in appearance. Pancreas: No pancreatic mass. No pancreatic ductal dilatation. No pancreatic or peripancreatic fluid or inflammatory changes. Spleen: Calcified granuloma in the spleen. Adrenals/Urinary Tract: 1.5 cm left adrenal nodule (axial image 21 of series 2) is indeterminate. Right adrenal gland and bilateral kidneys are normal in appearance. No hydroureteronephrosis. Base of the urinary bladder sits well below the level of the pubococcygeal line, indicative of a severe cystocele. There is also a 2.5 cm diverticulum of the superior aspect of the urinary bladder on the left side (coronal image  40 of series 8). Stomach/Bowel: Intraabdominal portion of the stomach is normal. No pathologic dilatation of small bowel or colon. Large amount of small bowel and rectum extend well below the level of the pubococcygeal line indicative of both enterocele and rectocele. The appendix is not confidently identified and may be surgically absent. Regardless, there are no inflammatory changes noted adjacent to the cecum to suggest the presence of  an acute appendicitis at this time. Vascular/Lymphatic: Aortic atherosclerosis, without evidence of aneurysm or dissection in the abdominal or pelvic vasculature. No lymphadenopathy noted in the abdomen or pelvis. Reproductive: Status post total abdominal hysterectomy and bilateral salpingo oophorectomy. Other: Previously noted peritoneal implants have markedly regressed compared to the prior study. The largest residual implant in the left upper quadrant is visualized on axial image 14 of series 2 measuring 9 x 16 mm. Previously noted omental caking is no longer visualized, although peritoneal surfaces appear diffusely mildly thickened. With trace amount of residual ascites significantly decreased compared to the prior study. No pneumoperitoneum. Musculoskeletal: Severe dextroscoliosis of the thoracolumbar spine convex to the right at the level of the thoracolumbar junction. There are no aggressive appearing lytic or blastic lesions noted in the visualized portions of the skeleton. IMPRESSION: 1. Significant positive response to therapy when compared to the prior study, with decreased volume of malignant ascites, regression of numerous omental and peritoneal implants, and regression of previously noted serosal implants on the surface of the liver and in the hepatic hilum. There diffuse continues peritoneal to be some thickening, smaller implants, and a trace volume of malignant ascites. No new sites of metastatic disease are noted. 2. Resolution of previously noted small bilateral  pleural effusions. 3. Aortic atherosclerosis, in addition to at least right coronary artery disease. Please note that although the presence of coronary artery calcium documents the presence of coronary artery disease, the severity of this disease and any potential stenosis cannot be assessed on this non-gated CT examination. Assessment for potential risk factor modification, dietary therapy or pharmacologic therapy may be warranted, if clinically indicated. 4. There are calcifications of the aortic and mitral valves. Echocardiographic correlation for evaluation of potential valvular dysfunction may be warranted if clinically indicated. 5. Severe laxity of the levator ani musculature with diffuse pelvic floor dysfunction, including large cystocele, enterocele and rectocele. 6. Large hiatal hernia. Electronically Signed   By: Vinnie Langton M.D.   On: 09/01/2017 09:09    ASSESSMENT: Stage IIIC ovarian cancer.  PLAN:    1. Stage IIIC ovarian cancer: Given patient's advanced age she is not a surgical candidate. CT scan results from September 01, 2017 reviewed independently and report as above with significant response to therapy and no new or progressive disease noted.  Patient Ca-125 is now within normal limits.  Continue with palliative chemotherapy using single agent carboplatinum. Although, combination therapy is recommended, given patient's age and performance status, the toxicity was thought to be too high.    Proceed with cycle 6 of single agent carboplatinum today.  Patient will also get Neulasta today.  This will be her last treatment.  Plan to reimage in early March and then to see Dr. Grayland Ormond back for further treatment planning. 2. Lupus anticoagulant: Patient was noted to have an elevated PTT as well as increased bleeding during her surgery for rectal prolapse. Continue to monitor closely and repeat lupus anticoagulant panel at some point in the future.   3. Anemia: Stable today.  Hemoglobin 9.1.   She received 2 doses of IV Feraheme in October 2018.  We will continue to monitor this closely.  4. Dizziness: She continues to complain of this when standing too quickly.  This is likely due to poor p.o. intake and mild dehydration.  Reinforced and encouraged improved water consumption.  Weight is up 2 pounds today.  5.  Neutropenia: Add Neulasta with remainder of treatments.  Stable. 6.  Chronic renal insufficiency: Patient's creatinine  is elevated but stable.  Monitor.  Patient expressed understanding and was in agreement with this plan. She also understands that She can call clinic at any time with any questions, concerns, or complaints.   Cancer Staging Malignant neoplasm of ovary [facility]) Staging form: Ovary, Fallopian Tube, and Primary Peritoneal Carcinoma, AJCC 8th Edition - Clinical stage from 05/29/2017: Stage IIIC (cT3c, cN1b, cM0) - Signed by Lloyd Huger, MD on 05/29/2017   Jacquelin Hawking, NP   09/26/2017 3:47 PM

## 2017-09-23 NOTE — Progress Notes (Signed)
Patient denies any concerns today.  

## 2017-09-24 LAB — CA 125: Cancer Antigen (CA) 125: 12.6 U/mL (ref 0.0–38.1)

## 2017-10-16 NOTE — Progress Notes (Signed)
[place]  [phone]) [phone] Fax:(336) [phone]  ID: Tamara Mckinney OB: 25-Oct-1927  MR#: [id number]  VFI#:[id number]  Patient Care Team: [name], MD as PCP - General (Family Medicine) [name], RN as Registered Nurse Herbert Pun, MD as Consulting Physician (General Surgery)  CHIEF COMPLAINT: Stage IIIC ovarian cancer.  INTERVAL HISTORY: Patient returns to clinic today for further evaluation and discussion of her imaging results. She is having increased rectal pain, but otherwise feels well. She has no neurologic complaints.  She does not complain of abdominal pain or bloating.  She has a good appetite and denies weight loss.  She denies any fevers. She denies any chest pain or shortness of breath. She has no nausea, vomiting, constipation, or diarrhea. She has no urinary complaints. Patient offers no specific complaints today.  REVIEW OF SYSTEMS:   Review of Systems  Constitutional: Negative.  Negative for fever, malaise/fatigue and weight loss.  Respiratory: Negative.  Negative for cough and shortness of breath.   Cardiovascular: Negative.  Negative for chest pain and leg swelling.  Gastrointestinal: Negative for abdominal pain, blood in stool, constipation, diarrhea, heartburn, melena, nausea and vomiting.  Genitourinary: Negative.   Musculoskeletal: Negative.   Skin: Negative.  Negative for rash.  Neurological: Negative.  Negative for dizziness and weakness.  Endo/Heme/Allergies: Does not bruise/bleed easily.  Psychiatric/Behavioral: Negative.  The patient is not nervous/anxious.     As per HPI. Otherwise, a complete review of systems is negative.  PAST MEDICAL HISTORY: Past Medical History:  Diagnosis Date  . Anemia   . Arthritis   . Asthma   . Cancer ([place])   . Dyspnea   . GERD (gastroesophageal reflux disease)   . Hypertension     PAST SURGICAL HISTORY: Past Surgical History:  Procedure Laterality Date  .  ABDOMINAL HYSTERECTOMY    . BREAST BIOPSY     x6  . BUNIONECTOMY Bilateral   . CATARACT EXTRACTION, BILATERAL    . FLEXIBLE SIGMOIDOSCOPY N/A 05/21/2017   Procedure: FLEXIBLE SIGMOIDOSCOPY;  Surgeon: Lin Landsman, MD;  Location: [facility] ENDOSCOPY;  Service: Gastroenterology;  Laterality: N/A;  . INCONTINENCE SURGERY    . PORTA CATH INSERTION N/A 06/16/2017   Procedure: PORTA CATH INSERTION;  Surgeon: Algernon Huxley, MD;  Location: [place] [facility] LAB;  Service: Cardiovascular;  Laterality: N/A;  . RECTAL SURGERY  04/2018  . REPAIR OF RECTAL PROLAPSE N/A 05/11/2017   Procedure: REPAIR OF RECTAL PROLAPSE;  Surgeon: Leonie Green, MD;  Location: [facility] ORS;  Service: General;  Laterality: N/A;  . TONSILLECTOMY    . VAGINA SURGERY     uncertain procedure performed    FAMILY HISTORY: Family History  Problem Relation Age of Onset  . Heart disease Mother   . Heart disease Father   . Heart disease Sister   . Heart attack Sister   . Ulcerative colitis Brother   . Lung cancer Brother   . Thyroid cancer Sister   . Asthma Sister   . Diabetes Sister   . Asthma Sister   . Pancreatic cancer Sister   . Dementia Sister   . Asthma Brother   . Heart disease Brother   . Asthma Brother   . Lung cancer Brother   . Asthma Brother   . Lung cancer Brother   . Lung cancer Brother   . Rheum arthritis Brother     ADVANCED DIRECTIVES (Y/N):  N  HEALTH MAINTENANCE: Social History   Tobacco Use  . Smoking  status: Never Smoker  . Smokeless tobacco: Never Used  Substance Use Topics  . Alcohol use: No  . Drug use: No     Colonoscopy:  PAP:  Bone density:  Lipid panel:  No Known Allergies  Current Outpatient Medications  Medication Sig Dispense Refill  . acetaminophen (TYLENOL) 500 MG tablet Take 500 mg by mouth 2 (two) times daily as needed for moderate pain.     Marland Kitchen albuterol (PROVENTIL HFA;VENTOLIN HFA) 108 (90 Base) MCG/ACT inhaler Inhale 1-2 puffs into the lungs every 6 (six)  hours as needed for wheezing or shortness of breath.    Marland Kitchen amLODipine (NORVASC) 5 MG tablet Take 5 mg by mouth daily with breakfast.    . Ascorbic Acid (VITAMIN C) 1000 MG tablet Take 1,000 mg by mouth daily.    . Calcium Carb-Cholecalciferol (CALTRATE 600+D) 600-800 MG-UNIT TABS Take 1 tablet by mouth daily.    . Carboxymeth-Glyc-Polysorb PF (REFRESH OPTIVE ADVANCED PF) 0.5-1-0.5 % SOLN Place 1-2 drops into both eyes 3 (three) times daily as needed. For dry, irritated eyes    . Carboxymethylcellulose Sod PF (REFRESH CELLUVISC) 1 % GEL Place 1 drop into both eyes at bedtime. In the middle of the night     . cetirizine (ZYRTEC) 10 MG tablet Take 10 mg by mouth at bedtime as needed for allergies.    . Cholecalciferol (VITAMIN D3) 2000 units capsule Take 2,000 Units by mouth daily.    . Cyanocobalamin (RA VITAMIN B12) 2000 MCG TBCR Take 2,000 mcg by mouth daily.    . cyclobenzaprine (FLEXERIL) 10 MG tablet Take 1 tablet (10 mg total) by mouth at bedtime. 30 tablet 2  . DHA-EPA-Flaxseed Oil-Vitamin E (THERA TEARS NUTRITION PO) Take 2 tablets by mouth daily.    Marland Kitchen docusate sodium (COLACE) 100 MG capsule Take 100-300 mg by mouth at bedtime as needed for mild constipation.    Marland Kitchen esomeprazole (NEXIUM) 40 MG capsule Take 40 mg by mouth daily before breakfast.     . fluticasone furoate-vilanterol (BREO ELLIPTA) 100-25 MCG/INH AEPB Inhale 1 puff into the lungs at bedtime.    . furosemide (LASIX) 40 MG tablet Take 40 mg by mouth daily with breakfast.     . Glucosamine-MSM-Hyaluronic Acd (JOINT HEALTH PO) Take 1 tablet by mouth daily.    . hypromellose (SYSTANE OVERNIGHT THERAPY) 0.3 % GEL ophthalmic ointment Place 1 application into both eyes at bedtime.    Marland Kitchen ipratropium-albuterol (DUONEB) 0.5-2.5 (3) MG/3ML SOLN Take 3 mLs by nebulization every 6 (six) hours as needed. For wheezing/shortness of breath    . lidocaine-prilocaine (EMLA) cream Apply 1 application topically as needed. Apply small amount to port site  at least 1 hour prior to it being accessed, cover with plastic wrap 30 g 1  . losartan (COZAAR) 100 MG tablet Take 100 mg by mouth at bedtime.    . meclizine (ANTIVERT) 25 MG tablet Take 25 mg by mouth 3 (three) times daily as needed (for vertigo).     . meloxicam (MOBIC) 15 MG tablet Take 15 mg by mouth daily with breakfast.    . montelukast (SINGULAIR) 10 MG tablet Take 10 mg by mouth at bedtime.    . ondansetron (ZOFRAN) 8 MG tablet Take 1 tablet (8 mg total) by mouth 2 (two) times daily as needed for refractory nausea / vomiting. 60 tablet 2  . prochlorperazine (COMPAZINE) 10 MG tablet Take 1 tablet (10 mg total) by mouth every 6 (six) hours as needed (Nausea or vomiting). [place]  tablet 2  . triamcinolone cream (KENALOG) 0.5 % Apply 1 application topically 3 (three) times daily as needed. For inflammation     No current facility-administered medications for this visit.     OBJECTIVE: Vitals:   10/21/17 1036  BP: (!) 150/89  Pulse: 84  Resp: 18  Temp: (!) 97.5 F (36.4 C)  SpO2: 100%     Body mass index is 19.95 kg/m.    ECOG FS:0 - Asymptomatic  General: Well-developed, well-nourished, no acute distress. Eyes: Pink conjunctiva, anicteric sclera. Lungs: Clear to auscultation bilaterally. Heart: Regular rate and rhythm. No rubs, murmurs, or gallops. Abdomen: Soft. No organomegaly noted, normoactive bowel sounds. Musculoskeletal: No edema, cyanosis, or clubbing. Neuro: Alert, answering all questions appropriately. Cranial nerves grossly intact. Skin: No rashes or petechiae noted. Psych: Normal affect.     LAB RESULTS:  Lab Results  Component Value Date   NA 139 09/23/2017   K 4.3 09/23/2017   CL 106 09/23/2017   CO2 27 09/23/2017   GLUCOSE 125 (H) 09/23/2017   BUN 44 (H) 09/23/2017   CREATININE 0.85 09/23/2017   CALCIUM 9.0 09/23/2017   PROT 6.3 (L) 05/19/2017   ALBUMIN 2.6 (L) 05/19/2017   AST 29 05/19/2017   ALT 13 (L) 05/19/2017   ALKPHOS 77 05/19/2017   BILITOT  0.5 05/19/2017   GFRNONAA 59 (L) 09/23/2017   GFRAA >60 09/23/2017    Lab Results  Component Value Date   WBC 6.1 09/23/2017   NEUTROABS 5.0 09/23/2017   HGB 9.1 (L) 09/23/2017   HCT 27.4 (L) 09/23/2017   MCV 107.5 (H) 09/23/2017   PLT 171 09/23/2017     STUDIES: Ct Abdomen Pelvis W Contrast  Result Date: 10/19/2017 CLINICAL DATA:  Ovarian cancer diagnosed 2 years ago currently on chemotherapy for follow-up. EXAM: CT ABDOMEN AND PELVIS WITH CONTRAST TECHNIQUE: Multidetector CT imaging of the abdomen and pelvis was performed using the standard protocol following bolus administration of intravenous contrast. CONTRAST:  27mL ISOVUE-300 IOPAMIDOL (ISOVUE-300) INJECTION 61% COMPARISON:  08/31/2017 FINDINGS: Lower chest: Lung bases are normal. Pectus excavatum deformity. Stable moderate size hiatal hernia. Calcified plaque over the right coronary artery and left anterior descending coronary arteries. Hepatobiliary: Continued interval regression of capsule metastatic implant along the right lobe of the liver now measuring 6 x 20 mm (previously 8 x 37 mm). Liver, gallbladder and biliary tree are otherwise unremarkable. Pancreas: Within normal. Spleen: Calcified granuloma present otherwise within normal. Adrenals/Urinary Tract: 1.5 cm left adrenal nodule unchanged and indeterminate. Right adrenal gland normal. Kidneys normal size without hydronephrosis or nephrolithiasis. Bladder unremarkable. 2.2 cm round cystic structure adjacent the left superior aspect of the bladder unchanged and possibly a bladder diverticulum versus left adnexal cystic structure. Stomach/Bowel: Hiatal hernia unchanged. Small bowel unremarkable. Appendix is normal. Moderate fecal retention throughout the colon. Surgical suture line over the rectum. Vascular/Lymphatic: Mild calcified plaque throughout the abdominal aorta. Vascular structures are otherwise unremarkable. No definite adenopathy. Reproductive: Previous hysterectomy. Other:  Interval in decrease in size and peritoneal implant over the left upper quadrant measuring 7 x 8 mm (previously 9 x 16 mm tiny amount of ascites unchanged. No change in pelvic floor laxity with suggestion of rectocele, enterocele and cystocele. Musculoskeletal: Moderate degenerative change in curvature of the spine with multilevel disc disease unchanged. Degenerative change of the hips and sacroiliac joints unchanged. IMPRESSION: No acute findings. Continued mild interval improvement in patient's known metastatic ovarian carcinoma with slight decrease in size and right liver subcapsular implant and peritoneal  implant over the left upper quadrant as described. Stable minimal ascites. Stable moderate size hiatal hernia. No change in pelvic floor laxity with suggestion of rectocele, enterocele and cystocele. Stable 2.2 cm round cystic structure in the left adnexa. Aortic Atherosclerosis (ICD10-I70.0). Atherosclerotic coronary artery disease. Stable indeterminate left 1.5 cm left adrenal nodule. Electronically Signed   By: Marin Olp M.D.   On: 10/19/2017 14:16    ASSESSMENT: Stage IIIC ovarian cancer.  PLAN:    1. Stage IIIC ovarian cancer: Given patient's advanced age she is not a surgical candidate. CT scan results from October 19, 2017 reviewed independently and report as above with significant response to therapy and no new or progressive disease noted.  Patient's CA 125 continues to be within normal limits.  She last received treatment with single agent carboplatinum on September 23, 2017. Although, combination therapy is recommended, given patient's advanced age, the toxicity was thought to be too high.  No intervention is needed at this time.  Patient can now be followed with serial CA 125.  Additional imaging will only be necessary if there is suspicion of recurrence.  Return to clinic in 3 months for further evaluation. 2. Lupus anticoagulant: Patient was noted to have an elevated PTT as well as  increased bleeding during her surgery for rectal prolapse. Continue to monitor closely and repeat lupus anticoagulant panel at some point in the future.   3. Anemia: Patient's hemoglobin is now trending up.  Monitor.  She received 2 doses of IV Feraheme in October 2018.  4. Chronic renal insufficiency: Patient's creatinine is now within normal limits. 5.  Rectal pain: Patient was given a referral back to surgery for further evaluation.  Patient expressed understanding and was in agreement with this plan. She also understands that She can call clinic at any time with any questions, concerns, or complaints.   Cancer Staging Malignant neoplasm of ovary [facility]) Staging form: Ovary, Fallopian Tube, and Primary Peritoneal Carcinoma, AJCC 8th Edition - Clinical stage from 05/29/2017: Stage IIIC (cT3c, cN1b, cM0) - Signed by Lloyd Huger, MD on 05/29/2017   Lloyd Huger, MD   10/23/2017 8:31 AM

## 2017-10-19 ENCOUNTER — Ambulatory Visit
Admission: RE | Admit: 2017-10-19 | Discharge: 2017-10-19 | Disposition: A | Discharge status: Home / Self Care | Payer: Medicare Other | Source: Ambulatory Visit | Attending: Oncology | Admitting: Oncology

## 2017-10-19 DIAGNOSIS — R9389 Abnormal findings on diagnostic imaging of other specified body structures: Secondary | ICD-10-CM | POA: Diagnosis not present

## 2017-10-19 DIAGNOSIS — K449 Diaphragmatic hernia without obstruction or gangrene: Secondary | ICD-10-CM | POA: Insufficient documentation

## 2017-10-19 DIAGNOSIS — C569 Malignant neoplasm of unspecified ovary: Secondary | ICD-10-CM | POA: Diagnosis not present

## 2017-10-19 DIAGNOSIS — I2510 Atherosclerotic heart disease of native coronary artery without angina pectoris: Secondary | ICD-10-CM | POA: Insufficient documentation

## 2017-10-19 DIAGNOSIS — N8189 Other female genital prolapse: Secondary | ICD-10-CM | POA: Diagnosis not present

## 2017-10-19 DIAGNOSIS — R188 Other ascites: Secondary | ICD-10-CM | POA: Insufficient documentation

## 2017-10-19 DIAGNOSIS — I700 Atherosclerosis of aorta: Secondary | ICD-10-CM | POA: Insufficient documentation

## 2017-10-19 MED ORDER — IOPAMIDOL (ISOVUE-300) INJECTION 61%
75.0000 mL | Freq: Once | INTRAVENOUS | Status: AC | PRN
  Administered 2017-10-19: 75 mL via INTRAVENOUS

## 2017-10-21 ENCOUNTER — Inpatient Hospital Stay: Payer: Medicare Other | Attending: Oncology | Admitting: Oncology

## 2017-10-21 ENCOUNTER — Encounter: Payer: Self-pay | Admitting: Oncology

## 2017-10-21 VITALS — BP 150/89 | HR 84 | Temp 97.5°F | Resp 18 | Ht <= 58 in | Wt 92.2 lb

## 2017-10-21 DIAGNOSIS — I10 Essential (primary) hypertension: Secondary | ICD-10-CM

## 2017-10-21 DIAGNOSIS — J45909 Unspecified asthma, uncomplicated: Secondary | ICD-10-CM

## 2017-10-21 DIAGNOSIS — R791 Abnormal coagulation profile: Secondary | ICD-10-CM | POA: Diagnosis not present

## 2017-10-21 DIAGNOSIS — M1990 Unspecified osteoarthritis, unspecified site: Secondary | ICD-10-CM

## 2017-10-21 DIAGNOSIS — Z79899 Other long term (current) drug therapy: Secondary | ICD-10-CM

## 2017-10-21 DIAGNOSIS — I700 Atherosclerosis of aorta: Secondary | ICD-10-CM

## 2017-10-21 DIAGNOSIS — Z801 Family history of malignant neoplasm of trachea, bronchus and lung: Secondary | ICD-10-CM

## 2017-10-21 DIAGNOSIS — D649 Anemia, unspecified: Secondary | ICD-10-CM | POA: Diagnosis not present

## 2017-10-21 DIAGNOSIS — K6289 Other specified diseases of anus and rectum: Secondary | ICD-10-CM | POA: Diagnosis not present

## 2017-10-21 DIAGNOSIS — Z90722 Acquired absence of ovaries, bilateral: Secondary | ICD-10-CM

## 2017-10-21 DIAGNOSIS — Z90710 Acquired absence of both cervix and uterus: Secondary | ICD-10-CM | POA: Diagnosis not present

## 2017-10-21 DIAGNOSIS — K219 Gastro-esophageal reflux disease without esophagitis: Secondary | ICD-10-CM

## 2017-10-21 DIAGNOSIS — C569 Malignant neoplasm of unspecified ovary: Secondary | ICD-10-CM

## 2018-01-23 NOTE — Progress Notes (Signed)
Tamara Mckinney  Telephone:(336) [phone] Fax:(336) [phone]  ID: Tamara Mckinney OB: 1928-04-04  MR#: [id number]  MLY#:[id number]  Patient Care Team: [name], MD as PCP - General (Family Medicine) [name], RN as Registered Nurse Herbert Pun, MD as Consulting Physician (General Surgery)  CHIEF COMPLAINT: Stage IIIC ovarian cancer.  INTERVAL HISTORY: Patient returns to clinic today for repeat laboratory work and further evaluation.  She currently feels well and is asymptomatic. She has no neurologic complaints.  She does not complain of abdominal pain or bloating.  She has a good appetite and denies weight loss.  She denies any recent fevers or illnesses.  She denies any chest pain or shortness of breath. She has no nausea, vomiting, constipation, or diarrhea. She has no urinary complaints.  Patient feels at her baseline offers no specific complaints today.  REVIEW OF SYSTEMS:   Review of Systems  Constitutional: Negative.  Negative for fever, malaise/fatigue and weight loss.  Respiratory: Negative.  Negative for cough and shortness of breath.   Cardiovascular: Negative.  Negative for chest pain and leg swelling.  Gastrointestinal: Negative for abdominal pain, blood in stool, constipation, diarrhea, heartburn, melena, nausea and vomiting.  Genitourinary: Negative.   Musculoskeletal: Negative.   Skin: Negative.  Negative for rash.  Neurological: Negative.  Negative for dizziness and weakness.  Endo/Heme/Allergies: Does not bruise/bleed easily.  Psychiatric/Behavioral: Negative.  The patient is not nervous/anxious.     As per HPI. Otherwise, a complete review of systems is negative.  PAST MEDICAL HISTORY: Past Medical History:  Diagnosis Date  . Anemia   . Arthritis   . Asthma   . Cancer ([place])   . Dyspnea   . GERD (gastroesophageal reflux disease)   . Hypertension     PAST SURGICAL HISTORY: Past Surgical History:  Procedure  Laterality Date  . ABDOMINAL HYSTERECTOMY    . BREAST BIOPSY     x6  . BUNIONECTOMY Bilateral   . CATARACT EXTRACTION, BILATERAL    . FLEXIBLE SIGMOIDOSCOPY N/A 05/21/2017   Procedure: FLEXIBLE SIGMOIDOSCOPY;  Surgeon: Lin Landsman, MD;  [facility] ENDOSCOPY;  Service: Gastroenterology;  Laterality: N/A;  . INCONTINENCE SURGERY    . PORTA CATH INSERTION N/A 06/16/2017   Procedure: PORTA CATH INSERTION;  Surgeon: Algernon Huxley, MD;  Location: [place] CV LAB;  Service: Cardiovascular;  Laterality: N/A;  . RECTAL SURGERY  04/2018  . REPAIR OF RECTAL PROLAPSE N/A 05/11/2017   Procedure: REPAIR OF RECTAL PROLAPSE;  Surgeon: Leonie Green, MD;  Location: [facility] ORS;  Service: General;  Laterality: N/A;  . TONSILLECTOMY    . VAGINA SURGERY     uncertain procedure performed    FAMILY HISTORY: Family History  Problem Relation Age of Onset  . Heart disease Mother   . Heart disease Father   . Heart disease Sister   . Heart attack Sister   . Ulcerative colitis Brother   . Lung cancer Brother   . Thyroid cancer Sister   . Asthma Sister   . Diabetes Sister   . Asthma Sister   . Pancreatic cancer Sister   . Dementia Sister   . Asthma Brother   . Heart disease Brother   . Asthma Brother   . Lung cancer Brother   . Asthma Brother   . Lung cancer Brother   . Lung cancer Brother   . Rheum arthritis Brother     ADVANCED DIRECTIVES (Y/N):  N  HEALTH MAINTENANCE: Social History  Tobacco Use  . Smoking status: Never Smoker  . Smokeless tobacco: Never Used  Substance Use Topics  . Alcohol use: No  . Drug use: No     Colonoscopy:  PAP:  Bone density:  Lipid panel:  No Known Allergies  Current Outpatient Medications  Medication Sig Dispense Refill  . acetaminophen (TYLENOL) 500 MG tablet Take 500 mg by mouth 2 (two) times daily as needed for moderate pain.     Marland Kitchen albuterol (PROVENTIL HFA;VENTOLIN HFA) 108 (90 Base) MCG/ACT inhaler Inhale 1-2 puffs into the  lungs every 6 (six) hours as needed for wheezing or shortness of breath.    Marland Kitchen amLODipine (NORVASC) 5 MG tablet Take 5 mg by mouth daily with breakfast.    . Ascorbic Acid (VITAMIN C) 1000 MG tablet Take 1,000 mg by mouth daily.    . Calcium Carb-Cholecalciferol (CALTRATE 600+D) 600-800 MG-UNIT TABS Take 1 tablet by mouth daily.    . Carboxymeth-Glyc-Polysorb PF (REFRESH OPTIVE ADVANCED PF) 0.5-1-0.5 % SOLN Place 1-2 drops into both eyes 3 (three) times daily as needed. For dry, irritated eyes    . Carboxymethylcellulose Sod PF (REFRESH CELLUVISC) 1 % GEL Place 1 drop into both eyes at bedtime. In the middle of the night     . cetirizine (ZYRTEC) 10 MG tablet Take 10 mg by mouth at bedtime as needed for allergies.    . Cholecalciferol (VITAMIN D3) 2000 units capsule Take 2,000 Units by mouth daily.    . Cyanocobalamin (RA VITAMIN B12) 2000 MCG TBCR Take 2,000 mcg by mouth daily.    . cyclobenzaprine (FLEXERIL) 10 MG tablet Take 1 tablet (10 mg total) by mouth at bedtime. 30 tablet 2  . docusate sodium (COLACE) 100 MG capsule Take 100-300 mg by mouth at bedtime as needed for mild constipation.    Marland Kitchen esomeprazole (NEXIUM) 40 MG capsule Take 40 mg by mouth daily before breakfast.     . fluticasone furoate-vilanterol (BREO ELLIPTA) 100-25 MCG/INH AEPB Inhale 1 puff into the lungs as needed.     . furosemide (LASIX) 40 MG tablet Take 40 mg by mouth as needed.     Marland Kitchen ipratropium-albuterol (DUONEB) 0.5-2.5 (3) MG/3ML SOLN Take 3 mLs by nebulization every 6 (six) hours as needed. For wheezing/shortness of breath    . lidocaine-prilocaine (EMLA) cream Apply 1 application topically as needed. Apply small amount to port site at least 1 hour prior to it being accessed, cover with plastic wrap 30 g 1  . meclizine (ANTIVERT) 25 MG tablet Take 25 mg by mouth 3 (three) times daily as needed (for vertigo).     . montelukast (SINGULAIR) 10 MG tablet Take 10 mg by mouth at bedtime.     No current facility-administered  medications for this visit.     OBJECTIVE: Vitals:   01/27/18 1458  BP: 137/85  Pulse: 89  Resp: 20  Temp: (!) 97.4 F (36.3 C)     Body mass index is 21.08 kg/m.    ECOG FS:0 - Asymptomatic  General: Well-developed, well-nourished, no acute distress. Eyes: Pink conjunctiva, anicteric sclera. Lungs: Clear to auscultation bilaterally. Heart: Regular rate and rhythm. No rubs, murmurs, or gallops. Abdomen: Soft, nontender, nondistended. No organomegaly noted, normoactive bowel sounds. Musculoskeletal: No edema, cyanosis, or clubbing. Neuro: Alert, answering all questions appropriately. Cranial nerves grossly intact. Skin: No rashes or petechiae noted. Psych: Normal affect.    LAB RESULTS:  Lab Results  Component Value Date   NA 136 01/25/2018   K 3.9  01/25/2018   CL 102 01/25/2018   CO2 25 01/25/2018   GLUCOSE 160 (H) 01/25/2018   BUN 28 (H) 01/25/2018   CREATININE 1.04 (H) 01/25/2018   CALCIUM 8.7 (L) 01/25/2018   PROT 6.3 (L) 05/19/2017   ALBUMIN 2.6 (L) 05/19/2017   AST 29 05/19/2017   ALT 13 (L) 05/19/2017   ALKPHOS 77 05/19/2017   BILITOT 0.5 05/19/2017   GFRNONAA 47 (L) 01/25/2018   GFRAA 54 (L) 01/25/2018    Lab Results  Component Value Date   WBC 4.4 01/25/2018   NEUTROABS 2.8 01/25/2018   HGB 11.2 (L) 01/25/2018   HCT 32.9 (L) 01/25/2018   MCV 97.4 01/25/2018   PLT 219 01/25/2018     STUDIES: No results found.  ASSESSMENT: Stage IIIC ovarian cancer.  PLAN:    1. Stage IIIC ovarian cancer: Given patient's advanced age she was not a surgical candidate. CT scan results from October 19, 2017 reviewed independently with significant response to therapy and no new or progressive disease noted.  Patient's CA 125 continues to be within normal limits at 18.1.  She last received treatment with single agent carboplatinum on September 23, 2017. Although, combination therapy is recommended, given patient's advanced age, the toxicity was thought to be too high.  No  intervention is needed at this time.  Patient can now be followed with serial CA 125.  Additional imaging will only be necessary if there is suspicion of recurrence.  Return to clinic in 3 months with repeat laboratory work and further evaluation. 2. Lupus anticoagulant: Patient was noted to have an elevated PTT as well as increased bleeding during her surgery for rectal prolapse. Continue to monitor closely and repeat lupus anticoagulant panel if patient has evidence of bleeding or requires additional procedure.   3. Anemia: Patient's hemoglobin continues to improve and is now within normal limits.  She received 2 doses of IV Feraheme in October 2018.  4. Chronic renal insufficiency: Patient's creatinine is now within normal limits. 5.  Rectal pain: Patient does not complain of this today.    Patient expressed understanding and was in agreement with this plan. She also understands that She can call clinic at any time with any questions, concerns, or complaints.   Cancer Staging Malignant neoplasm of ovary [facility]) Staging form: Ovary, Fallopian Tube, and Primary Peritoneal Carcinoma, AJCC 8th Edition - Clinical stage from 05/29/2017: Stage IIIC (cT3c, cN1b, cM0) - Signed by Lloyd Huger, MD on 05/29/2017   Lloyd Huger, MD   01/31/2018 1:28 PM

## 2018-01-25 ENCOUNTER — Inpatient Hospital Stay: Payer: Medicare Other | Attending: Oncology

## 2018-01-25 DIAGNOSIS — D6862 Lupus anticoagulant syndrome: Secondary | ICD-10-CM | POA: Insufficient documentation

## 2018-01-25 DIAGNOSIS — C569 Malignant neoplasm of unspecified ovary: Secondary | ICD-10-CM | POA: Diagnosis not present

## 2018-01-25 DIAGNOSIS — Z90710 Acquired absence of both cervix and uterus: Secondary | ICD-10-CM | POA: Diagnosis not present

## 2018-01-25 DIAGNOSIS — Z90722 Acquired absence of ovaries, bilateral: Secondary | ICD-10-CM | POA: Diagnosis not present

## 2018-01-25 DIAGNOSIS — N189 Chronic kidney disease, unspecified: Secondary | ICD-10-CM | POA: Insufficient documentation

## 2018-01-25 DIAGNOSIS — M1990 Unspecified osteoarthritis, unspecified site: Secondary | ICD-10-CM | POA: Diagnosis not present

## 2018-01-25 DIAGNOSIS — R791 Abnormal coagulation profile: Secondary | ICD-10-CM | POA: Insufficient documentation

## 2018-01-25 DIAGNOSIS — Z79899 Other long term (current) drug therapy: Secondary | ICD-10-CM | POA: Diagnosis not present

## 2018-01-25 DIAGNOSIS — I129 Hypertensive chronic kidney disease with stage 1 through stage 4 chronic kidney disease, or unspecified chronic kidney disease: Secondary | ICD-10-CM | POA: Insufficient documentation

## 2018-01-25 DIAGNOSIS — Z808 Family history of malignant neoplasm of other organs or systems: Secondary | ICD-10-CM | POA: Insufficient documentation

## 2018-01-25 DIAGNOSIS — D631 Anemia in chronic kidney disease: Secondary | ICD-10-CM | POA: Diagnosis not present

## 2018-01-25 DIAGNOSIS — K219 Gastro-esophageal reflux disease without esophagitis: Secondary | ICD-10-CM | POA: Diagnosis not present

## 2018-01-25 DIAGNOSIS — Z801 Family history of malignant neoplasm of trachea, bronchus and lung: Secondary | ICD-10-CM | POA: Diagnosis not present

## 2018-01-25 LAB — CBC WITH DIFFERENTIAL/PLATELET
Basophils Absolute: 0 10*3/uL (ref 0–0.1)
Basophils Relative: 1 %
Eosinophils Absolute: 0.1 10*3/uL (ref 0–0.7)
Eosinophils Relative: 2 %
HCT: 32.9 % — ABNORMAL LOW (ref 35.0–47.0)
Hemoglobin: 11.2 g/dL — ABNORMAL LOW (ref 12.0–16.0)
Lymphocytes Relative: 25 %
Lymphs Abs: 1.1 10*3/uL (ref 1.0–3.6)
MCH: 33.1 pg (ref 26.0–34.0)
MCHC: 34 g/dL (ref 32.0–36.0)
MCV: 97.4 fL (ref 80.0–100.0)
Monocytes Absolute: 0.3 10*3/uL (ref 0.2–0.9)
Monocytes Relative: 8 %
Neutro Abs: 2.8 10*3/uL (ref 1.4–6.5)
Neutrophils Relative %: 64 %
Platelets: 219 10*3/uL (ref 150–440)
RBC: 3.38 MIL/uL — ABNORMAL LOW (ref 3.80–5.20)
RDW: 12.7 % (ref 11.5–14.5)
WBC: 4.4 10*3/uL (ref 3.6–11.0)

## 2018-01-25 LAB — BASIC METABOLIC PANEL
Anion gap: 9 (ref 5–15)
BUN: 28 mg/dL — ABNORMAL HIGH (ref 6–20)
CO2: 25 mmol/L (ref 22–32)
Calcium: 8.7 mg/dL — ABNORMAL LOW (ref 8.9–10.3)
Chloride: 102 mmol/L (ref 101–111)
Creatinine, Ser: 1.04 mg/dL — ABNORMAL HIGH (ref 0.44–1.00)
GFR calc Af Amer: 54 mL/min — ABNORMAL LOW (ref >60)
GFR calc non Af Amer: 47 mL/min — ABNORMAL LOW (ref >60)
Glucose, Bld: 160 mg/dL — ABNORMAL HIGH (ref 65–99)
Potassium: 3.9 mmol/L (ref 3.5–5.1)
Sodium: 136 mmol/L (ref 135–145)

## 2018-01-26 LAB — CA 125: Cancer Antigen (CA) 125: 18.1 U/mL (ref 0.0–38.1)

## 2018-01-27 ENCOUNTER — Encounter: Payer: Self-pay | Admitting: Oncology

## 2018-01-27 ENCOUNTER — Inpatient Hospital Stay (HOSPITAL_BASED_OUTPATIENT_CLINIC_OR_DEPARTMENT_OTHER): Payer: Medicare Other | Admitting: Oncology

## 2018-01-27 VITALS — BP 137/85 | HR 89 | Temp 97.4°F | Resp 20 | Wt 97.4 lb

## 2018-01-27 DIAGNOSIS — D6862 Lupus anticoagulant syndrome: Secondary | ICD-10-CM

## 2018-01-27 DIAGNOSIS — Z90722 Acquired absence of ovaries, bilateral: Secondary | ICD-10-CM

## 2018-01-27 DIAGNOSIS — C569 Malignant neoplasm of unspecified ovary: Secondary | ICD-10-CM

## 2018-01-27 DIAGNOSIS — D631 Anemia in chronic kidney disease: Secondary | ICD-10-CM | POA: Diagnosis not present

## 2018-01-27 DIAGNOSIS — Z79899 Other long term (current) drug therapy: Secondary | ICD-10-CM

## 2018-01-27 DIAGNOSIS — K219 Gastro-esophageal reflux disease without esophagitis: Secondary | ICD-10-CM | POA: Diagnosis not present

## 2018-01-27 DIAGNOSIS — R791 Abnormal coagulation profile: Secondary | ICD-10-CM | POA: Diagnosis not present

## 2018-01-27 DIAGNOSIS — N189 Chronic kidney disease, unspecified: Secondary | ICD-10-CM

## 2018-01-27 DIAGNOSIS — Z808 Family history of malignant neoplasm of other organs or systems: Secondary | ICD-10-CM

## 2018-01-27 DIAGNOSIS — M1990 Unspecified osteoarthritis, unspecified site: Secondary | ICD-10-CM

## 2018-01-27 DIAGNOSIS — Z90710 Acquired absence of both cervix and uterus: Secondary | ICD-10-CM | POA: Diagnosis not present

## 2018-01-27 DIAGNOSIS — Z801 Family history of malignant neoplasm of trachea, bronchus and lung: Secondary | ICD-10-CM

## 2018-01-27 DIAGNOSIS — I129 Hypertensive chronic kidney disease with stage 1 through stage 4 chronic kidney disease, or unspecified chronic kidney disease: Secondary | ICD-10-CM | POA: Diagnosis not present

## 2018-01-27 NOTE — Progress Notes (Signed)
Patient denies any concerns today.  

## 2018-03-10 ENCOUNTER — Inpatient Hospital Stay: Payer: Medicare Other | Attending: Oncology

## 2018-03-10 DIAGNOSIS — Z90710 Acquired absence of both cervix and uterus: Secondary | ICD-10-CM | POA: Insufficient documentation

## 2018-03-10 DIAGNOSIS — Z452 Encounter for adjustment and management of vascular access device: Secondary | ICD-10-CM | POA: Insufficient documentation

## 2018-03-10 DIAGNOSIS — Z90722 Acquired absence of ovaries, bilateral: Secondary | ICD-10-CM | POA: Diagnosis not present

## 2018-03-10 DIAGNOSIS — C569 Malignant neoplasm of unspecified ovary: Secondary | ICD-10-CM | POA: Diagnosis not present

## 2018-03-10 MED ORDER — SODIUM CHLORIDE 0.9% FLUSH
10.0000 mL | INTRAVENOUS | Status: AC | PRN
  Administered 2018-03-10: 10 mL via INTRAVENOUS
  Filled 2018-03-10: qty 10

## 2018-03-10 MED ORDER — HEPARIN SOD (PORK) LOCK FLUSH 100 UNIT/ML IV SOLN
500.0000 [IU] | Freq: Once | INTRAVENOUS | Status: AC
  Administered 2018-03-10: 500 [IU] via INTRAVENOUS

## 2018-04-25 ENCOUNTER — Inpatient Hospital Stay: Payer: Medicare Other | Attending: Oncology

## 2018-04-25 DIAGNOSIS — Z801 Family history of malignant neoplasm of trachea, bronchus and lung: Secondary | ICD-10-CM | POA: Insufficient documentation

## 2018-04-25 DIAGNOSIS — Z79899 Other long term (current) drug therapy: Secondary | ICD-10-CM | POA: Insufficient documentation

## 2018-04-25 DIAGNOSIS — Z90710 Acquired absence of both cervix and uterus: Secondary | ICD-10-CM | POA: Insufficient documentation

## 2018-04-25 DIAGNOSIS — C569 Malignant neoplasm of unspecified ovary: Secondary | ICD-10-CM | POA: Insufficient documentation

## 2018-04-25 DIAGNOSIS — I10 Essential (primary) hypertension: Secondary | ICD-10-CM | POA: Insufficient documentation

## 2018-04-25 DIAGNOSIS — K219 Gastro-esophageal reflux disease without esophagitis: Secondary | ICD-10-CM | POA: Insufficient documentation

## 2018-04-25 DIAGNOSIS — R971 Elevated cancer antigen 125 [CA 125]: Secondary | ICD-10-CM | POA: Insufficient documentation

## 2018-04-25 DIAGNOSIS — M1990 Unspecified osteoarthritis, unspecified site: Secondary | ICD-10-CM | POA: Insufficient documentation

## 2018-04-25 DIAGNOSIS — Z5111 Encounter for antineoplastic chemotherapy: Secondary | ICD-10-CM | POA: Insufficient documentation

## 2018-04-25 DIAGNOSIS — Z800 Family history of malignant neoplasm of digestive organs: Secondary | ICD-10-CM | POA: Insufficient documentation

## 2018-04-25 DIAGNOSIS — D649 Anemia, unspecified: Secondary | ICD-10-CM | POA: Insufficient documentation

## 2018-04-25 DIAGNOSIS — Z90722 Acquired absence of ovaries, bilateral: Secondary | ICD-10-CM | POA: Insufficient documentation

## 2018-04-25 DIAGNOSIS — C786 Secondary malignant neoplasm of retroperitoneum and peritoneum: Secondary | ICD-10-CM | POA: Insufficient documentation

## 2018-04-25 DIAGNOSIS — D6862 Lupus anticoagulant syndrome: Secondary | ICD-10-CM | POA: Insufficient documentation

## 2018-04-25 DIAGNOSIS — Z9221 Personal history of antineoplastic chemotherapy: Secondary | ICD-10-CM | POA: Insufficient documentation

## 2018-04-25 DIAGNOSIS — Z452 Encounter for adjustment and management of vascular access device: Secondary | ICD-10-CM | POA: Insufficient documentation

## 2018-04-28 ENCOUNTER — Inpatient Hospital Stay: Payer: Medicare Other | Admitting: Oncology

## 2018-04-28 ENCOUNTER — Inpatient Hospital Stay: Payer: Medicare Other

## 2018-04-28 DIAGNOSIS — Z800 Family history of malignant neoplasm of digestive organs: Secondary | ICD-10-CM | POA: Diagnosis not present

## 2018-04-28 DIAGNOSIS — Z79899 Other long term (current) drug therapy: Secondary | ICD-10-CM | POA: Diagnosis not present

## 2018-04-28 DIAGNOSIS — Z452 Encounter for adjustment and management of vascular access device: Secondary | ICD-10-CM | POA: Diagnosis not present

## 2018-04-28 DIAGNOSIS — I10 Essential (primary) hypertension: Secondary | ICD-10-CM | POA: Diagnosis not present

## 2018-04-28 DIAGNOSIS — Z801 Family history of malignant neoplasm of trachea, bronchus and lung: Secondary | ICD-10-CM | POA: Diagnosis not present

## 2018-04-28 DIAGNOSIS — K219 Gastro-esophageal reflux disease without esophagitis: Secondary | ICD-10-CM | POA: Diagnosis not present

## 2018-04-28 DIAGNOSIS — Z90722 Acquired absence of ovaries, bilateral: Secondary | ICD-10-CM | POA: Diagnosis not present

## 2018-04-28 DIAGNOSIS — C786 Secondary malignant neoplasm of retroperitoneum and peritoneum: Secondary | ICD-10-CM | POA: Diagnosis not present

## 2018-04-28 DIAGNOSIS — Z9221 Personal history of antineoplastic chemotherapy: Secondary | ICD-10-CM | POA: Diagnosis not present

## 2018-04-28 DIAGNOSIS — D6862 Lupus anticoagulant syndrome: Secondary | ICD-10-CM | POA: Diagnosis not present

## 2018-04-28 DIAGNOSIS — D649 Anemia, unspecified: Secondary | ICD-10-CM | POA: Diagnosis not present

## 2018-04-28 DIAGNOSIS — M1990 Unspecified osteoarthritis, unspecified site: Secondary | ICD-10-CM | POA: Diagnosis not present

## 2018-04-28 DIAGNOSIS — C569 Malignant neoplasm of unspecified ovary: Secondary | ICD-10-CM

## 2018-04-28 DIAGNOSIS — Z5111 Encounter for antineoplastic chemotherapy: Secondary | ICD-10-CM | POA: Diagnosis not present

## 2018-04-28 DIAGNOSIS — R971 Elevated cancer antigen 125 [CA 125]: Secondary | ICD-10-CM | POA: Diagnosis not present

## 2018-04-28 DIAGNOSIS — Z90710 Acquired absence of both cervix and uterus: Secondary | ICD-10-CM | POA: Diagnosis not present

## 2018-04-28 LAB — BASIC METABOLIC PANEL
Anion gap: 7 (ref 5–15)
BUN: 19 mg/dL (ref 8–23)
CO2: 26 mmol/L (ref 22–32)
Calcium: 8.8 mg/dL — ABNORMAL LOW (ref 8.9–10.3)
Chloride: 101 mmol/L (ref 98–111)
Creatinine, Ser: 0.65 mg/dL (ref 0.44–1.00)
GFR calc Af Amer: >60 mL/min (ref >60)
GFR calc non Af Amer: >60 mL/min (ref >60)
Glucose, Bld: 74 mg/dL (ref 70–99)
Potassium: 4.3 mmol/L (ref 3.5–5.1)
Sodium: 134 mmol/L — ABNORMAL LOW (ref 135–145)

## 2018-04-28 LAB — CBC WITH DIFFERENTIAL/PLATELET
Basophils Absolute: 0 10*3/uL (ref 0–0.1)
Basophils Relative: 1 %
Eosinophils Absolute: 0.1 10*3/uL (ref 0–0.7)
Eosinophils Relative: 1 %
HCT: 30.8 % — ABNORMAL LOW (ref 35.0–47.0)
Hemoglobin: 10.2 g/dL — ABNORMAL LOW (ref 12.0–16.0)
Lymphocytes Relative: 21 %
Lymphs Abs: 1.1 10*3/uL (ref 1.0–3.6)
MCH: 31.5 pg (ref 26.0–34.0)
MCHC: 33.2 g/dL (ref 32.0–36.0)
MCV: 95.1 fL (ref 80.0–100.0)
Monocytes Absolute: 0.5 10*3/uL (ref 0.2–0.9)
Monocytes Relative: 10 %
Neutro Abs: 3.7 10*3/uL (ref 1.4–6.5)
Neutrophils Relative %: 67 %
Platelets: 244 10*3/uL (ref 150–440)
RBC: 3.25 MIL/uL — ABNORMAL LOW (ref 3.80–5.20)
RDW: 14.2 % (ref 11.5–14.5)
WBC: 5.4 10*3/uL (ref 3.6–11.0)

## 2018-04-28 MED ORDER — HEPARIN SOD (PORK) LOCK FLUSH 100 UNIT/ML IV SOLN
500.0000 [IU] | Freq: Once | INTRAVENOUS | Status: AC
  Administered 2018-04-28: 500 [IU] via INTRAVENOUS

## 2018-04-28 MED ORDER — SODIUM CHLORIDE 0.9% FLUSH
10.0000 mL | Freq: Once | INTRAVENOUS | Status: AC
  Administered 2018-04-28: 10 mL via INTRAVENOUS
  Filled 2018-04-28: qty 10

## 2018-04-29 LAB — CA 125: Cancer Antigen (CA) 125: 102 U/mL — ABNORMAL HIGH (ref 0.0–38.1)

## 2018-04-30 NOTE — Progress Notes (Signed)
[place]  [phone]) [phone] Fax:(336) [phone]  ID: Tamara Mckinney OB: 12-Jan-1928  MR#: [id number]  YTK#:[id number]  Patient Care Team: [name], MD as PCP - General (Family Medicine) [name], RN as Registered Nurse Herbert Pun, MD as Consulting Physician (General Surgery)  CHIEF COMPLAINT: Stage IIIC ovarian cancer.  INTERVAL HISTORY: Patient returns to clinic today for repeat laboratory work and further evaluation.  She continues to feel well and remains asymptomatic. She has no neurologic complaints.  She does not complain of abdominal pain or bloating.  She has a good appetite and denies weight loss.  She denies any recent fevers or illnesses.  She denies any chest pain or shortness of breath. She has no nausea, vomiting, constipation, or diarrhea. She has no urinary complaints.  Patient offers no specific complaints today.  REVIEW OF SYSTEMS:   Review of Systems  Constitutional: Negative.  Negative for fever, malaise/fatigue and weight loss.  Respiratory: Negative.  Negative for cough and shortness of breath.   Cardiovascular: Negative.  Negative for chest pain and leg swelling.  Gastrointestinal: Negative for abdominal pain, blood in stool, constipation, diarrhea, heartburn, melena, nausea and vomiting.  Genitourinary: Negative.   Musculoskeletal: Negative.   Skin: Negative.  Negative for rash.  Neurological: Negative.  Negative for dizziness and weakness.  Endo/Heme/Allergies: Does not bruise/bleed easily.  Psychiatric/Behavioral: Negative.  The patient is not nervous/anxious.     As per HPI. Otherwise, a complete review of systems is negative.  PAST MEDICAL HISTORY: Past Medical History:  Diagnosis Date  . Anemia   . Arthritis   . Asthma   . Cancer ([place])   . Dyspnea   . GERD (gastroesophageal reflux disease)   . Hypertension     PAST SURGICAL HISTORY: Past Surgical History:  Procedure Laterality Date  .  ABDOMINAL HYSTERECTOMY    . BREAST BIOPSY     x6  . BUNIONECTOMY Bilateral   . CATARACT EXTRACTION, BILATERAL    . FLEXIBLE SIGMOIDOSCOPY N/A 05/21/2017   Procedure: FLEXIBLE SIGMOIDOSCOPY;  Surgeon: Lin Landsman, MD;  [facility] ENDOSCOPY;  Service: Gastroenterology;  Laterality: N/A;  . INCONTINENCE SURGERY    . PORTA CATH INSERTION N/A 06/16/2017   Procedure: PORTA CATH INSERTION;  Surgeon: Algernon Huxley, MD;  Location: [place] CV LAB;  Service: Cardiovascular;  Laterality: N/A;  . RECTAL SURGERY  04/2018  . REPAIR OF RECTAL PROLAPSE N/A 05/11/2017   Procedure: REPAIR OF RECTAL PROLAPSE;  Surgeon: Leonie Green, MD;  Location: [facility] ORS;  Service: General;  Laterality: N/A;  . TONSILLECTOMY    . VAGINA SURGERY     uncertain procedure performed    FAMILY HISTORY: Family History  Problem Relation Age of Onset  . Heart disease Mother   . Heart disease Father   . Heart disease Sister   . Heart attack Sister   . Ulcerative colitis Brother   . Lung cancer Brother   . Thyroid cancer Sister   . Asthma Sister   . Diabetes Sister   . Asthma Sister   . Pancreatic cancer Sister   . Dementia Sister   . Asthma Brother   . Heart disease Brother   . Asthma Brother   . Lung cancer Brother   . Asthma Brother   . Lung cancer Brother   . Lung cancer Brother   . Rheum arthritis Brother     ADVANCED DIRECTIVES (Y/N):  N  HEALTH MAINTENANCE: Social History   Tobacco Use  .  Smoking status: Never Smoker  . Smokeless tobacco: Never Used  Substance Use Topics  . Alcohol use: No  . Drug use: No     Colonoscopy:  PAP:  Bone density:  Lipid panel:  No Known Allergies  Current Outpatient Medications  Medication Sig Dispense Refill  . acetaminophen (TYLENOL) 500 MG tablet Take 500 mg by mouth 2 (two) times daily as needed for moderate pain.     Marland Kitchen albuterol (PROVENTIL HFA;VENTOLIN HFA) 108 (90 Base) MCG/ACT inhaler Inhale 1-2 puffs into the lungs every 6 (six)  hours as needed for wheezing or shortness of breath.    Marland Kitchen amLODipine (NORVASC) 5 MG tablet Take 5 mg by mouth daily with breakfast.    . Carboxymeth-Glyc-Polysorb PF (REFRESH OPTIVE ADVANCED PF) 0.5-1-0.5 % SOLN Place 1-2 drops into both eyes 3 (three) times daily as needed. For dry, irritated eyes    . Carboxymethylcellulose Sod PF (REFRESH CELLUVISC) 1 % GEL Place 1 drop into both eyes at bedtime. In the middle of the night     . cetirizine (ZYRTEC) 10 MG tablet Take 10 mg by mouth at bedtime as needed for allergies.    . cyclobenzaprine (FLEXERIL) 10 MG tablet Take 1 tablet (10 mg total) by mouth at bedtime. 30 tablet 2  . docusate sodium (COLACE) 100 MG capsule Take 100-300 mg by mouth at bedtime as needed for mild constipation.    Marland Kitchen esomeprazole (NEXIUM) 40 MG capsule Take 40 mg by mouth daily before breakfast.     . fluticasone furoate-vilanterol (BREO ELLIPTA) 100-25 MCG/INH AEPB Inhale 1 puff into the lungs as needed.     . furosemide (LASIX) 40 MG tablet Take 40 mg by mouth as needed.     Marland Kitchen ipratropium-albuterol (DUONEB) 0.5-2.5 (3) MG/3ML SOLN Take 3 mLs by nebulization every 6 (six) hours as needed. For wheezing/shortness of breath    . lidocaine-prilocaine (EMLA) cream Apply 1 application topically as needed. Apply small amount to port site at least 1 hour prior to it being accessed, cover with plastic wrap 30 g 1  . meclizine (ANTIVERT) 25 MG tablet Take 25 mg by mouth 3 (three) times daily as needed (for vertigo).     . montelukast (SINGULAIR) 10 MG tablet Take 10 mg by mouth at bedtime.    . Ascorbic Acid (VITAMIN C) 1000 MG tablet Take 1,000 mg by mouth daily.    . Calcium Carb-Cholecalciferol (CALTRATE 600+D) 600-800 MG-UNIT TABS Take 1 tablet by mouth daily.    . Cholecalciferol (VITAMIN D3) 2000 units capsule Take 2,000 Units by mouth daily.    . Cyanocobalamin (RA VITAMIN B12) 2000 MCG TBCR Take 2,000 mcg by mouth daily.     No current facility-administered medications for this  visit.    Facility-Administered Medications Ordered in Other Visits  Medication Dose Route Frequency Provider Last Rate Last Dose  . sodium chloride flush (NS) 0.9 % injection 10 mL  10 mL Intravenous PRN Lloyd Huger, MD   10 mL at 03/10/18 1200    OBJECTIVE: Vitals:   05/03/18 1004  BP: 140/77  Pulse: 84  Resp: 18  Temp: (!) 97.2 F (36.2 C)     Body mass index is 21.92 kg/m.    ECOG FS:0 - Asymptomatic  General: Thin, no acute distress. Eyes: Pink conjunctiva, anicteric sclera. HEENT: Normocephalic, moist mucous membranes. Lungs: Clear to auscultation bilaterally. Heart: Regular rate and rhythm. No rubs, murmurs, or gallops. Abdomen: Soft, nontender, nondistended. No organomegaly noted, normoactive bowel sounds. Musculoskeletal:  No edema, cyanosis, or clubbing. Neuro: Alert, answering all questions appropriately. Cranial nerves grossly intact. Skin: No rashes or petechiae noted. Psych: Normal affect.    LAB RESULTS:  Lab Results  Component Value Date   NA 134 (L) 04/28/2018   K 4.3 04/28/2018   CL 101 04/28/2018   CO2 26 04/28/2018   GLUCOSE 74 04/28/2018   BUN 19 04/28/2018   CREATININE 0.65 04/28/2018   CALCIUM 8.8 (L) 04/28/2018   PROT 6.3 (L) 05/19/2017   ALBUMIN 2.6 (L) 05/19/2017   AST 29 05/19/2017   ALT 13 (L) 05/19/2017   ALKPHOS 77 05/19/2017   BILITOT 0.5 05/19/2017   GFRNONAA >60 04/28/2018   GFRAA >60 04/28/2018    Lab Results  Component Value Date   WBC 5.4 04/28/2018   NEUTROABS 3.7 04/28/2018   HGB 10.2 (L) 04/28/2018   HCT 30.8 (L) 04/28/2018   MCV 95.1 04/28/2018   PLT 244 04/28/2018     STUDIES: No results found.  ASSESSMENT: Stage IIIC ovarian cancer.  PLAN:    1. Stage IIIC ovarian cancer: Given patient's advanced age she was not a surgical candidate. CT scan results from October 19, 2017 reviewed independently with significant response to therapy and no new or progressive disease noted.  Patient CA-125 has trended up  significantly and is now greater than 100.  She last received treatment with single agent carboplatinum on September 23, 2017. Although, combination therapy is recommended, given patient's advanced age, the toxicity was thought to be too high.  Patient likely has a recurrence, therefore will get a CT scan of the abdomen and pelvis to assess a new baseline.  Patient will then return to clinic in 1 week to reinitiate single agent carboplatinum.  Patient will also require OnPro Neulasta given her history of neutropenia with previous chemotherapy. 2. Lupus anticoagulant: Patient was noted to have an elevated PTT as well as increased bleeding during her surgery for rectal prolapse. Continue to monitor closely and repeat lupus anticoagulant panel if patient has evidence of bleeding or requires additional procedure.   3. Anemia: Patient's hemoglobin is trended down and is now 10.2.  Monitor.  She last received IV Feraheme in October 2018.  4. Chronic renal insufficiency: Resolved. 5.  Rectal pain: Patient does not complain of this today.    I spent a total of 30 minutes face-to-face with the patient of which greater than 50% of the visit was spent in counseling and coordination of care as detailed above.   Patient expressed understanding and was in agreement with this plan. She also understands that She can call clinic at any time with any questions, concerns, or complaints.   Cancer Staging Malignant neoplasm of ovary [facility]) Staging form: Ovary, Fallopian Tube, and Primary Peritoneal Carcinoma, AJCC 8th Edition - Clinical stage from 05/29/2017: Stage IIIC (cT3c, cN1b, cM0) - Signed by Lloyd Huger, MD on 05/29/2017   Lloyd Huger, MD   05/03/2018 1:09 PM

## 2018-05-03 ENCOUNTER — Encounter: Payer: Self-pay | Admitting: Oncology

## 2018-05-03 ENCOUNTER — Inpatient Hospital Stay (HOSPITAL_BASED_OUTPATIENT_CLINIC_OR_DEPARTMENT_OTHER): Payer: Medicare Other | Admitting: Oncology

## 2018-05-03 VITALS — BP 140/77 | HR 84 | Temp 97.2°F | Resp 18 | Wt 101.3 lb

## 2018-05-03 DIAGNOSIS — C569 Malignant neoplasm of unspecified ovary: Secondary | ICD-10-CM | POA: Diagnosis not present

## 2018-05-03 DIAGNOSIS — Z79899 Other long term (current) drug therapy: Secondary | ICD-10-CM

## 2018-05-03 DIAGNOSIS — Z90722 Acquired absence of ovaries, bilateral: Secondary | ICD-10-CM | POA: Diagnosis not present

## 2018-05-03 DIAGNOSIS — Z90710 Acquired absence of both cervix and uterus: Secondary | ICD-10-CM

## 2018-05-03 DIAGNOSIS — I10 Essential (primary) hypertension: Secondary | ICD-10-CM

## 2018-05-03 DIAGNOSIS — Z9221 Personal history of antineoplastic chemotherapy: Secondary | ICD-10-CM

## 2018-05-03 DIAGNOSIS — K219 Gastro-esophageal reflux disease without esophagitis: Secondary | ICD-10-CM

## 2018-05-03 DIAGNOSIS — Z800 Family history of malignant neoplasm of digestive organs: Secondary | ICD-10-CM

## 2018-05-03 DIAGNOSIS — M1990 Unspecified osteoarthritis, unspecified site: Secondary | ICD-10-CM

## 2018-05-03 DIAGNOSIS — Z801 Family history of malignant neoplasm of trachea, bronchus and lung: Secondary | ICD-10-CM

## 2018-05-03 DIAGNOSIS — D649 Anemia, unspecified: Secondary | ICD-10-CM

## 2018-05-03 DIAGNOSIS — D6862 Lupus anticoagulant syndrome: Secondary | ICD-10-CM

## 2018-05-03 NOTE — Progress Notes (Signed)
Reports having a "cold for last week".  Reports sputum yellow in color.

## 2018-05-06 ENCOUNTER — Ambulatory Visit
Admission: RE | Admit: 2018-05-06 | Discharge: 2018-05-06 | Disposition: A | Discharge status: Home / Self Care | Payer: Medicare Other | Source: Ambulatory Visit | Attending: Oncology | Admitting: Oncology

## 2018-05-06 DIAGNOSIS — C569 Malignant neoplasm of unspecified ovary: Secondary | ICD-10-CM | POA: Diagnosis not present

## 2018-05-06 DIAGNOSIS — C786 Secondary malignant neoplasm of retroperitoneum and peritoneum: Secondary | ICD-10-CM | POA: Diagnosis not present

## 2018-05-06 DIAGNOSIS — N8110 Cystocele, unspecified: Secondary | ICD-10-CM | POA: Insufficient documentation

## 2018-05-06 DIAGNOSIS — I700 Atherosclerosis of aorta: Secondary | ICD-10-CM | POA: Diagnosis not present

## 2018-05-06 MED ORDER — IOHEXOL 300 MG/ML  SOLN
75.0000 mL | Freq: Once | INTRAMUSCULAR | Status: AC | PRN
  Administered 2018-05-06: 75 mL via INTRAVENOUS

## 2018-05-08 NOTE — Progress Notes (Signed)
[place]  [phone]) [phone] Fax:(336) [phone]  ID: Tamara Mckinney OB: [birth year]  MR#: [id number]  NID#:[id number]  Patient Care Team: [name], MD as PCP - General (Family Medicine) [name], RN as Registered Nurse Herbert Pun, MD as Consulting Physician (General Surgery)  CHIEF COMPLAINT: Stage IIIC ovarian cancer.  INTERVAL HISTORY: Patient returns to clinic today for repeat laboratory work, further evaluation, and initiation of cycle 1 of carboplatinum only for her recurrent ovarian cancer.  She currently feels well and is asymptomatic. She has no neurologic complaints.  She does not complain of abdominal pain or bloating.  She has a good appetite and denies weight loss.  She denies any recent fevers or illnesses.  She denies any chest pain or shortness of breath. She has no nausea, vomiting, constipation, or diarrhea. She has no urinary complaints.  Patient feels at her baseline offers no specific complaints today.  REVIEW OF SYSTEMS:   Review of Systems  Constitutional: Negative.  Negative for fever, malaise/fatigue and weight loss.  Respiratory: Negative.  Negative for cough and shortness of breath.   Cardiovascular: Negative.  Negative for chest pain and leg swelling.  Gastrointestinal: Negative for abdominal pain, blood in stool, constipation, diarrhea, heartburn, melena, nausea and vomiting.  Genitourinary: Negative.   Musculoskeletal: Negative.   Skin: Negative.  Negative for rash.  Neurological: Negative.  Negative for dizziness and weakness.  Endo/Heme/Allergies: Does not bruise/bleed easily.  Psychiatric/Behavioral: Negative.  The patient is not nervous/anxious.     As per HPI. Otherwise, a complete review of systems is negative.  PAST MEDICAL HISTORY: Past Medical History:  Diagnosis Date  . Anemia   . Arthritis   . Asthma   . Cancer ([place])   . Dyspnea   . GERD (gastroesophageal reflux disease)   .  Hypertension     PAST SURGICAL HISTORY: Past Surgical History:  Procedure Laterality Date  . ABDOMINAL HYSTERECTOMY    . BREAST BIOPSY     x6  . BUNIONECTOMY Bilateral   . CATARACT EXTRACTION, BILATERAL    . FLEXIBLE SIGMOIDOSCOPY N/A 05/21/2017   Procedure: FLEXIBLE SIGMOIDOSCOPY;  Surgeon: Lin Landsman, MD;  Location: [facility] ENDOSCOPY;  Service: Gastroenterology;  Laterality: N/A;  . INCONTINENCE SURGERY    . PORTA CATH INSERTION N/A 06/16/2017   Procedure: PORTA CATH INSERTION;  Surgeon: Algernon Huxley, MD;  Location: [place] CV LAB;  Service: Cardiovascular;  Laterality: N/A;  . RECTAL SURGERY  04/2018  . REPAIR OF RECTAL PROLAPSE N/A 05/11/2017   Procedure: REPAIR OF RECTAL PROLAPSE;  Surgeon: Leonie Green, MD;  Location: [facility] ORS;  Service: General;  Laterality: N/A;  . TONSILLECTOMY    . VAGINA SURGERY     uncertain procedure performed    FAMILY HISTORY: Family History  Problem Relation Age of Onset  . Heart disease Mother   . Heart disease Father   . Heart disease Sister   . Heart attack Sister   . Ulcerative colitis Brother   . Lung cancer Brother   . Thyroid cancer Sister   . Asthma Sister   . Diabetes Sister   . Asthma Sister   . Pancreatic cancer Sister   . Dementia Sister   . Asthma Brother   . Heart disease Brother   . Asthma Brother   . Lung cancer Brother   . Asthma Brother   . Lung cancer Brother   . Lung cancer Brother   . Rheum arthritis Brother  ADVANCED DIRECTIVES (Y/N):  N  HEALTH MAINTENANCE: Social History   Tobacco Use  . Smoking status: Never Smoker  . Smokeless tobacco: Never Used  Substance Use Topics  . Alcohol use: No  . Drug use: No     Colonoscopy:  PAP:  Bone density:  Lipid panel:  No Known Allergies  Current Outpatient Medications  Medication Sig Dispense Refill  . acetaminophen (TYLENOL) 500 MG tablet Take 500 mg by mouth 2 (two) times daily as needed for moderate pain.     Marland Kitchen albuterol  (PROVENTIL HFA;VENTOLIN HFA) 108 (90 Base) MCG/ACT inhaler Inhale 1-2 puffs into the lungs every 6 (six) hours as needed for wheezing or shortness of breath.    . Ascorbic Acid (VITAMIN C) 1000 MG tablet Take 1,000 mg by mouth daily.    . Calcium Carb-Cholecalciferol (CALTRATE 600+D) 600-800 MG-UNIT TABS Take 1 tablet by mouth daily.    . Carboxymeth-Glyc-Polysorb PF (REFRESH OPTIVE ADVANCED PF) 0.5-1-0.5 % SOLN Place 1-2 drops into both eyes 3 (three) times daily as needed. For dry, irritated eyes    . Carboxymethylcellulose Sod PF (REFRESH CELLUVISC) 1 % GEL Place 1 drop into both eyes at bedtime. In the middle of the night     . cetirizine (ZYRTEC) 10 MG tablet Take 10 mg by mouth at bedtime as needed for allergies.    . Cholecalciferol (VITAMIN D3) 2000 units capsule Take 2,000 Units by mouth daily.    Marland Kitchen docusate sodium (COLACE) 100 MG capsule Take 100-300 mg by mouth at bedtime as needed for mild constipation.    Marland Kitchen esomeprazole (NEXIUM) 40 MG capsule Take 40 mg by mouth daily before breakfast.     . fluticasone furoate-vilanterol (BREO ELLIPTA) 100-25 MCG/INH AEPB Inhale 1 puff into the lungs as needed.     . furosemide (LASIX) 40 MG tablet Take 40 mg by mouth as needed.     . meclizine (ANTIVERT) 25 MG tablet Take 25 mg by mouth 3 (three) times daily as needed (for vertigo).     . montelukast (SINGULAIR) 10 MG tablet Take 10 mg by mouth at bedtime.    Marland Kitchen amLODipine (NORVASC) 5 MG tablet Take 5 mg by mouth daily as needed.     . Cyanocobalamin (RA VITAMIN B12) 2000 MCG TBCR Take 2,000 mcg by mouth daily.    . cyclobenzaprine (FLEXERIL) 10 MG tablet Take 1 tablet (10 mg total) by mouth at bedtime. (Patient not taking: Reported on 05/10/2018) 30 tablet 2  . ipratropium-albuterol (DUONEB) 0.5-2.5 (3) MG/3ML SOLN Take 3 mLs by nebulization every 6 (six) hours as needed. For wheezing/shortness of breath    . lidocaine-prilocaine (EMLA) cream Apply 1 application topically as needed. Apply small amount  to port site at least 1 hour prior to it being accessed, cover with plastic wrap (Patient not taking: Reported on 05/10/2018) 30 g 1   No current facility-administered medications for this visit.    Facility-Administered Medications Ordered in Other Visits  Medication Dose Route Frequency Provider Last Rate Last Dose  . sodium chloride flush (NS) 0.9 % injection 10 mL  10 mL Intravenous PRN Lloyd Huger, MD   10 mL at 03/10/18 1200    OBJECTIVE: Vitals:   05/10/18 0908  BP: (!) 159/81  Pulse: 82  Resp: 18  Temp: (!) 96.9 F (36.1 C)     Body mass index is 21.81 kg/m.    ECOG FS:0 - Asymptomatic  General: Well-developed, well-nourished, no acute distress. Eyes: Pink conjunctiva, anicteric sclera.  HEENT: Normocephalic, moist mucous membranes. Lungs: Clear to auscultation bilaterally. Heart: Regular rate and rhythm. No rubs, murmurs, or gallops. Abdomen: Soft, nontender, nondistended. No organomegaly noted, normoactive bowel sounds. Musculoskeletal: No edema, cyanosis, or clubbing. Neuro: Alert, answering all questions appropriately. Cranial nerves grossly intact. Skin: No rashes or petechiae noted. Psych: Normal affect.  LAB RESULTS:  Lab Results  Component Value Date   NA 135 05/10/2018   K 4.2 05/10/2018   CL 102 05/10/2018   CO2 25 05/10/2018   GLUCOSE 97 05/10/2018   BUN 23 05/10/2018   CREATININE 0.80 05/10/2018   CALCIUM 9.0 05/10/2018   PROT 6.9 05/10/2018   ALBUMIN 4.0 05/10/2018   AST 31 05/10/2018   ALT 19 05/10/2018   ALKPHOS 83 05/10/2018   BILITOT 0.5 05/10/2018   GFRNONAA >60 05/10/2018   GFRAA >60 05/10/2018    Lab Results  Component Value Date   WBC 5.0 05/10/2018   NEUTROABS 3.2 05/10/2018   HGB 10.9 (L) 05/10/2018   HCT 31.4 (L) 05/10/2018   MCV 94.5 05/10/2018   PLT 287 05/10/2018     STUDIES: Ct Abdomen Pelvis W Contrast  Result Date: 05/06/2018 CLINICAL DATA:  Stage IIIC ovarian cancer. Rising CA 125. Last chemotherapy  February 2019. Restaging. EXAM: CT ABDOMEN AND PELVIS WITH CONTRAST TECHNIQUE: Multidetector CT imaging of the abdomen and pelvis was performed using the standard protocol following bolus administration of intravenous contrast. CONTRAST:  19mL OMNIPAQUE IOHEXOL 300 MG/ML  SOLN COMPARISON:  10/19/2017 CT abdomen/pelvis. FINDINGS: Lower chest: No significant pulmonary nodules or acute consolidative airspace disease. Coronary atherosclerosis. Hepatobiliary: Normal liver size. No liver mass. Normal gallbladder with no radiopaque cholelithiasis. No biliary ductal dilatation. Pancreas: Normal, with no mass or duct dilation. Spleen: Normal size spleen. Stable granulomatous splenic calcification. No splenic mass. Adrenals/Urinary Tract: No discrete adrenal nodule. Normal kidneys with no hydronephrosis and no renal mass. Stable small cystocele. Otherwise normal nondistended bladder. Stomach/Bowel: Moderate hiatal hernia. Otherwise normal nondistended stomach. Stable pelvic floor enterocele. No dilated small bowel loops or small bowel wall thickening. Normal appendix. Oral contrast transits to the right colon. No large bowel wall thickening. Vascular/Lymphatic: Atherosclerotic nonaneurysmal abdominal aorta. Patent portal, splenic, hepatic and renal veins. No pathologically enlarged lymph nodes in the abdomen or pelvis. Reproductive: Status post hysterectomy, with no abnormal findings at the vaginal cuff. Stable simple 2.6 cm left adnexal cyst. No right adnexal mass. Other: No pneumoperitoneum. No focal fluid collection. No ascites. New heterogeneously enhancing 5.0 x 3.0 cm left omental mass (series 2/image 33). Extracapsular 2.9 x 1.9 cm mass along the posterior right liver lobe (series 2/image 21), increased from 2.0 x 0.6 cm. Lesser omental 2.6 x 2.4 cm mass, increased from 2.0 x 1.4 cm. Subdiaphragmatic left upper quadrant 1.5 x 1.2 cm mass (series 2/image 11), increased from 0.8 x 0.7 cm. Several left upper quadrant 2.5  x 2.1 cm mass lateral to the spleen (series 2/image 18), new. Right deep pelvic 1.0 cm nodule (series 2/image 61), increased from 0.4 cm. Musculoskeletal: No aggressive appearing focal osseous lesions. Stable severe long segment dextroscoliosis with advanced degenerative changes in the thoracolumbar spine. IMPRESSION: 1. Multifocal peritoneal metastases have increased in size and number, as detailed. No ascites. 2. Stable findings of pelvic floor laxity with cystocele and enterocele. 3.  Aortic Atherosclerosis (ICD10-I70.0). Electronically Signed   By: Ilona Sorrel M.D.   On: 05/06/2018 13:52    ASSESSMENT: Stage IIIC ovarian cancer.  PLAN:    1. Stage IIIC ovarian cancer: Patient  now has recurrent disease.  CT scan results from May 06, 2018 reviewed independently and report as above with multifocal peritoneal metastasis increasing in size and number.  Her CA-125 has also increased to 136.0.  She last received treatment with single agent carboplatinum on September 23, 2017. Although, combination therapy is recommended, given patient's advanced age, the toxicity was thought to be too high.  Because of this, will proceed once again with single agent carboplatinum.  Neulasta was not approved by insurance, but may be necessary for future infusions given her history of neutropenia.  Proceed with treatment today.  Return to clinic in 1 week for laboratory work only and then in 3 weeks for further evaluation and consideration of cycle 2.  2. Lupus anticoagulant: Patient was noted to have an elevated PTT as well as increased bleeding during her surgery for rectal prolapse. Continue to monitor closely and repeat lupus anticoagulant panel if patient has evidence of bleeding or requires additional procedure.   3. Anemia: Patient's hemoglobin is 10.9 today.  Monitor.  She last received IV Feraheme in October 2018.    I spent a total of 30 minutes face-to-face with the patient of which greater than 50% of the  visit was spent in counseling and coordination of care as detailed above.   Patient expressed understanding and was in agreement with this plan. She also understands that She can call clinic at any time with any questions, concerns, or complaints.   Cancer Staging Malignant neoplasm of ovary [facility]) Staging form: Ovary, Fallopian Tube, and Primary Peritoneal Carcinoma, AJCC 8th Edition - Clinical stage from 05/29/2017: Stage IIIC (cT3c, cN1b, cM0) - Signed by Lloyd Huger, MD on 05/29/2017   Lloyd Huger, MD   05/13/2018 3:54 PM

## 2018-05-09 ENCOUNTER — Other Ambulatory Visit: Payer: Self-pay | Admitting: Oncology

## 2018-05-10 ENCOUNTER — Encounter: Payer: Self-pay | Admitting: Oncology

## 2018-05-10 ENCOUNTER — Inpatient Hospital Stay (HOSPITAL_BASED_OUTPATIENT_CLINIC_OR_DEPARTMENT_OTHER): Payer: Medicare Other | Admitting: Oncology

## 2018-05-10 ENCOUNTER — Inpatient Hospital Stay: Payer: Medicare Other

## 2018-05-10 ENCOUNTER — Other Ambulatory Visit: Payer: Self-pay

## 2018-05-10 VITALS — BP 159/81 | HR 82 | Temp 96.9°F | Resp 18 | Wt 100.8 lb

## 2018-05-10 DIAGNOSIS — C569 Malignant neoplasm of unspecified ovary: Secondary | ICD-10-CM

## 2018-05-10 DIAGNOSIS — K219 Gastro-esophageal reflux disease without esophagitis: Secondary | ICD-10-CM

## 2018-05-10 DIAGNOSIS — I10 Essential (primary) hypertension: Secondary | ICD-10-CM

## 2018-05-10 DIAGNOSIS — Z90710 Acquired absence of both cervix and uterus: Secondary | ICD-10-CM

## 2018-05-10 DIAGNOSIS — D649 Anemia, unspecified: Secondary | ICD-10-CM

## 2018-05-10 DIAGNOSIS — D6862 Lupus anticoagulant syndrome: Secondary | ICD-10-CM

## 2018-05-10 DIAGNOSIS — C786 Secondary malignant neoplasm of retroperitoneum and peritoneum: Secondary | ICD-10-CM

## 2018-05-10 DIAGNOSIS — Z79899 Other long term (current) drug therapy: Secondary | ICD-10-CM

## 2018-05-10 DIAGNOSIS — Z800 Family history of malignant neoplasm of digestive organs: Secondary | ICD-10-CM

## 2018-05-10 DIAGNOSIS — Z90722 Acquired absence of ovaries, bilateral: Secondary | ICD-10-CM | POA: Diagnosis not present

## 2018-05-10 DIAGNOSIS — Z801 Family history of malignant neoplasm of trachea, bronchus and lung: Secondary | ICD-10-CM

## 2018-05-10 DIAGNOSIS — R971 Elevated cancer antigen 125 [CA 125]: Secondary | ICD-10-CM

## 2018-05-10 DIAGNOSIS — Z9221 Personal history of antineoplastic chemotherapy: Secondary | ICD-10-CM

## 2018-05-10 DIAGNOSIS — M1990 Unspecified osteoarthritis, unspecified site: Secondary | ICD-10-CM

## 2018-05-10 LAB — CBC WITH DIFFERENTIAL/PLATELET
Basophils Absolute: 0 10*3/uL (ref 0–0.1)
Basophils Relative: 1 %
Eosinophils Absolute: 0.1 10*3/uL (ref 0–0.7)
Eosinophils Relative: 2 %
HCT: 31.4 % — ABNORMAL LOW (ref 35.0–47.0)
Hemoglobin: 10.9 g/dL — ABNORMAL LOW (ref 12.0–16.0)
Lymphocytes Relative: 23 %
Lymphs Abs: 1.1 10*3/uL (ref 1.0–3.6)
MCH: 32.8 pg (ref 26.0–34.0)
MCHC: 34.7 g/dL (ref 32.0–36.0)
MCV: 94.5 fL (ref 80.0–100.0)
Monocytes Absolute: 0.4 10*3/uL (ref 0.2–0.9)
Monocytes Relative: 9 %
Neutro Abs: 3.2 10*3/uL (ref 1.4–6.5)
Neutrophils Relative %: 65 %
Platelets: 287 10*3/uL (ref 150–440)
RBC: 3.32 MIL/uL — ABNORMAL LOW (ref 3.80–5.20)
RDW: 14.3 % (ref 11.5–14.5)
WBC: 5 10*3/uL (ref 3.6–11.0)

## 2018-05-10 LAB — COMPREHENSIVE METABOLIC PANEL
ALT: 19 U/L (ref 0–44)
AST: 31 U/L (ref 15–41)
Albumin: 4 g/dL (ref 3.5–5.0)
Alkaline Phosphatase: 83 U/L (ref 38–126)
Anion gap: 8 (ref 5–15)
BUN: 23 mg/dL (ref 8–23)
CO2: 25 mmol/L (ref 22–32)
Calcium: 9 mg/dL (ref 8.9–10.3)
Chloride: 102 mmol/L (ref 98–111)
Creatinine, Ser: 0.8 mg/dL (ref 0.44–1.00)
GFR calc Af Amer: >60 mL/min (ref >60)
GFR calc non Af Amer: >60 mL/min (ref >60)
Glucose, Bld: 97 mg/dL (ref 70–99)
Potassium: 4.2 mmol/L (ref 3.5–5.1)
Sodium: 135 mmol/L (ref 135–145)
Total Bilirubin: 0.5 mg/dL (ref 0.3–1.2)
Total Protein: 6.9 g/dL (ref 6.5–8.1)

## 2018-05-10 MED ORDER — PALONOSETRON HCL INJECTION 0.25 MG/5ML
0.2500 mg | Freq: Once | INTRAVENOUS | Status: AC
  Administered 2018-05-10: 0.25 mg via INTRAVENOUS
  Filled 2018-05-10: qty 5

## 2018-05-10 MED ORDER — SODIUM CHLORIDE 0.9 % IV SOLN
Freq: Once | INTRAVENOUS | Status: AC
  Administered 2018-05-10: 10:00:00 via INTRAVENOUS
  Filled 2018-05-10: qty 5

## 2018-05-10 MED ORDER — SODIUM CHLORIDE 0.9 % IV SOLN
Freq: Once | INTRAVENOUS | Status: AC
  Administered 2018-05-10: 10:00:00 via INTRAVENOUS
  Filled 2018-05-10: qty 250

## 2018-05-10 MED ORDER — SODIUM CHLORIDE 0.9 % IV SOLN
261.5000 mg | Freq: Once | INTRAVENOUS | Status: AC
  Administered 2018-05-10: 260 mg via INTRAVENOUS
  Filled 2018-05-10: qty 26

## 2018-05-10 MED ORDER — HEPARIN SOD (PORK) LOCK FLUSH 100 UNIT/ML IV SOLN
500.0000 [IU] | Freq: Once | INTRAVENOUS | Status: AC | PRN
  Administered 2018-05-10: 500 [IU]
  Filled 2018-05-10: qty 5

## 2018-05-10 NOTE — Progress Notes (Signed)
Patient here for follow up. No concerns voiced.  °

## 2018-05-11 LAB — CA 125: Cancer Antigen (CA) 125: 136 U/mL — ABNORMAL HIGH (ref 0.0–38.1)

## 2018-05-17 ENCOUNTER — Inpatient Hospital Stay: Payer: Medicare Other | Attending: Oncology

## 2018-05-17 DIAGNOSIS — Z90722 Acquired absence of ovaries, bilateral: Secondary | ICD-10-CM | POA: Diagnosis not present

## 2018-05-17 DIAGNOSIS — Z801 Family history of malignant neoplasm of trachea, bronchus and lung: Secondary | ICD-10-CM | POA: Insufficient documentation

## 2018-05-17 DIAGNOSIS — I10 Essential (primary) hypertension: Secondary | ICD-10-CM | POA: Insufficient documentation

## 2018-05-17 DIAGNOSIS — Z800 Family history of malignant neoplasm of digestive organs: Secondary | ICD-10-CM | POA: Insufficient documentation

## 2018-05-17 DIAGNOSIS — D701 Agranulocytosis secondary to cancer chemotherapy: Secondary | ICD-10-CM | POA: Insufficient documentation

## 2018-05-17 DIAGNOSIS — D6862 Lupus anticoagulant syndrome: Secondary | ICD-10-CM | POA: Diagnosis not present

## 2018-05-17 DIAGNOSIS — D649 Anemia, unspecified: Secondary | ICD-10-CM | POA: Diagnosis not present

## 2018-05-17 DIAGNOSIS — T451X5S Adverse effect of antineoplastic and immunosuppressive drugs, sequela: Secondary | ICD-10-CM | POA: Insufficient documentation

## 2018-05-17 DIAGNOSIS — Z79899 Other long term (current) drug therapy: Secondary | ICD-10-CM | POA: Diagnosis not present

## 2018-05-17 DIAGNOSIS — K219 Gastro-esophageal reflux disease without esophagitis: Secondary | ICD-10-CM | POA: Insufficient documentation

## 2018-05-17 DIAGNOSIS — Z5111 Encounter for antineoplastic chemotherapy: Secondary | ICD-10-CM | POA: Diagnosis not present

## 2018-05-17 DIAGNOSIS — Z90710 Acquired absence of both cervix and uterus: Secondary | ICD-10-CM | POA: Insufficient documentation

## 2018-05-17 DIAGNOSIS — C569 Malignant neoplasm of unspecified ovary: Secondary | ICD-10-CM | POA: Insufficient documentation

## 2018-05-17 DIAGNOSIS — M1990 Unspecified osteoarthritis, unspecified site: Secondary | ICD-10-CM | POA: Diagnosis not present

## 2018-05-17 DIAGNOSIS — R110 Nausea: Secondary | ICD-10-CM | POA: Insufficient documentation

## 2018-05-17 DIAGNOSIS — C786 Secondary malignant neoplasm of retroperitoneum and peritoneum: Secondary | ICD-10-CM | POA: Insufficient documentation

## 2018-05-17 DIAGNOSIS — I2510 Atherosclerotic heart disease of native coronary artery without angina pectoris: Secondary | ICD-10-CM | POA: Diagnosis not present

## 2018-05-17 LAB — CBC WITH DIFFERENTIAL/PLATELET
Basophils Absolute: 0 10*3/uL (ref 0–0.1)
Basophils Relative: 1 %
Eosinophils Absolute: 0.1 10*3/uL (ref 0–0.7)
Eosinophils Relative: 2 %
HCT: 32.2 % — ABNORMAL LOW (ref 35.0–47.0)
Hemoglobin: 10.8 g/dL — ABNORMAL LOW (ref 12.0–16.0)
Lymphocytes Relative: 28 %
Lymphs Abs: 0.9 10*3/uL — ABNORMAL LOW (ref 1.0–3.6)
MCH: 32 pg (ref 26.0–34.0)
MCHC: 33.7 g/dL (ref 32.0–36.0)
MCV: 95.1 fL (ref 80.0–100.0)
Monocytes Absolute: 0.3 10*3/uL (ref 0.2–0.9)
Monocytes Relative: 10 %
Neutro Abs: 2 10*3/uL (ref 1.4–6.5)
Neutrophils Relative %: 59 %
Platelets: 253 10*3/uL (ref 150–440)
RBC: 3.39 MIL/uL — ABNORMAL LOW (ref 3.80–5.20)
RDW: 13.9 % (ref 11.5–14.5)
WBC: 3.4 10*3/uL — ABNORMAL LOW (ref 3.6–11.0)

## 2018-05-29 NOTE — Progress Notes (Signed)
[place]  Telephone:(336) [phone] Fax:(336) [phone]  ID: Tamara Mckinney OB: Jan 19, 1928  MR#: [id number]  ZDG#:[id number]  Patient Care Team: [name], MD as PCP - General (Family Medicine) [name], RN as Registered Nurse Herbert Pun, MD as Consulting Physician (General Surgery)  CHIEF COMPLAINT: Stage IIIC ovarian cancer.  INTERVAL HISTORY: Patient returns to clinic today for further evaluation and consideration of cycle 2 of carboplatinum.  She had increased nausea after her first infusion with decreased appetite and PO intake, but states this resolved after several days.  She currently feels well.  She has no neurologic complaints.  She does not complain of abdominal pain or bloating.  She has a fair appetite and denies weight loss.  She denies any recent fevers or illnesses.  She denies any chest pain or shortness of breath. She has no nausea, vomiting, constipation, or diarrhea. She has no urinary complaints.  Patient offers no further specific complaints today.    REVIEW OF SYSTEMS:   Review of Systems  Constitutional: Negative.  Negative for fever, malaise/fatigue and weight loss.  Respiratory: Negative.  Negative for cough and shortness of breath.   Cardiovascular: Negative.  Negative for chest pain and leg swelling.  Gastrointestinal: Negative for abdominal pain, blood in stool, constipation, diarrhea, heartburn, melena, nausea and vomiting.  Genitourinary: Negative.   Musculoskeletal: Negative.   Skin: Negative.  Negative for rash.  Neurological: Negative.  Negative for dizziness and weakness.  Endo/Heme/Allergies: Does not bruise/bleed easily.  Psychiatric/Behavioral: Negative.  The patient is not nervous/anxious.     As per HPI. Otherwise, a complete review of systems is negative.  PAST MEDICAL HISTORY: Past Medical History:  Diagnosis Date  . Anemia   . Arthritis   . Asthma   . Cancer ([place])   . Dyspnea   . GERD  (gastroesophageal reflux disease)   . Hypertension     PAST SURGICAL HISTORY: Past Surgical History:  Procedure Laterality Date  . ABDOMINAL HYSTERECTOMY    . BREAST BIOPSY     x6  . BUNIONECTOMY Bilateral   . CATARACT EXTRACTION, BILATERAL    . FLEXIBLE SIGMOIDOSCOPY N/A 05/21/2017   Procedure: FLEXIBLE SIGMOIDOSCOPY;  Surgeon: Lin Landsman, MD;  Location: [facility] ENDOSCOPY;  Service: Gastroenterology;  Laterality: N/A;  . INCONTINENCE SURGERY    . PORTA CATH INSERTION N/A 06/16/2017   Procedure: PORTA CATH INSERTION;  Surgeon: Algernon Huxley, MD;  Location: [place] CV LAB;  Service: Cardiovascular;  Laterality: N/A;  . RECTAL SURGERY  04/2018  . REPAIR OF RECTAL PROLAPSE N/A 05/11/2017   Procedure: REPAIR OF RECTAL PROLAPSE;  Surgeon: Leonie Green, MD;  Location: [facility] ORS;  Service: General;  Laterality: N/A;  . TONSILLECTOMY    . VAGINA SURGERY     uncertain procedure performed    FAMILY HISTORY: Family History  Problem Relation Age of Onset  . Heart disease Mother   . Heart disease Father   . Heart disease Sister   . Heart attack Sister   . Ulcerative colitis Brother   . Lung cancer Brother   . Thyroid cancer Sister   . Asthma Sister   . Diabetes Sister   . Asthma Sister   . Pancreatic cancer Sister   . Dementia Sister   . Asthma Brother   . Heart disease Brother   . Asthma Brother   . Lung cancer Brother   . Asthma Brother   . Lung cancer Brother   . Lung cancer Brother   .  Rheum arthritis Brother     ADVANCED DIRECTIVES (Y/N):  N  HEALTH MAINTENANCE: Social History   Tobacco Use  . Smoking status: Never Smoker  . Smokeless tobacco: Never Used  Substance Use Topics  . Alcohol use: No  . Drug use: No     Colonoscopy:  PAP:  Bone density:  Lipid panel:  No Known Allergies  Current Outpatient Medications  Medication Sig Dispense Refill  . acetaminophen (TYLENOL) 500 MG tablet Take 500 mg by mouth 2 (two) times daily as needed for  moderate pain.     Marland Kitchen amLODipine (NORVASC) 5 MG tablet Take 5 mg by mouth daily as needed.     . Ascorbic Acid (VITAMIN C) 1000 MG tablet Take 1,000 mg by mouth daily.    . Calcium Carb-Cholecalciferol (CALTRATE 600+D) 600-800 MG-UNIT TABS Take 1 tablet by mouth daily.    . Carboxymeth-Glyc-Polysorb PF (REFRESH OPTIVE ADVANCED PF) 0.5-1-0.5 % SOLN Place 1-2 drops into both eyes 3 (three) times daily as needed. For dry, irritated eyes    . Carboxymethylcellulose Sod PF (REFRESH CELLUVISC) 1 % GEL Place 1 drop into both eyes at bedtime. In the middle of the night     . cetirizine (ZYRTEC) 10 MG tablet Take 10 mg by mouth at bedtime as needed for allergies.    Marland Kitchen docusate sodium (COLACE) 100 MG capsule Take 100-300 mg by mouth at bedtime as needed for mild constipation.    Marland Kitchen esomeprazole (NEXIUM) 40 MG capsule Take 40 mg by mouth daily before breakfast.     . furosemide (LASIX) 40 MG tablet Take 40 mg by mouth as needed.     . montelukast (SINGULAIR) 10 MG tablet Take 10 mg by mouth at bedtime.    Marland Kitchen albuterol (PROVENTIL HFA;VENTOLIN HFA) 108 (90 Base) MCG/ACT inhaler Inhale 1-2 puffs into the lungs every 6 (six) hours as needed for wheezing or shortness of breath.    . Cholecalciferol (VITAMIN D3) 2000 units capsule Take 2,000 Units by mouth daily.    . Cyanocobalamin (RA VITAMIN B12) 2000 MCG TBCR Take 2,000 mcg by mouth daily.    . cyclobenzaprine (FLEXERIL) 10 MG tablet Take 1 tablet (10 mg total) by mouth at bedtime. (Patient not taking: Reported on 05/10/2018) 30 tablet 2  . fluticasone furoate-vilanterol (BREO ELLIPTA) 100-25 MCG/INH AEPB Inhale 1 puff into the lungs as needed.     Marland Kitchen ipratropium-albuterol (DUONEB) 0.5-2.5 (3) MG/3ML SOLN Take 3 mLs by nebulization every 6 (six) hours as needed. For wheezing/shortness of breath    . lidocaine-prilocaine (EMLA) cream Apply 1 application topically as needed. Apply small amount to port site at least 1 hour prior to it being accessed, cover with plastic  wrap (Patient not taking: Reported on 05/10/2018) 30 g 1  . meclizine (ANTIVERT) 25 MG tablet Take 25 mg by mouth 3 (three) times daily as needed (for vertigo).      No current facility-administered medications for this visit.    Facility-Administered Medications Ordered in Other Visits  Medication Dose Route Frequency Provider Last Rate Last Dose  . sodium chloride flush (NS) 0.9 % injection 10 mL  10 mL Intravenous PRN Lloyd Huger, MD   10 mL at 03/10/18 1200  . sodium chloride flush (NS) 0.9 % injection 10 mL  10 mL Intracatheter PRN Lloyd Huger, MD   10 mL at 05/31/18 0854    OBJECTIVE: Vitals:   05/31/18 0907  BP: (!) 170/92  Pulse: 78  Resp: 18  Temp: Marland Kitchen)  95.1 F (35.1 C)     Body mass index is 22.07 kg/m.    ECOG FS:0 - Asymptomatic  General: Thin, no acute distress. Eyes: Pink conjunctiva, anicteric sclera. HEENT: Normocephalic, moist mucous membranes. Lungs: Clear to auscultation bilaterally. Heart: Regular rate and rhythm. No rubs, murmurs, or gallops. Abdomen: Soft, nontender, nondistended. No organomegaly noted, normoactive bowel sounds. Musculoskeletal: No edema, cyanosis, or clubbing. Neuro: Alert, answering all questions appropriately. Cranial nerves grossly intact. Skin: No rashes or petechiae noted. Psych: Normal affect.  LAB RESULTS:  Lab Results  Component Value Date   NA 137 05/31/2018   K 4.0 05/31/2018   CL 104 05/31/2018   CO2 25 05/31/2018   GLUCOSE 88 05/31/2018   BUN 21 05/31/2018   CREATININE 0.84 05/31/2018   CALCIUM 9.0 05/31/2018   PROT 6.8 05/31/2018   ALBUMIN 4.1 05/31/2018   AST 33 05/31/2018   ALT 19 05/31/2018   ALKPHOS 86 05/31/2018   BILITOT 0.6 05/31/2018   GFRNONAA >60 05/31/2018   GFRAA >60 05/31/2018    Lab Results  Component Value Date   WBC 3.3 (L) 05/31/2018   NEUTROABS 1.8 05/31/2018   HGB 10.2 (L) 05/31/2018   HCT 31.2 (L) 05/31/2018   MCV 94.8 05/31/2018   PLT 181 05/31/2018      STUDIES: Ct Abdomen Pelvis W Contrast  Result Date: 05/06/2018 CLINICAL DATA:  Stage IIIC ovarian cancer. Rising CA 125. Last chemotherapy February 2019. Restaging. EXAM: CT ABDOMEN AND PELVIS WITH CONTRAST TECHNIQUE: Multidetector CT imaging of the abdomen and pelvis was performed using the standard protocol following bolus administration of intravenous contrast. CONTRAST:  14mL OMNIPAQUE IOHEXOL 300 MG/ML  SOLN COMPARISON:  10/19/2017 CT abdomen/pelvis. FINDINGS: Lower chest: No significant pulmonary nodules or acute consolidative airspace disease. Coronary atherosclerosis. Hepatobiliary: Normal liver size. No liver mass. Normal gallbladder with no radiopaque cholelithiasis. No biliary ductal dilatation. Pancreas: Normal, with no mass or duct dilation. Spleen: Normal size spleen. Stable granulomatous splenic calcification. No splenic mass. Adrenals/Urinary Tract: No discrete adrenal nodule. Normal kidneys with no hydronephrosis and no renal mass. Stable small cystocele. Otherwise normal nondistended bladder. Stomach/Bowel: Moderate hiatal hernia. Otherwise normal nondistended stomach. Stable pelvic floor enterocele. No dilated small bowel loops or small bowel wall thickening. Normal appendix. Oral contrast transits to the right colon. No large bowel wall thickening. Vascular/Lymphatic: Atherosclerotic nonaneurysmal abdominal aorta. Patent portal, splenic, hepatic and renal veins. No pathologically enlarged lymph nodes in the abdomen or pelvis. Reproductive: Status post hysterectomy, with no abnormal findings at the vaginal cuff. Stable simple 2.6 cm left adnexal cyst. No right adnexal mass. Other: No pneumoperitoneum. No focal fluid collection. No ascites. New heterogeneously enhancing 5.0 x 3.0 cm left omental mass (series 2/image 33). Extracapsular 2.9 x 1.9 cm mass along the posterior right liver lobe (series 2/image 21), increased from 2.0 x 0.6 cm. Lesser omental 2.6 x 2.4 cm mass, increased from  2.0 x 1.4 cm. Subdiaphragmatic left upper quadrant 1.5 x 1.2 cm mass (series 2/image 11), increased from 0.8 x 0.7 cm. Several left upper quadrant 2.5 x 2.1 cm mass lateral to the spleen (series 2/image 18), new. Right deep pelvic 1.0 cm nodule (series 2/image 61), increased from 0.4 cm. Musculoskeletal: No aggressive appearing focal osseous lesions. Stable severe long segment dextroscoliosis with advanced degenerative changes in the thoracolumbar spine. IMPRESSION: 1. Multifocal peritoneal metastases have increased in size and number, as detailed. No ascites. 2. Stable findings of pelvic floor laxity with cystocele and enterocele. 3.  Aortic Atherosclerosis (ICD10-I70.0).  Electronically Signed   By: Ilona Sorrel M.D.   On: 05/06/2018 13:52    ASSESSMENT: Stage IIIC ovarian cancer.  PLAN:    1. Stage IIIC ovarian cancer: Patient now has recurrent disease.  CT scan results from May 06, 2018 reviewed independently with multifocal peritoneal metastasis increasing in size and number.  Her CA-125 has also increased to 136.0.  She last received treatment with single agent carboplatinum on September 23, 2017. Although, combination therapy is recommended, given patient's advanced age, the toxicity was thought to be too high.  Because of this, will proceed once again with single agent carboplatinum.  Neulasta has been approved by insurance, but is not necessary at this time.  She will likely require for future treatments.  Proceed with cycle 2 of single agent carboplatin today.  Return to clinic in 3 weeks for further evaluation and consideration of cycle 3.   2. Lupus anticoagulant: Patient was noted to have an elevated PTT as well as increased bleeding during her surgery for rectal prolapse. Continue to monitor closely and repeat lupus anticoagulant panel if patient has evidence of bleeding or requires additional procedure.   3. Anemia: Patient's hemoglobin has trended down slightly to 10.2.  Monitor.  She  last received IV Feraheme in October 2018.  4.  Nausea: Patient was instructed to take her medication as prescribed. 5.  Leukopenia: Secondary to chemotherapy.  Consider Neulasta as above.  Patient expressed understanding and was in agreement with this plan. She also understands that She can call clinic at any time with any questions, concerns, or complaints.   Cancer Staging Malignant neoplasm of ovary [facility]) Staging form: Ovary, Fallopian Tube, and Primary Peritoneal Carcinoma, AJCC 8th Edition - Clinical stage from 05/29/2017: Stage IIIC (cT3c, cN1b, cM0) - Signed by Lloyd Huger, MD on 05/29/2017   Lloyd Huger, MD   05/31/2018 12:52 PM

## 2018-05-31 ENCOUNTER — Other Ambulatory Visit: Payer: Self-pay | Admitting: Oncology

## 2018-05-31 ENCOUNTER — Other Ambulatory Visit: Payer: Self-pay

## 2018-05-31 ENCOUNTER — Inpatient Hospital Stay: Payer: Medicare Other

## 2018-05-31 ENCOUNTER — Inpatient Hospital Stay (HOSPITAL_BASED_OUTPATIENT_CLINIC_OR_DEPARTMENT_OTHER): Payer: Medicare Other | Admitting: Oncology

## 2018-05-31 VITALS — BP 170/92 | HR 78 | Temp 95.1°F | Resp 18 | Wt 102.0 lb

## 2018-05-31 VITALS — BP 156/84 | HR 72 | Resp 18

## 2018-05-31 DIAGNOSIS — C569 Malignant neoplasm of unspecified ovary: Secondary | ICD-10-CM | POA: Diagnosis not present

## 2018-05-31 DIAGNOSIS — Z800 Family history of malignant neoplasm of digestive organs: Secondary | ICD-10-CM

## 2018-05-31 DIAGNOSIS — T451X5S Adverse effect of antineoplastic and immunosuppressive drugs, sequela: Secondary | ICD-10-CM

## 2018-05-31 DIAGNOSIS — D649 Anemia, unspecified: Secondary | ICD-10-CM | POA: Diagnosis not present

## 2018-05-31 DIAGNOSIS — R110 Nausea: Secondary | ICD-10-CM

## 2018-05-31 DIAGNOSIS — Z801 Family history of malignant neoplasm of trachea, bronchus and lung: Secondary | ICD-10-CM

## 2018-05-31 DIAGNOSIS — I2510 Atherosclerotic heart disease of native coronary artery without angina pectoris: Secondary | ICD-10-CM

## 2018-05-31 DIAGNOSIS — Z90710 Acquired absence of both cervix and uterus: Secondary | ICD-10-CM | POA: Diagnosis not present

## 2018-05-31 DIAGNOSIS — D6862 Lupus anticoagulant syndrome: Secondary | ICD-10-CM

## 2018-05-31 DIAGNOSIS — C786 Secondary malignant neoplasm of retroperitoneum and peritoneum: Secondary | ICD-10-CM | POA: Diagnosis not present

## 2018-05-31 DIAGNOSIS — Z90722 Acquired absence of ovaries, bilateral: Secondary | ICD-10-CM | POA: Diagnosis not present

## 2018-05-31 DIAGNOSIS — Z79899 Other long term (current) drug therapy: Secondary | ICD-10-CM

## 2018-05-31 DIAGNOSIS — M1990 Unspecified osteoarthritis, unspecified site: Secondary | ICD-10-CM

## 2018-05-31 DIAGNOSIS — D701 Agranulocytosis secondary to cancer chemotherapy: Secondary | ICD-10-CM

## 2018-05-31 DIAGNOSIS — K219 Gastro-esophageal reflux disease without esophagitis: Secondary | ICD-10-CM

## 2018-05-31 LAB — CBC WITH DIFFERENTIAL/PLATELET
Abs Immature Granulocytes: 0.01 10*3/uL (ref 0.00–0.07)
Basophils Absolute: 0 10*3/uL (ref 0.0–0.1)
Basophils Relative: 1 %
Eosinophils Absolute: 0.1 10*3/uL (ref 0.0–0.5)
Eosinophils Relative: 2 %
HCT: 31.2 % — ABNORMAL LOW (ref 36.0–46.0)
Hemoglobin: 10.2 g/dL — ABNORMAL LOW (ref 12.0–15.0)
Immature Granulocytes: 0 %
Lymphocytes Relative: 27 %
Lymphs Abs: 0.9 10*3/uL (ref 0.7–4.0)
MCH: 31 pg (ref 26.0–34.0)
MCHC: 32.7 g/dL (ref 30.0–36.0)
MCV: 94.8 fL (ref 80.0–100.0)
Monocytes Absolute: 0.4 10*3/uL (ref 0.1–1.0)
Monocytes Relative: 13 %
Neutro Abs: 1.8 10*3/uL (ref 1.7–7.7)
Neutrophils Relative %: 57 %
Platelets: 181 10*3/uL (ref 150–400)
RBC: 3.29 MIL/uL — ABNORMAL LOW (ref 3.87–5.11)
RDW: 13.7 % (ref 11.5–15.5)
WBC: 3.3 10*3/uL — ABNORMAL LOW (ref 4.0–10.5)
nRBC: 0 % (ref 0.0–0.2)

## 2018-05-31 LAB — COMPREHENSIVE METABOLIC PANEL
ALT: 19 U/L (ref 0–44)
AST: 33 U/L (ref 15–41)
Albumin: 4.1 g/dL (ref 3.5–5.0)
Alkaline Phosphatase: 86 U/L (ref 38–126)
Anion gap: 8 (ref 5–15)
BUN: 21 mg/dL (ref 8–23)
CO2: 25 mmol/L (ref 22–32)
Calcium: 9 mg/dL (ref 8.9–10.3)
Chloride: 104 mmol/L (ref 98–111)
Creatinine, Ser: 0.84 mg/dL (ref 0.44–1.00)
GFR calc Af Amer: >60 mL/min (ref >60)
GFR calc non Af Amer: >60 mL/min (ref >60)
Glucose, Bld: 88 mg/dL (ref 70–99)
Potassium: 4 mmol/L (ref 3.5–5.1)
Sodium: 137 mmol/L (ref 135–145)
Total Bilirubin: 0.6 mg/dL (ref 0.3–1.2)
Total Protein: 6.8 g/dL (ref 6.5–8.1)

## 2018-05-31 MED ORDER — SODIUM CHLORIDE 0.9 % IV SOLN
Freq: Once | INTRAVENOUS | Status: AC
  Administered 2018-05-31: 10:00:00 via INTRAVENOUS
  Filled 2018-05-31: qty 5

## 2018-05-31 MED ORDER — PALONOSETRON HCL INJECTION 0.25 MG/5ML
0.2500 mg | Freq: Once | INTRAVENOUS | Status: AC
  Administered 2018-05-31: 0.25 mg via INTRAVENOUS
  Filled 2018-05-31: qty 5

## 2018-05-31 MED ORDER — SODIUM CHLORIDE 0.9 % IV SOLN
261.5000 mg | Freq: Once | INTRAVENOUS | Status: AC
  Administered 2018-05-31: 260 mg via INTRAVENOUS
  Filled 2018-05-31: qty 26

## 2018-05-31 MED ORDER — SODIUM CHLORIDE 0.9% FLUSH
10.0000 mL | INTRAVENOUS | Status: DC | PRN
  Administered 2018-05-31: 10 mL
  Filled 2018-05-31: qty 10

## 2018-05-31 MED ORDER — SODIUM CHLORIDE 0.9 % IV SOLN
Freq: Once | INTRAVENOUS | Status: AC
  Administered 2018-05-31: 10:00:00 via INTRAVENOUS
  Filled 2018-05-31: qty 250

## 2018-05-31 MED ORDER — HEPARIN SOD (PORK) LOCK FLUSH 100 UNIT/ML IV SOLN
500.0000 [IU] | Freq: Once | INTRAVENOUS | Status: AC | PRN
  Administered 2018-05-31: 500 [IU]
  Filled 2018-05-31: qty 5

## 2018-05-31 NOTE — Progress Notes (Signed)
Here for follow up. Per pt " feeling pretty good for my age"

## 2018-06-01 LAB — CA 125: Cancer Antigen (CA) 125: 63.7 U/mL — ABNORMAL HIGH (ref 0.0–38.1)

## 2018-06-02 ENCOUNTER — Telehealth: Payer: Self-pay | Admitting: *Deleted

## 2018-06-02 NOTE — Telephone Encounter (Signed)
That is for Alzheimer's.  Ok to take if prescribed by her PCP.

## 2018-06-02 NOTE — Telephone Encounter (Signed)
Patient states if it is for Alzheimers, she does not want to take it

## 2018-06-02 NOTE — Telephone Encounter (Signed)
Patient called in with the name of medicine Dr Grayland Ormond is supposed to check to see if she can take and order for her memory It is Donepezil (Aricept). Please advise.

## 2018-06-19 NOTE — Progress Notes (Signed)
[place]  Telephone:(336) [phone] Fax:(336) [phone]  ID: Tamara Mckinney OB: August 07, 1928  MR#: [id number]  MPN#:[id number]  Patient Care Team: [name], MD as PCP - General (Family Medicine) [name], RN as Registered Nurse Herbert Pun, MD as Consulting Physician (General Surgery)  CHIEF COMPLAINT: Stage IIIC ovarian cancer.  INTERVAL HISTORY: Patient returns to clinic today for further evaluation and consideration of cycle 3 of single agent carboplatinum.  She is tolerating her treatments well without significant side effects.  She currently feels well and is asymptomatic.  She has no neurologic complaints.  She does not complain of abdominal pain or bloating.  She has a fair appetite and denies weight loss.  She denies any recent fevers or illnesses.  She denies any chest pain or shortness of breath. She has no nausea, vomiting, constipation, or diarrhea. She has no urinary complaints.  Patient feels at her baseline offers no specific complaints today.  REVIEW OF SYSTEMS:   Review of Systems  Constitutional: Negative.  Negative for fever, malaise/fatigue and weight loss.  Respiratory: Negative.  Negative for cough and shortness of breath.   Cardiovascular: Negative.  Negative for chest pain and leg swelling.  Gastrointestinal: Negative.  Negative for abdominal pain, blood in stool, constipation, diarrhea, heartburn, melena, nausea and vomiting.  Genitourinary: Negative.  Negative for dysuria.  Musculoskeletal: Negative.  Negative for back pain.  Skin: Negative.  Negative for rash.  Neurological: Negative.  Negative for dizziness, focal weakness and weakness.  Endo/Heme/Allergies: Does not bruise/bleed easily.  Psychiatric/Behavioral: Negative.  The patient is not nervous/anxious.     As per HPI. Otherwise, a complete review of systems is negative.  PAST MEDICAL HISTORY: Past Medical History:  Diagnosis Date  . Anemia   . Arthritis     . Asthma   . Cancer ([place])   . Dyspnea   . GERD (gastroesophageal reflux disease)   . Hypertension     PAST SURGICAL HISTORY: Past Surgical History:  Procedure Laterality Date  . ABDOMINAL HYSTERECTOMY    . BREAST BIOPSY     x6  . BUNIONECTOMY Bilateral   . CATARACT EXTRACTION, BILATERAL    . FLEXIBLE SIGMOIDOSCOPY N/A 05/21/2017   Procedure: FLEXIBLE SIGMOIDOSCOPY;  Surgeon: Lin Landsman, MD;  Location: [facility] ENDOSCOPY;  Service: Gastroenterology;  Laterality: N/A;  . INCONTINENCE SURGERY    . PORTA CATH INSERTION N/A 06/16/2017   Procedure: PORTA CATH INSERTION;  Surgeon: Algernon Huxley, MD;  Location: [place] CV LAB;  Service: Cardiovascular;  Laterality: N/A;  . RECTAL SURGERY  04/2018  . REPAIR OF RECTAL PROLAPSE N/A 05/11/2017   Procedure: REPAIR OF RECTAL PROLAPSE;  Surgeon: Leonie Green, MD;  Location: [facility] ORS;  Service: General;  Laterality: N/A;  . TONSILLECTOMY    . VAGINA SURGERY     uncertain procedure performed    FAMILY HISTORY: Family History  Problem Relation Age of Onset  . Heart disease Mother   . Heart disease Father   . Heart disease Sister   . Heart attack Sister   . Ulcerative colitis Brother   . Lung cancer Brother   . Thyroid cancer Sister   . Asthma Sister   . Diabetes Sister   . Asthma Sister   . Pancreatic cancer Sister   . Dementia Sister   . Asthma Brother   . Heart disease Brother   . Asthma Brother   . Lung cancer Brother   . Asthma Brother   . Lung cancer  Brother   . Lung cancer Brother   . Rheum arthritis Brother     ADVANCED DIRECTIVES (Y/N):  N  HEALTH MAINTENANCE: Social History   Tobacco Use  . Smoking status: Never Smoker  . Smokeless tobacco: Never Used  Substance Use Topics  . Alcohol use: No  . Drug use: No     Colonoscopy:  PAP:  Bone density:  Lipid panel:  No Known Allergies  Current Outpatient Medications  Medication Sig Dispense Refill  . amLODipine (NORVASC) 5 MG tablet Take 5  mg by mouth daily as needed.     . Ascorbic Acid (VITAMIN C) 1000 MG tablet Take 1,000 mg by mouth daily.    . Calcium Carb-Cholecalciferol (CALTRATE 600+D) 600-800 MG-UNIT TABS Take 1 tablet by mouth daily.    . Carboxymeth-Glyc-Polysorb PF (REFRESH OPTIVE ADVANCED PF) 0.5-1-0.5 % SOLN Place 1-2 drops into both eyes 3 (three) times daily as needed. For dry, irritated eyes    . Carboxymethylcellulose Sod PF (REFRESH CELLUVISC) 1 % GEL Place 1 drop into both eyes at bedtime. In the middle of the night     . Cholecalciferol (VITAMIN D3) 2000 units capsule Take 2,000 Units by mouth daily.    Marland Kitchen docusate sodium (COLACE) 100 MG capsule Take 100-300 mg by mouth at bedtime as needed for mild constipation.    Marland Kitchen esomeprazole (NEXIUM) 40 MG capsule Take 40 mg by mouth daily before breakfast.     . lidocaine-prilocaine (EMLA) cream Apply 1 application topically as needed. Apply small amount to port site at least 1 hour prior to it being accessed, cover with plastic wrap 30 g 1  . polyethylene glycol (MIRALAX / GLYCOLAX) packet Take by mouth.    . valsartan (DIOVAN) 160 MG tablet Start 1/2 tab a day.  Can increase to 1 tab after a few weeks if BP stays high    . acetaminophen (TYLENOL) 500 MG tablet Take 500 mg by mouth 2 (two) times daily as needed for moderate pain.     Marland Kitchen albuterol (PROVENTIL HFA;VENTOLIN HFA) 108 (90 Base) MCG/ACT inhaler Inhale 1-2 puffs into the lungs every 6 (six) hours as needed for wheezing or shortness of breath.    . cetirizine (ZYRTEC) 10 MG tablet Take 10 mg by mouth at bedtime as needed for allergies.    . Cyanocobalamin (RA VITAMIN B12) 2000 MCG TBCR Take 2,000 mcg by mouth daily.    . cyclobenzaprine (FLEXERIL) 10 MG tablet TAKE ONE TABLET BY MOUTH EVERY NIGHT AT BEDTIME (Patient not taking: Reported on 06/21/2018) 30 tablet 1  . fluticasone furoate-vilanterol (BREO ELLIPTA) 100-25 MCG/INH AEPB Inhale 1 puff into the lungs as needed.     . furosemide (LASIX) 40 MG tablet Take 40 mg  by mouth as needed.     Marland Kitchen ipratropium-albuterol (DUONEB) 0.5-2.5 (3) MG/3ML SOLN Take 3 mLs by nebulization every 6 (six) hours as needed. For wheezing/shortness of breath    . meclizine (ANTIVERT) 25 MG tablet Take 25 mg by mouth 3 (three) times daily as needed (for vertigo).     . montelukast (SINGULAIR) 10 MG tablet Take 10 mg by mouth at bedtime.     No current facility-administered medications for this visit.    Facility-Administered Medications Ordered in Other Visits  Medication Dose Route Frequency Provider Last Rate Last Dose  . CARBOplatin (PARAPLATIN) 260 mg in sodium chloride 0.9 % 250 mL chemo infusion  260 mg Intravenous Once Lloyd Huger, MD      . fosaprepitant (  EMEND) 150 mg, dexamethasone (DECADRON) 12 mg in sodium chloride 0.9 % 145 mL IVPB   Intravenous Once Lloyd Huger, MD 454 mL/hr at 06/21/18 1014    . heparin lock flush 100 unit/mL  500 Units Intracatheter Once PRN Lloyd Huger, MD      . sodium chloride flush (NS) 0.9 % injection 10 mL  10 mL Intravenous PRN Lloyd Huger, MD   10 mL at 03/10/18 1200    OBJECTIVE: Vitals:   06/21/18 0903  BP: (!) 160/88  Pulse: 94  Resp: 18  Temp: (!) 95.3 F (35.2 C)     Body mass index is 21.77 kg/m.    ECOG FS:0 - Asymptomatic  General: Well-developed, well-nourished, no acute distress. Eyes: Pink conjunctiva, anicteric sclera. HEENT: Normocephalic, moist mucous membranes. Lungs: Clear to auscultation bilaterally. Heart: Regular rate and rhythm. No rubs, murmurs, or gallops. Abdomen: Soft, nontender, nondistended. No organomegaly noted, normoactive bowel sounds. Musculoskeletal: No edema, cyanosis, or clubbing. Neuro: Alert, answering all questions appropriately. Cranial nerves grossly intact. Skin: No rashes or petechiae noted. Psych: Normal affect.  LAB RESULTS:  Lab Results  Component Value Date   NA 134 (L) 06/21/2018   K 3.9 06/21/2018   CL 103 06/21/2018   CO2 24 06/21/2018    GLUCOSE 135 (H) 06/21/2018   BUN 24 (H) 06/21/2018   CREATININE 0.82 06/21/2018   CALCIUM 8.8 (L) 06/21/2018   PROT 6.4 (L) 06/21/2018   ALBUMIN 3.9 06/21/2018   AST 29 06/21/2018   ALT 13 06/21/2018   ALKPHOS 75 06/21/2018   BILITOT 0.4 06/21/2018   GFRNONAA >60 06/21/2018   GFRAA >60 06/21/2018    Lab Results  Component Value Date   WBC 1.9 (L) 06/21/2018   NEUTROABS 1.0 (L) 06/21/2018   HGB 9.0 (L) 06/21/2018   HCT 28.3 (L) 06/21/2018   MCV 97.3 06/21/2018   PLT 167 06/21/2018     STUDIES: No results found.  ASSESSMENT: Stage IIIC ovarian cancer.  PLAN:    1. Stage IIIC ovarian cancer: Patient now has recurrent disease.  CT scan results from May 06, 2018 reviewed independently with multifocal peritoneal metastasis increasing in size and number.  CA-125 is now trending down and her most recent result was 63.7, today's result is pending.  Although, combination therapy is recommended, given patient's advanced age, the toxicity was thought to be too high.  Because of this, will proceed once again with single agent carboplatinum.  Neulasta has been approved by insurance therefore we will add with the remainder of her treatments.  Proceed with cycle 3 of single agent carboplatin today.  Patient return to clinic in 2 days for Neulasta and then in 4 weeks because of the Thanksgiving holiday for further evaluation and consideration of cycle 4.  2. Lupus anticoagulant: Patient was noted to have an elevated PTT as well as increased bleeding during her surgery for rectal prolapse. Continue to monitor closely and repeat lupus anticoagulant panel if patient has evidence of bleeding or requires additional procedures.   3. Anemia: Patient's hemoglobin continues to trend down slightly and is now 9.0.  Monitor.  Repeat iron stores with next lab draw.  She last received IV Feraheme in October 2018.  4.  Nausea: Patient does not complain of this today.  Patient was instructed to take her  medication as prescribed. 5.  Leukopenia: Secondary to chemotherapy.  Neulasta as above.  Patient expressed understanding and was in agreement with this plan. She also understands that  She can call clinic at any time with any questions, concerns, or complaints.   Cancer Staging Malignant neoplasm of ovary [facility]) Staging form: Ovary, Fallopian Tube, and Primary Peritoneal Carcinoma, AJCC 8th Edition - Clinical stage from 05/29/2017: Stage IIIC (cT3c, cN1b, cM0) - Signed by Lloyd Huger, MD on 05/29/2017   Lloyd Huger, MD   06/21/2018 10:31 AM

## 2018-06-21 ENCOUNTER — Other Ambulatory Visit: Payer: Self-pay

## 2018-06-21 ENCOUNTER — Other Ambulatory Visit: Payer: Self-pay | Admitting: Oncology

## 2018-06-21 ENCOUNTER — Inpatient Hospital Stay: Payer: Medicare Other

## 2018-06-21 ENCOUNTER — Inpatient Hospital Stay (HOSPITAL_BASED_OUTPATIENT_CLINIC_OR_DEPARTMENT_OTHER): Payer: Medicare Other | Admitting: Oncology

## 2018-06-21 ENCOUNTER — Inpatient Hospital Stay: Payer: Medicare Other | Attending: Oncology

## 2018-06-21 VITALS — BP 160/88 | HR 94 | Temp 95.3°F | Resp 18 | Wt 100.6 lb

## 2018-06-21 DIAGNOSIS — C569 Malignant neoplasm of unspecified ovary: Secondary | ICD-10-CM

## 2018-06-21 DIAGNOSIS — T451X5S Adverse effect of antineoplastic and immunosuppressive drugs, sequela: Secondary | ICD-10-CM | POA: Insufficient documentation

## 2018-06-21 DIAGNOSIS — D649 Anemia, unspecified: Secondary | ICD-10-CM | POA: Insufficient documentation

## 2018-06-21 DIAGNOSIS — K219 Gastro-esophageal reflux disease without esophagitis: Secondary | ICD-10-CM | POA: Diagnosis not present

## 2018-06-21 DIAGNOSIS — Z90722 Acquired absence of ovaries, bilateral: Secondary | ICD-10-CM | POA: Insufficient documentation

## 2018-06-21 DIAGNOSIS — C786 Secondary malignant neoplasm of retroperitoneum and peritoneum: Secondary | ICD-10-CM | POA: Diagnosis not present

## 2018-06-21 DIAGNOSIS — I10 Essential (primary) hypertension: Secondary | ICD-10-CM | POA: Insufficient documentation

## 2018-06-21 DIAGNOSIS — Z5111 Encounter for antineoplastic chemotherapy: Secondary | ICD-10-CM | POA: Insufficient documentation

## 2018-06-21 DIAGNOSIS — Z90710 Acquired absence of both cervix and uterus: Secondary | ICD-10-CM | POA: Insufficient documentation

## 2018-06-21 DIAGNOSIS — Z79899 Other long term (current) drug therapy: Secondary | ICD-10-CM

## 2018-06-21 DIAGNOSIS — D6862 Lupus anticoagulant syndrome: Secondary | ICD-10-CM

## 2018-06-21 DIAGNOSIS — D701 Agranulocytosis secondary to cancer chemotherapy: Secondary | ICD-10-CM

## 2018-06-21 DIAGNOSIS — Z95828 Presence of other vascular implants and grafts: Secondary | ICD-10-CM

## 2018-06-21 DIAGNOSIS — M1990 Unspecified osteoarthritis, unspecified site: Secondary | ICD-10-CM | POA: Insufficient documentation

## 2018-06-21 LAB — CBC WITH DIFFERENTIAL/PLATELET
Abs Immature Granulocytes: 0 10*3/uL (ref 0.00–0.07)
Basophils Absolute: 0 10*3/uL (ref 0.0–0.1)
Basophils Relative: 1 %
Eosinophils Absolute: 0 10*3/uL (ref 0.0–0.5)
Eosinophils Relative: 2 %
HCT: 28.3 % — ABNORMAL LOW (ref 36.0–46.0)
Hemoglobin: 9 g/dL — ABNORMAL LOW (ref 12.0–15.0)
Immature Granulocytes: 0 %
Lymphocytes Relative: 34 %
Lymphs Abs: 0.7 10*3/uL (ref 0.7–4.0)
MCH: 30.9 pg (ref 26.0–34.0)
MCHC: 31.8 g/dL (ref 30.0–36.0)
MCV: 97.3 fL (ref 80.0–100.0)
Monocytes Absolute: 0.3 10*3/uL (ref 0.1–1.0)
Monocytes Relative: 14 %
Neutro Abs: 1 10*3/uL — ABNORMAL LOW (ref 1.7–7.7)
Neutrophils Relative %: 49 %
Platelets: 167 10*3/uL (ref 150–400)
RBC: 2.91 MIL/uL — ABNORMAL LOW (ref 3.87–5.11)
RDW: 14.9 % (ref 11.5–15.5)
WBC: 1.9 10*3/uL — ABNORMAL LOW (ref 4.0–10.5)
nRBC: 0 % (ref 0.0–0.2)

## 2018-06-21 LAB — COMPREHENSIVE METABOLIC PANEL
ALT: 13 U/L (ref 0–44)
AST: 29 U/L (ref 15–41)
Albumin: 3.9 g/dL (ref 3.5–5.0)
Alkaline Phosphatase: 75 U/L (ref 38–126)
Anion gap: 7 (ref 5–15)
BUN: 24 mg/dL — ABNORMAL HIGH (ref 8–23)
CO2: 24 mmol/L (ref 22–32)
Calcium: 8.8 mg/dL — ABNORMAL LOW (ref 8.9–10.3)
Chloride: 103 mmol/L (ref 98–111)
Creatinine, Ser: 0.82 mg/dL (ref 0.44–1.00)
GFR calc Af Amer: >60 mL/min (ref >60)
GFR calc non Af Amer: >60 mL/min (ref >60)
Glucose, Bld: 135 mg/dL — ABNORMAL HIGH (ref 70–99)
Potassium: 3.9 mmol/L (ref 3.5–5.1)
Sodium: 134 mmol/L — ABNORMAL LOW (ref 135–145)
Total Bilirubin: 0.4 mg/dL (ref 0.3–1.2)
Total Protein: 6.4 g/dL — ABNORMAL LOW (ref 6.5–8.1)

## 2018-06-21 MED ORDER — PALONOSETRON HCL INJECTION 0.25 MG/5ML
0.2500 mg | Freq: Once | INTRAVENOUS | Status: AC
  Administered 2018-06-21: 0.25 mg via INTRAVENOUS
  Filled 2018-06-21: qty 5

## 2018-06-21 MED ORDER — HEPARIN SOD (PORK) LOCK FLUSH 100 UNIT/ML IV SOLN
500.0000 [IU] | Freq: Once | INTRAVENOUS | Status: AC | PRN
  Administered 2018-06-21: 500 [IU]
  Filled 2018-06-21 (×2): qty 5

## 2018-06-21 MED ORDER — SODIUM CHLORIDE 0.9 % IV SOLN
Freq: Once | INTRAVENOUS | Status: AC
  Administered 2018-06-21: 10:00:00 via INTRAVENOUS
  Filled 2018-06-21: qty 5

## 2018-06-21 MED ORDER — SODIUM CHLORIDE 0.9 % IV SOLN
261.5000 mg | Freq: Once | INTRAVENOUS | Status: AC
  Administered 2018-06-21: 260 mg via INTRAVENOUS
  Filled 2018-06-21: qty 26

## 2018-06-21 MED ORDER — SODIUM CHLORIDE 0.9 % IV SOLN
Freq: Once | INTRAVENOUS | Status: AC
  Administered 2018-06-21: 10:00:00 via INTRAVENOUS
  Filled 2018-06-21: qty 250

## 2018-06-21 NOTE — Progress Notes (Signed)
Here for follow up. Per pt feeling " pretty good overall"

## 2018-06-21 NOTE — Progress Notes (Signed)
Proceed with today's treatment per Dr. Grayland Ormond.  He is aware of patient's lab results.

## 2018-06-22 LAB — CA 125: Cancer Antigen (CA) 125: 53.3 U/mL — ABNORMAL HIGH (ref 0.0–38.1)

## 2018-06-23 ENCOUNTER — Inpatient Hospital Stay: Payer: Medicare Other

## 2018-06-23 DIAGNOSIS — C569 Malignant neoplasm of unspecified ovary: Secondary | ICD-10-CM | POA: Diagnosis not present

## 2018-06-23 MED ORDER — PEGFILGRASTIM-CBQV 6 MG/0.6ML ~~LOC~~ SOSY
6.0000 mg | PREFILLED_SYRINGE | Freq: Once | SUBCUTANEOUS | Status: AC
  Administered 2018-06-23: 6 mg via SUBCUTANEOUS

## 2018-07-17 NOTE — Progress Notes (Signed)
[place]  [phone]) [phone] Fax:(336) [phone]  ID: Tamara Mckinney OB: February 03, 1928  MR#: [id number]  ZJI#:[id number]  Patient Care Team: [name], MD as PCP - General (Family Medicine) [name], RN as Registered Nurse Herbert Pun, MD as Consulting Physician (General Surgery)  CHIEF COMPLAINT: Stage IIIC ovarian cancer.  INTERVAL HISTORY: Patient returns to clinic today for further evaluation and consideration of cycle 4 of single agent carboplatinum.  She continues to tolerate her treatments well without significant side effects. She currently feels well and is asymptomatic.  She admits to occasional pelvic pain, but states this is well controlled with Tylenol.  She has no neurologic complaints. She has a fair appetite and denies weight loss.  She denies any recent fevers or illnesses.  She denies any chest pain or shortness of breath. She has no nausea, vomiting, constipation, or diarrhea. She has no urinary complaints.  Patient offers no further specific complaints today.  REVIEW OF SYSTEMS:   Review of Systems  Constitutional: Negative.  Negative for fever, malaise/fatigue and weight loss.  Respiratory: Negative.  Negative for cough and shortness of breath.   Cardiovascular: Negative.  Negative for chest pain and leg swelling.  Gastrointestinal: Negative.  Negative for abdominal pain, blood in stool, constipation, diarrhea, heartburn, melena, nausea and vomiting.  Genitourinary: Negative.  Negative for dysuria.  Musculoskeletal: Negative.  Negative for back pain.  Skin: Negative.  Negative for rash.  Neurological: Negative.  Negative for dizziness, focal weakness and weakness.  Endo/Heme/Allergies: Does not bruise/bleed easily.  Psychiatric/Behavioral: Negative.  The patient is not nervous/anxious.     As per HPI. Otherwise, a complete review of systems is negative.  PAST MEDICAL HISTORY: Past Medical History:  Diagnosis Date    . Anemia   . Arthritis   . Asthma   . Cancer ([place])   . Dyspnea   . GERD (gastroesophageal reflux disease)   . Hypertension     PAST SURGICAL HISTORY: Past Surgical History:  Procedure Laterality Date  . ABDOMINAL HYSTERECTOMY    . BREAST BIOPSY     x6  . BUNIONECTOMY Bilateral   . CATARACT EXTRACTION, BILATERAL    . FLEXIBLE SIGMOIDOSCOPY N/A 05/21/2017   Procedure: FLEXIBLE SIGMOIDOSCOPY;  Surgeon: Lin Landsman, MD;  Location: [facility] ENDOSCOPY;  Service: Gastroenterology;  Laterality: N/A;  . INCONTINENCE SURGERY    . PORTA CATH INSERTION N/A 06/16/2017   Procedure: PORTA CATH INSERTION;  Surgeon: Algernon Huxley, MD;  Location: [place] [facility] LAB;  Service: Cardiovascular;  Laterality: N/A;  . RECTAL SURGERY  04/2018  . REPAIR OF RECTAL PROLAPSE N/A 05/11/2017   Procedure: REPAIR OF RECTAL PROLAPSE;  Surgeon: Leonie Green, MD;  Location: [facility] ORS;  Service: General;  Laterality: N/A;  . TONSILLECTOMY    . VAGINA SURGERY     uncertain procedure performed    FAMILY HISTORY: Family History  Problem Relation Age of Onset  . Heart disease Mother   . Heart disease Father   . Heart disease Sister   . Heart attack Sister   . Ulcerative colitis Brother   . Lung cancer Brother   . Thyroid cancer Sister   . Asthma Sister   . Diabetes Sister   . Asthma Sister   . Pancreatic cancer Sister   . Dementia Sister   . Asthma Brother   . Heart disease Brother   . Asthma Brother   . Lung cancer Brother   . Asthma Brother   . Lung  cancer Brother   . Lung cancer Brother   . Rheum arthritis Brother     ADVANCED DIRECTIVES (Y/N):  N  HEALTH MAINTENANCE: Social History   Tobacco Use  . Smoking status: Never Smoker  . Smokeless tobacco: Never Used  Substance Use Topics  . Alcohol use: No  . Drug use: No     Colonoscopy:  PAP:  Bone density:  Lipid panel:  No Known Allergies  Current Outpatient Medications  Medication Sig Dispense Refill  . acetaminophen  (TYLENOL) 500 MG tablet Take 500 mg by mouth 2 (two) times daily as needed for moderate pain.     Marland Kitchen albuterol (PROVENTIL HFA;VENTOLIN HFA) 108 (90 Base) MCG/ACT inhaler Inhale 1-2 puffs into the lungs every 6 (six) hours as needed for wheezing or shortness of breath.    Marland Kitchen amLODipine (NORVASC) 5 MG tablet Take 5 mg by mouth daily as needed.     . Ascorbic Acid (VITAMIN C) 1000 MG tablet Take 1,000 mg by mouth daily.    . Calcium Carb-Cholecalciferol (CALTRATE 600+D) 600-800 MG-UNIT TABS Take 1 tablet by mouth daily.    . Carboxymeth-Glyc-Polysorb PF (REFRESH OPTIVE ADVANCED PF) 0.5-1-0.5 % SOLN Place 1-2 drops into both eyes 3 (three) times daily as needed. For dry, irritated eyes    . Carboxymethylcellulose Sod PF (REFRESH CELLUVISC) 1 % GEL Place 1 drop into both eyes at bedtime. In the middle of the night     . cetirizine (ZYRTEC) 10 MG tablet Take 10 mg by mouth at bedtime as needed for allergies.    . Cholecalciferol (VITAMIN D3) 2000 units capsule Take 2,000 Units by mouth daily.    . Cyanocobalamin (RA VITAMIN B12) 2000 MCG TBCR Take 2,000 mcg by mouth daily.    . cyclobenzaprine (FLEXERIL) 10 MG tablet TAKE ONE TABLET BY MOUTH EVERY NIGHT AT BEDTIME 30 tablet 1  . docusate sodium (COLACE) 100 MG capsule Take 100-300 mg by mouth at bedtime as needed for mild constipation.    Marland Kitchen esomeprazole (NEXIUM) 40 MG capsule Take 40 mg by mouth daily before breakfast.     . fluticasone furoate-vilanterol (BREO ELLIPTA) 100-25 MCG/INH AEPB Inhale 1 puff into the lungs as needed.     . furosemide (LASIX) 40 MG tablet Take 40 mg by mouth as needed.     Marland Kitchen ipratropium-albuterol (DUONEB) 0.5-2.5 (3) MG/3ML SOLN Take 3 mLs by nebulization every 6 (six) hours as needed. For wheezing/shortness of breath    . lidocaine-prilocaine (EMLA) cream Apply 1 application topically as needed. Apply small amount to port site at least 1 hour prior to it being accessed, cover with plastic wrap 30 g 1  . meclizine (ANTIVERT) 25  MG tablet Take 25 mg by mouth 3 (three) times daily as needed (for vertigo).     . montelukast (SINGULAIR) 10 MG tablet Take 10 mg by mouth at bedtime.    . polyethylene glycol (MIRALAX / GLYCOLAX) packet Take by mouth.    . valsartan (DIOVAN) 160 MG tablet Start 1/2 tab a day.  Can increase to 1 tab after a few weeks if BP stays high     No current facility-administered medications for this visit.    Facility-Administered Medications Ordered in Other Visits  Medication Dose Route Frequency Provider Last Rate Last Dose  . sodium chloride flush (NS) 0.9 % injection 10 mL  10 mL Intravenous PRN Lloyd Huger, MD   10 mL at 03/10/18 1200    OBJECTIVE: Vitals:   07/20/18 1052  BP: (!) 156/82  Pulse: 91  Temp: (!) 97.3 F (36.3 C)     Body mass index is 21.51 kg/m.    ECOG FS:0 - Asymptomatic  General: Well-developed, well-nourished, no acute distress. Eyes: Pink conjunctiva, anicteric sclera. HEENT: Normocephalic, moist mucous membranes. Lungs: Clear to auscultation bilaterally. Heart: Regular rate and rhythm. No rubs, murmurs, or gallops. Abdomen: Soft, nontender, nondistended. No organomegaly noted, normoactive bowel sounds. Musculoskeletal: No edema, cyanosis, or clubbing. Neuro: Alert, answering all questions appropriately. Cranial nerves grossly intact. Skin: No rashes or petechiae noted. Psych: Normal affect.  LAB RESULTS:  Lab Results  Component Value Date   NA 134 (L) 07/20/2018   K 4.5 07/20/2018   CL 103 07/20/2018   CO2 24 07/20/2018   GLUCOSE 103 (H) 07/20/2018   BUN 28 (H) 07/20/2018   CREATININE 0.87 07/20/2018   CALCIUM 8.6 (L) 07/20/2018   PROT 6.8 07/20/2018   ALBUMIN 3.8 07/20/2018   AST 26 07/20/2018   ALT 13 07/20/2018   ALKPHOS 94 07/20/2018   BILITOT 0.6 07/20/2018   GFRNONAA 59 (L) 07/20/2018   GFRAA >60 07/20/2018    Lab Results  Component Value Date   WBC 6.6 07/20/2018   NEUTROABS 4.9 07/20/2018   HGB 9.2 (L) 07/20/2018   HCT  28.4 (L) 07/20/2018   MCV 98.6 07/20/2018   PLT 349 07/20/2018     STUDIES: No results found.  ASSESSMENT: Stage IIIC ovarian cancer.  PLAN:    1. Stage IIIC ovarian cancer: Patient now has recurrent disease.  CT scan results from May 06, 2018 reviewed independently with multifocal peritoneal metastasis increasing in size and number.  CA-125 has trended up slightly to 100. Although, combination therapy is recommended, given patient's advanced age, the toxicity was thought to be too high.  Because of this, will proceed once again with single agent carboplatinum.  Neulasta has been approved by insurance therefore we will add with the remainder of her treatments.  Proceed with cycle 4 of single agent carboplatin today.  Return to clinic in 2 days for Neulasta and then in 3 weeks for further evaluation and consideration of cycle 5.   2. Lupus anticoagulant: Patient was noted to have an elevated PTT as well as increased bleeding during her surgery for rectal prolapse. Continue to monitor closely and repeat lupus anticoagulant panel if patient has evidence of bleeding or requires additional procedures.   3. Anemia: Patient's hemoglobin is decreased, but stable at 9.2.  Iron stores are within normal limits.  Continue to monitor.   4.  Nausea: Patient does not complain of this today.   5.  Leukopenia: Resolved.  Neulasta as above.  Patient expressed understanding and was in agreement with this plan. She also understands that She can call clinic at any time with any questions, concerns, or complaints.   Cancer Staging Malignant neoplasm of ovary [facility]) Staging form: Ovary, Fallopian Tube, and Primary Peritoneal Carcinoma, AJCC 8th Edition - Clinical stage from 05/29/2017: Stage IIIC (cT3c, cN1b, cM0) - Signed by Lloyd Huger, MD on 05/29/2017   Lloyd Huger, MD   07/23/2018 6:35 AM

## 2018-07-20 ENCOUNTER — Inpatient Hospital Stay: Payer: Medicare Other | Attending: Oncology

## 2018-07-20 ENCOUNTER — Other Ambulatory Visit: Payer: Self-pay

## 2018-07-20 ENCOUNTER — Inpatient Hospital Stay (HOSPITAL_BASED_OUTPATIENT_CLINIC_OR_DEPARTMENT_OTHER): Payer: Medicare Other | Admitting: Oncology

## 2018-07-20 ENCOUNTER — Inpatient Hospital Stay: Payer: Medicare Other

## 2018-07-20 VITALS — BP 156/82 | HR 91 | Temp 97.3°F | Wt 99.4 lb

## 2018-07-20 DIAGNOSIS — Z801 Family history of malignant neoplasm of trachea, bronchus and lung: Secondary | ICD-10-CM | POA: Diagnosis not present

## 2018-07-20 DIAGNOSIS — C569 Malignant neoplasm of unspecified ovary: Secondary | ICD-10-CM

## 2018-07-20 DIAGNOSIS — Z5111 Encounter for antineoplastic chemotherapy: Secondary | ICD-10-CM | POA: Insufficient documentation

## 2018-07-20 DIAGNOSIS — Z7689 Persons encountering health services in other specified circumstances: Secondary | ICD-10-CM | POA: Diagnosis not present

## 2018-07-20 DIAGNOSIS — D6862 Lupus anticoagulant syndrome: Secondary | ICD-10-CM | POA: Diagnosis not present

## 2018-07-20 DIAGNOSIS — G893 Neoplasm related pain (acute) (chronic): Secondary | ICD-10-CM | POA: Insufficient documentation

## 2018-07-20 DIAGNOSIS — Z800 Family history of malignant neoplasm of digestive organs: Secondary | ICD-10-CM | POA: Insufficient documentation

## 2018-07-20 DIAGNOSIS — C786 Secondary malignant neoplasm of retroperitoneum and peritoneum: Secondary | ICD-10-CM | POA: Insufficient documentation

## 2018-07-20 DIAGNOSIS — D649 Anemia, unspecified: Secondary | ICD-10-CM

## 2018-07-20 DIAGNOSIS — Z79899 Other long term (current) drug therapy: Secondary | ICD-10-CM | POA: Diagnosis not present

## 2018-07-20 DIAGNOSIS — Z90710 Acquired absence of both cervix and uterus: Secondary | ICD-10-CM | POA: Diagnosis not present

## 2018-07-20 DIAGNOSIS — Z90722 Acquired absence of ovaries, bilateral: Secondary | ICD-10-CM | POA: Insufficient documentation

## 2018-07-20 DIAGNOSIS — M1990 Unspecified osteoarthritis, unspecified site: Secondary | ICD-10-CM

## 2018-07-20 DIAGNOSIS — I10 Essential (primary) hypertension: Secondary | ICD-10-CM | POA: Diagnosis not present

## 2018-07-20 DIAGNOSIS — K219 Gastro-esophageal reflux disease without esophagitis: Secondary | ICD-10-CM | POA: Insufficient documentation

## 2018-07-20 LAB — FERRITIN: Ferritin: 174 ng/mL (ref 11–307)

## 2018-07-20 LAB — IRON AND TIBC
Iron: 52 ug/dL (ref 28–170)
Saturation Ratios: 16 % (ref 10.4–31.8)
TIBC: 316 ug/dL (ref 250–450)
UIBC: 264 ug/dL

## 2018-07-20 LAB — COMPREHENSIVE METABOLIC PANEL
ALT: 13 U/L (ref 0–44)
AST: 26 U/L (ref 15–41)
Albumin: 3.8 g/dL (ref 3.5–5.0)
Alkaline Phosphatase: 94 U/L (ref 38–126)
Anion gap: 7 (ref 5–15)
BUN: 28 mg/dL — ABNORMAL HIGH (ref 8–23)
CO2: 24 mmol/L (ref 22–32)
Calcium: 8.6 mg/dL — ABNORMAL LOW (ref 8.9–10.3)
Chloride: 103 mmol/L (ref 98–111)
Creatinine, Ser: 0.87 mg/dL (ref 0.44–1.00)
GFR calc Af Amer: >60 mL/min (ref >60)
GFR calc non Af Amer: 59 mL/min — ABNORMAL LOW (ref >60)
Glucose, Bld: 103 mg/dL — ABNORMAL HIGH (ref 70–99)
Potassium: 4.5 mmol/L (ref 3.5–5.1)
Sodium: 134 mmol/L — ABNORMAL LOW (ref 135–145)
Total Bilirubin: 0.6 mg/dL (ref 0.3–1.2)
Total Protein: 6.8 g/dL (ref 6.5–8.1)

## 2018-07-20 LAB — CBC WITH DIFFERENTIAL/PLATELET
Abs Immature Granulocytes: 0.05 10*3/uL (ref 0.00–0.07)
Basophils Absolute: 0 10*3/uL (ref 0.0–0.1)
Basophils Relative: 1 %
Eosinophils Absolute: 0 10*3/uL (ref 0.0–0.5)
Eosinophils Relative: 0 %
HCT: 28.4 % — ABNORMAL LOW (ref 36.0–46.0)
Hemoglobin: 9.2 g/dL — ABNORMAL LOW (ref 12.0–15.0)
Immature Granulocytes: 1 %
Lymphocytes Relative: 16 %
Lymphs Abs: 1 10*3/uL (ref 0.7–4.0)
MCH: 31.9 pg (ref 26.0–34.0)
MCHC: 32.4 g/dL (ref 30.0–36.0)
MCV: 98.6 fL (ref 80.0–100.0)
Monocytes Absolute: 0.5 10*3/uL (ref 0.1–1.0)
Monocytes Relative: 8 %
Neutro Abs: 4.9 10*3/uL (ref 1.7–7.7)
Neutrophils Relative %: 74 %
Platelets: 349 10*3/uL (ref 150–400)
RBC: 2.88 MIL/uL — ABNORMAL LOW (ref 3.87–5.11)
RDW: 16.6 % — ABNORMAL HIGH (ref 11.5–15.5)
WBC: 6.6 10*3/uL (ref 4.0–10.5)
nRBC: 0 % (ref 0.0–0.2)

## 2018-07-20 MED ORDER — HEPARIN SOD (PORK) LOCK FLUSH 100 UNIT/ML IV SOLN
500.0000 [IU] | Freq: Once | INTRAVENOUS | Status: AC
  Administered 2018-07-20: 500 [IU] via INTRAVENOUS
  Filled 2018-07-20: qty 5

## 2018-07-20 MED ORDER — SODIUM CHLORIDE 0.9 % IV SOLN
261.5000 mg | Freq: Once | INTRAVENOUS | Status: AC
  Administered 2018-07-20: 260 mg via INTRAVENOUS
  Filled 2018-07-20: qty 26

## 2018-07-20 MED ORDER — PALONOSETRON HCL INJECTION 0.25 MG/5ML
0.2500 mg | Freq: Once | INTRAVENOUS | Status: AC
  Administered 2018-07-20: 0.25 mg via INTRAVENOUS
  Filled 2018-07-20: qty 5

## 2018-07-20 MED ORDER — SODIUM CHLORIDE 0.9 % IV SOLN
Freq: Once | INTRAVENOUS | Status: AC
  Administered 2018-07-20: 12:00:00 via INTRAVENOUS
  Filled 2018-07-20: qty 5

## 2018-07-20 MED ORDER — SODIUM CHLORIDE 0.9 % IV SOLN
Freq: Once | INTRAVENOUS | Status: AC
  Administered 2018-07-20: 12:00:00 via INTRAVENOUS
  Filled 2018-07-20: qty 250

## 2018-07-20 MED ORDER — SODIUM CHLORIDE 0.9% FLUSH
10.0000 mL | Freq: Once | INTRAVENOUS | Status: AC
  Administered 2018-07-20: 10 mL via INTRAVENOUS
  Filled 2018-07-20: qty 10

## 2018-07-20 NOTE — Progress Notes (Signed)
Patient is here today for her malignant neoplasm of ovary. Patient stated that she has had some pain on her pelvic area at times "but nothing to worry". Patient stated that she has had some itching on her abdomen and lower extremities after chemo treatment. Patient stated that she had taken Tylenol a few times and it had helped. Patient stated that she had not taken anything as Benadryl or anti-itch cream but has applied lotion. Patient stated that it could also be that the weather change has caused dry skin.

## 2018-07-21 LAB — CA 125: Cancer Antigen (CA) 125: 100 U/mL — ABNORMAL HIGH (ref 0.0–38.1)

## 2018-07-21 NOTE — Progress Notes (Signed)
FYI. Ca 125 elevated from previous.

## 2018-07-22 ENCOUNTER — Inpatient Hospital Stay: Payer: Medicare Other

## 2018-07-22 DIAGNOSIS — C569 Malignant neoplasm of unspecified ovary: Secondary | ICD-10-CM

## 2018-07-22 MED ORDER — PEGFILGRASTIM-CBQV 6 MG/0.6ML ~~LOC~~ SOSY
6.0000 mg | PREFILLED_SYRINGE | Freq: Once | SUBCUTANEOUS | Status: AC
  Administered 2018-07-22: 6 mg via SUBCUTANEOUS

## 2018-08-12 NOTE — Progress Notes (Deleted)
[place]  Telephone:(336) [phone] Fax:(336) [phone]  ID: Tamara Mckinney OB: Jun 18, 1928  MR#: [id number]  ELF#:[id number]  Patient Care Team: [name], MD as PCP - General (Family Medicine) [name], RN as Registered Nurse Herbert Pun, MD as Consulting Physician (General Surgery)  CHIEF COMPLAINT: Stage IIIC ovarian cancer.  INTERVAL HISTORY: Patient returns to clinic today for further evaluation and consideration of cycle 4 of single agent carboplatinum.  She continues to tolerate her treatments well without significant side effects. She currently feels well and is asymptomatic.  She admits to occasional pelvic pain, but states this is well controlled with Tylenol.  She has no neurologic complaints. She has a fair appetite and denies weight loss.  She denies any recent fevers or illnesses.  She denies any chest pain or shortness of breath. She has no nausea, vomiting, constipation, or diarrhea. She has no urinary complaints.  Patient offers no further specific complaints today.  REVIEW OF SYSTEMS:   Review of Systems  Constitutional: Negative.  Negative for fever, malaise/fatigue and weight loss.  Respiratory: Negative.  Negative for cough and shortness of breath.   Cardiovascular: Negative.  Negative for chest pain and leg swelling.  Gastrointestinal: Negative.  Negative for abdominal pain, blood in stool, constipation, diarrhea, heartburn, melena, nausea and vomiting.  Genitourinary: Negative.  Negative for dysuria.  Musculoskeletal: Negative.  Negative for back pain.  Skin: Negative.  Negative for rash.  Neurological: Negative.  Negative for dizziness, focal weakness and weakness.  Endo/Heme/Allergies: Does not bruise/bleed easily.  Psychiatric/Behavioral: Negative.  The patient is not nervous/anxious.     As per HPI. Otherwise, a complete review of systems is negative.  PAST MEDICAL HISTORY: Past Medical History:  Diagnosis Date    . Anemia   . Arthritis   . Asthma   . Cancer ([place])   . Dyspnea   . GERD (gastroesophageal reflux disease)   . Hypertension     PAST SURGICAL HISTORY: Past Surgical History:  Procedure Laterality Date  . ABDOMINAL HYSTERECTOMY    . BREAST BIOPSY     x6  . BUNIONECTOMY Bilateral   . CATARACT EXTRACTION, BILATERAL    . FLEXIBLE SIGMOIDOSCOPY N/A 05/21/2017   Procedure: FLEXIBLE SIGMOIDOSCOPY;  Surgeon: Lin Landsman, MD;  Location: [facility] ENDOSCOPY;  Service: Gastroenterology;  Laterality: N/A;  . INCONTINENCE SURGERY    . PORTA CATH INSERTION N/A 06/16/2017   Procedure: PORTA CATH INSERTION;  Surgeon: Algernon Huxley, MD;  Location: [place] [facility] LAB;  Service: Cardiovascular;  Laterality: N/A;  . RECTAL SURGERY  04/2018  . REPAIR OF RECTAL PROLAPSE N/A 05/11/2017   Procedure: REPAIR OF RECTAL PROLAPSE;  Surgeon: Leonie Green, MD;  Location: [facility] ORS;  Service: General;  Laterality: N/A;  . TONSILLECTOMY    . VAGINA SURGERY     uncertain procedure performed    FAMILY HISTORY: Family History  Problem Relation Age of Onset  . Heart disease Mother   . Heart disease Father   . Heart disease Sister   . Heart attack Sister   . Ulcerative colitis Brother   . Lung cancer Brother   . Thyroid cancer Sister   . Asthma Sister   . Diabetes Sister   . Asthma Sister   . Pancreatic cancer Sister   . Dementia Sister   . Asthma Brother   . Heart disease Brother   . Asthma Brother   . Lung cancer Brother   . Asthma Brother   . Lung  cancer Brother   . Lung cancer Brother   . Rheum arthritis Brother     ADVANCED DIRECTIVES (Y/N):  N  HEALTH MAINTENANCE: Social History   Tobacco Use  . Smoking status: Never Smoker  . Smokeless tobacco: Never Used  Substance Use Topics  . Alcohol use: No  . Drug use: No     Colonoscopy:  PAP:  Bone density:  Lipid panel:  No Known Allergies  Current Outpatient Medications  Medication Sig Dispense Refill  . acetaminophen  (TYLENOL) 500 MG tablet Take 500 mg by mouth 2 (two) times daily as needed for moderate pain.     Marland Kitchen albuterol (PROVENTIL HFA;VENTOLIN HFA) 108 (90 Base) MCG/ACT inhaler Inhale 1-2 puffs into the lungs every 6 (six) hours as needed for wheezing or shortness of breath.    Marland Kitchen amLODipine (NORVASC) 5 MG tablet Take 5 mg by mouth daily as needed.     . Ascorbic Acid (VITAMIN C) 1000 MG tablet Take 1,000 mg by mouth daily.    . Calcium Carb-Cholecalciferol (CALTRATE 600+D) 600-800 MG-UNIT TABS Take 1 tablet by mouth daily.    . Carboxymeth-Glyc-Polysorb PF (REFRESH OPTIVE ADVANCED PF) 0.5-1-0.5 % SOLN Place 1-2 drops into both eyes 3 (three) times daily as needed. For dry, irritated eyes    . Carboxymethylcellulose Sod PF (REFRESH CELLUVISC) 1 % GEL Place 1 drop into both eyes at bedtime. In the middle of the night     . cetirizine (ZYRTEC) 10 MG tablet Take 10 mg by mouth at bedtime as needed for allergies.    . Cholecalciferol (VITAMIN D3) 2000 units capsule Take 2,000 Units by mouth daily.    . Cyanocobalamin (RA VITAMIN B12) 2000 MCG TBCR Take 2,000 mcg by mouth daily.    . cyclobenzaprine (FLEXERIL) 10 MG tablet TAKE ONE TABLET BY MOUTH EVERY NIGHT AT BEDTIME 30 tablet 1  . docusate sodium (COLACE) 100 MG capsule Take 100-300 mg by mouth at bedtime as needed for mild constipation.    Marland Kitchen esomeprazole (NEXIUM) 40 MG capsule Take 40 mg by mouth daily before breakfast.     . fluticasone furoate-vilanterol (BREO ELLIPTA) 100-25 MCG/INH AEPB Inhale 1 puff into the lungs as needed.     . furosemide (LASIX) 40 MG tablet Take 40 mg by mouth as needed.     Marland Kitchen ipratropium-albuterol (DUONEB) 0.5-2.5 (3) MG/3ML SOLN Take 3 mLs by nebulization every 6 (six) hours as needed. For wheezing/shortness of breath    . lidocaine-prilocaine (EMLA) cream Apply 1 application topically as needed. Apply small amount to port site at least 1 hour prior to it being accessed, cover with plastic wrap 30 g 1  . meclizine (ANTIVERT) 25  MG tablet Take 25 mg by mouth 3 (three) times daily as needed (for vertigo).     . montelukast (SINGULAIR) 10 MG tablet Take 10 mg by mouth at bedtime.    . polyethylene glycol (MIRALAX / GLYCOLAX) packet Take by mouth.    . valsartan (DIOVAN) 160 MG tablet Start 1/2 tab a day.  Can increase to 1 tab after a few weeks if BP stays high     No current facility-administered medications for this visit.    Facility-Administered Medications Ordered in Other Visits  Medication Dose Route Frequency Provider Last Rate Last Dose  . sodium chloride flush (NS) 0.9 % injection 10 mL  10 mL Intravenous PRN Lloyd Huger, MD   10 mL at 03/10/18 1200    OBJECTIVE: There were no vitals filed  for this visit.   There is no height or weight on file to calculate BMI.    ECOG FS:0 - Asymptomatic  General: Well-developed, well-nourished, no acute distress. Eyes: Pink conjunctiva, anicteric sclera. HEENT: Normocephalic, moist mucous membranes. Lungs: Clear to auscultation bilaterally. Heart: Regular rate and rhythm. No rubs, murmurs, or gallops. Abdomen: Soft, nontender, nondistended. No organomegaly noted, normoactive bowel sounds. Musculoskeletal: No edema, cyanosis, or clubbing. Neuro: Alert, answering all questions appropriately. Cranial nerves grossly intact. Skin: No rashes or petechiae noted. Psych: Normal affect.  LAB RESULTS:  Lab Results  Component Value Date   NA 134 (L) 07/20/2018   K 4.5 07/20/2018   CL 103 07/20/2018   CO2 24 07/20/2018   GLUCOSE 103 (H) 07/20/2018   BUN 28 (H) 07/20/2018   CREATININE 0.87 07/20/2018   CALCIUM 8.6 (L) 07/20/2018   PROT 6.8 07/20/2018   ALBUMIN 3.8 07/20/2018   AST 26 07/20/2018   ALT 13 07/20/2018   ALKPHOS 94 07/20/2018   BILITOT 0.6 07/20/2018   GFRNONAA 59 (L) 07/20/2018   GFRAA >60 07/20/2018    Lab Results  Component Value Date   WBC 6.6 07/20/2018   NEUTROABS 4.9 07/20/2018   HGB 9.2 (L) 07/20/2018   HCT 28.4 (L) 07/20/2018    MCV 98.6 07/20/2018   PLT 349 07/20/2018     STUDIES: No results found.  ASSESSMENT: Stage IIIC ovarian cancer.  PLAN:    1. Stage IIIC ovarian cancer: Patient now has recurrent disease.  CT scan results from May 06, 2018 reviewed independently with multifocal peritoneal metastasis increasing in size and number.  CA-125 has trended up slightly to 100. Although, combination therapy is recommended, given patient's advanced age, the toxicity was thought to be too high.  Because of this, will proceed once again with single agent carboplatinum.  Neulasta has been approved by insurance therefore we will add with the remainder of her treatments.  Proceed with cycle 4 of single agent carboplatin today.  Return to clinic in 2 days for Neulasta and then in 3 weeks for further evaluation and consideration of cycle 5.   2. Lupus anticoagulant: Patient was noted to have an elevated PTT as well as increased bleeding during her surgery for rectal prolapse. Continue to monitor closely and repeat lupus anticoagulant panel if patient has evidence of bleeding or requires additional procedures.   3. Anemia: Patient's hemoglobin is decreased, but stable at 9.2.  Iron stores are within normal limits.  Continue to monitor.   4.  Nausea: Patient does not complain of this today.   5.  Leukopenia: Resolved.  Neulasta as above.  Patient expressed understanding and was in agreement with this plan. She also understands that She can call clinic at any time with any questions, concerns, or complaints.   Cancer Staging Malignant neoplasm of ovary [facility]) Staging form: Ovary, Fallopian Tube, and Primary Peritoneal Carcinoma, AJCC 8th Edition - Clinical stage from 05/29/2017: Stage IIIC (cT3c, cN1b, cM0) - Signed by Lloyd Huger, MD on 05/29/2017   Lloyd Huger, MD   08/12/2018 11:10 AM

## 2018-08-15 ENCOUNTER — Telehealth: Payer: Self-pay | Admitting: *Deleted

## 2018-08-15 NOTE — Telephone Encounter (Signed)
Ok to delay one week.

## 2018-08-15 NOTE — Telephone Encounter (Signed)
Son called reporting that patient has a cold and is scheduled for chemotherapy tomorrow, he wants to know if we will be giving her chemotherapy with a cold, if not he does not want to bring her out. Please advise

## 2018-08-15 NOTE — Telephone Encounter (Signed)
Tamara Mckinney,  Can you please move these appointment to next Tuesday, son is expecting a call from you to move the appts

## 2018-08-16 ENCOUNTER — Inpatient Hospital Stay: Payer: Medicare Other

## 2018-08-16 ENCOUNTER — Inpatient Hospital Stay: Payer: Medicare Other | Admitting: Oncology

## 2018-08-19 ENCOUNTER — Other Ambulatory Visit: Payer: Self-pay | Admitting: Oncology

## 2018-08-19 NOTE — Progress Notes (Signed)
[place]  [phone] [phone]  ID: [name] OB: 1927/10/19  MR#: [id number]  UKG#:[id number]  Patient Care Team: [name], MD as PCP - General (Family Medicine) [name], RN as Registered Nurse Herbert Pun, MD as Consulting Physician (General Surgery)  CHIEF COMPLAINT: Stage IIIC ovarian cancer.  INTERVAL HISTORY: Patient returns to clinic today for further evaluation and consideration of cycle 5 of single agent carboplatinum.  She complains of right-sided neck pain today, but this is chronic and unchanged. She continues to tolerate her treatments well without significant side effects. She has no neurologic complaints. She has a fair appetite and denies weight loss.  She denies any recent fevers or illnesses.  She denies any chest pain or shortness of breath. She has no nausea, vomiting, constipation, or diarrhea. She has no urinary complaints.  Patient offers no further specific complaints today.    REVIEW OF SYSTEMS:   Review of Systems  Constitutional: Negative.  Negative for fever, malaise/fatigue and weight loss.  Respiratory: Negative.  Negative for cough and shortness of breath.   Cardiovascular: Negative.  Negative for chest pain and leg swelling.  Gastrointestinal: Negative.  Negative for abdominal pain, blood in stool, constipation, diarrhea, heartburn, melena, nausea and vomiting.  Genitourinary: Negative.  Negative for dysuria.  Musculoskeletal: Positive for neck pain. Negative for back pain.  Skin: Negative.  Negative for rash.  Neurological: Negative.  Negative for dizziness, focal weakness and weakness.  Endo/Heme/Allergies: Does not bruise/bleed easily.  Psychiatric/Behavioral: Negative.  The patient is not nervous/anxious.     As per HPI. Otherwise, a complete review of systems is negative.  PAST MEDICAL HISTORY: Past Medical History:  Diagnosis Date  . Anemia   . Arthritis   . Asthma     . Cancer ([place])   . Dyspnea   . GERD (gastroesophageal reflux disease)   . Hypertension     PAST SURGICAL HISTORY: Past Surgical History:  Procedure Laterality Date  . ABDOMINAL HYSTERECTOMY    . BREAST BIOPSY     x6  . BUNIONECTOMY Bilateral   . CATARACT EXTRACTION, BILATERAL    . FLEXIBLE SIGMOIDOSCOPY N/A 05/21/2017   Procedure: FLEXIBLE SIGMOIDOSCOPY;  Surgeon: Lin Landsman, MD;  Location: [facility] ENDOSCOPY;  Service: Gastroenterology;  Laterality: N/A;  . INCONTINENCE SURGERY    . PORTA CATH INSERTION N/A 06/16/2017   Procedure: PORTA CATH INSERTION;  Surgeon: Algernon Huxley, MD;  Location: [facility] LAB;  Service: Cardiovascular;  Laterality: N/A;  . RECTAL SURGERY  04/2018  . REPAIR OF RECTAL PROLAPSE N/A 05/11/2017   Procedure: REPAIR OF RECTAL PROLAPSE;  Surgeon: Leonie Green, MD;  Location: [facility] ORS;  Service: General;  Laterality: N/A;  . TONSILLECTOMY    . VAGINA SURGERY     uncertain procedure performed    FAMILY HISTORY: Family History  Problem Relation Age of Onset  . Heart disease Mother   . Heart disease Father   . Heart disease Sister   . Heart attack Sister   . Ulcerative colitis Brother   . Lung cancer Brother   . Thyroid cancer Sister   . Asthma Sister   . Diabetes Sister   . Asthma Sister   . Pancreatic cancer Sister   . Dementia Sister   . Asthma Brother   . Heart disease Brother   . Asthma Brother   . Lung cancer Brother   . Asthma Brother   . Lung cancer Brother   . Lung  cancer Brother   . Rheum arthritis Brother     ADVANCED DIRECTIVES (Y/N):  N  HEALTH MAINTENANCE: Social History   Tobacco Use  . Smoking status: Never Smoker  . Smokeless tobacco: Never Used  Substance Use Topics  . Alcohol use: No  . Drug use: No     Colonoscopy:  PAP:  Bone density:  Lipid panel:  No Known Allergies  Current Outpatient Medications  Medication Sig Dispense Refill  . acetaminophen (TYLENOL) 500 MG tablet Take 500 mg by  mouth 2 (two) times daily as needed for moderate pain.     Marland Kitchen albuterol (PROVENTIL HFA;VENTOLIN HFA) 108 (90 Base) MCG/ACT inhaler Inhale 1-2 puffs into the lungs every 6 (six) hours as needed for wheezing or shortness of breath.    Marland Kitchen amLODipine (NORVASC) 5 MG tablet Take 5 mg by mouth daily as needed.     . Ascorbic Acid (VITAMIN C) 1000 MG tablet Take 1,000 mg by mouth daily.    . Calcium Carb-Cholecalciferol (CALTRATE 600+D) 600-800 MG-UNIT TABS Take 1 tablet by mouth daily.    . Carboxymeth-Glyc-Polysorb PF (REFRESH OPTIVE ADVANCED PF) 0.5-1-0.5 % SOLN Place 1-2 drops into both eyes 3 (three) times daily as needed. For dry, irritated eyes    . Carboxymethylcellulose Sod PF (REFRESH CELLUVISC) 1 % GEL Place 1 drop into both eyes at bedtime. In the middle of the night     . cetirizine (ZYRTEC) 10 MG tablet Take 10 mg by mouth at bedtime as needed for allergies.    . Cholecalciferol (VITAMIN D3) 2000 units capsule Take 2,000 Units by mouth daily.    . Cyanocobalamin (RA VITAMIN B12) 2000 MCG TBCR Take 2,000 mcg by mouth daily.    . cyclobenzaprine (FLEXERIL) 10 MG tablet TAKE ONE TABLET BY MOUTH EVERY NIGHT AT BEDTIME 30 tablet 1  . docusate sodium (COLACE) 100 MG capsule Take 100-300 mg by mouth at bedtime as needed for mild constipation.    Marland Kitchen esomeprazole (NEXIUM) 40 MG capsule Take 40 mg by mouth daily before breakfast.     . fluticasone furoate-vilanterol (BREO ELLIPTA) 100-25 MCG/INH AEPB Inhale 1 puff into the lungs as needed.     . furosemide (LASIX) 40 MG tablet Take 40 mg by mouth as needed.     Marland Kitchen ipratropium-albuterol (DUONEB) 0.5-2.5 (3) MG/3ML SOLN Take 3 mLs by nebulization every 6 (six) hours as needed. For wheezing/shortness of breath    . lidocaine-prilocaine (EMLA) cream Apply 1 application topically as needed. Apply small amount to port site at least 1 hour prior to it being accessed, cover with plastic wrap 30 g 1  . meclizine (ANTIVERT) 25 MG tablet Take 25 mg by mouth 3 (three)  times daily as needed (for vertigo).     . montelukast (SINGULAIR) 10 MG tablet Take 10 mg by mouth at bedtime.    . polyethylene glycol (MIRALAX / GLYCOLAX) packet Take by mouth.    . valsartan (DIOVAN) 160 MG tablet Start 1/2 tab a day.  Can increase to 1 tab after a few weeks if BP stays high     No current facility-administered medications for this visit.    Facility-Administered Medications Ordered in Other Visits  Medication Dose Route Frequency Provider Last Rate Last Dose  . sodium chloride flush (NS) 0.9 % injection 10 mL  10 mL Intravenous PRN Lloyd Huger, MD   10 mL at 03/10/18 1200    OBJECTIVE: Vitals:   08/25/18 1011  BP: (!) 157/88  Pulse:  100  Temp: 98.7 F (37.1 C)     Body mass index is 21.64 kg/m.    ECOG FS:0 - Asymptomatic  General: Thin, no acute distress. Eyes: Pink conjunctiva, anicteric sclera. HEENT: Normocephalic, moist mucous membranes. Lungs: Clear to auscultation bilaterally. Heart: Regular rate and rhythm. No rubs, murmurs, or gallops. Abdomen: Soft, nontender, nondistended. No organomegaly noted, normoactive bowel sounds. Musculoskeletal: No edema, cyanosis, or clubbing. Neuro: Alert, answering all questions appropriately. Cranial nerves grossly intact. Skin: No rashes or petechiae noted. Psych: Normal affect.  LAB RESULTS:  Lab Results  Component Value Date   NA 131 (L) 08/25/2018   K 4.6 08/25/2018   CL 100 08/25/2018   CO2 23 08/25/2018   GLUCOSE 152 (H) 08/25/2018   BUN 22 08/25/2018   CREATININE 0.90 08/25/2018   CALCIUM 8.3 (L) 08/25/2018   PROT 6.3 (L) 08/25/2018   ALBUMIN 3.7 08/25/2018   AST 26 08/25/2018   ALT 12 08/25/2018   ALKPHOS 102 08/25/2018   BILITOT 0.5 08/25/2018   GFRNONAA 57 (L) 08/25/2018   GFRAA >60 08/25/2018    Lab Results  Component Value Date   WBC 4.2 08/25/2018   NEUTROABS 3.0 08/25/2018   HGB 8.6 (L) 08/25/2018   HCT 27.2 (L) 08/25/2018   MCV 100.7 (H) 08/25/2018   PLT 220 08/25/2018       STUDIES: No results found.  ASSESSMENT: Stage IIIC ovarian cancer.  PLAN:    1. Stage IIIC ovarian cancer: Patient now has recurrent disease.  CT scan results from May 06, 2018 reviewed independently with multifocal peritoneal metastasis increasing in size and number.  CA-125 has trended up slightly to 100, today's result is pending. Although, combination therapy is recommended, given patient's advanced age, the toxicity was thought to be too high.  Because of this, will proceed once again with single agent carboplatinum.  Neulasta has been approved by insurance therefore we will add with the remainder of her treatments.  Proceed with cycle 5 of single agent carboplatin today.  If CA-125 continues to trend up, will consider adding low-dose Taxol with subsequent treatments.  Return to clinic in 2 days for Neulasta and then in 3 weeks for further evaluation and consideration of cycle 6.    2. Lupus anticoagulant: Patient was noted to have an elevated PTT as well as increased bleeding during her surgery for rectal prolapse. Continue to monitor closely and repeat lupus anticoagulant panel if patient has evidence of bleeding or requires additional procedures.   3. Anemia: Patient's hemoglobin has trended down slightly to 8.6.  Iron stores are within normal limits.  She does not require transfusion at this time, but will consider one in the future if necessary. 4.  Nausea: Patient does not complain of this today.   5.  Leukopenia: Resolved.  Neulasta as above.  Patient expressed understanding and was in agreement with this plan. She also understands that She can call clinic at any time with any questions, concerns, or complaints.   Cancer Staging Malignant neoplasm of ovary [facility]) Staging form: Ovary, Fallopian Tube, and Primary Peritoneal Carcinoma, AJCC 8th Edition - Clinical stage from 05/29/2017: Stage IIIC (cT3c, cN1b, cM0) - Signed by Lloyd Huger, MD on 05/29/2017   Lloyd Huger, MD   08/26/2018 2:29 PM

## 2018-08-23 ENCOUNTER — Ambulatory Visit: Payer: Medicare Other | Admitting: Oncology

## 2018-08-23 ENCOUNTER — Ambulatory Visit: Payer: Medicare Other

## 2018-08-23 ENCOUNTER — Other Ambulatory Visit: Payer: Medicare Other

## 2018-08-25 ENCOUNTER — Encounter (INDEPENDENT_AMBULATORY_CARE_PROVIDER_SITE_OTHER): Payer: Self-pay

## 2018-08-25 ENCOUNTER — Inpatient Hospital Stay: Payer: Medicare Other | Attending: Oncology

## 2018-08-25 ENCOUNTER — Inpatient Hospital Stay (HOSPITAL_BASED_OUTPATIENT_CLINIC_OR_DEPARTMENT_OTHER): Payer: Medicare Other | Admitting: Oncology

## 2018-08-25 ENCOUNTER — Other Ambulatory Visit: Payer: Self-pay

## 2018-08-25 ENCOUNTER — Inpatient Hospital Stay: Payer: Medicare Other

## 2018-08-25 VITALS — BP 157/88 | HR 100 | Temp 98.7°F | Wt 100.0 lb

## 2018-08-25 DIAGNOSIS — M5136 Other intervertebral disc degeneration, lumbar region: Secondary | ICD-10-CM | POA: Insufficient documentation

## 2018-08-25 DIAGNOSIS — C569 Malignant neoplasm of unspecified ovary: Secondary | ICD-10-CM

## 2018-08-25 DIAGNOSIS — M51369 Other intervertebral disc degeneration, lumbar region without mention of lumbar back pain or lower extremity pain: Secondary | ICD-10-CM | POA: Insufficient documentation

## 2018-08-25 DIAGNOSIS — D649 Anemia, unspecified: Secondary | ICD-10-CM | POA: Insufficient documentation

## 2018-08-25 DIAGNOSIS — D6862 Lupus anticoagulant syndrome: Secondary | ICD-10-CM | POA: Insufficient documentation

## 2018-08-25 DIAGNOSIS — M542 Cervicalgia: Secondary | ICD-10-CM | POA: Diagnosis not present

## 2018-08-25 DIAGNOSIS — Z90722 Acquired absence of ovaries, bilateral: Secondary | ICD-10-CM | POA: Insufficient documentation

## 2018-08-25 DIAGNOSIS — I10 Essential (primary) hypertension: Secondary | ICD-10-CM | POA: Insufficient documentation

## 2018-08-25 DIAGNOSIS — K219 Gastro-esophageal reflux disease without esophagitis: Secondary | ICD-10-CM | POA: Diagnosis not present

## 2018-08-25 DIAGNOSIS — Z801 Family history of malignant neoplasm of trachea, bronchus and lung: Secondary | ICD-10-CM | POA: Insufficient documentation

## 2018-08-25 DIAGNOSIS — M419 Scoliosis, unspecified: Secondary | ICD-10-CM | POA: Insufficient documentation

## 2018-08-25 DIAGNOSIS — M1990 Unspecified osteoarthritis, unspecified site: Secondary | ICD-10-CM | POA: Insufficient documentation

## 2018-08-25 DIAGNOSIS — R109 Unspecified abdominal pain: Secondary | ICD-10-CM | POA: Diagnosis not present

## 2018-08-25 DIAGNOSIS — Z5111 Encounter for antineoplastic chemotherapy: Secondary | ICD-10-CM | POA: Diagnosis not present

## 2018-08-25 DIAGNOSIS — Z800 Family history of malignant neoplasm of digestive organs: Secondary | ICD-10-CM | POA: Diagnosis not present

## 2018-08-25 DIAGNOSIS — Z7689 Persons encountering health services in other specified circumstances: Secondary | ICD-10-CM | POA: Insufficient documentation

## 2018-08-25 DIAGNOSIS — Z90710 Acquired absence of both cervix and uterus: Secondary | ICD-10-CM

## 2018-08-25 DIAGNOSIS — R140 Abdominal distension (gaseous): Secondary | ICD-10-CM | POA: Diagnosis not present

## 2018-08-25 DIAGNOSIS — Z79899 Other long term (current) drug therapy: Secondary | ICD-10-CM | POA: Diagnosis not present

## 2018-08-25 DIAGNOSIS — G8929 Other chronic pain: Secondary | ICD-10-CM | POA: Insufficient documentation

## 2018-08-25 DIAGNOSIS — C786 Secondary malignant neoplasm of retroperitoneum and peritoneum: Secondary | ICD-10-CM | POA: Diagnosis not present

## 2018-08-25 DIAGNOSIS — H269 Unspecified cataract: Secondary | ICD-10-CM | POA: Insufficient documentation

## 2018-08-25 DIAGNOSIS — M797 Fibromyalgia: Secondary | ICD-10-CM | POA: Insufficient documentation

## 2018-08-25 LAB — CBC WITH DIFFERENTIAL/PLATELET
Abs Immature Granulocytes: 0.01 10*3/uL (ref 0.00–0.07)
Basophils Absolute: 0 10*3/uL (ref 0.0–0.1)
Basophils Relative: 0 %
Eosinophils Absolute: 0.1 10*3/uL (ref 0.0–0.5)
Eosinophils Relative: 1 %
HCT: 27.2 % — ABNORMAL LOW (ref 36.0–46.0)
Hemoglobin: 8.6 g/dL — ABNORMAL LOW (ref 12.0–15.0)
Immature Granulocytes: 0 %
Lymphocytes Relative: 17 %
Lymphs Abs: 0.7 10*3/uL (ref 0.7–4.0)
MCH: 31.9 pg (ref 26.0–34.0)
MCHC: 31.6 g/dL (ref 30.0–36.0)
MCV: 100.7 fL — ABNORMAL HIGH (ref 80.0–100.0)
Monocytes Absolute: 0.4 10*3/uL (ref 0.1–1.0)
Monocytes Relative: 8 %
Neutro Abs: 3 10*3/uL (ref 1.7–7.7)
Neutrophils Relative %: 74 %
Platelets: 220 10*3/uL (ref 150–400)
RBC: 2.7 MIL/uL — ABNORMAL LOW (ref 3.87–5.11)
RDW: 15.6 % — ABNORMAL HIGH (ref 11.5–15.5)
WBC: 4.2 10*3/uL (ref 4.0–10.5)
nRBC: 0 % (ref 0.0–0.2)

## 2018-08-25 LAB — COMPREHENSIVE METABOLIC PANEL
ALT: 12 U/L (ref 0–44)
AST: 26 U/L (ref 15–41)
Albumin: 3.7 g/dL (ref 3.5–5.0)
Alkaline Phosphatase: 102 U/L (ref 38–126)
Anion gap: 8 (ref 5–15)
BUN: 22 mg/dL (ref 8–23)
CO2: 23 mmol/L (ref 22–32)
Calcium: 8.3 mg/dL — ABNORMAL LOW (ref 8.9–10.3)
Chloride: 100 mmol/L (ref 98–111)
Creatinine, Ser: 0.9 mg/dL (ref 0.44–1.00)
GFR calc Af Amer: >60 mL/min (ref >60)
GFR calc non Af Amer: 57 mL/min — ABNORMAL LOW (ref >60)
Glucose, Bld: 152 mg/dL — ABNORMAL HIGH (ref 70–99)
Potassium: 4.6 mmol/L (ref 3.5–5.1)
Sodium: 131 mmol/L — ABNORMAL LOW (ref 135–145)
Total Bilirubin: 0.5 mg/dL (ref 0.3–1.2)
Total Protein: 6.3 g/dL — ABNORMAL LOW (ref 6.5–8.1)

## 2018-08-25 MED ORDER — HEPARIN SOD (PORK) LOCK FLUSH 100 UNIT/ML IV SOLN
500.0000 [IU] | Freq: Once | INTRAVENOUS | Status: AC | PRN
  Administered 2018-08-25: 500 [IU]
  Filled 2018-08-25 (×2): qty 5

## 2018-08-25 MED ORDER — SODIUM CHLORIDE 0.9 % IV SOLN
260.0000 mg | Freq: Once | INTRAVENOUS | Status: AC
  Administered 2018-08-25: 260 mg via INTRAVENOUS
  Filled 2018-08-25: qty 26

## 2018-08-25 MED ORDER — PALONOSETRON HCL INJECTION 0.25 MG/5ML
0.2500 mg | Freq: Once | INTRAVENOUS | Status: AC
  Administered 2018-08-25: 0.25 mg via INTRAVENOUS
  Filled 2018-08-25: qty 5

## 2018-08-25 MED ORDER — SODIUM CHLORIDE 0.9 % IV SOLN
Freq: Once | INTRAVENOUS | Status: AC
  Administered 2018-08-25: 11:00:00 via INTRAVENOUS
  Filled 2018-08-25: qty 5

## 2018-08-25 MED ORDER — SODIUM CHLORIDE 0.9 % IV SOLN
Freq: Once | INTRAVENOUS | Status: AC
  Administered 2018-08-25: 11:00:00 via INTRAVENOUS
  Filled 2018-08-25: qty 250

## 2018-08-25 NOTE — Progress Notes (Signed)
Patient is here today to follow up on her malignant neoplasm of ovary, unspecified laterality. Patient stated that she continues to have pain on her right side of her neck.

## 2018-09-11 NOTE — Progress Notes (Signed)
[place]  [phone]) [phone] Fax:(336) [phone]  ID: Tamara Mckinney OB: 02/14/1928  MR#: [id number]  IEP#:[id number]  Patient Care Team: [name], MD as PCP - General (Family Medicine) [name], RN as Registered Nurse Herbert Pun, MD as Consulting Physician (General Surgery)  CHIEF COMPLAINT: Stage IIIC ovarian cancer.  INTERVAL HISTORY: Patient returns to clinic today for further evaluation and consideration of cycle 6 of single agent carboplatinum.  She currently feels well and is asymptomatic.  She has occasional abdominal discomfort and bloating, but none currently.  She continues to have occasional neck pain.  She has no neurologic complaints. She has a fair appetite and denies weight loss.  She denies any recent fevers or illnesses.  She denies any chest pain or shortness of breath. She has no nausea, vomiting, constipation, or diarrhea. She has no urinary complaints.  Patient offers no further specific complaints today.  REVIEW OF SYSTEMS:   Review of Systems  Constitutional: Negative.  Negative for fever, malaise/fatigue and weight loss.  Respiratory: Negative.  Negative for cough and shortness of breath.   Cardiovascular: Negative.  Negative for chest pain and leg swelling.  Gastrointestinal: Negative.  Negative for abdominal pain, blood in stool, constipation, diarrhea, heartburn, melena, nausea and vomiting.  Genitourinary: Negative.  Negative for dysuria.  Musculoskeletal: Positive for neck pain. Negative for back pain.  Skin: Negative.  Negative for rash.  Neurological: Negative.  Negative for dizziness, focal weakness and weakness.  Endo/Heme/Allergies: Does not bruise/bleed easily.  Psychiatric/Behavioral: Negative.  The patient is not nervous/anxious.     As per HPI. Otherwise, a complete review of systems is negative.  PAST MEDICAL HISTORY: Past Medical History:  Diagnosis Date  . Anemia   . Arthritis   .  Asthma   . Cancer ([place])   . Dyspnea   . GERD (gastroesophageal reflux disease)   . Hypertension     PAST SURGICAL HISTORY: Past Surgical History:  Procedure Laterality Date  . ABDOMINAL HYSTERECTOMY    . BREAST BIOPSY     x6  . BUNIONECTOMY Bilateral   . CATARACT EXTRACTION, BILATERAL    . FLEXIBLE SIGMOIDOSCOPY N/A 05/21/2017   Procedure: FLEXIBLE SIGMOIDOSCOPY;  Surgeon: Lin Landsman, MD;  Location: [facility] ENDOSCOPY;  Service: Gastroenterology;  Laterality: N/A;  . INCONTINENCE SURGERY    . PORTA CATH INSERTION N/A 06/16/2017   Procedure: PORTA CATH INSERTION;  Surgeon: Algernon Huxley, MD;  Location: [place] [facility] LAB;  Service: Cardiovascular;  Laterality: N/A;  . RECTAL SURGERY  04/2018  . REPAIR OF RECTAL PROLAPSE N/A 05/11/2017   Procedure: REPAIR OF RECTAL PROLAPSE;  Surgeon: Leonie Green, MD;  Location: [facility] ORS;  Service: General;  Laterality: N/A;  . TONSILLECTOMY    . VAGINA SURGERY     uncertain procedure performed    FAMILY HISTORY: Family History  Problem Relation Age of Onset  . Heart disease Mother   . Heart disease Father   . Heart disease Sister   . Heart attack Sister   . Ulcerative colitis Brother   . Lung cancer Brother   . Thyroid cancer Sister   . Asthma Sister   . Diabetes Sister   . Asthma Sister   . Pancreatic cancer Sister   . Dementia Sister   . Asthma Brother   . Heart disease Brother   . Asthma Brother   . Lung cancer Brother   . Asthma Brother   . Lung cancer Brother   . Lung  cancer Brother   . Rheum arthritis Brother     ADVANCED DIRECTIVES (Y/N):  N  HEALTH MAINTENANCE: Social History   Tobacco Use  . Smoking status: Never Smoker  . Smokeless tobacco: Never Used  Substance Use Topics  . Alcohol use: No  . Drug use: No     Colonoscopy:  PAP:  Bone density:  Lipid panel:  No Known Allergies  Current Outpatient Medications  Medication Sig Dispense Refill  . acetaminophen (TYLENOL) 500 MG tablet Take  500 mg by mouth 2 (two) times daily as needed for moderate pain.     Marland Kitchen albuterol (PROVENTIL HFA;VENTOLIN HFA) 108 (90 Base) MCG/ACT inhaler Inhale 1-2 puffs into the lungs every 6 (six) hours as needed for wheezing or shortness of breath.    Marland Kitchen amLODipine (NORVASC) 5 MG tablet Take 5 mg by mouth daily as needed.     . Ascorbic Acid (VITAMIN C) 1000 MG tablet Take 1,000 mg by mouth daily.    . Calcium Carb-Cholecalciferol (CALTRATE 600+D) 600-800 MG-UNIT TABS Take 1 tablet by mouth daily.    . Carboxymeth-Glyc-Polysorb PF (REFRESH OPTIVE ADVANCED PF) 0.5-1-0.5 % SOLN Place 1-2 drops into both eyes 3 (three) times daily as needed. For dry, irritated eyes    . Carboxymethylcellulose Sod PF (REFRESH CELLUVISC) 1 % GEL Place 1 drop into both eyes at bedtime. In the middle of the night     . cetirizine (ZYRTEC) 10 MG tablet Take 10 mg by mouth at bedtime as needed for allergies.    . Cholecalciferol (VITAMIN D3) 2000 units capsule Take 2,000 Units by mouth daily.    . Cyanocobalamin (RA VITAMIN B12) 2000 MCG TBCR Take 2,000 mcg by mouth daily.    . cyclobenzaprine (FLEXERIL) 10 MG tablet TAKE ONE TABLET BY MOUTH EVERY NIGHT AT BEDTIME 30 tablet 1  . docusate sodium (COLACE) 100 MG capsule Take 100-300 mg by mouth at bedtime as needed for mild constipation.    Marland Kitchen esomeprazole (NEXIUM) 40 MG capsule Take 40 mg by mouth daily before breakfast.     . fluticasone furoate-vilanterol (BREO ELLIPTA) 100-25 MCG/INH AEPB Inhale 1 puff into the lungs as needed.     . furosemide (LASIX) 40 MG tablet Take 40 mg by mouth as needed.     Marland Kitchen ipratropium-albuterol (DUONEB) 0.5-2.5 (3) MG/3ML SOLN Take 3 mLs by nebulization every 6 (six) hours as needed. For wheezing/shortness of breath    . lidocaine-prilocaine (EMLA) cream Apply 1 application topically as needed. Apply small amount to port site at least 1 hour prior to it being accessed, cover with plastic wrap 30 g 1  . meclizine (ANTIVERT) 25 MG tablet Take 25 mg by mouth  3 (three) times daily as needed (for vertigo).     . montelukast (SINGULAIR) 10 MG tablet Take 10 mg by mouth at bedtime.    . polyethylene glycol (MIRALAX / GLYCOLAX) packet Take by mouth.    . valsartan (DIOVAN) 160 MG tablet Start 1/2 tab a day.  Can increase to 1 tab after a few weeks if BP stays high    . cyclobenzaprine (FLEXERIL) 10 MG tablet Take 1 tablet (10 mg total) by mouth 3 (three) times daily as needed for muscle spasms. 30 tablet 1   No current facility-administered medications for this visit.    Facility-Administered Medications Ordered in Other Visits  Medication Dose Route Frequency Provider Last Rate Last Dose  . sodium chloride flush (NS) 0.9 % injection 10 mL  10 mL Intravenous  PRN Lloyd Huger, MD   10 mL at 03/10/18 1200    OBJECTIVE: Vitals:   09/15/18 1021  BP: (!) 155/93  Pulse: 95  Temp: 97.6 F (36.4 C)     Body mass index is 21.7 kg/m.    ECOG FS:0 - Asymptomatic  General: Thin, no acute distress. Eyes: Pink conjunctiva, anicteric sclera. HEENT: Normocephalic, moist mucous membranes, clear oropharnyx. Lungs: Clear to auscultation bilaterally. Heart: Regular rate and rhythm. No rubs, murmurs, or gallops. Abdomen: Soft, nontender, nondistended. No organomegaly noted, normoactive bowel sounds. Musculoskeletal: No edema, cyanosis, or clubbing. Neuro: Alert, answering all questions appropriately. Cranial nerves grossly intact. Skin: No rashes or petechiae noted. Psych: Normal affect.  LAB RESULTS:  Lab Results  Component Value Date   NA 135 09/15/2018   K 4.5 09/15/2018   CL 104 09/15/2018   CO2 23 09/15/2018   GLUCOSE 93 09/15/2018   BUN 26 (H) 09/15/2018   CREATININE 0.87 09/15/2018   CALCIUM 8.7 (L) 09/15/2018   PROT 6.6 09/15/2018   ALBUMIN 3.8 09/15/2018   AST 30 09/15/2018   ALT 14 09/15/2018   ALKPHOS 90 09/15/2018   BILITOT 0.4 09/15/2018   GFRNONAA 59 (L) 09/15/2018   GFRAA >60 09/15/2018    Lab Results  Component Value  Date   WBC 3.1 (L) 09/15/2018   NEUTROABS 2.0 09/15/2018   HGB 9.2 (L) 09/15/2018   HCT 28.2 (L) 09/15/2018   MCV 102.9 (H) 09/15/2018   PLT 170 09/15/2018     STUDIES: No results found.  ASSESSMENT: Stage IIIC ovarian cancer.  PLAN:    1. Stage IIIC ovarian cancer: Patient now has recurrent disease.  CT scan results from May 06, 2018 reviewed independently with multifocal peritoneal metastasis increasing in size and number.  Patient's CA-125 continues to trend up and is now 133.0.  Will continue with cycle 6 of carboplatin today, but when patient returns to clinic in 3 weeks will add in weekly Taxol.  Continue to monitor closely given her advanced age and potential increased toxicity. Return to clinic in 2 days for Neulasta and then in 3 weeks for further evaluation and consideration cycle 7 which will be carboplatinum and Taxol.    2. Lupus anticoagulant: Patient was noted to have an elevated PTT as well as increased bleeding during her surgery for rectal prolapse. Continue to monitor closely and repeat lupus anticoagulant panel if patient has evidence of bleeding or requires additional procedures.   3. Anemia: Patient's hemoglobin is decreased, but stable at 9.2.  Iron stores are within normal limits.  She does not require transfusion at this time, but will consider one in the future if necessary. 4.  Nausea: Patient does not complain of this today.   5.  Leukopenia: Mild, monitor.  Neulasta as above.  Patient expressed understanding and was in agreement with this plan. She also understands that She can call clinic at any time with any questions, concerns, or complaints.   Cancer Staging Malignant neoplasm of ovary [facility]) Staging form: Ovary, Fallopian Tube, and Primary Peritoneal Carcinoma, AJCC 8th Edition - Clinical stage from 05/29/2017: Stage IIIC (cT3c, cN1b, cM0) - Signed by Lloyd Huger, MD on 05/29/2017   Lloyd Huger, MD   09/16/2018 8:22 AM

## 2018-09-13 ENCOUNTER — Other Ambulatory Visit: Payer: Self-pay | Admitting: Oncology

## 2018-09-15 ENCOUNTER — Inpatient Hospital Stay: Payer: Medicare Other

## 2018-09-15 ENCOUNTER — Other Ambulatory Visit: Payer: Self-pay

## 2018-09-15 ENCOUNTER — Inpatient Hospital Stay (HOSPITAL_BASED_OUTPATIENT_CLINIC_OR_DEPARTMENT_OTHER): Payer: Medicare Other | Admitting: Oncology

## 2018-09-15 VITALS — BP 155/93 | HR 95 | Temp 97.6°F | Wt 100.3 lb

## 2018-09-15 DIAGNOSIS — Z90722 Acquired absence of ovaries, bilateral: Secondary | ICD-10-CM | POA: Diagnosis not present

## 2018-09-15 DIAGNOSIS — I10 Essential (primary) hypertension: Secondary | ICD-10-CM

## 2018-09-15 DIAGNOSIS — Z90710 Acquired absence of both cervix and uterus: Secondary | ICD-10-CM

## 2018-09-15 DIAGNOSIS — R109 Unspecified abdominal pain: Secondary | ICD-10-CM

## 2018-09-15 DIAGNOSIS — D6862 Lupus anticoagulant syndrome: Secondary | ICD-10-CM

## 2018-09-15 DIAGNOSIS — M542 Cervicalgia: Secondary | ICD-10-CM

## 2018-09-15 DIAGNOSIS — C786 Secondary malignant neoplasm of retroperitoneum and peritoneum: Secondary | ICD-10-CM

## 2018-09-15 DIAGNOSIS — C569 Malignant neoplasm of unspecified ovary: Secondary | ICD-10-CM

## 2018-09-15 DIAGNOSIS — K219 Gastro-esophageal reflux disease without esophagitis: Secondary | ICD-10-CM

## 2018-09-15 DIAGNOSIS — D649 Anemia, unspecified: Secondary | ICD-10-CM

## 2018-09-15 DIAGNOSIS — R140 Abdominal distension (gaseous): Secondary | ICD-10-CM

## 2018-09-15 DIAGNOSIS — Z79899 Other long term (current) drug therapy: Secondary | ICD-10-CM

## 2018-09-15 DIAGNOSIS — G8929 Other chronic pain: Secondary | ICD-10-CM

## 2018-09-15 DIAGNOSIS — Z800 Family history of malignant neoplasm of digestive organs: Secondary | ICD-10-CM

## 2018-09-15 DIAGNOSIS — M1990 Unspecified osteoarthritis, unspecified site: Secondary | ICD-10-CM

## 2018-09-15 DIAGNOSIS — Z801 Family history of malignant neoplasm of trachea, bronchus and lung: Secondary | ICD-10-CM

## 2018-09-15 LAB — COMPREHENSIVE METABOLIC PANEL
ALT: 14 U/L (ref 0–44)
AST: 30 U/L (ref 15–41)
Albumin: 3.8 g/dL (ref 3.5–5.0)
Alkaline Phosphatase: 90 U/L (ref 38–126)
Anion gap: 8 (ref 5–15)
BUN: 26 mg/dL — ABNORMAL HIGH (ref 8–23)
CO2: 23 mmol/L (ref 22–32)
Calcium: 8.7 mg/dL — ABNORMAL LOW (ref 8.9–10.3)
Chloride: 104 mmol/L (ref 98–111)
Creatinine, Ser: 0.87 mg/dL (ref 0.44–1.00)
GFR calc Af Amer: >60 mL/min (ref >60)
GFR calc non Af Amer: 59 mL/min — ABNORMAL LOW (ref >60)
Glucose, Bld: 93 mg/dL (ref 70–99)
Potassium: 4.5 mmol/L (ref 3.5–5.1)
Sodium: 135 mmol/L (ref 135–145)
Total Bilirubin: 0.4 mg/dL (ref 0.3–1.2)
Total Protein: 6.6 g/dL (ref 6.5–8.1)

## 2018-09-15 LAB — CBC WITH DIFFERENTIAL/PLATELET
Abs Immature Granulocytes: 0.01 10*3/uL (ref 0.00–0.07)
Basophils Absolute: 0 10*3/uL (ref 0.0–0.1)
Basophils Relative: 0 %
Eosinophils Absolute: 0 10*3/uL (ref 0.0–0.5)
Eosinophils Relative: 1 %
HCT: 28.2 % — ABNORMAL LOW (ref 36.0–46.0)
Hemoglobin: 9.2 g/dL — ABNORMAL LOW (ref 12.0–15.0)
Immature Granulocytes: 0 %
Lymphocytes Relative: 21 %
Lymphs Abs: 0.6 10*3/uL — ABNORMAL LOW (ref 0.7–4.0)
MCH: 33.6 pg (ref 26.0–34.0)
MCHC: 32.6 g/dL (ref 30.0–36.0)
MCV: 102.9 fL — ABNORMAL HIGH (ref 80.0–100.0)
Monocytes Absolute: 0.3 10*3/uL (ref 0.1–1.0)
Monocytes Relative: 11 %
Neutro Abs: 2 10*3/uL (ref 1.7–7.7)
Neutrophils Relative %: 67 %
Platelets: 170 10*3/uL (ref 150–400)
RBC: 2.74 MIL/uL — ABNORMAL LOW (ref 3.87–5.11)
RDW: 14.7 % (ref 11.5–15.5)
WBC: 3.1 10*3/uL — ABNORMAL LOW (ref 4.0–10.5)
nRBC: 0 % (ref 0.0–0.2)

## 2018-09-15 MED ORDER — CYCLOBENZAPRINE HCL 10 MG PO TABS: 10.0000 mg | ORAL_TABLET | Freq: Three times a day (TID) | ORAL | 1 refills | Status: DC | PRN

## 2018-09-15 MED ORDER — SODIUM CHLORIDE 0.9 % IV SOLN
Freq: Once | INTRAVENOUS | Status: AC
  Administered 2018-09-15: 11:00:00 via INTRAVENOUS
  Filled 2018-09-15: qty 250

## 2018-09-15 MED ORDER — PALONOSETRON HCL INJECTION 0.25 MG/5ML
0.2500 mg | Freq: Once | INTRAVENOUS | Status: AC
  Administered 2018-09-15: 0.25 mg via INTRAVENOUS
  Filled 2018-09-15: qty 5

## 2018-09-15 MED ORDER — SODIUM CHLORIDE 0.9 % IV SOLN
260.0000 mg | Freq: Once | INTRAVENOUS | Status: AC
  Administered 2018-09-15: 260 mg via INTRAVENOUS
  Filled 2018-09-15: qty 26

## 2018-09-15 MED ORDER — SODIUM CHLORIDE 0.9 % IV SOLN
Freq: Once | INTRAVENOUS | Status: AC
  Administered 2018-09-15: 11:00:00 via INTRAVENOUS
  Filled 2018-09-15: qty 5

## 2018-09-15 MED ORDER — SODIUM CHLORIDE 0.9% FLUSH
10.0000 mL | Freq: Once | INTRAVENOUS | Status: AC
  Administered 2018-09-15: 10 mL via INTRAVENOUS
  Filled 2018-09-15: qty 10

## 2018-09-15 MED ORDER — HEPARIN SOD (PORK) LOCK FLUSH 100 UNIT/ML IV SOLN
500.0000 [IU] | Freq: Once | INTRAVENOUS | Status: AC | PRN
  Administered 2018-09-15: 500 [IU]
  Filled 2018-09-15: qty 5

## 2018-09-15 NOTE — Progress Notes (Signed)
Patient is here today to follow up on malignant neoplasm of ovary. Patient stated that she has pain on her neck and low back pain daily. Patient also stated that after she ate three weeks ago she had abdominal pain but has not had that again. Patient denied fever, chills, nausea, vomiting, constipation and diarrhea.

## 2018-09-16 ENCOUNTER — Other Ambulatory Visit: Payer: Self-pay | Admitting: Oncology

## 2018-09-16 ENCOUNTER — Inpatient Hospital Stay: Payer: Medicare Other

## 2018-09-16 DIAGNOSIS — C569 Malignant neoplasm of unspecified ovary: Secondary | ICD-10-CM

## 2018-09-16 LAB — CA 125: Cancer Antigen (CA) 125: 133 U/mL — ABNORMAL HIGH (ref 0.0–38.1)

## 2018-09-16 MED ORDER — PEGFILGRASTIM INJECTION 6 MG/0.6ML ~~LOC~~: 6.0000 mg | PREFILLED_SYRINGE | Freq: Once | SUBCUTANEOUS | Status: DC

## 2018-09-16 MED ORDER — PEGFILGRASTIM INJECTION 6 MG/0.6ML ~~LOC~~
6.0000 mg | PREFILLED_SYRINGE | Freq: Once | SUBCUTANEOUS | Status: AC
  Administered 2018-09-16: 6 mg via SUBCUTANEOUS

## 2018-09-16 NOTE — Progress Notes (Signed)
DISCONTINUE OFF PATHWAY REGIMEN - Ovarian   [id number]:Carboplatin AUC=5 q21 Days:   A cycle is every 21 days:     Carboplatin   **Always confirm dose/schedule in your pharmacy ordering system**  REASON: Disease Progression PRIOR TREATMENT: Off Pathway: Carboplatin AUC=5 q21 Days TREATMENT RESPONSE: Progressive Disease (PD)  START OFF PATHWAY REGIMEN - Ovarian   [id number]:Carboplatin AUC=6 D1 + Paclitaxel 80 mg/m2 Weekly (Dose Dense) q21 Days:   A cycle is every 21 days:     Paclitaxel      Carboplatin   **Always confirm dose/schedule in your pharmacy ordering system**  Patient Characteristics: Recurrent or Progressive Disease, Biochemical Relapse Therapeutic Status: Recurrent or Progressive Disease BRCA Mutation Status: Did Not Order Test Line of Therapy: Biochemical Relapse  Intent of Therapy: Non-Curative / Palliative Intent, Discussed with Patient

## 2018-10-02 NOTE — Progress Notes (Signed)
[place]  [phone] [phone]  ID: Tamara Mckinney OB: [birth year]  MR#: [id number]  [id number]#:[id number]  Patient Care Team: [name], MD as PCP - General (Family Medicine) [name], RN as Registered Nurse Herbert Pun, MD as Consulting Physician (General Surgery)  CHIEF COMPLAINT: Stage IIIC ovarian cancer.  INTERVAL HISTORY: Patient returns to clinic today for further evaluation and consideration of cycle 7 of treatment.  Given her increasing CA-125, patient is now receiving carboplatinum along with weekly Taxol.  She continues to have occasional abdominal discomfort and bloating, but none currently. She has no neurologic complaints. She has a fair appetite and denies weight loss.  She denies any recent fevers or illnesses.  She denies any chest pain or shortness of breath. She has no nausea, vomiting, constipation, or diarrhea. She has no urinary complaints.  Patient offers no further specific complaints today.  REVIEW OF SYSTEMS:   Review of Systems  Constitutional: Negative.  Negative for fever, malaise/fatigue and weight loss.  Respiratory: Negative.  Negative for cough and shortness of breath.   Cardiovascular: Negative.  Negative for chest pain and leg swelling.  Gastrointestinal: Negative.  Negative for abdominal pain, blood in stool, constipation, diarrhea, heartburn, melena, nausea and vomiting.  Genitourinary: Negative.  Negative for dysuria.  Musculoskeletal: Negative.  Negative for back pain and neck pain.  Skin: Negative.  Negative for rash.  Neurological: Negative.  Negative for dizziness, focal weakness and weakness.  Endo/Heme/Allergies: Does not bruise/bleed easily.  Psychiatric/Behavioral: Negative.  The patient is not nervous/anxious.     As per HPI. Otherwise, a complete review of systems is negative.  PAST MEDICAL HISTORY: Past Medical History:  Diagnosis Date  . Anemia   . Arthritis   . Asthma    . Cancer ([place])   . Dyspnea   . GERD (gastroesophageal reflux disease)   . Hypertension     PAST SURGICAL HISTORY: Past Surgical History:  Procedure Laterality Date  . ABDOMINAL HYSTERECTOMY    . BREAST BIOPSY     x6  . BUNIONECTOMY Bilateral   . CATARACT EXTRACTION, BILATERAL    . FLEXIBLE SIGMOIDOSCOPY N/A 05/21/2017   Procedure: FLEXIBLE SIGMOIDOSCOPY;  Surgeon: Lin Landsman, MD;  Location: [facility] ENDOSCOPY;  Service: Gastroenterology;  Laterality: N/A;  . INCONTINENCE SURGERY    . PORTA CATH INSERTION N/A 06/16/2017   Procedure: PORTA CATH INSERTION;  Surgeon: Algernon Huxley, MD;  Location: [place] CV LAB;  Service: Cardiovascular;  Laterality: N/A;  . RECTAL SURGERY  04/2018  . REPAIR OF RECTAL PROLAPSE N/A 05/11/2017   Procedure: REPAIR OF RECTAL PROLAPSE;  Surgeon: Leonie Green, MD;  Location: [facility] ORS;  Service: General;  Laterality: N/A;  . TONSILLECTOMY    . VAGINA SURGERY     uncertain procedure performed    FAMILY HISTORY: Family History  Problem Relation Age of Onset  . Heart disease Mother   . Heart disease Father   . Heart disease Sister   . Heart attack Sister   . Ulcerative colitis Brother   . Lung cancer Brother   . Thyroid cancer Sister   . Asthma Sister   . Diabetes Sister   . Asthma Sister   . Pancreatic cancer Sister   . Dementia Sister   . Asthma Brother   . Heart disease Brother   . Asthma Brother   . Lung cancer Brother   . Asthma Brother   . Lung cancer Brother   . Lung cancer Brother   .  Rheum arthritis Brother     ADVANCED DIRECTIVES (Y/N):  N  HEALTH MAINTENANCE: Social History   Tobacco Use  . Smoking status: Never Smoker  . Smokeless tobacco: Never Used  Substance Use Topics  . Alcohol use: No  . Drug use: No     Colonoscopy:  PAP:  Bone density:  Lipid panel:  No Known Allergies  Current Outpatient Medications  Medication Sig Dispense Refill  . albuterol (PROVENTIL HFA;VENTOLIN HFA) 108 (90 Base)  MCG/ACT inhaler Inhale 1-2 puffs into the lungs every 6 (six) hours as needed for wheezing or shortness of breath.    . cyclobenzaprine (FLEXERIL) 10 MG tablet Take 1 tablet (10 mg total) by mouth 3 (three) times daily as needed for muscle spasms. 30 tablet 1  . esomeprazole (NEXIUM) 40 MG capsule Take 40 mg by mouth daily before breakfast.     . fluticasone furoate-vilanterol (BREO ELLIPTA) 100-25 MCG/INH AEPB Inhale 1 puff into the lungs as needed.     . furosemide (LASIX) 40 MG tablet Take 40 mg by mouth as needed.     Marland Kitchen ipratropium-albuterol (DUONEB) 0.5-2.5 (3) MG/3ML SOLN Take 3 mLs by nebulization every 6 (six) hours as needed. For wheezing/shortness of breath    . lidocaine-prilocaine (EMLA) cream Apply 1 application topically as needed. Apply small amount to port site at least 1 hour prior to it being accessed, cover with plastic wrap 30 g 1  . montelukast (SINGULAIR) 10 MG tablet Take 10 mg by mouth at bedtime.    . valsartan (DIOVAN) 160 MG tablet Start 1/2 tab a day.  Can increase to 1 tab after a few weeks if BP stays high    . polyethylene glycol (MIRALAX / GLYCOLAX) packet Take by mouth.     No current facility-administered medications for this visit.    Facility-Administered Medications Ordered in Other Visits  Medication Dose Route Frequency Provider Last Rate Last Dose  . CARBOplatin (PARAPLATIN) 210 mg in sodium chloride 0.9 % 250 mL chemo infusion  210 mg Intravenous Once Lloyd Huger, MD      . famotidine (PEPCID) IVPB 20 mg premix  20 mg Intravenous Once Lloyd Huger, MD      . heparin lock flush 100 unit/mL  500 Units Intracatheter Once PRN Lloyd Huger, MD      . PACLitaxel (TAXOL) 96 mg in sodium chloride 0.9 % 250 mL chemo infusion (</= 80mg /m2)  72 mg/m2 (Order-Specific) Intravenous Once Lloyd Huger, MD      . palonosetron (ALOXI) injection 0.25 mg  0.25 mg Intravenous Once Lloyd Huger, MD      . sodium chloride flush (NS) 0.9 %  injection 10 mL  10 mL Intravenous PRN Lloyd Huger, MD   10 mL at 03/10/18 1200  . sodium chloride flush (NS) 0.9 % injection 10 mL  10 mL Intracatheter PRN Lloyd Huger, MD        OBJECTIVE: Vitals:   10/06/18 0946  BP: (!) 144/77  Pulse: (!) 101  Temp: (!) 97 F (36.1 C)     Body mass index is 21.36 kg/m.    ECOG FS:0 - Asymptomatic  General: Well-developed, well-nourished, no acute distress. Eyes: Pink conjunctiva, anicteric sclera. HEENT: Normocephalic, moist mucous membranes. Lungs: Clear to auscultation bilaterally. Heart: Regular rate and rhythm. No rubs, murmurs, or gallops. Abdomen: Soft, nontender, nondistended. No organomegaly noted, normoactive bowel sounds. Musculoskeletal: No edema, cyanosis, or clubbing. Neuro: Alert, answering all questions appropriately. Cranial nerves  grossly intact. Skin: No rashes or petechiae noted. Psych: Normal affect.  LAB RESULTS:  Lab Results  Component Value Date   NA 131 (L) 10/05/2018   K 4.3 10/05/2018   CL 99 10/05/2018   CO2 23 10/05/2018   GLUCOSE 142 (H) 10/05/2018   BUN 27 (H) 10/05/2018   CREATININE 1.01 (H) 10/05/2018   CALCIUM 8.5 (L) 10/05/2018   PROT 6.9 10/05/2018   ALBUMIN 3.8 10/05/2018   AST 25 10/05/2018   ALT 14 10/05/2018   ALKPHOS 108 10/05/2018   BILITOT 0.3 10/05/2018   GFRNONAA 49 (L) 10/05/2018   GFRAA 57 (L) 10/05/2018    Lab Results  Component Value Date   WBC 6.1 10/05/2018   NEUTROABS 4.6 10/05/2018   HGB 9.0 (L) 10/05/2018   HCT 28.1 (L) 10/05/2018   MCV 104.1 (H) 10/05/2018   PLT 321 10/05/2018     STUDIES: No results found.  ASSESSMENT: Stage IIIC ovarian cancer.  PLAN:    1. Stage IIIC ovarian cancer: Patient now has recurrent disease.  CT scan results from May 06, 2018 reviewed independently with multifocal peritoneal metastasis increasing in size and number.  Patient's CA-125 continues to trend up and is now 126.0.  Given likely progressive disease, will  proceed with dose reduced carboplatin and Taxol today. Continue to monitor closely given her advanced age and potential increased toxicity.  Return to clinic in 1 week for further evaluation and consideration of cycle 1, day 8 which is Taxol only.  Patient also may continue to require Neulasta in the future. 2. Lupus anticoagulant: Patient was noted to have an elevated PTT as well as increased bleeding during her surgery for rectal prolapse. Continue to monitor closely and repeat lupus anticoagulant panel if patient has evidence of bleeding or requires additional procedures.   3. Anemia: Patient's hemoglobin is decreased, but relatively stable at 9.0.  Iron stores are within normal limits.  She does not require transfusion at this time, but will consider one in the future if necessary. 4.  Nausea: Patient does not complain of this today.   5.  Leukopenia: Resolved.  Consider addition of Neulasta as above.   Patient expressed understanding and was in agreement with this plan. She also understands that She can call clinic at any time with any questions, concerns, or complaints.   Cancer Staging Malignant neoplasm of ovary [facility]) Staging form: Ovary, Fallopian Tube, and Primary Peritoneal Carcinoma, AJCC 8th Edition - Clinical stage from 05/29/2017: Stage IIIC (cT3c, cN1b, cM0) - Signed by Lloyd Huger, MD on 05/29/2017   Lloyd Huger, MD   10/06/2018 11:12 AM

## 2018-10-05 ENCOUNTER — Other Ambulatory Visit: Payer: Self-pay | Admitting: Oncology

## 2018-10-05 ENCOUNTER — Inpatient Hospital Stay: Payer: Medicare Other | Attending: Oncology

## 2018-10-05 DIAGNOSIS — K219 Gastro-esophageal reflux disease without esophagitis: Secondary | ICD-10-CM | POA: Insufficient documentation

## 2018-10-05 DIAGNOSIS — D649 Anemia, unspecified: Secondary | ICD-10-CM | POA: Insufficient documentation

## 2018-10-05 DIAGNOSIS — R109 Unspecified abdominal pain: Secondary | ICD-10-CM | POA: Insufficient documentation

## 2018-10-05 DIAGNOSIS — Z5111 Encounter for antineoplastic chemotherapy: Secondary | ICD-10-CM | POA: Diagnosis not present

## 2018-10-05 DIAGNOSIS — C786 Secondary malignant neoplasm of retroperitoneum and peritoneum: Secondary | ICD-10-CM | POA: Diagnosis not present

## 2018-10-05 DIAGNOSIS — I10 Essential (primary) hypertension: Secondary | ICD-10-CM | POA: Insufficient documentation

## 2018-10-05 DIAGNOSIS — M1990 Unspecified osteoarthritis, unspecified site: Secondary | ICD-10-CM | POA: Insufficient documentation

## 2018-10-05 DIAGNOSIS — C569 Malignant neoplasm of unspecified ovary: Secondary | ICD-10-CM | POA: Insufficient documentation

## 2018-10-05 DIAGNOSIS — Z7689 Persons encountering health services in other specified circumstances: Secondary | ICD-10-CM | POA: Insufficient documentation

## 2018-10-05 DIAGNOSIS — R188 Other ascites: Secondary | ICD-10-CM | POA: Diagnosis not present

## 2018-10-05 DIAGNOSIS — Z79899 Other long term (current) drug therapy: Secondary | ICD-10-CM | POA: Diagnosis not present

## 2018-10-05 LAB — CBC WITH DIFFERENTIAL/PLATELET
Abs Immature Granulocytes: 0.1 10*3/uL — ABNORMAL HIGH (ref 0.00–0.07)
Basophils Absolute: 0 10*3/uL (ref 0.0–0.1)
Basophils Relative: 0 %
Eosinophils Absolute: 0 10*3/uL (ref 0.0–0.5)
Eosinophils Relative: 0 %
HCT: 28.1 % — ABNORMAL LOW (ref 36.0–46.0)
Hemoglobin: 9 g/dL — ABNORMAL LOW (ref 12.0–15.0)
Immature Granulocytes: 2 %
Lymphocytes Relative: 17 %
Lymphs Abs: 1 10*3/uL (ref 0.7–4.0)
MCH: 33.3 pg (ref 26.0–34.0)
MCHC: 32 g/dL (ref 30.0–36.0)
MCV: 104.1 fL — ABNORMAL HIGH (ref 80.0–100.0)
Monocytes Absolute: 0.3 10*3/uL (ref 0.1–1.0)
Monocytes Relative: 5 %
Neutro Abs: 4.6 10*3/uL (ref 1.7–7.7)
Neutrophils Relative %: 76 %
Platelets: 321 10*3/uL (ref 150–400)
RBC: 2.7 MIL/uL — ABNORMAL LOW (ref 3.87–5.11)
RDW: 14.3 % (ref 11.5–15.5)
WBC: 6.1 10*3/uL (ref 4.0–10.5)
nRBC: 0 % (ref 0.0–0.2)

## 2018-10-05 LAB — COMPREHENSIVE METABOLIC PANEL WITH GFR
ALT: 14 U/L (ref 0–44)
AST: 25 U/L (ref 15–41)
Albumin: 3.8 g/dL (ref 3.5–5.0)
Alkaline Phosphatase: 108 U/L (ref 38–126)
Anion gap: 9 (ref 5–15)
BUN: 27 mg/dL — ABNORMAL HIGH (ref 8–23)
CO2: 23 mmol/L (ref 22–32)
Calcium: 8.5 mg/dL — ABNORMAL LOW (ref 8.9–10.3)
Chloride: 99 mmol/L (ref 98–111)
Creatinine, Ser: 1.01 mg/dL — ABNORMAL HIGH (ref 0.44–1.00)
GFR calc Af Amer: 57 mL/min — ABNORMAL LOW
GFR calc non Af Amer: 49 mL/min — ABNORMAL LOW
Glucose, Bld: 142 mg/dL — ABNORMAL HIGH (ref 70–99)
Potassium: 4.3 mmol/L (ref 3.5–5.1)
Sodium: 131 mmol/L — ABNORMAL LOW (ref 135–145)
Total Bilirubin: 0.3 mg/dL (ref 0.3–1.2)
Total Protein: 6.9 g/dL (ref 6.5–8.1)

## 2018-10-06 ENCOUNTER — Ambulatory Visit: Payer: Medicare Other

## 2018-10-06 ENCOUNTER — Inpatient Hospital Stay (HOSPITAL_BASED_OUTPATIENT_CLINIC_OR_DEPARTMENT_OTHER): Payer: Medicare Other | Admitting: Oncology

## 2018-10-06 ENCOUNTER — Other Ambulatory Visit: Payer: Self-pay

## 2018-10-06 ENCOUNTER — Inpatient Hospital Stay: Payer: Medicare Other

## 2018-10-06 VITALS — BP 144/77 | HR 101 | Temp 97.0°F | Wt 98.7 lb

## 2018-10-06 VITALS — BP 148/86 | HR 80 | Resp 18

## 2018-10-06 DIAGNOSIS — R188 Other ascites: Secondary | ICD-10-CM

## 2018-10-06 DIAGNOSIS — Z79899 Other long term (current) drug therapy: Secondary | ICD-10-CM

## 2018-10-06 DIAGNOSIS — M1990 Unspecified osteoarthritis, unspecified site: Secondary | ICD-10-CM

## 2018-10-06 DIAGNOSIS — D649 Anemia, unspecified: Secondary | ICD-10-CM | POA: Diagnosis not present

## 2018-10-06 DIAGNOSIS — R109 Unspecified abdominal pain: Secondary | ICD-10-CM | POA: Diagnosis not present

## 2018-10-06 DIAGNOSIS — C569 Malignant neoplasm of unspecified ovary: Secondary | ICD-10-CM

## 2018-10-06 DIAGNOSIS — K219 Gastro-esophageal reflux disease without esophagitis: Secondary | ICD-10-CM

## 2018-10-06 DIAGNOSIS — I10 Essential (primary) hypertension: Secondary | ICD-10-CM

## 2018-10-06 LAB — CA 125: Cancer Antigen (CA) 125: 126 U/mL — ABNORMAL HIGH (ref 0.0–38.1)

## 2018-10-06 MED ORDER — SODIUM CHLORIDE 0.9 % IV SOLN
208.4000 mg | Freq: Once | INTRAVENOUS | Status: AC
  Administered 2018-10-06: 210 mg via INTRAVENOUS
  Filled 2018-10-06: qty 21

## 2018-10-06 MED ORDER — FAMOTIDINE IN NACL 20-0.9 MG/50ML-% IV SOLN
20.0000 mg | Freq: Once | INTRAVENOUS | Status: AC
  Administered 2018-10-06: 20 mg via INTRAVENOUS
  Filled 2018-10-06: qty 50

## 2018-10-06 MED ORDER — DEXAMETHASONE SODIUM PHOSPHATE 10 MG/ML IJ SOLN
10.0000 mg | Freq: Once | INTRAMUSCULAR | Status: AC
  Administered 2018-10-06: 10 mg via INTRAVENOUS
  Filled 2018-10-06: qty 1

## 2018-10-06 MED ORDER — PALONOSETRON HCL INJECTION 0.25 MG/5ML
0.2500 mg | Freq: Once | INTRAVENOUS | Status: AC
  Administered 2018-10-06: 0.25 mg via INTRAVENOUS
  Filled 2018-10-06: qty 5

## 2018-10-06 MED ORDER — DIPHENHYDRAMINE HCL 50 MG/ML IJ SOLN
25.0000 mg | Freq: Once | INTRAMUSCULAR | Status: AC
  Administered 2018-10-06: 25 mg via INTRAVENOUS
  Filled 2018-10-06: qty 1

## 2018-10-06 MED ORDER — HEPARIN SOD (PORK) LOCK FLUSH 100 UNIT/ML IV SOLN
500.0000 [IU] | Freq: Once | INTRAVENOUS | Status: AC | PRN
  Administered 2018-10-06: 500 [IU]
  Filled 2018-10-06: qty 5

## 2018-10-06 MED ORDER — SODIUM CHLORIDE 0.9 % IV SOLN
Freq: Once | INTRAVENOUS | Status: AC
  Administered 2018-10-06: 11:00:00 via INTRAVENOUS
  Filled 2018-10-06: qty 250

## 2018-10-06 MED ORDER — SODIUM CHLORIDE 0.9 % IV SOLN
72.0000 mg/m2 | Freq: Once | INTRAVENOUS | Status: AC
  Administered 2018-10-06: 96 mg via INTRAVENOUS
  Filled 2018-10-06: qty 16

## 2018-10-06 MED ORDER — SODIUM CHLORIDE 0.9% FLUSH
10.0000 mL | INTRAVENOUS | Status: DC | PRN
  Filled 2018-10-06: qty 10

## 2018-10-06 NOTE — Progress Notes (Signed)
HR recheck result is 84 bpm. RN Rosa to put in chart.  Larene Beach, PharmD

## 2018-10-06 NOTE — Progress Notes (Signed)
Patient is here today to follow up on her ovarian cancer. Patient stated that she had been doing well. Patient stated that she has some pain on her right shoulder and neck. Patient is currently taking Flexeril and helps with the soreness and stiffness.

## 2018-10-07 ENCOUNTER — Inpatient Hospital Stay: Payer: Medicare Other

## 2018-10-07 ENCOUNTER — Other Ambulatory Visit: Payer: Self-pay | Admitting: *Deleted

## 2018-10-07 DIAGNOSIS — C569 Malignant neoplasm of unspecified ovary: Secondary | ICD-10-CM | POA: Diagnosis not present

## 2018-10-07 MED ORDER — PEGFILGRASTIM INJECTION 6 MG/0.6ML ~~LOC~~
6.0000 mg | PREFILLED_SYRINGE | Freq: Once | SUBCUTANEOUS | Status: AC
  Administered 2018-10-07: 6 mg via SUBCUTANEOUS

## 2018-10-09 NOTE — Progress Notes (Signed)
[place]  [phone]) [phone] [phone]) [phone]  ID: Tamara Mckinney OB: 1928/03/17  MR#: [id number]  AOZ#:[id number]  Patient Care Team: [name], MD as PCP - General (Family Medicine) [name], RN as Registered Nurse Herbert Pun, MD as Consulting Physician (General Surgery)  CHIEF COMPLAINT: Stage IIIC ovarian cancer.  INTERVAL HISTORY: Patient returns to clinic today for further evaluation and consideration of cycle 1, day 8 of carboplatin and Taxol.  Taxol only today.  Patient noted some increased nausea for several days after treatment, but otherwise tolerated it well.  She continues to have occasional abdominal discomfort and bloating, but none currently. She has no neurologic complaints. She has a fair appetite and denies weight loss.  She denies any recent fevers or illnesses.  She denies any chest pain or shortness of breath. She has no nausea, vomiting, constipation, or diarrhea. She has no urinary complaints.  Patient offers no further specific complaints today.  REVIEW OF SYSTEMS:   Review of Systems  Constitutional: Negative.  Negative for fever, malaise/fatigue and weight loss.  Respiratory: Negative.  Negative for cough and shortness of breath.   Cardiovascular: Negative.  Negative for chest pain and leg swelling.  Gastrointestinal: Positive for nausea. Negative for abdominal pain, blood in stool, constipation, diarrhea, heartburn, melena and vomiting.  Genitourinary: Negative.  Negative for dysuria.  Musculoskeletal: Negative.  Negative for back pain and neck pain.  Skin: Negative.  Negative for rash.  Neurological: Negative.  Negative for dizziness, focal weakness and weakness.  Endo/Heme/Allergies: Does not bruise/bleed easily.  Psychiatric/Behavioral: Negative.  The patient is not nervous/anxious.     As per HPI. Otherwise, a complete review of systems is negative.  PAST MEDICAL HISTORY: Past Medical History:    Diagnosis Date  . Anemia   . Arthritis   . Asthma   . Cancer ([place])   . Dyspnea   . GERD (gastroesophageal reflux disease)   . Hypertension     PAST SURGICAL HISTORY: Past Surgical History:  Procedure Laterality Date  . ABDOMINAL HYSTERECTOMY    . BREAST BIOPSY     x6  . BUNIONECTOMY Bilateral   . CATARACT EXTRACTION, BILATERAL    . FLEXIBLE SIGMOIDOSCOPY N/A 05/21/2017   Procedure: FLEXIBLE SIGMOIDOSCOPY;  Surgeon: Lin Landsman, MD;  Location: [facility] ENDOSCOPY;  Service: Gastroenterology;  Laterality: N/A;  . INCONTINENCE SURGERY    . PORTA CATH INSERTION N/A 06/16/2017   Procedure: PORTA CATH INSERTION;  Surgeon: Algernon Huxley, MD;  Location: [place] [facility] LAB;  Service: Cardiovascular;  Laterality: N/A;  . RECTAL SURGERY  04/2018  . REPAIR OF RECTAL PROLAPSE N/A 05/11/2017   Procedure: REPAIR OF RECTAL PROLAPSE;  Surgeon: Leonie Green, MD;  Location: [facility] ORS;  Service: General;  Laterality: N/A;  . TONSILLECTOMY    . VAGINA SURGERY     uncertain procedure performed    FAMILY HISTORY: Family History  Problem Relation Age of Onset  . Heart disease Mother   . Heart disease Father   . Heart disease Sister   . Heart attack Sister   . Ulcerative colitis Brother   . Lung cancer Brother   . Thyroid cancer Sister   . Asthma Sister   . Diabetes Sister   . Asthma Sister   . Pancreatic cancer Sister   . Dementia Sister   . Asthma Brother   . Heart disease Brother   . Asthma Brother   . Lung cancer Brother   . Asthma Brother   .  Lung cancer Brother   . Lung cancer Brother   . Rheum arthritis Brother     ADVANCED DIRECTIVES (Y/N):  N  HEALTH MAINTENANCE: Social History   Tobacco Use  . Smoking status: Never Smoker  . Smokeless tobacco: Never Used  Substance Use Topics  . Alcohol use: No  . Drug use: No     Colonoscopy:  PAP:  Bone density:  Lipid panel:  No Known Allergies  Current Outpatient Medications  Medication Sig Dispense Refill   . albuterol (PROVENTIL HFA;VENTOLIN HFA) 108 (90 Base) MCG/ACT inhaler Inhale 1-2 puffs into the lungs every 6 (six) hours as needed for wheezing or shortness of breath.    . cyclobenzaprine (FLEXERIL) 10 MG tablet Take 1 tablet (10 mg total) by mouth 3 (three) times daily as needed for muscle spasms. 30 tablet 1  . esomeprazole (NEXIUM) 40 MG capsule Take 40 mg by mouth daily before breakfast.     . fluticasone furoate-vilanterol (BREO ELLIPTA) 100-25 MCG/INH AEPB Inhale 1 puff into the lungs as needed.     . furosemide (LASIX) 40 MG tablet Take 40 mg by mouth as needed.     Marland Kitchen ipratropium-albuterol (DUONEB) 0.5-2.5 (3) MG/3ML SOLN Take 3 mLs by nebulization every 6 (six) hours as needed. For wheezing/shortness of breath    . lidocaine-prilocaine (EMLA) cream Apply 1 application topically as needed. Apply small amount to port site at least 1 hour prior to it being accessed, cover with plastic wrap 30 g 1  . montelukast (SINGULAIR) 10 MG tablet Take 10 mg by mouth at bedtime.    . polyethylene glycol (MIRALAX / GLYCOLAX) packet Take by mouth.    . valsartan (DIOVAN) 160 MG tablet Start 1/2 tab a day.  Can increase to 1 tab after a few weeks if BP stays high     No current facility-administered medications for this visit.    Facility-Administered Medications Ordered in Other Visits  Medication Dose Route Frequency Provider Last Rate Last Dose  . sodium chloride flush (NS) 0.9 % injection 10 mL  10 mL Intravenous PRN Lloyd Huger, MD   10 mL at 03/10/18 1200    OBJECTIVE: Vitals:   10/13/18 0901  BP: 134/83  Pulse: (!) 108  Temp: (!) 97 F (36.1 C)     Body mass index is 21.4 kg/m.    ECOG FS:0 - Asymptomatic  General: Well-developed, well-nourished, no acute distress. Eyes: Pink conjunctiva, anicteric sclera. HEENT: Normocephalic, moist mucous membranes. Lungs: Clear to auscultation bilaterally. Heart: Regular rate and rhythm. No rubs, murmurs, or gallops. Abdomen: Soft,  nontender, nondistended. No organomegaly noted, normoactive bowel sounds. Musculoskeletal: No edema, cyanosis, or clubbing. Neuro: Alert, answering all questions appropriately. Cranial nerves grossly intact. Skin: No rashes or petechiae noted. Psych: Normal affect.  LAB RESULTS:  Lab Results  Component Value Date   NA 133 (L) 10/13/2018   K 4.1 10/13/2018   CL 101 10/13/2018   CO2 24 10/13/2018   GLUCOSE 109 (H) 10/13/2018   BUN 21 10/13/2018   CREATININE 0.89 10/13/2018   CALCIUM 8.8 (L) 10/13/2018   PROT 6.9 10/13/2018   ALBUMIN 3.9 10/13/2018   AST 32 10/13/2018   ALT 17 10/13/2018   ALKPHOS 251 (H) 10/13/2018   BILITOT 0.4 10/13/2018   GFRNONAA 57 (L) 10/13/2018   GFRAA >60 10/13/2018    Lab Results  Component Value Date   WBC 41.6 (H) 10/13/2018   NEUTROABS 37.0 (H) 10/13/2018   HGB 9.5 (L) 10/13/2018  HCT 29.2 (L) 10/13/2018   MCV 102.8 (H) 10/13/2018   PLT 351 10/13/2018     STUDIES: No results found.  ASSESSMENT: Stage IIIC ovarian cancer.  PLAN:    1. Stage IIIC ovarian cancer: Patient now has recurrent disease.  CT scan results from May 06, 2018 reviewed independently with multifocal peritoneal metastasis increasing in size and number.  Patient's CA-125 has trended down slightly to 122.  Patient received cycle 1, day 1 of carboplatinum and Taxol last week.  Unfortunately, she received Neulasta 1 day after treatment therefore cannot receive cycle 1, day 8 of Taxol today.  Return to clinic in 1 week for further evaluation and reconsideration of cycle 1, day 8.   2. Lupus anticoagulant: Patient was noted to have an elevated PTT as well as increased bleeding during her surgery for rectal prolapse. Continue to monitor closely and repeat lupus anticoagulant panel if patient has evidence of bleeding or requires additional procedures.   3. Anemia: Patient's hemoglobin is mildly improved at 9.5.  Iron stores are within normal limits.  She does not require  transfusion at this time, but will consider one in the future if necessary. 4.  Nausea: Continue antiemetics as prescribed. 5.  Leukocytosis: Secondary to Neulasta.  Patient expressed understanding and was in agreement with this plan. She also understands that She can call clinic at any time with any questions, concerns, or complaints.   Cancer Staging Malignant neoplasm of ovary [facility]) Staging form: Ovary, Fallopian Tube, and Primary Peritoneal Carcinoma, AJCC 8th Edition - Clinical stage from 05/29/2017: Stage IIIC (cT3c, cN1b, cM0) - Signed by Lloyd Huger, MD on 05/29/2017   Lloyd Huger, MD   10/14/2018 9:59 PM

## 2018-10-13 ENCOUNTER — Inpatient Hospital Stay (HOSPITAL_BASED_OUTPATIENT_CLINIC_OR_DEPARTMENT_OTHER): Payer: Medicare Other | Admitting: Oncology

## 2018-10-13 ENCOUNTER — Other Ambulatory Visit: Payer: Self-pay

## 2018-10-13 ENCOUNTER — Inpatient Hospital Stay: Payer: Medicare Other

## 2018-10-13 VITALS — BP 134/83 | HR 108 | Temp 97.0°F | Wt 98.9 lb

## 2018-10-13 DIAGNOSIS — C569 Malignant neoplasm of unspecified ovary: Secondary | ICD-10-CM

## 2018-10-13 DIAGNOSIS — R109 Unspecified abdominal pain: Secondary | ICD-10-CM

## 2018-10-13 DIAGNOSIS — C786 Secondary malignant neoplasm of retroperitoneum and peritoneum: Secondary | ICD-10-CM

## 2018-10-13 DIAGNOSIS — D649 Anemia, unspecified: Secondary | ICD-10-CM | POA: Diagnosis not present

## 2018-10-13 DIAGNOSIS — K219 Gastro-esophageal reflux disease without esophagitis: Secondary | ICD-10-CM

## 2018-10-13 DIAGNOSIS — M1990 Unspecified osteoarthritis, unspecified site: Secondary | ICD-10-CM

## 2018-10-13 DIAGNOSIS — R188 Other ascites: Secondary | ICD-10-CM

## 2018-10-13 DIAGNOSIS — I10 Essential (primary) hypertension: Secondary | ICD-10-CM

## 2018-10-13 DIAGNOSIS — Z79899 Other long term (current) drug therapy: Secondary | ICD-10-CM

## 2018-10-13 LAB — COMPREHENSIVE METABOLIC PANEL
ALT: 17 U/L (ref 0–44)
AST: 32 U/L (ref 15–41)
Albumin: 3.9 g/dL (ref 3.5–5.0)
Alkaline Phosphatase: 251 U/L — ABNORMAL HIGH (ref 38–126)
Anion gap: 8 (ref 5–15)
BUN: 21 mg/dL (ref 8–23)
CO2: 24 mmol/L (ref 22–32)
Calcium: 8.8 mg/dL — ABNORMAL LOW (ref 8.9–10.3)
Chloride: 101 mmol/L (ref 98–111)
Creatinine, Ser: 0.89 mg/dL (ref 0.44–1.00)
GFR calc Af Amer: >60 mL/min (ref >60)
GFR calc non Af Amer: 57 mL/min — ABNORMAL LOW (ref >60)
Glucose, Bld: 109 mg/dL — ABNORMAL HIGH (ref 70–99)
Potassium: 4.1 mmol/L (ref 3.5–5.1)
Sodium: 133 mmol/L — ABNORMAL LOW (ref 135–145)
Total Bilirubin: 0.4 mg/dL (ref 0.3–1.2)
Total Protein: 6.9 g/dL (ref 6.5–8.1)

## 2018-10-13 LAB — CBC WITH DIFFERENTIAL/PLATELET
Abs Immature Granulocytes: 0.8 10*3/uL — ABNORMAL HIGH (ref 0.00–0.07)
Band Neutrophils: 23 %
Basophils Absolute: 0 10*3/uL (ref 0.0–0.1)
Basophils Relative: 0 %
Eosinophils Absolute: 0 10*3/uL (ref 0.0–0.5)
Eosinophils Relative: 0 %
HCT: 29.2 % — ABNORMAL LOW (ref 36.0–46.0)
Hemoglobin: 9.5 g/dL — ABNORMAL LOW (ref 12.0–15.0)
Lymphocytes Relative: 5 %
Lymphs Abs: 2.1 10*3/uL (ref 0.7–4.0)
MCH: 33.5 pg (ref 26.0–34.0)
MCHC: 32.5 g/dL (ref 30.0–36.0)
MCV: 102.8 fL — ABNORMAL HIGH (ref 80.0–100.0)
Metamyelocytes Relative: 1 %
Monocytes Absolute: 1.7 10*3/uL — ABNORMAL HIGH (ref 0.1–1.0)
Monocytes Relative: 4 %
Myelocytes: 1 %
Neutro Abs: 37 10*3/uL — ABNORMAL HIGH (ref 1.7–7.7)
Neutrophils Relative %: 66 %
Platelets: 351 10*3/uL (ref 150–400)
RBC: 2.84 MIL/uL — ABNORMAL LOW (ref 3.87–5.11)
RDW: 14.9 % (ref 11.5–15.5)
WBC Morphology: INCREASED
WBC: 41.6 10*3/uL — ABNORMAL HIGH (ref 4.0–10.5)
nRBC: 0 % (ref 0.0–0.2)

## 2018-10-13 MED ORDER — HEPARIN SOD (PORK) LOCK FLUSH 100 UNIT/ML IV SOLN
500.0000 [IU] | Freq: Once | INTRAVENOUS | Status: AC
  Administered 2018-10-13: 500 [IU] via INTRAVENOUS
  Filled 2018-10-13: qty 5

## 2018-10-13 MED ORDER — SODIUM CHLORIDE 0.9% FLUSH
10.0000 mL | Freq: Once | INTRAVENOUS | Status: DC
  Filled 2018-10-13: qty 10

## 2018-10-13 NOTE — Progress Notes (Signed)
Patient is here today to follow up on her malignant neoplasm of ovary. Patient stated that she had nausea and dizziness. Patient continues to be dizzy. Patient also stated that she would want a prescription for her nausea medication.

## 2018-10-14 LAB — CA 125: Cancer Antigen (CA) 125: 122 U/mL — ABNORMAL HIGH (ref 0.0–38.1)

## 2018-10-17 NOTE — Progress Notes (Signed)
[place]  Telephone:(336) [phone] [phone]) [phone]  ID: Tamara Mckinney OB: [birth year]  MR#: [id number]  SWN#:[id number]  Patient Care Team: [name], MD as PCP - General (Family Medicine) [name], RN as Registered Nurse Herbert Pun, MD as Consulting Physician (General Surgery)  CHIEF COMPLAINT: Stage IIIC ovarian cancer.  INTERVAL HISTORY: Patient returns to clinic today for further evaluation and reconsideration of cycle 1, day 8 of carboplatinum and Taxol.  Taxol only today.  She currently feels well and is asymptomatic. She has no neurologic complaints. She has a fair appetite and denies weight loss.  She denies any recent fevers or illnesses.  She denies any chest pain or shortness of breath. She has no nausea, vomiting, constipation, or diarrhea. She has no urinary complaints.  Patient feels at her baseline offers no specific complaints today.  REVIEW OF SYSTEMS:   Review of Systems  Constitutional: Negative.  Negative for fever, malaise/fatigue and weight loss.  Respiratory: Negative.  Negative for cough and shortness of breath.   Cardiovascular: Negative.  Negative for chest pain and leg swelling.  Gastrointestinal: Negative.  Negative for abdominal pain, blood in stool, constipation, diarrhea, heartburn, melena, nausea and vomiting.  Genitourinary: Negative.  Negative for dysuria.  Musculoskeletal: Negative.  Negative for back pain and neck pain.  Skin: Negative.  Negative for rash.  Neurological: Negative.  Negative for dizziness, focal weakness and weakness.  Endo/Heme/Allergies: Does not bruise/bleed easily.  Psychiatric/Behavioral: Negative.  The patient is not nervous/anxious.     As per HPI. Otherwise, a complete review of systems is negative.  PAST MEDICAL HISTORY: Past Medical History:  Diagnosis Date  . Anemia   . Arthritis   . Asthma   . Cancer ([place])   . Dyspnea   . GERD (gastroesophageal reflux disease)   .  Hypertension     PAST SURGICAL HISTORY: Past Surgical History:  Procedure Laterality Date  . ABDOMINAL HYSTERECTOMY    . BREAST BIOPSY     x6  . BUNIONECTOMY Bilateral   . CATARACT EXTRACTION, BILATERAL    . FLEXIBLE SIGMOIDOSCOPY N/A 05/21/2017   Procedure: FLEXIBLE SIGMOIDOSCOPY;  Surgeon: Lin Landsman, MD;  Location[facility] ENDOSCOPY;  Service: Gastroenterology;  Laterality: N/A;  . INCONTINENCE SURGERY    . PORTA CATH INSERTION N/A 06/16/2017   Procedure: PORTA CATH INSERTION;  Surgeon: Algernon Huxley, MD;  Location: [place] [facility] LAB;  Service: Cardiovascular;  Laterality: N/A;  . RECTAL SURGERY  04/2018  . REPAIR OF RECTAL PROLAPSE N/A 05/11/2017   Procedure: REPAIR OF RECTAL PROLAPSE;  Surgeon: Leonie Green, MD;  Location: [facility] ORS;  Service: General;  Laterality: N/A;  . TONSILLECTOMY    . VAGINA SURGERY     uncertain procedure performed    FAMILY HISTORY: Family History  Problem Relation Age of Onset  . Heart disease Mother   . Heart disease Father   . Heart disease Sister   . Heart attack Sister   . Ulcerative colitis Brother   . Lung cancer Brother   . Thyroid cancer Sister   . Asthma Sister   . Diabetes Sister   . Asthma Sister   . Pancreatic cancer Sister   . Dementia Sister   . Asthma Brother   . Heart disease Brother   . Asthma Brother   . Lung cancer Brother   . Asthma Brother   . Lung cancer Brother   . Lung cancer Brother   . Rheum arthritis Brother  ADVANCED DIRECTIVES (Y/N):  N  HEALTH MAINTENANCE: Social History   Tobacco Use  . Smoking status: Never Smoker  . Smokeless tobacco: Never Used  Substance Use Topics  . Alcohol use: No  . Drug use: No     Colonoscopy:  PAP:  Bone density:  Lipid panel:  No Known Allergies  Current Outpatient Medications  Medication Sig Dispense Refill  . albuterol (PROVENTIL HFA;VENTOLIN HFA) 108 (90 Base) MCG/ACT inhaler Inhale 1-2 puffs into the lungs every 6 (six) hours as needed  for wheezing or shortness of breath.    . cyclobenzaprine (FLEXERIL) 10 MG tablet Take 1 tablet (10 mg total) by mouth 3 (three) times daily as needed for muscle spasms. 30 tablet 1  . esomeprazole (NEXIUM) 40 MG capsule Take 40 mg by mouth daily before breakfast.     . fluticasone furoate-vilanterol (BREO ELLIPTA) 100-25 MCG/INH AEPB Inhale 1 puff into the lungs as needed.     . furosemide (LASIX) 40 MG tablet Take 40 mg by mouth as needed.     Marland Kitchen ipratropium-albuterol (DUONEB) 0.5-2.5 (3) MG/3ML SOLN Take 3 mLs by nebulization every 6 (six) hours as needed. For wheezing/shortness of breath    . lidocaine-prilocaine (EMLA) cream Apply 1 application topically as needed. Apply small amount to port site at least 1 hour prior to it being accessed, cover with plastic wrap 30 g 1  . montelukast (SINGULAIR) 10 MG tablet Take 10 mg by mouth at bedtime.    . polyethylene glycol (MIRALAX / GLYCOLAX) packet Take by mouth.    . valsartan (DIOVAN) 160 MG tablet Start 1/2 tab a day.  Can increase to 1 tab after a few weeks if BP stays high    . ondansetron (ZOFRAN) 8 MG tablet Take 1 tablet (8 mg total) by mouth 2 (two) times daily as needed for nausea or vomiting. 30 tablet 2   No current facility-administered medications for this visit.    Facility-Administered Medications Ordered in Other Visits  Medication Dose Route Frequency Provider Last Rate Last Dose  . sodium chloride flush (NS) 0.9 % injection 10 mL  10 mL Intravenous PRN Lloyd Huger, MD   10 mL at 03/10/18 1200    OBJECTIVE: Vitals:   10/20/18 1007  BP: (!) 144/85  Pulse: 99  Temp: (!) 97.5 F (36.4 C)     Body mass index is 21.47 kg/m.    ECOG FS:0 - Asymptomatic  General: Well-developed, well-nourished, no acute distress. Eyes: Pink conjunctiva, anicteric sclera. HEENT: Normocephalic, moist mucous membranes. Lungs: Clear to auscultation bilaterally. Heart: Regular rate and rhythm. No rubs, murmurs, or gallops. Abdomen: Soft,  nontender, nondistended. No organomegaly noted, normoactive bowel sounds. Musculoskeletal: No edema, cyanosis, or clubbing. Neuro: Alert, answering all questions appropriately. Cranial nerves grossly intact. Skin: No rashes or petechiae noted. Psych: Normal affect.  LAB RESULTS:  Lab Results  Component Value Date   NA 131 (L) 10/20/2018   K 4.6 10/20/2018   CL 101 10/20/2018   CO2 24 10/20/2018   GLUCOSE 97 10/20/2018   BUN 26 (H) 10/20/2018   CREATININE 0.83 10/20/2018   CALCIUM 8.6 (L) 10/20/2018   PROT 6.9 10/20/2018   ALBUMIN 3.9 10/20/2018   AST 23 10/20/2018   ALT 14 10/20/2018   ALKPHOS 160 (H) 10/20/2018   BILITOT 0.4 10/20/2018   GFRNONAA >60 10/20/2018   GFRAA >60 10/20/2018    Lab Results  Component Value Date   WBC 16.5 (H) 10/20/2018   NEUTROABS 13.8 (H)  10/20/2018   HGB 9.2 (L) 10/20/2018   HCT 28.5 (L) 10/20/2018   MCV 102.5 (H) 10/20/2018   PLT 193 10/20/2018     STUDIES: No results found.  ASSESSMENT: Stage IIIC ovarian cancer.  PLAN:    1. Stage IIIC ovarian cancer: Patient now has recurrent disease.  CT scan results from May 06, 2018 reviewed independently with multifocal peritoneal metastasis increasing in size and number.  Patient's CA-125 continues to trend down is now 78.1.  Proceed with cycle 1, day 8 of Taxol only today.  Return to clinic in 1 week for further evaluation and consideration of cycle 1, day 15.   2. Lupus anticoagulant: Patient was noted to have an elevated PTT as well as increased bleeding during her surgery for rectal prolapse. Continue to monitor closely and repeat lupus anticoagulant panel if patient has evidence of bleeding or requires additional procedures.   3. Anemia: Patient's hemoglobin continues to slowly trend down is now 9.2.  Previously, iron stores are within normal limits.  Continue to monitor closely.   4.  Nausea: Patient does not complain of this today.  Continue antiemetics as prescribed. 5.   Leukocytosis: Slowly resolving.  Secondary to Neulasta.  Patient expressed understanding and was in agreement with this plan. She also understands that She can call clinic at any time with any questions, concerns, or complaints.   Cancer Staging Malignant neoplasm of ovary [facility]) Staging form: Ovary, Fallopian Tube, and Primary Peritoneal Carcinoma, AJCC 8th Edition - Clinical stage from 05/29/2017: Stage IIIC (cT3c, cN1b, cM0) - Signed by Lloyd Huger, MD on 05/29/2017   Lloyd Huger, MD   10/21/2018 1:48 PM

## 2018-10-20 ENCOUNTER — Encounter: Payer: Self-pay | Admitting: Oncology

## 2018-10-20 ENCOUNTER — Inpatient Hospital Stay (HOSPITAL_BASED_OUTPATIENT_CLINIC_OR_DEPARTMENT_OTHER): Payer: Medicare Other | Admitting: Oncology

## 2018-10-20 ENCOUNTER — Other Ambulatory Visit: Payer: Self-pay

## 2018-10-20 ENCOUNTER — Inpatient Hospital Stay: Payer: Medicare Other | Attending: Oncology

## 2018-10-20 ENCOUNTER — Inpatient Hospital Stay: Payer: Medicare Other

## 2018-10-20 VITALS — BP 144/85 | HR 99 | Temp 97.5°F | Wt 99.2 lb

## 2018-10-20 DIAGNOSIS — Z800 Family history of malignant neoplasm of digestive organs: Secondary | ICD-10-CM | POA: Insufficient documentation

## 2018-10-20 DIAGNOSIS — D649 Anemia, unspecified: Secondary | ICD-10-CM | POA: Insufficient documentation

## 2018-10-20 DIAGNOSIS — Z801 Family history of malignant neoplasm of trachea, bronchus and lung: Secondary | ICD-10-CM

## 2018-10-20 DIAGNOSIS — C569 Malignant neoplasm of unspecified ovary: Secondary | ICD-10-CM

## 2018-10-20 DIAGNOSIS — Z79899 Other long term (current) drug therapy: Secondary | ICD-10-CM | POA: Insufficient documentation

## 2018-10-20 DIAGNOSIS — Z5111 Encounter for antineoplastic chemotherapy: Secondary | ICD-10-CM | POA: Diagnosis not present

## 2018-10-20 DIAGNOSIS — C786 Secondary malignant neoplasm of retroperitoneum and peritoneum: Secondary | ICD-10-CM | POA: Insufficient documentation

## 2018-10-20 DIAGNOSIS — D701 Agranulocytosis secondary to cancer chemotherapy: Secondary | ICD-10-CM | POA: Diagnosis not present

## 2018-10-20 DIAGNOSIS — D6862 Lupus anticoagulant syndrome: Secondary | ICD-10-CM

## 2018-10-20 DIAGNOSIS — T451X5S Adverse effect of antineoplastic and immunosuppressive drugs, sequela: Secondary | ICD-10-CM | POA: Insufficient documentation

## 2018-10-20 DIAGNOSIS — K219 Gastro-esophageal reflux disease without esophagitis: Secondary | ICD-10-CM

## 2018-10-20 DIAGNOSIS — D72829 Elevated white blood cell count, unspecified: Secondary | ICD-10-CM

## 2018-10-20 DIAGNOSIS — M1990 Unspecified osteoarthritis, unspecified site: Secondary | ICD-10-CM | POA: Insufficient documentation

## 2018-10-20 DIAGNOSIS — I10 Essential (primary) hypertension: Secondary | ICD-10-CM

## 2018-10-20 DIAGNOSIS — J45909 Unspecified asthma, uncomplicated: Secondary | ICD-10-CM | POA: Diagnosis not present

## 2018-10-20 DIAGNOSIS — Z792 Long term (current) use of antibiotics: Secondary | ICD-10-CM | POA: Insufficient documentation

## 2018-10-20 LAB — COMPREHENSIVE METABOLIC PANEL
ALT: 14 U/L (ref 0–44)
AST: 23 U/L (ref 15–41)
Albumin: 3.9 g/dL (ref 3.5–5.0)
Alkaline Phosphatase: 160 U/L — ABNORMAL HIGH (ref 38–126)
Anion gap: 6 (ref 5–15)
BUN: 26 mg/dL — ABNORMAL HIGH (ref 8–23)
CO2: 24 mmol/L (ref 22–32)
Calcium: 8.6 mg/dL — ABNORMAL LOW (ref 8.9–10.3)
Chloride: 101 mmol/L (ref 98–111)
Creatinine, Ser: 0.83 mg/dL (ref 0.44–1.00)
GFR calc Af Amer: >60 mL/min (ref >60)
GFR calc non Af Amer: >60 mL/min (ref >60)
Glucose, Bld: 97 mg/dL (ref 70–99)
Potassium: 4.6 mmol/L (ref 3.5–5.1)
Sodium: 131 mmol/L — ABNORMAL LOW (ref 135–145)
Total Bilirubin: 0.4 mg/dL (ref 0.3–1.2)
Total Protein: 6.9 g/dL (ref 6.5–8.1)

## 2018-10-20 LAB — CBC WITH DIFFERENTIAL/PLATELET
Abs Immature Granulocytes: 0.64 10*3/uL — ABNORMAL HIGH (ref 0.00–0.07)
Basophils Absolute: 0.1 10*3/uL (ref 0.0–0.1)
Basophils Relative: 0 %
Eosinophils Absolute: 0 10*3/uL (ref 0.0–0.5)
Eosinophils Relative: 0 %
HCT: 28.5 % — ABNORMAL LOW (ref 36.0–46.0)
Hemoglobin: 9.2 g/dL — ABNORMAL LOW (ref 12.0–15.0)
Immature Granulocytes: 4 %
Lymphocytes Relative: 7 %
Lymphs Abs: 1.1 10*3/uL (ref 0.7–4.0)
MCH: 33.1 pg (ref 26.0–34.0)
MCHC: 32.3 g/dL (ref 30.0–36.0)
MCV: 102.5 fL — ABNORMAL HIGH (ref 80.0–100.0)
Monocytes Absolute: 0.9 10*3/uL (ref 0.1–1.0)
Monocytes Relative: 5 %
Neutro Abs: 13.8 10*3/uL — ABNORMAL HIGH (ref 1.7–7.7)
Neutrophils Relative %: 84 %
Platelets: 193 10*3/uL (ref 150–400)
RBC: 2.78 MIL/uL — ABNORMAL LOW (ref 3.87–5.11)
RDW: 15.2 % (ref 11.5–15.5)
WBC: 16.5 10*3/uL — ABNORMAL HIGH (ref 4.0–10.5)
nRBC: 0 % (ref 0.0–0.2)

## 2018-10-20 MED ORDER — DEXAMETHASONE SODIUM PHOSPHATE 10 MG/ML IJ SOLN
10.0000 mg | Freq: Once | INTRAMUSCULAR | Status: AC
  Administered 2018-10-20: 10 mg via INTRAVENOUS
  Filled 2018-10-20: qty 1

## 2018-10-20 MED ORDER — DIPHENHYDRAMINE HCL 50 MG/ML IJ SOLN
25.0000 mg | Freq: Once | INTRAMUSCULAR | Status: AC
  Administered 2018-10-20: 25 mg via INTRAVENOUS
  Filled 2018-10-20: qty 1

## 2018-10-20 MED ORDER — ONDANSETRON HCL 8 MG PO TABS: 8.0000 mg | ORAL_TABLET | Freq: Two times a day (BID) | ORAL | 2 refills | Status: DC | PRN

## 2018-10-20 MED ORDER — SODIUM CHLORIDE 0.9 % IV SOLN
Freq: Once | INTRAVENOUS | Status: AC
  Administered 2018-10-20: 11:00:00 via INTRAVENOUS
  Filled 2018-10-20: qty 250

## 2018-10-20 MED ORDER — SODIUM CHLORIDE 0.9 % IV SOLN
72.0000 mg/m2 | Freq: Once | INTRAVENOUS | Status: AC
  Administered 2018-10-20: 96 mg via INTRAVENOUS
  Filled 2018-10-20: qty 16

## 2018-10-20 MED ORDER — FAMOTIDINE IN NACL 20-0.9 MG/50ML-% IV SOLN
20.0000 mg | Freq: Once | INTRAVENOUS | Status: AC
  Administered 2018-10-20: 20 mg via INTRAVENOUS
  Filled 2018-10-20: qty 50

## 2018-10-20 MED ORDER — HEPARIN SOD (PORK) LOCK FLUSH 100 UNIT/ML IV SOLN
500.0000 [IU] | Freq: Once | INTRAVENOUS | Status: AC
  Administered 2018-10-20: 500 [IU] via INTRAVENOUS

## 2018-10-20 NOTE — Progress Notes (Signed)
Patient is here today to follow up on her Malignant neoplasm of ovary. Patient is requesting a refill on her ondansetron.

## 2018-10-21 LAB — CA 125: Cancer Antigen (CA) 125: 78.1 U/mL — ABNORMAL HIGH (ref 0.0–38.1)

## 2018-10-23 NOTE — Progress Notes (Signed)
[name]  [phone]) [phone] Fax:(336) [phone]  ID: Tamara Mckinney OB: October 29, 1927  MR#: [id number]  GMW#:[id number]  Patient Care Team: [name], MD as PCP - General (Family Medicine) [name], RN as Registered Nurse Herbert Pun, MD as Consulting Physician (General Surgery)  CHIEF COMPLAINT: Stage IIIC ovarian cancer.  INTERVAL HISTORY: Patient returns to clinic today for further evaluation and consideration of cycle 1, day 15 of carboplatin and Taxol.  Taxol only today.  She continues to feel well and remains asymptomatic. She has no neurologic complaints. She has a fair appetite and denies weight loss.  She denies any recent fevers or illnesses.  She denies any chest pain or shortness of breath. She has no nausea, vomiting, constipation, or diarrhea. She has no urinary complaints.  Patient offers no specific complaints today.  REVIEW OF SYSTEMS:   Review of Systems  Constitutional: Negative.  Negative for fever, malaise/fatigue and weight loss.  Respiratory: Negative.  Negative for cough and shortness of breath.   Cardiovascular: Negative.  Negative for chest pain and leg swelling.  Gastrointestinal: Negative.  Negative for abdominal pain, blood in stool, constipation, diarrhea, heartburn, melena, nausea and vomiting.  Genitourinary: Negative.  Negative for dysuria.  Musculoskeletal: Negative.  Negative for back pain and neck pain.  Skin: Negative.  Negative for rash.  Neurological: Negative.  Negative for dizziness, focal weakness and weakness.  Endo/Heme/Allergies: Does not bruise/bleed easily.  Psychiatric/Behavioral: Negative.  The patient is not nervous/anxious.     As per HPI. Otherwise, a complete review of systems is negative.  PAST MEDICAL HISTORY: Past Medical History:  Diagnosis Date  . Anemia   . Arthritis   . Asthma   . Cancer ([place])   . Dyspnea   . GERD (gastroesophageal reflux disease)   . Hypertension      PAST SURGICAL HISTORY: Past Surgical History:  Procedure Laterality Date  . ABDOMINAL HYSTERECTOMY    . BREAST BIOPSY     x6  . BUNIONECTOMY Bilateral   . CATARACT EXTRACTION, BILATERAL    . FLEXIBLE SIGMOIDOSCOPY N/A 05/21/2017   Procedure: FLEXIBLE SIGMOIDOSCOPY;  Surgeon: Lin Landsman, MD;  Location: [facility] ENDOSCOPY;  Service: Gastroenterology;  Laterality: N/A;  . INCONTINENCE SURGERY    . PORTA CATH INSERTION N/A 06/16/2017   Procedure: PORTA CATH INSERTION;  Surgeon: Algernon Huxley, MD;  Location: [place] [facility] LAB;  Service: Cardiovascular;  Laterality: N/A;  . RECTAL SURGERY  04/2018  . REPAIR OF RECTAL PROLAPSE N/A 05/11/2017   Procedure: REPAIR OF RECTAL PROLAPSE;  Surgeon: Leonie Green, MD;  Location: [facility] ORS;  Service: General;  Laterality: N/A;  . TONSILLECTOMY    . VAGINA SURGERY     uncertain procedure performed    FAMILY HISTORY: Family History  Problem Relation Age of Onset  . Heart disease Mother   . Heart disease Father   . Heart disease Sister   . Heart attack Sister   . Ulcerative colitis Brother   . Lung cancer Brother   . Thyroid cancer Sister   . Asthma Sister   . Diabetes Sister   . Asthma Sister   . Pancreatic cancer Sister   . Dementia Sister   . Asthma Brother   . Heart disease Brother   . Asthma Brother   . Lung cancer Brother   . Asthma Brother   . Lung cancer Brother   . Lung cancer Brother   . Rheum arthritis Brother     ADVANCED  DIRECTIVES (Y/N):  N  HEALTH MAINTENANCE: Social History   Tobacco Use  . Smoking status: Never Smoker  . Smokeless tobacco: Never Used  Substance Use Topics  . Alcohol use: No  . Drug use: No     Colonoscopy:  PAP:  Bone density:  Lipid panel:  No Known Allergies  Current Outpatient Medications  Medication Sig Dispense Refill  . albuterol (PROVENTIL HFA;VENTOLIN HFA) 108 (90 Base) MCG/ACT inhaler Inhale 1-2 puffs into the lungs every 6 (six) hours as needed for wheezing or  shortness of breath.    . cyclobenzaprine (FLEXERIL) 10 MG tablet Take 1 tablet (10 mg total) by mouth 3 (three) times daily as needed for muscle spasms. 30 tablet 1  . esomeprazole (NEXIUM) 40 MG capsule Take 40 mg by mouth daily before breakfast.     . fluticasone furoate-vilanterol (BREO ELLIPTA) 100-25 MCG/INH AEPB Inhale 1 puff into the lungs as needed.     . furosemide (LASIX) 40 MG tablet Take 40 mg by mouth as needed.     Marland Kitchen ipratropium-albuterol (DUONEB) 0.5-2.5 (3) MG/3ML SOLN Take 3 mLs by nebulization every 6 (six) hours as needed. For wheezing/shortness of breath    . lidocaine-prilocaine (EMLA) cream Apply 1 application topically as needed. Apply small amount to port site at least 1 hour prior to it being accessed, cover with plastic wrap 30 g 1  . montelukast (SINGULAIR) 10 MG tablet Take 10 mg by mouth at bedtime.    . ondansetron (ZOFRAN) 8 MG tablet Take 1 tablet (8 mg total) by mouth 2 (two) times daily as needed for nausea or vomiting. 30 tablet 2  . polyethylene glycol (MIRALAX / GLYCOLAX) packet Take by mouth.    . valsartan (DIOVAN) 160 MG tablet Start 1/2 tab a day.  Can increase to 1 tab after a few weeks if BP stays high     No current facility-administered medications for this visit.    Facility-Administered Medications Ordered in Other Visits  Medication Dose Route Frequency Provider Last Rate Last Dose  . sodium chloride flush (NS) 0.9 % injection 10 mL  10 mL Intravenous PRN Lloyd Huger, MD   10 mL at 03/10/18 1200    OBJECTIVE: Vitals:   10/27/18 1112  BP: 115/69  Pulse: 94     Body mass index is 21.72 kg/m.    ECOG FS:0 - Asymptomatic  General: Well-developed, well-nourished, no acute distress. Eyes: Pink conjunctiva, anicteric sclera. HEENT: Normocephalic, moist mucous membranes. Lungs: Clear to auscultation bilaterally. Heart: Regular rate and rhythm. No rubs, murmurs, or gallops. Abdomen: Soft, nontender, nondistended. No organomegaly noted,  normoactive bowel sounds. Musculoskeletal: No edema, cyanosis, or clubbing. Neuro: Alert, answering all questions appropriately. Cranial nerves grossly intact. Skin: No rashes or petechiae noted. Psych: Normal affect.  LAB RESULTS:  Lab Results  Component Value Date   NA 131 (L) 10/27/2018   K 4.7 10/27/2018   CL 100 10/27/2018   CO2 23 10/27/2018   GLUCOSE 106 (H) 10/27/2018   BUN 27 (H) 10/27/2018   CREATININE 0.89 10/27/2018   CALCIUM 8.6 (L) 10/27/2018   PROT 6.7 10/27/2018   ALBUMIN 4.0 10/27/2018   AST 23 10/27/2018   ALT 13 10/27/2018   ALKPHOS 113 10/27/2018   BILITOT 0.7 10/27/2018   GFRNONAA 57 (L) 10/27/2018   GFRAA >60 10/27/2018    Lab Results  Component Value Date   WBC 6.5 10/27/2018   NEUTROABS 5.5 10/27/2018   HGB 8.6 (L) 10/27/2018   HCT  26.2 (L) 10/27/2018   MCV 101.9 (H) 10/27/2018   PLT 201 10/27/2018     STUDIES: No results found.  ASSESSMENT: Stage IIIC ovarian cancer.  PLAN:    1. Stage IIIC ovarian cancer: Patient now has recurrent disease.  CT scan results from May 06, 2018 reviewed independently with multifocal peritoneal metastasis increasing in size and number.  Patient's CA-125 continues to trend down is now 38.3.  Proceed with cycle 1, day 15 of Taxol only today.  Return to clinic in 1 week for further evaluation and consideration of cycle 2, day 1.   2. Lupus anticoagulant: Patient was noted to have an elevated PTT as well as increased bleeding during her surgery for rectal prolapse. Continue to monitor closely and repeat lupus anticoagulant panel if patient has evidence of bleeding or requires additional procedures.   3. Anemia: Patient's hemoglobin continues to slowly trend down and is now 8.6.  Previously, iron stores are within normal limits.  Continue to monitor closely.   4.  Nausea: Patient does not complain of this today.  Continue antiemetics as prescribed. 5.  Leukocytosis: Resolved.  Patient expressed understanding  and was in agreement with this plan. She also understands that She can call clinic at any time with any questions, concerns, or complaints.   Cancer Staging Malignant neoplasm of ovary [facility]) Staging form: Ovary, Fallopian Tube, and Primary Peritoneal Carcinoma, AJCC 8th Edition - Clinical stage from 05/29/2017: Stage IIIC (cT3c, cN1b, cM0) - Signed by Lloyd Huger, MD on 05/29/2017   Lloyd Huger, MD   10/29/2018 8:37 AM

## 2018-10-27 ENCOUNTER — Inpatient Hospital Stay: Payer: Medicare Other

## 2018-10-27 ENCOUNTER — Encounter: Payer: Self-pay | Admitting: Oncology

## 2018-10-27 ENCOUNTER — Inpatient Hospital Stay (HOSPITAL_BASED_OUTPATIENT_CLINIC_OR_DEPARTMENT_OTHER): Payer: Medicare Other | Admitting: Oncology

## 2018-10-27 ENCOUNTER — Other Ambulatory Visit: Payer: Self-pay

## 2018-10-27 VITALS — BP 115/69 | HR 94 | Wt 100.4 lb

## 2018-10-27 DIAGNOSIS — D6862 Lupus anticoagulant syndrome: Secondary | ICD-10-CM | POA: Diagnosis not present

## 2018-10-27 DIAGNOSIS — Z801 Family history of malignant neoplasm of trachea, bronchus and lung: Secondary | ICD-10-CM

## 2018-10-27 DIAGNOSIS — C786 Secondary malignant neoplasm of retroperitoneum and peritoneum: Secondary | ICD-10-CM

## 2018-10-27 DIAGNOSIS — M1990 Unspecified osteoarthritis, unspecified site: Secondary | ICD-10-CM

## 2018-10-27 DIAGNOSIS — Z79899 Other long term (current) drug therapy: Secondary | ICD-10-CM

## 2018-10-27 DIAGNOSIS — J45909 Unspecified asthma, uncomplicated: Secondary | ICD-10-CM

## 2018-10-27 DIAGNOSIS — C569 Malignant neoplasm of unspecified ovary: Secondary | ICD-10-CM

## 2018-10-27 DIAGNOSIS — Z800 Family history of malignant neoplasm of digestive organs: Secondary | ICD-10-CM

## 2018-10-27 DIAGNOSIS — K219 Gastro-esophageal reflux disease without esophagitis: Secondary | ICD-10-CM

## 2018-10-27 DIAGNOSIS — D649 Anemia, unspecified: Secondary | ICD-10-CM | POA: Diagnosis not present

## 2018-10-27 DIAGNOSIS — I10 Essential (primary) hypertension: Secondary | ICD-10-CM

## 2018-10-27 LAB — CBC WITH DIFFERENTIAL/PLATELET
Abs Immature Granulocytes: 0.03 10*3/uL (ref 0.00–0.07)
Basophils Absolute: 0 10*3/uL (ref 0.0–0.1)
Basophils Relative: 0 %
Eosinophils Absolute: 0 10*3/uL (ref 0.0–0.5)
Eosinophils Relative: 0 %
HCT: 26.2 % — ABNORMAL LOW (ref 36.0–46.0)
Hemoglobin: 8.6 g/dL — ABNORMAL LOW (ref 12.0–15.0)
Immature Granulocytes: 1 %
Lymphocytes Relative: 9 %
Lymphs Abs: 0.6 10*3/uL — ABNORMAL LOW (ref 0.7–4.0)
MCH: 33.5 pg (ref 26.0–34.0)
MCHC: 32.8 g/dL (ref 30.0–36.0)
MCV: 101.9 fL — ABNORMAL HIGH (ref 80.0–100.0)
Monocytes Absolute: 0.4 10*3/uL (ref 0.1–1.0)
Monocytes Relative: 6 %
Neutro Abs: 5.5 10*3/uL (ref 1.7–7.7)
Neutrophils Relative %: 84 %
Platelets: 201 10*3/uL (ref 150–400)
RBC: 2.57 MIL/uL — ABNORMAL LOW (ref 3.87–5.11)
RDW: 14.8 % (ref 11.5–15.5)
WBC: 6.5 10*3/uL (ref 4.0–10.5)
nRBC: 0 % (ref 0.0–0.2)

## 2018-10-27 LAB — COMPREHENSIVE METABOLIC PANEL
ALT: 13 U/L (ref 0–44)
AST: 23 U/L (ref 15–41)
Albumin: 4 g/dL (ref 3.5–5.0)
Alkaline Phosphatase: 113 U/L (ref 38–126)
Anion gap: 8 (ref 5–15)
BUN: 27 mg/dL — ABNORMAL HIGH (ref 8–23)
CO2: 23 mmol/L (ref 22–32)
Calcium: 8.6 mg/dL — ABNORMAL LOW (ref 8.9–10.3)
Chloride: 100 mmol/L (ref 98–111)
Creatinine, Ser: 0.89 mg/dL (ref 0.44–1.00)
GFR calc Af Amer: >60 mL/min (ref >60)
GFR calc non Af Amer: 57 mL/min — ABNORMAL LOW (ref >60)
Glucose, Bld: 106 mg/dL — ABNORMAL HIGH (ref 70–99)
Potassium: 4.7 mmol/L (ref 3.5–5.1)
Sodium: 131 mmol/L — ABNORMAL LOW (ref 135–145)
Total Bilirubin: 0.7 mg/dL (ref 0.3–1.2)
Total Protein: 6.7 g/dL (ref 6.5–8.1)

## 2018-10-27 MED ORDER — DIPHENHYDRAMINE HCL 50 MG/ML IJ SOLN
25.0000 mg | Freq: Once | INTRAMUSCULAR | Status: AC
  Administered 2018-10-27: 25 mg via INTRAVENOUS
  Filled 2018-10-27: qty 1

## 2018-10-27 MED ORDER — FAMOTIDINE IN NACL 20-0.9 MG/50ML-% IV SOLN
20.0000 mg | Freq: Once | INTRAVENOUS | Status: AC
  Administered 2018-10-27: 20 mg via INTRAVENOUS
  Filled 2018-10-27: qty 50

## 2018-10-27 MED ORDER — HEPARIN SOD (PORK) LOCK FLUSH 100 UNIT/ML IV SOLN
500.0000 [IU] | Freq: Once | INTRAVENOUS | Status: AC
  Administered 2018-10-27: 500 [IU] via INTRAVENOUS
  Filled 2018-10-27: qty 5

## 2018-10-27 MED ORDER — HEPARIN SOD (PORK) LOCK FLUSH 100 UNIT/ML IV SOLN: 500.0000 [IU] | Freq: Once | INTRAVENOUS | Status: AC

## 2018-10-27 MED ORDER — SODIUM CHLORIDE 0.9 % IV SOLN
72.0000 mg/m2 | Freq: Once | INTRAVENOUS | Status: AC
  Administered 2018-10-27: 96 mg via INTRAVENOUS
  Filled 2018-10-27: qty 16

## 2018-10-27 MED ORDER — SODIUM CHLORIDE 0.9% FLUSH
10.0000 mL | Freq: Once | INTRAVENOUS | Status: AC
  Administered 2018-10-27: 10 mL via INTRAVENOUS
  Filled 2018-10-27: qty 10

## 2018-10-27 MED ORDER — SODIUM CHLORIDE 0.9 % IV SOLN
Freq: Once | INTRAVENOUS | Status: AC
  Administered 2018-10-27: 12:00:00 via INTRAVENOUS
  Filled 2018-10-27: qty 250

## 2018-10-27 MED ORDER — DEXAMETHASONE SODIUM PHOSPHATE 10 MG/ML IJ SOLN
10.0000 mg | Freq: Once | INTRAMUSCULAR | Status: AC
  Administered 2018-10-27: 10 mg via INTRAVENOUS
  Filled 2018-10-27: qty 1

## 2018-10-27 NOTE — Progress Notes (Signed)
Patient here today for follow up and chemotherapy.  Patient states no new concerns today  

## 2018-10-28 LAB — CA 125: Cancer Antigen (CA) 125: 38.3 U/mL — ABNORMAL HIGH (ref 0.0–38.1)

## 2018-11-02 NOTE — Progress Notes (Signed)
[place]  [phone] Fax:(336) [phone]  ID: Tamara Mckinney OB: 12/09/1927  MR#: [id number]  ZDG#:[id number]  Patient Care Team: [name], MD as PCP - General (Family Medicine) [name], RN as Registered Nurse Herbert Pun, MD as Consulting Physician (General Surgery)  CHIEF COMPLAINT: Stage IIIC ovarian cancer.  INTERVAL HISTORY: Patient returns to clinic today for further evaluation and consideration of cycle 2, day 1 of carboplatin and Taxol.  She had low-grade fever and congestion earlier this week and was placed on antibiotic.  Her symptoms have now resolved and she is back to her baseline.  She currently feels well and remains asymptomatic. She has no neurologic complaints. She has a fair appetite and denies weight loss. She denies any chest pain or shortness of breath. She has no nausea, vomiting, constipation, or diarrhea. She has no urinary complaints.  Patient offers no further specific complaints today.  REVIEW OF SYSTEMS:   Review of Systems  Constitutional: Negative.  Negative for fever, malaise/fatigue and weight loss.  Respiratory: Negative.  Negative for cough and shortness of breath.   Cardiovascular: Negative.  Negative for chest pain and leg swelling.  Gastrointestinal: Negative.  Negative for abdominal pain, blood in stool, constipation, diarrhea, heartburn, melena, nausea and vomiting.  Genitourinary: Negative.  Negative for dysuria.  Musculoskeletal: Negative.  Negative for back pain and neck pain.  Skin: Negative.  Negative for rash.  Neurological: Negative.  Negative for dizziness, focal weakness and weakness.  Endo/Heme/Allergies: Does not bruise/bleed easily.  Psychiatric/Behavioral: Negative.  The patient is not nervous/anxious.     As per HPI. Otherwise, a complete review of systems is negative.  PAST MEDICAL HISTORY: Past Medical History:  Diagnosis Date  . Anemia   . Arthritis   . Asthma    . Cancer ([place])   . Dyspnea   . GERD (gastroesophageal reflux disease)   . Hypertension     PAST SURGICAL HISTORY: Past Surgical History:  Procedure Laterality Date  . ABDOMINAL HYSTERECTOMY    . BREAST BIOPSY     x6  . BUNIONECTOMY Bilateral   . CATARACT EXTRACTION, BILATERAL    . FLEXIBLE SIGMOIDOSCOPY N/A 05/21/2017   Procedure: FLEXIBLE SIGMOIDOSCOPY;  Surgeon: Lin Landsman, MD;  Location: [facility] ENDOSCOPY;  Service: Gastroenterology;  Laterality: N/A;  . INCONTINENCE SURGERY    . PORTA CATH INSERTION N/A 06/16/2017   Procedure: PORTA CATH INSERTION;  Surgeon: Algernon Huxley, MD;  Location: [place] CV LAB;  Service: Cardiovascular;  Laterality: N/A;  . RECTAL SURGERY  04/2018  . REPAIR OF RECTAL PROLAPSE N/A 05/11/2017   Procedure: REPAIR OF RECTAL PROLAPSE;  Surgeon: Leonie Green, MD;  Location: [facility] ORS;  Service: General;  Laterality: N/A;  . TONSILLECTOMY    . VAGINA SURGERY     uncertain procedure performed    FAMILY HISTORY: Family History  Problem Relation Age of Onset  . Heart disease Mother   . Heart disease Father   . Heart disease Sister   . Heart attack Sister   . Ulcerative colitis Brother   . Lung cancer Brother   . Thyroid cancer Sister   . Asthma Sister   . Diabetes Sister   . Asthma Sister   . Pancreatic cancer Sister   . Dementia Sister   . Asthma Brother   . Heart disease Brother   . Asthma Brother   . Lung cancer Brother   . Asthma Brother   . Lung cancer Brother   .  Lung cancer Brother   . Rheum arthritis Brother     ADVANCED DIRECTIVES (Y/N):  N  HEALTH MAINTENANCE: Social History   Tobacco Use  . Smoking status: Never Smoker  . Smokeless tobacco: Never Used  Substance Use Topics  . Alcohol use: No  . Drug use: No     Colonoscopy:  PAP:  Bone density:  Lipid panel:  No Known Allergies  Current Outpatient Medications  Medication Sig Dispense Refill  . albuterol (PROVENTIL HFA;VENTOLIN HFA) 108 (90 Base)  MCG/ACT inhaler Inhale 1-2 puffs into the lungs every 6 (six) hours as needed for wheezing or shortness of breath.    Marland Kitchen amoxicillin-clavulanate (AUGMENTIN) 875-125 MG tablet Take 1 tablet by mouth every 12 (twelve) hours.    . benzonatate (TESSALON) 100 MG capsule Take 1 capsule by mouth 3 (three) times daily.    Marland Kitchen esomeprazole (NEXIUM) 40 MG capsule Take 40 mg by mouth daily before breakfast.     . lidocaine-prilocaine (EMLA) cream Apply 1 application topically as needed. Apply small amount to port site at least 1 hour prior to it being accessed, cover with plastic wrap 30 g 1  . montelukast (SINGULAIR) 10 MG tablet Take 10 mg by mouth at bedtime.    . valsartan (DIOVAN) 160 MG tablet Start 1/2 tab a day.  Can increase to 1 tab after a few weeks if BP stays high    . cyclobenzaprine (FLEXERIL) 10 MG tablet Take 1 tablet (10 mg total) by mouth 3 (three) times daily as needed for muscle spasms. (Patient not taking: Reported on 11/03/2018) 30 tablet 1  . fluticasone furoate-vilanterol (BREO ELLIPTA) 100-25 MCG/INH AEPB Inhale 1 puff into the lungs as needed.     . furosemide (LASIX) 40 MG tablet Take 40 mg by mouth as needed.     Marland Kitchen ipratropium-albuterol (DUONEB) 0.5-2.5 (3) MG/3ML SOLN Take 3 mLs by nebulization every 6 (six) hours as needed. For wheezing/shortness of breath    . ondansetron (ZOFRAN) 8 MG tablet Take 1 tablet (8 mg total) by mouth 2 (two) times daily as needed for nausea or vomiting. (Patient not taking: Reported on 11/03/2018) 30 tablet 2  . polyethylene glycol (MIRALAX / GLYCOLAX) packet Take by mouth.     No current facility-administered medications for this visit.    Facility-Administered Medications Ordered in Other Visits  Medication Dose Route Frequency Provider Last Rate Last Dose  . CARBOplatin (PARAPLATIN) 210 mg in sodium chloride 0.9 % 250 mL chemo infusion  210 mg Intravenous Once Lloyd Huger, MD      . famotidine (PEPCID) IVPB 20 mg premix  20 mg Intravenous Once  Lloyd Huger, MD      . heparin lock flush 100 unit/mL  500 Units Intracatheter Once PRN Lloyd Huger, MD      . PACLitaxel (TAXOL) 96 mg in sodium chloride 0.9 % 250 mL chemo infusion (</= 80mg /m2)  72 mg/m2 (Order-Specific) Intravenous Once Lloyd Huger, MD      . palonosetron (ALOXI) injection 0.25 mg  0.25 mg Intravenous Once Lloyd Huger, MD      . sodium chloride flush (NS) 0.9 % injection 10 mL  10 mL Intravenous PRN Lloyd Huger, MD   10 mL at 03/10/18 1200    OBJECTIVE: Vitals:   11/03/18 0937  BP: 129/76  Pulse: 89  Resp: 18  Temp: (!) 97.5 F (36.4 C)     Body mass index is 21.88 kg/m.    ECOG  FS:0 - Asymptomatic  General: Well-developed, well-nourished, no acute distress. Eyes: Pink conjunctiva, anicteric sclera. HEENT: Normocephalic, moist mucous membranes. Lungs: Clear to auscultation bilaterally. Heart: Regular rate and rhythm. No rubs, murmurs, or gallops. Abdomen: Soft, nontender, nondistended. No organomegaly noted, normoactive bowel sounds. Musculoskeletal: No edema, cyanosis, or clubbing. Neuro: Alert, answering all questions appropriately. Cranial nerves grossly intact. Skin: No rashes or petechiae noted. Psych: Normal affect.  LAB RESULTS:  Lab Results  Component Value Date   NA 131 (L) 11/03/2018   K 4.1 11/03/2018   CL 102 11/03/2018   CO2 21 (L) 11/03/2018   GLUCOSE 111 (H) 11/03/2018   BUN 16 11/03/2018   CREATININE 0.77 11/03/2018   CALCIUM 8.5 (L) 11/03/2018   PROT 6.5 11/03/2018   ALBUMIN 3.6 11/03/2018   AST 22 11/03/2018   ALT 12 11/03/2018   ALKPHOS 98 11/03/2018   BILITOT 0.5 11/03/2018   GFRNONAA >60 11/03/2018   GFRAA >60 11/03/2018    Lab Results  Component Value Date   WBC 2.3 (L) 11/03/2018   NEUTROABS 1.3 (L) 11/03/2018   HGB 8.4 (L) 11/03/2018   HCT 26.0 (L) 11/03/2018   MCV 104.0 (H) 11/03/2018   PLT 224 11/03/2018     STUDIES: No results found.  ASSESSMENT: Stage IIIC ovarian  cancer.  PLAN:    1. Stage IIIC ovarian cancer: Patient now has recurrent disease.  CT scan results from May 06, 2018 reviewed independently with multifocal peritoneal metastasis increasing in size and number.  Patient's CA-125 continues to trend down is now 38.3, today's result is pending.  Proceed with cycle 2, day 1 of carboplatinum and Taxol today.  Given patient's mild neutropenia she will require Neulasta tomorrow.  Patient will then return to clinic in 2 weeks which will be considered cycle 2, day 8.  Taxol only. 2. Lupus anticoagulant: Patient was noted to have an elevated PTT as well as increased bleeding during her surgery for rectal prolapse. Continue to monitor closely and repeat lupus anticoagulant panel if patient has evidence of bleeding or requires additional procedures.   3. Anemia: Patient's hemoglobin is decreased, but relatively stable at 8.4.  Previously, iron stores are within normal limits.  Continue to monitor closely.   4.  Nausea: Patient does not complain of this today.  Continue antiemetics as prescribed. 5.  Neutropenia: Proceed with treatment as above.  Neulasta tomorrow.  Patient expressed understanding and was in agreement with this plan. She also understands that She can call clinic at any time with any questions, concerns, or complaints.   Cancer Staging Malignant neoplasm of ovary [facility]) Staging form: Ovary, Fallopian Tube, and Primary Peritoneal Carcinoma, AJCC 8th Edition - Clinical stage from 05/29/2017: Stage IIIC (cT3c, cN1b, cM0) - Signed by Lloyd Huger, MD on 05/29/2017   Lloyd Huger, MD   11/03/2018 10:45 AM

## 2018-11-03 ENCOUNTER — Other Ambulatory Visit: Payer: Self-pay

## 2018-11-03 ENCOUNTER — Inpatient Hospital Stay: Payer: Medicare Other

## 2018-11-03 ENCOUNTER — Inpatient Hospital Stay (HOSPITAL_BASED_OUTPATIENT_CLINIC_OR_DEPARTMENT_OTHER): Payer: Medicare Other | Admitting: Oncology

## 2018-11-03 ENCOUNTER — Encounter: Payer: Self-pay | Admitting: Oncology

## 2018-11-03 VITALS — BP 129/76 | HR 89 | Temp 97.5°F | Resp 18 | Wt 101.1 lb

## 2018-11-03 DIAGNOSIS — I10 Essential (primary) hypertension: Secondary | ICD-10-CM

## 2018-11-03 DIAGNOSIS — C569 Malignant neoplasm of unspecified ovary: Secondary | ICD-10-CM | POA: Diagnosis not present

## 2018-11-03 DIAGNOSIS — Z801 Family history of malignant neoplasm of trachea, bronchus and lung: Secondary | ICD-10-CM

## 2018-11-03 DIAGNOSIS — Z95828 Presence of other vascular implants and grafts: Secondary | ICD-10-CM

## 2018-11-03 DIAGNOSIS — C786 Secondary malignant neoplasm of retroperitoneum and peritoneum: Secondary | ICD-10-CM | POA: Diagnosis not present

## 2018-11-03 DIAGNOSIS — M1990 Unspecified osteoarthritis, unspecified site: Secondary | ICD-10-CM

## 2018-11-03 DIAGNOSIS — J45909 Unspecified asthma, uncomplicated: Secondary | ICD-10-CM

## 2018-11-03 DIAGNOSIS — D701 Agranulocytosis secondary to cancer chemotherapy: Secondary | ICD-10-CM | POA: Diagnosis not present

## 2018-11-03 DIAGNOSIS — D6862 Lupus anticoagulant syndrome: Secondary | ICD-10-CM

## 2018-11-03 DIAGNOSIS — D649 Anemia, unspecified: Secondary | ICD-10-CM | POA: Diagnosis not present

## 2018-11-03 DIAGNOSIS — K219 Gastro-esophageal reflux disease without esophagitis: Secondary | ICD-10-CM

## 2018-11-03 DIAGNOSIS — T451X5S Adverse effect of antineoplastic and immunosuppressive drugs, sequela: Secondary | ICD-10-CM

## 2018-11-03 DIAGNOSIS — Z7982 Long term (current) use of aspirin: Secondary | ICD-10-CM

## 2018-11-03 DIAGNOSIS — Z79899 Other long term (current) drug therapy: Secondary | ICD-10-CM

## 2018-11-03 LAB — CBC WITH DIFFERENTIAL/PLATELET
Abs Immature Granulocytes: 0.01 10*3/uL (ref 0.00–0.07)
Basophils Absolute: 0 10*3/uL (ref 0.0–0.1)
Basophils Relative: 1 %
Eosinophils Absolute: 0 10*3/uL (ref 0.0–0.5)
Eosinophils Relative: 0 %
HCT: 26 % — ABNORMAL LOW (ref 36.0–46.0)
Hemoglobin: 8.4 g/dL — ABNORMAL LOW (ref 12.0–15.0)
Immature Granulocytes: 0 %
Lymphocytes Relative: 28 %
Lymphs Abs: 0.6 10*3/uL — ABNORMAL LOW (ref 0.7–4.0)
MCH: 33.6 pg (ref 26.0–34.0)
MCHC: 32.3 g/dL (ref 30.0–36.0)
MCV: 104 fL — ABNORMAL HIGH (ref 80.0–100.0)
Monocytes Absolute: 0.3 10*3/uL (ref 0.1–1.0)
Monocytes Relative: 15 %
Neutro Abs: 1.3 10*3/uL — ABNORMAL LOW (ref 1.7–7.7)
Neutrophils Relative %: 56 %
Platelets: 224 10*3/uL (ref 150–400)
RBC: 2.5 MIL/uL — ABNORMAL LOW (ref 3.87–5.11)
RDW: 14.9 % (ref 11.5–15.5)
WBC: 2.3 10*3/uL — ABNORMAL LOW (ref 4.0–10.5)
nRBC: 0 % (ref 0.0–0.2)

## 2018-11-03 LAB — COMPREHENSIVE METABOLIC PANEL
ALT: 12 U/L (ref 0–44)
AST: 22 U/L (ref 15–41)
Albumin: 3.6 g/dL (ref 3.5–5.0)
Alkaline Phosphatase: 98 U/L (ref 38–126)
Anion gap: 8 (ref 5–15)
BUN: 16 mg/dL (ref 8–23)
CO2: 21 mmol/L — ABNORMAL LOW (ref 22–32)
Calcium: 8.5 mg/dL — ABNORMAL LOW (ref 8.9–10.3)
Chloride: 102 mmol/L (ref 98–111)
Creatinine, Ser: 0.77 mg/dL (ref 0.44–1.00)
GFR calc Af Amer: >60 mL/min (ref >60)
GFR calc non Af Amer: >60 mL/min (ref >60)
Glucose, Bld: 111 mg/dL — ABNORMAL HIGH (ref 70–99)
Potassium: 4.1 mmol/L (ref 3.5–5.1)
Sodium: 131 mmol/L — ABNORMAL LOW (ref 135–145)
Total Bilirubin: 0.5 mg/dL (ref 0.3–1.2)
Total Protein: 6.5 g/dL (ref 6.5–8.1)

## 2018-11-03 MED ORDER — DIPHENHYDRAMINE HCL 50 MG/ML IJ SOLN
25.0000 mg | Freq: Once | INTRAMUSCULAR | Status: AC
  Administered 2018-11-03: 25 mg via INTRAVENOUS
  Filled 2018-11-03: qty 1

## 2018-11-03 MED ORDER — DEXAMETHASONE SODIUM PHOSPHATE 10 MG/ML IJ SOLN
10.0000 mg | Freq: Once | INTRAMUSCULAR | Status: AC
  Administered 2018-11-03: 10 mg via INTRAVENOUS
  Filled 2018-11-03: qty 1

## 2018-11-03 MED ORDER — FAMOTIDINE IN NACL 20-0.9 MG/50ML-% IV SOLN
20.0000 mg | Freq: Once | INTRAVENOUS | Status: AC
  Administered 2018-11-03: 20 mg via INTRAVENOUS
  Filled 2018-11-03: qty 50

## 2018-11-03 MED ORDER — SODIUM CHLORIDE 0.9 % IV SOLN
209.2000 mg | Freq: Once | INTRAVENOUS | Status: AC
  Administered 2018-11-03: 210 mg via INTRAVENOUS
  Filled 2018-11-03: qty 21

## 2018-11-03 MED ORDER — SODIUM CHLORIDE 0.9% FLUSH
10.0000 mL | Freq: Once | INTRAVENOUS | Status: AC
  Administered 2018-11-03: 10 mL via INTRAVENOUS
  Filled 2018-11-03: qty 10

## 2018-11-03 MED ORDER — SODIUM CHLORIDE 0.9 % IV SOLN
72.0000 mg/m2 | Freq: Once | INTRAVENOUS | Status: AC
  Administered 2018-11-03: 96 mg via INTRAVENOUS
  Filled 2018-11-03: qty 16

## 2018-11-03 MED ORDER — HEPARIN SOD (PORK) LOCK FLUSH 100 UNIT/ML IV SOLN
500.0000 [IU] | Freq: Once | INTRAVENOUS | Status: AC | PRN
  Administered 2018-11-03: 500 [IU]
  Filled 2018-11-03: qty 5

## 2018-11-03 MED ORDER — SODIUM CHLORIDE 0.9 % IV SOLN
Freq: Once | INTRAVENOUS | Status: AC
  Administered 2018-11-03: 10:00:00 via INTRAVENOUS
  Filled 2018-11-03: qty 250

## 2018-11-03 MED ORDER — PALONOSETRON HCL INJECTION 0.25 MG/5ML
0.2500 mg | Freq: Once | INTRAVENOUS | Status: AC
  Administered 2018-11-03: 0.25 mg via INTRAVENOUS
  Filled 2018-11-03: qty 5

## 2018-11-03 NOTE — Progress Notes (Signed)
Patient here for follow up. She is currently on amoxicillin for bronchitis. She states she feels better and is not wheezing or coughing a lot. PT had upset stomach a couple of days this week, but it has resolved; she believes it was due to antibiotic. Complains of being a little dizzy.

## 2018-11-04 ENCOUNTER — Other Ambulatory Visit: Payer: Self-pay

## 2018-11-04 ENCOUNTER — Inpatient Hospital Stay: Payer: Medicare Other | Attending: Oncology

## 2018-11-04 DIAGNOSIS — D701 Agranulocytosis secondary to cancer chemotherapy: Secondary | ICD-10-CM | POA: Insufficient documentation

## 2018-11-04 DIAGNOSIS — C786 Secondary malignant neoplasm of retroperitoneum and peritoneum: Secondary | ICD-10-CM | POA: Insufficient documentation

## 2018-11-04 DIAGNOSIS — C569 Malignant neoplasm of unspecified ovary: Secondary | ICD-10-CM | POA: Diagnosis present

## 2018-11-04 MED ORDER — PEGFILGRASTIM INJECTION 6 MG/0.6ML ~~LOC~~
6.0000 mg | PREFILLED_SYRINGE | Freq: Once | SUBCUTANEOUS | Status: AC
  Administered 2018-11-04: 6 mg via SUBCUTANEOUS

## 2018-11-16 ENCOUNTER — Other Ambulatory Visit: Payer: Self-pay

## 2018-11-17 ENCOUNTER — Other Ambulatory Visit: Payer: Self-pay

## 2018-11-17 ENCOUNTER — Inpatient Hospital Stay (HOSPITAL_BASED_OUTPATIENT_CLINIC_OR_DEPARTMENT_OTHER): Payer: Medicare Other | Admitting: Oncology

## 2018-11-17 ENCOUNTER — Inpatient Hospital Stay: Payer: Medicare Other

## 2018-11-17 ENCOUNTER — Inpatient Hospital Stay: Payer: Medicare Other | Attending: Oncology

## 2018-11-17 VITALS — BP 164/94 | HR 91 | Resp 18 | Wt 101.6 lb

## 2018-11-17 VITALS — Temp 97.7°F

## 2018-11-17 DIAGNOSIS — C569 Malignant neoplasm of unspecified ovary: Secondary | ICD-10-CM

## 2018-11-17 DIAGNOSIS — R21 Rash and other nonspecific skin eruption: Secondary | ICD-10-CM | POA: Insufficient documentation

## 2018-11-17 DIAGNOSIS — Z7951 Long term (current) use of inhaled steroids: Secondary | ICD-10-CM | POA: Diagnosis not present

## 2018-11-17 DIAGNOSIS — I10 Essential (primary) hypertension: Secondary | ICD-10-CM | POA: Diagnosis not present

## 2018-11-17 DIAGNOSIS — C786 Secondary malignant neoplasm of retroperitoneum and peritoneum: Secondary | ICD-10-CM | POA: Insufficient documentation

## 2018-11-17 DIAGNOSIS — D649 Anemia, unspecified: Secondary | ICD-10-CM

## 2018-11-17 DIAGNOSIS — Z79899 Other long term (current) drug therapy: Secondary | ICD-10-CM | POA: Diagnosis not present

## 2018-11-17 DIAGNOSIS — Z95828 Presence of other vascular implants and grafts: Secondary | ICD-10-CM

## 2018-11-17 DIAGNOSIS — Z5189 Encounter for other specified aftercare: Secondary | ICD-10-CM | POA: Diagnosis not present

## 2018-11-17 DIAGNOSIS — D72819 Decreased white blood cell count, unspecified: Secondary | ICD-10-CM | POA: Diagnosis not present

## 2018-11-17 DIAGNOSIS — Z90710 Acquired absence of both cervix and uterus: Secondary | ICD-10-CM | POA: Insufficient documentation

## 2018-11-17 DIAGNOSIS — Z5111 Encounter for antineoplastic chemotherapy: Secondary | ICD-10-CM | POA: Insufficient documentation

## 2018-11-17 LAB — CBC WITH DIFFERENTIAL/PLATELET
Abs Immature Granulocytes: 0.14 10*3/uL — ABNORMAL HIGH (ref 0.00–0.07)
Basophils Absolute: 0 10*3/uL (ref 0.0–0.1)
Basophils Relative: 0 %
Eosinophils Absolute: 0 10*3/uL (ref 0.0–0.5)
Eosinophils Relative: 0 %
HCT: 28.3 % — ABNORMAL LOW (ref 36.0–46.0)
Hemoglobin: 9.2 g/dL — ABNORMAL LOW (ref 12.0–15.0)
Immature Granulocytes: 1 %
Lymphocytes Relative: 7 %
Lymphs Abs: 1 10*3/uL (ref 0.7–4.0)
MCH: 33.5 pg (ref 26.0–34.0)
MCHC: 32.5 g/dL (ref 30.0–36.0)
MCV: 102.9 fL — ABNORMAL HIGH (ref 80.0–100.0)
Monocytes Absolute: 0.6 10*3/uL (ref 0.1–1.0)
Monocytes Relative: 4 %
Neutro Abs: 13.3 10*3/uL — ABNORMAL HIGH (ref 1.7–7.7)
Neutrophils Relative %: 88 %
Platelets: 209 10*3/uL (ref 150–400)
RBC: 2.75 MIL/uL — ABNORMAL LOW (ref 3.87–5.11)
RDW: 15.9 % — ABNORMAL HIGH (ref 11.5–15.5)
WBC: 15.1 10*3/uL — ABNORMAL HIGH (ref 4.0–10.5)
nRBC: 0 % (ref 0.0–0.2)

## 2018-11-17 LAB — COMPREHENSIVE METABOLIC PANEL
ALT: 14 U/L (ref 0–44)
AST: 22 U/L (ref 15–41)
Albumin: 3.9 g/dL (ref 3.5–5.0)
Alkaline Phosphatase: 147 U/L — ABNORMAL HIGH (ref 38–126)
Anion gap: 7 (ref 5–15)
BUN: 25 mg/dL — ABNORMAL HIGH (ref 8–23)
CO2: 24 mmol/L (ref 22–32)
Calcium: 8.5 mg/dL — ABNORMAL LOW (ref 8.9–10.3)
Chloride: 101 mmol/L (ref 98–111)
Creatinine, Ser: 0.77 mg/dL (ref 0.44–1.00)
GFR calc Af Amer: >60 mL/min (ref >60)
GFR calc non Af Amer: >60 mL/min (ref >60)
Glucose, Bld: 86 mg/dL (ref 70–99)
Potassium: 4.5 mmol/L (ref 3.5–5.1)
Sodium: 132 mmol/L — ABNORMAL LOW (ref 135–145)
Total Bilirubin: 0.4 mg/dL (ref 0.3–1.2)
Total Protein: 6.6 g/dL (ref 6.5–8.1)

## 2018-11-17 MED ORDER — DEXAMETHASONE SODIUM PHOSPHATE 10 MG/ML IJ SOLN
10.0000 mg | Freq: Once | INTRAMUSCULAR | Status: AC
  Administered 2018-11-17: 10 mg via INTRAVENOUS
  Filled 2018-11-17: qty 1

## 2018-11-17 MED ORDER — SODIUM CHLORIDE 0.9% FLUSH
10.0000 mL | Freq: Once | INTRAVENOUS | Status: AC
  Administered 2018-11-17: 10 mL via INTRAVENOUS
  Filled 2018-11-17: qty 10

## 2018-11-17 MED ORDER — SODIUM CHLORIDE 0.9 % IV SOLN
Freq: Once | INTRAVENOUS | Status: AC
  Administered 2018-11-17: 11:00:00 via INTRAVENOUS
  Filled 2018-11-17: qty 250

## 2018-11-17 MED ORDER — HEPARIN SOD (PORK) LOCK FLUSH 100 UNIT/ML IV SOLN
500.0000 [IU] | Freq: Once | INTRAVENOUS | Status: AC
  Administered 2018-11-17: 500 [IU] via INTRAVENOUS
  Filled 2018-11-17: qty 5

## 2018-11-17 MED ORDER — SODIUM CHLORIDE 0.9 % IV SOLN
72.0000 mg/m2 | Freq: Once | INTRAVENOUS | Status: AC
  Administered 2018-11-17: 96 mg via INTRAVENOUS
  Filled 2018-11-17: qty 16

## 2018-11-17 MED ORDER — FAMOTIDINE IN NACL 20-0.9 MG/50ML-% IV SOLN
20.0000 mg | Freq: Once | INTRAVENOUS | Status: AC
  Administered 2018-11-17: 20 mg via INTRAVENOUS
  Filled 2018-11-17: qty 50

## 2018-11-17 MED ORDER — DIPHENHYDRAMINE HCL 50 MG/ML IJ SOLN
25.0000 mg | Freq: Once | INTRAMUSCULAR | Status: AC
  Administered 2018-11-17: 25 mg via INTRAVENOUS
  Filled 2018-11-17: qty 1

## 2018-11-17 NOTE — Progress Notes (Signed)
[place]  Telephone:(336) [phone] Fax:(336) [phone]  ID: Tamara Mckinney OB: 1928/03/12  MR#: [id number]  ZDG#:[id number]  Patient Care Team: [name], MD as PCP - General (Family Medicine) [name], RN as Registered Nurse Herbert Pun, MD as Consulting Physician (General Surgery)  CHIEF COMPLAINT: Stage IIIC ovarian cancer.  INTERVAL HISTORY: Patient returns to clinic today for further evaluation and consideration of cycle 2, day 8 of carboplatinum and Taxol.  Taxol only today.  She is tolerating her treatments well without significant side effects.  She currently feels well and is asymptomatic. She has no neurologic complaints. She has a fair appetite and denies weight loss.  She denies any chest pain, shortness of breath, cough, or hemoptysis.  She has no nausea, vomiting, constipation, or diarrhea. She has no urinary complaints.  Patient offers no specific complaints today.  REVIEW OF SYSTEMS:   Review of Systems  Constitutional: Negative.  Negative for fever, malaise/fatigue and weight loss.  Respiratory: Negative.  Negative for cough and shortness of breath.   Cardiovascular: Negative.  Negative for chest pain and leg swelling.  Gastrointestinal: Negative.  Negative for abdominal pain, blood in stool, constipation, diarrhea, heartburn, melena, nausea and vomiting.  Genitourinary: Negative.  Negative for dysuria.  Musculoskeletal: Negative.  Negative for back pain and neck pain.  Skin: Negative.  Negative for rash.  Neurological: Negative.  Negative for dizziness, focal weakness and weakness.  Endo/Heme/Allergies: Does not bruise/bleed easily.  Psychiatric/Behavioral: Negative.  The patient is not nervous/anxious.     As per HPI. Otherwise, a complete review of systems is negative.  PAST MEDICAL HISTORY: Past Medical History:  Diagnosis Date  . Anemia   . Arthritis   . Asthma   . Cancer ([place])   . Dyspnea   . GERD  (gastroesophageal reflux disease)   . Hypertension     PAST SURGICAL HISTORY: Past Surgical History:  Procedure Laterality Date  . ABDOMINAL HYSTERECTOMY    . BREAST BIOPSY     x6  . BUNIONECTOMY Bilateral   . CATARACT EXTRACTION, BILATERAL    . FLEXIBLE SIGMOIDOSCOPY N/A 05/21/2017   Procedure: FLEXIBLE SIGMOIDOSCOPY;  Surgeon: Lin Landsman, MD;  Location: [facility] ENDOSCOPY;  Service: Gastroenterology;  Laterality: N/A;  . INCONTINENCE SURGERY    . PORTA CATH INSERTION N/A 06/16/2017   Procedure: PORTA CATH INSERTION;  Surgeon: Algernon Huxley, MD;  Location: [place] CV LAB;  Service: Cardiovascular;  Laterality: N/A;  . RECTAL SURGERY  04/2018  . REPAIR OF RECTAL PROLAPSE N/A 05/11/2017   Procedure: REPAIR OF RECTAL PROLAPSE;  Surgeon: Leonie Green, MD;  Location: [facility] ORS;  Service: General;  Laterality: N/A;  . TONSILLECTOMY    . VAGINA SURGERY     uncertain procedure performed    FAMILY HISTORY: Family History  Problem Relation Age of Onset  . Heart disease Mother   . Heart disease Father   . Heart disease Sister   . Heart attack Sister   . Ulcerative colitis Brother   . Lung cancer Brother   . Thyroid cancer Sister   . Asthma Sister   . Diabetes Sister   . Asthma Sister   . Pancreatic cancer Sister   . Dementia Sister   . Asthma Brother   . Heart disease Brother   . Asthma Brother   . Lung cancer Brother   . Asthma Brother   . Lung cancer Brother   . Lung cancer Brother   . Rheum arthritis Brother  ADVANCED DIRECTIVES (Y/N):  N  HEALTH MAINTENANCE: Social History   Tobacco Use  . Smoking status: Never Smoker  . Smokeless tobacco: Never Used  Substance Use Topics  . Alcohol use: No  . Drug use: No     Colonoscopy:  PAP:  Bone density:  Lipid panel:  No Known Allergies  Current Outpatient Medications  Medication Sig Dispense Refill  . cyclobenzaprine (FLEXERIL) 10 MG tablet Take 1 tablet (10 mg total) by mouth 3 (three) times  daily as needed for muscle spasms. 30 tablet 1  . esomeprazole (NEXIUM) 40 MG capsule Take 40 mg by mouth daily before breakfast.     . lidocaine-prilocaine (EMLA) cream Apply 1 application topically as needed. Apply small amount to port site at least 1 hour prior to it being accessed, cover with plastic wrap 30 g 1  . montelukast (SINGULAIR) 10 MG tablet Take 10 mg by mouth at bedtime.    . valsartan (DIOVAN) 160 MG tablet Start 1/2 tab a day.  Can increase to 1 tab after a few weeks if BP stays high    . albuterol (PROVENTIL HFA;VENTOLIN HFA) 108 (90 Base) MCG/ACT inhaler Inhale 1-2 puffs into the lungs every 6 (six) hours as needed for wheezing or shortness of breath.    . benzonatate (TESSALON) 100 MG capsule Take 1 capsule by mouth 3 (three) times daily.    . fluticasone furoate-vilanterol (BREO ELLIPTA) 100-25 MCG/INH AEPB Inhale 1 puff into the lungs as needed.     . furosemide (LASIX) 40 MG tablet Take 40 mg by mouth as needed.     Marland Kitchen ipratropium-albuterol (DUONEB) 0.5-2.5 (3) MG/3ML SOLN Take 3 mLs by nebulization every 6 (six) hours as needed. For wheezing/shortness of breath    . ondansetron (ZOFRAN) 8 MG tablet Take 1 tablet (8 mg total) by mouth 2 (two) times daily as needed for nausea or vomiting. (Patient not taking: Reported on 11/03/2018) 30 tablet 2  . polyethylene glycol (MIRALAX / GLYCOLAX) packet Take by mouth.     No current facility-administered medications for this visit.    Facility-Administered Medications Ordered in Other Visits  Medication Dose Route Frequency Provider Last Rate Last Dose  . sodium chloride flush (NS) 0.9 % injection 10 mL  10 mL Intravenous PRN Lloyd Huger, MD   10 mL at 03/10/18 1200    OBJECTIVE: Vitals:   11/17/18 1102  BP: (!) 164/94  Pulse: 91  Resp: 18     Body mass index is 21.99 kg/m.    ECOG FS:0 - Asymptomatic  General: Thin, no acute distress. Eyes: Pink conjunctiva, anicteric sclera. HEENT: Normocephalic, moist mucous  membranes. Lungs: Clear to auscultation bilaterally. Heart: Regular rate and rhythm. No rubs, murmurs, or gallops. Abdomen: Soft, nontender, nondistended. No organomegaly noted, normoactive bowel sounds. Musculoskeletal: No edema, cyanosis, or clubbing. Neuro: Alert, answering all questions appropriately. Cranial nerves grossly intact. Skin: No rashes or petechiae noted. Psych: Normal affect.  LAB RESULTS:  Lab Results  Component Value Date   NA 132 (L) 11/17/2018   K 4.5 11/17/2018   CL 101 11/17/2018   CO2 24 11/17/2018   GLUCOSE 86 11/17/2018   BUN 25 (H) 11/17/2018   CREATININE 0.77 11/17/2018   CALCIUM 8.5 (L) 11/17/2018   PROT 6.6 11/17/2018   ALBUMIN 3.9 11/17/2018   AST 22 11/17/2018   ALT 14 11/17/2018   ALKPHOS 147 (H) 11/17/2018   BILITOT 0.4 11/17/2018   GFRNONAA >60 11/17/2018   GFRAA >60 11/17/2018  Lab Results  Component Value Date   WBC 15.1 (H) 11/17/2018   NEUTROABS 13.3 (H) 11/17/2018   HGB 9.2 (L) 11/17/2018   HCT 28.3 (L) 11/17/2018   MCV 102.9 (H) 11/17/2018   PLT 209 11/17/2018     STUDIES: No results found.  ASSESSMENT: Stage IIIC ovarian cancer.  PLAN:    1. Stage IIIC ovarian cancer: Patient now has recurrent disease.  CT scan results from May 06, 2018 reviewed independently with multifocal peritoneal metastasis increasing in size and number.  Patient's CA-125 continues to trend down and is now within normal limits at 19.0.  Proceed with cycle 2, day 8 of treatment today which is Taxol only.  Return to clinic in 1 week for further evaluation and consideration of cycle 2, day 15.  Plan to do a total of 3 cycles.  2. Lupus anticoagulant: Patient was noted to have an elevated PTT as well as increased bleeding during her surgery for rectal prolapse. Continue to monitor closely and repeat lupus anticoagulant panel if patient has evidence of bleeding or requires additional procedures.   3. Anemia: Patient's hemoglobin has trended up  slightly to 9.2.  Previously, iron stores are within normal limits.  Continue to monitor closely.   4.  Nausea: Patient does not complain of this today.  Continue antiemetics as prescribed. 5.  Leukocytosis: Secondary to Neulasta, proceed with treatment as above.  Patient expressed understanding and was in agreement with this plan. She also understands that She can call clinic at any time with any questions, concerns, or complaints.   Cancer Staging Malignant neoplasm of ovary [facility]) Staging form: Ovary, Fallopian Tube, and Primary Peritoneal Carcinoma, AJCC 8th Edition - Clinical stage from 05/29/2017: Stage IIIC (cT3c, cN1b, cM0) - Signed by Lloyd Huger, MD on 05/29/2017   Lloyd Huger, MD   11/19/2018 7:09 AM

## 2018-11-17 NOTE — Progress Notes (Signed)
Patient denies any concerns today.  

## 2018-11-18 LAB — CA 125: Cancer Antigen (CA) 125: 19 U/mL (ref 0.0–38.1)

## 2018-11-23 ENCOUNTER — Other Ambulatory Visit: Payer: Self-pay

## 2018-11-24 ENCOUNTER — Inpatient Hospital Stay: Payer: Medicare Other

## 2018-11-24 ENCOUNTER — Encounter: Payer: Self-pay | Admitting: Oncology

## 2018-11-24 ENCOUNTER — Inpatient Hospital Stay (HOSPITAL_BASED_OUTPATIENT_CLINIC_OR_DEPARTMENT_OTHER): Payer: Medicare Other | Admitting: Oncology

## 2018-11-24 ENCOUNTER — Other Ambulatory Visit: Payer: Self-pay

## 2018-11-24 ENCOUNTER — Other Ambulatory Visit: Payer: Self-pay | Admitting: Oncology

## 2018-11-24 VITALS — BP 174/83 | HR 85 | Resp 18

## 2018-11-24 VITALS — HR 94 | Temp 97.4°F | Wt 100.0 lb

## 2018-11-24 DIAGNOSIS — C569 Malignant neoplasm of unspecified ovary: Secondary | ICD-10-CM

## 2018-11-24 DIAGNOSIS — Z90710 Acquired absence of both cervix and uterus: Secondary | ICD-10-CM

## 2018-11-24 DIAGNOSIS — I10 Essential (primary) hypertension: Secondary | ICD-10-CM | POA: Diagnosis not present

## 2018-11-24 DIAGNOSIS — D649 Anemia, unspecified: Secondary | ICD-10-CM

## 2018-11-24 DIAGNOSIS — Z5111 Encounter for antineoplastic chemotherapy: Secondary | ICD-10-CM | POA: Diagnosis not present

## 2018-11-24 DIAGNOSIS — D72819 Decreased white blood cell count, unspecified: Secondary | ICD-10-CM | POA: Diagnosis not present

## 2018-11-24 DIAGNOSIS — Z79899 Other long term (current) drug therapy: Secondary | ICD-10-CM

## 2018-11-24 DIAGNOSIS — R21 Rash and other nonspecific skin eruption: Secondary | ICD-10-CM

## 2018-11-24 DIAGNOSIS — C786 Secondary malignant neoplasm of retroperitoneum and peritoneum: Secondary | ICD-10-CM | POA: Diagnosis not present

## 2018-11-24 LAB — COMPREHENSIVE METABOLIC PANEL
ALT: 14 U/L (ref 0–44)
AST: 25 U/L (ref 15–41)
Albumin: 4.2 g/dL (ref 3.5–5.0)
Alkaline Phosphatase: 107 U/L (ref 38–126)
Anion gap: 7 (ref 5–15)
BUN: 30 mg/dL — ABNORMAL HIGH (ref 8–23)
CO2: 22 mmol/L (ref 22–32)
Calcium: 8.7 mg/dL — ABNORMAL LOW (ref 8.9–10.3)
Chloride: 106 mmol/L (ref 98–111)
Creatinine, Ser: 0.83 mg/dL (ref 0.44–1.00)
GFR calc Af Amer: >60 mL/min (ref >60)
GFR calc non Af Amer: >60 mL/min (ref >60)
Glucose, Bld: 116 mg/dL — ABNORMAL HIGH (ref 70–99)
Potassium: 4 mmol/L (ref 3.5–5.1)
Sodium: 135 mmol/L (ref 135–145)
Total Bilirubin: 0.6 mg/dL (ref 0.3–1.2)
Total Protein: 6.7 g/dL (ref 6.5–8.1)

## 2018-11-24 LAB — CBC WITH DIFFERENTIAL/PLATELET
Abs Immature Granulocytes: 0.03 10*3/uL (ref 0.00–0.07)
Basophils Absolute: 0 10*3/uL (ref 0.0–0.1)
Basophils Relative: 1 %
Eosinophils Absolute: 0 10*3/uL (ref 0.0–0.5)
Eosinophils Relative: 1 %
HCT: 28.1 % — ABNORMAL LOW (ref 36.0–46.0)
Hemoglobin: 9.2 g/dL — ABNORMAL LOW (ref 12.0–15.0)
Immature Granulocytes: 1 %
Lymphocytes Relative: 20 %
Lymphs Abs: 0.9 10*3/uL (ref 0.7–4.0)
MCH: 33.7 pg (ref 26.0–34.0)
MCHC: 32.7 g/dL (ref 30.0–36.0)
MCV: 102.9 fL — ABNORMAL HIGH (ref 80.0–100.0)
Monocytes Absolute: 0.3 10*3/uL (ref 0.1–1.0)
Monocytes Relative: 6 %
Neutro Abs: 3.2 10*3/uL (ref 1.7–7.7)
Neutrophils Relative %: 71 %
Platelets: 256 10*3/uL (ref 150–400)
RBC: 2.73 MIL/uL — ABNORMAL LOW (ref 3.87–5.11)
RDW: 15.9 % — ABNORMAL HIGH (ref 11.5–15.5)
WBC: 4.4 10*3/uL (ref 4.0–10.5)
nRBC: 0 % (ref 0.0–0.2)

## 2018-11-24 MED ORDER — SODIUM CHLORIDE 0.9% FLUSH
10.0000 mL | Freq: Once | INTRAVENOUS | Status: AC
  Administered 2018-11-24: 09:00:00 10 mL via INTRAVENOUS
  Filled 2018-11-24: qty 10

## 2018-11-24 MED ORDER — SODIUM CHLORIDE 0.9 % IV SOLN
72.0000 mg/m2 | Freq: Once | INTRAVENOUS | Status: AC
  Administered 2018-11-24: 11:00:00 96 mg via INTRAVENOUS
  Filled 2018-11-24: qty 16

## 2018-11-24 MED ORDER — DIPHENHYDRAMINE HCL 50 MG/ML IJ SOLN
25.0000 mg | Freq: Once | INTRAMUSCULAR | Status: AC
  Administered 2018-11-24: 25 mg via INTRAVENOUS
  Filled 2018-11-24: qty 1

## 2018-11-24 MED ORDER — SODIUM CHLORIDE 0.9 % IV SOLN
Freq: Once | INTRAVENOUS | Status: AC
  Administered 2018-11-24: 10:00:00 via INTRAVENOUS
  Filled 2018-11-24: qty 250

## 2018-11-24 MED ORDER — DEXAMETHASONE SODIUM PHOSPHATE 10 MG/ML IJ SOLN
10.0000 mg | Freq: Once | INTRAMUSCULAR | Status: AC
  Administered 2018-11-24: 10 mg via INTRAVENOUS
  Filled 2018-11-24: qty 1

## 2018-11-24 MED ORDER — PREDNISONE 10 MG PO TABS: 10.0000 mg | ORAL_TABLET | Freq: Every day | ORAL | 0 refills | Status: DC

## 2018-11-24 MED ORDER — FAMOTIDINE IN NACL 20-0.9 MG/50ML-% IV SOLN
20.0000 mg | Freq: Once | INTRAVENOUS | Status: AC
  Administered 2018-11-24: 20 mg via INTRAVENOUS
  Filled 2018-11-24: qty 50

## 2018-11-24 MED ORDER — HEPARIN SOD (PORK) LOCK FLUSH 100 UNIT/ML IV SOLN
500.0000 [IU] | Freq: Once | INTRAVENOUS | Status: AC
  Administered 2018-11-24: 500 [IU] via INTRAVENOUS
  Filled 2018-11-24: qty 5

## 2018-11-24 NOTE — Progress Notes (Signed)
[place]  Telephone:(336) [phone] Fax:(336) [phone]  ID: Tamara Mckinney OB: Aug 16, 1928  MR#: [id number]  FYB#:[id number]  Patient Care Team: [name], MD as PCP - General (Family Medicine) [name], RN as Registered Nurse Herbert Pun, MD as Consulting Physician (General Surgery)  CHIEF COMPLAINT: Stage IIIC ovarian cancer.  INTERVAL HISTORY: Patient returns to clinic today for further evaluation and consideration of cycle 2, day 15 of carboplatinum and Taxol.  Taxol only today.  She developed a rash on her upper torso that is mildly pruritic.  She otherwise feels well and is asymptomatic.  She continues to tolerate her treatments well without significant side effects.  She has no neurologic complaints. She has a fair appetite and denies weight loss.  She denies any chest pain, shortness of breath, cough, or hemoptysis.  She has no nausea, vomiting, constipation, or diarrhea. She has no urinary complaints.  Patient offers no further specific complaints today.  REVIEW OF SYSTEMS:   Review of Systems  Constitutional: Negative.  Negative for fever, malaise/fatigue and weight loss.  Respiratory: Negative.  Negative for cough and shortness of breath.   Cardiovascular: Negative.  Negative for chest pain and leg swelling.  Gastrointestinal: Negative.  Negative for abdominal pain, blood in stool, constipation, diarrhea, heartburn, melena, nausea and vomiting.  Genitourinary: Negative.  Negative for dysuria.  Musculoskeletal: Negative.  Negative for back pain and neck pain.  Skin: Positive for itching and rash.  Neurological: Negative.  Negative for dizziness, focal weakness and weakness.  Endo/Heme/Allergies: Does not bruise/bleed easily.  Psychiatric/Behavioral: Negative.  The patient is not nervous/anxious.     As per HPI. Otherwise, a complete review of systems is negative.  PAST MEDICAL HISTORY: Past Medical History:  Diagnosis Date  .  Anemia   . Arthritis   . Asthma   . Cancer ([place])   . Dyspnea   . GERD (gastroesophageal reflux disease)   . Hypertension     PAST SURGICAL HISTORY: Past Surgical History:  Procedure Laterality Date  . ABDOMINAL HYSTERECTOMY    . BREAST BIOPSY     x6  . BUNIONECTOMY Bilateral   . CATARACT EXTRACTION, BILATERAL    . FLEXIBLE SIGMOIDOSCOPY N/A 05/21/2017   Procedure: FLEXIBLE SIGMOIDOSCOPY;  Surgeon: Lin Landsman, MD;  Location: [facility] ENDOSCOPY;  Service: Gastroenterology;  Laterality: N/A;  . INCONTINENCE SURGERY    . PORTA CATH INSERTION N/A 06/16/2017   Procedure: PORTA CATH INSERTION;  Surgeon: Algernon Huxley, MD;  Location: [place] CV LAB;  Service: Cardiovascular;  Laterality: N/A;  . RECTAL SURGERY  04/2018  . REPAIR OF RECTAL PROLAPSE N/A 05/11/2017   Procedure: REPAIR OF RECTAL PROLAPSE;  Surgeon: Leonie Green, MD;  Location: [facility] ORS;  Service: General;  Laterality: N/A;  . TONSILLECTOMY    . VAGINA SURGERY     uncertain procedure performed    FAMILY HISTORY: Family History  Problem Relation Age of Onset  . Heart disease Mother   . Heart disease Father   . Heart disease Sister   . Heart attack Sister   . Ulcerative colitis Brother   . Lung cancer Brother   . Thyroid cancer Sister   . Asthma Sister   . Diabetes Sister   . Asthma Sister   . Pancreatic cancer Sister   . Dementia Sister   . Asthma Brother   . Heart disease Brother   . Asthma Brother   . Lung cancer Brother   . Asthma Brother   .  Lung cancer Brother   . Lung cancer Brother   . Rheum arthritis Brother     ADVANCED DIRECTIVES (Y/N):  N  HEALTH MAINTENANCE: Social History   Tobacco Use  . Smoking status: Never Smoker  . Smokeless tobacco: Never Used  Substance Use Topics  . Alcohol use: No  . Drug use: No     Colonoscopy:  PAP:  Bone density:  Lipid panel:  No Known Allergies  Current Outpatient Medications  Medication Sig Dispense Refill  . albuterol  (PROVENTIL HFA;VENTOLIN HFA) 108 (90 Base) MCG/ACT inhaler Inhale 1-2 puffs into the lungs every 6 (six) hours as needed for wheezing or shortness of breath.    . benzonatate (TESSALON) 100 MG capsule Take 1 capsule by mouth 3 (three) times daily.    . cyclobenzaprine (FLEXERIL) 10 MG tablet Take 1 tablet (10 mg total) by mouth 3 (three) times daily as needed for muscle spasms. 30 tablet 1  . diphenhydrAMINE (BENADRYL) 25 MG tablet Take 25 mg by mouth every 6 (six) hours as needed.    Marland Kitchen esomeprazole (NEXIUM) 40 MG capsule Take 40 mg by mouth daily before breakfast.     . fluticasone furoate-vilanterol (BREO ELLIPTA) 100-25 MCG/INH AEPB Inhale 1 puff into the lungs as needed.     . furosemide (LASIX) 40 MG tablet Take 40 mg by mouth as needed.     Marland Kitchen ipratropium-albuterol (DUONEB) 0.5-2.5 (3) MG/3ML SOLN Take 3 mLs by nebulization every 6 (six) hours as needed. For wheezing/shortness of breath    . lidocaine-prilocaine (EMLA) cream Apply 1 application topically as needed. Apply small amount to port site at least 1 hour prior to it being accessed, cover with plastic wrap 30 g 1  . montelukast (SINGULAIR) 10 MG tablet Take 10 mg by mouth at bedtime.    . ondansetron (ZOFRAN) 8 MG tablet Take 1 tablet (8 mg total) by mouth 2 (two) times daily as needed for nausea or vomiting. 30 tablet 2  . polyethylene glycol (MIRALAX / GLYCOLAX) packet Take by mouth.    . valsartan (DIOVAN) 160 MG tablet Start 1/2 tab a day.  Can increase to 1 tab after a few weeks if BP stays high    . predniSONE (DELTASONE) 10 MG tablet Take 1 tablet (10 mg total) by mouth daily with breakfast. 5 tablet 0   No current facility-administered medications for this visit.    Facility-Administered Medications Ordered in Other Visits  Medication Dose Route Frequency Provider Last Rate Last Dose  . 0.9 %  sodium chloride infusion   Intravenous Once Lloyd Huger, MD      . dexamethasone (DECADRON) injection 10 mg  10 mg Intravenous  Once Lloyd Huger, MD      . diphenhydrAMINE (BENADRYL) injection 25 mg  25 mg Intravenous Once Lloyd Huger, MD      . famotidine (PEPCID) IVPB 20 mg premix  20 mg Intravenous Once Lloyd Huger, MD      . heparin lock flush 100 unit/mL  500 Units Intravenous Once Lloyd Huger, MD      . PACLitaxel (TAXOL) 96 mg in sodium chloride 0.9 % 250 mL chemo infusion (</= 80mg /m2)  72 mg/m2 (Order-Specific) Intravenous Once Lloyd Huger, MD      . sodium chloride flush (NS) 0.9 % injection 10 mL  10 mL Intravenous PRN Lloyd Huger, MD   10 mL at 03/10/18 1200    OBJECTIVE: Vitals:   11/24/18 0941  Pulse:  94  Temp: (!) 97.4 F (36.3 C)     Body mass index is 21.64 kg/m.    ECOG FS:0 - Asymptomatic  General: Thin, no acute distress. Eyes: Pink conjunctiva, anicteric sclera. HEENT: Normocephalic, moist mucous membranes. Lungs: Clear to auscultation bilaterally. Heart: Regular rate and rhythm. No rubs, murmurs, or gallops. Abdomen: Soft, nontender, nondistended. No organomegaly noted, normoactive bowel sounds. Musculoskeletal: Macular rash on upper torso. Neuro: Alert, answering all questions appropriately. Cranial nerves grossly intact. Skin: No rashes or petechiae noted. Psych: Normal affect.  LAB RESULTS:  Lab Results  Component Value Date   NA 135 11/24/2018   K 4.0 11/24/2018   CL 106 11/24/2018   CO2 22 11/24/2018   GLUCOSE 116 (H) 11/24/2018   BUN 30 (H) 11/24/2018   CREATININE 0.83 11/24/2018   CALCIUM 8.7 (L) 11/24/2018   PROT 6.7 11/24/2018   ALBUMIN 4.2 11/24/2018   AST 25 11/24/2018   ALT 14 11/24/2018   ALKPHOS 107 11/24/2018   BILITOT 0.6 11/24/2018   GFRNONAA >60 11/24/2018   GFRAA >60 11/24/2018    Lab Results  Component Value Date   WBC 4.4 11/24/2018   NEUTROABS 3.2 11/24/2018   HGB 9.2 (L) 11/24/2018   HCT 28.1 (L) 11/24/2018   MCV 102.9 (H) 11/24/2018   PLT 256 11/24/2018     STUDIES: No results found.   ASSESSMENT: Stage IIIC ovarian cancer.  PLAN:    1. Stage IIIC ovarian cancer: Patient now has recurrent disease.  CT scan results from May 06, 2018 reviewed independently with multifocal peritoneal metastasis increasing in size and number.  Patient's CA-125 continues to trend down and is now within normal limits at 19.0.  Proceed with cycle 2, day 15 of treatment today which is Taxol only.  Return to clinic in 1 week for further evaluation and consideration of cycle 3, day 1 which is carboplatinum and Taxol.  Patient will require Neulasta with day 1 of each treatment. Plan to do a total of 3 cycles.  2. Lupus anticoagulant: Patient was noted to have an elevated PTT as well as increased bleeding during her surgery for rectal prolapse. Continue to monitor closely and repeat lupus anticoagulant panel if patient has evidence of bleeding or requires additional procedures.   3. Anemia: Chronic and unchanged.  Patient's hemoglobin is 9.2.  Previously, iron stores are within normal limits.  Continue to monitor closely.   4.  Nausea: Patient does not complain of this today.  Continue antiemetics as prescribed. 5.  Leukocytosis: Resolved. 6.  Rash: Unclear etiology.  Patient was given a prescription for prednisone 10 mg daily x5 days.  Continue topical treatments as needed.  Patient expressed understanding and was in agreement with this plan. She also understands that She can call clinic at any time with any questions, concerns, or complaints.   Cancer Staging Malignant neoplasm of ovary [facility]) Staging form: Ovary, Fallopian Tube, and Primary Peritoneal Carcinoma, AJCC 8th Edition - Clinical stage from 05/29/2017: Stage IIIC (cT3c, cN1b, cM0) - Signed by Lloyd Huger, MD on 05/29/2017   Lloyd Huger, MD   11/24/2018 10:27 AM

## 2018-11-24 NOTE — Progress Notes (Signed)
Patient stated that she has had a rash on neck and chest for a week. Patient has taken Benadryl and applied antifungal cream on the area and she is still itchy.

## 2018-11-24 NOTE — Patient Instructions (Signed)
Prednisone was prescribed at today's visit to help with rash and itching. It will be one tablet (10 mg) once daily for 5 days. All other medications and recommendations remain the same after today's visit. Appointments for next week scheduled as well. If you ever have any questions or concerns from visits please feel free to call our direct number at [phone].

## 2018-11-25 LAB — CA 125: Cancer Antigen (CA) 125: 16.1 U/mL (ref 0.0–38.1)

## 2018-11-30 ENCOUNTER — Other Ambulatory Visit: Payer: Self-pay

## 2018-12-01 ENCOUNTER — Inpatient Hospital Stay (HOSPITAL_BASED_OUTPATIENT_CLINIC_OR_DEPARTMENT_OTHER): Payer: Medicare Other | Admitting: Oncology

## 2018-12-01 ENCOUNTER — Inpatient Hospital Stay: Payer: Medicare Other

## 2018-12-01 ENCOUNTER — Encounter: Payer: Self-pay | Admitting: Oncology

## 2018-12-01 ENCOUNTER — Other Ambulatory Visit: Payer: Self-pay

## 2018-12-01 VITALS — BP 144/76 | HR 94 | Temp 97.8°F | Wt 99.8 lb

## 2018-12-01 DIAGNOSIS — C786 Secondary malignant neoplasm of retroperitoneum and peritoneum: Secondary | ICD-10-CM

## 2018-12-01 DIAGNOSIS — Z90710 Acquired absence of both cervix and uterus: Secondary | ICD-10-CM

## 2018-12-01 DIAGNOSIS — Z5111 Encounter for antineoplastic chemotherapy: Secondary | ICD-10-CM | POA: Diagnosis not present

## 2018-12-01 DIAGNOSIS — D72819 Decreased white blood cell count, unspecified: Secondary | ICD-10-CM | POA: Diagnosis not present

## 2018-12-01 DIAGNOSIS — C569 Malignant neoplasm of unspecified ovary: Secondary | ICD-10-CM

## 2018-12-01 DIAGNOSIS — I10 Essential (primary) hypertension: Secondary | ICD-10-CM

## 2018-12-01 DIAGNOSIS — R21 Rash and other nonspecific skin eruption: Secondary | ICD-10-CM

## 2018-12-01 DIAGNOSIS — D649 Anemia, unspecified: Secondary | ICD-10-CM

## 2018-12-01 DIAGNOSIS — Z79899 Other long term (current) drug therapy: Secondary | ICD-10-CM

## 2018-12-01 LAB — COMPREHENSIVE METABOLIC PANEL
ALT: 15 U/L (ref 0–44)
AST: 23 U/L (ref 15–41)
Albumin: 4 g/dL (ref 3.5–5.0)
Alkaline Phosphatase: 87 U/L (ref 38–126)
Anion gap: 6 (ref 5–15)
BUN: 33 mg/dL — ABNORMAL HIGH (ref 8–23)
CO2: 24 mmol/L (ref 22–32)
Calcium: 8.6 mg/dL — ABNORMAL LOW (ref 8.9–10.3)
Chloride: 102 mmol/L (ref 98–111)
Creatinine, Ser: 1 mg/dL (ref 0.44–1.00)
GFR calc Af Amer: 58 mL/min — ABNORMAL LOW (ref >60)
GFR calc non Af Amer: 50 mL/min — ABNORMAL LOW (ref >60)
Glucose, Bld: 102 mg/dL — ABNORMAL HIGH (ref 70–99)
Potassium: 4.7 mmol/L (ref 3.5–5.1)
Sodium: 132 mmol/L — ABNORMAL LOW (ref 135–145)
Total Bilirubin: 0.6 mg/dL (ref 0.3–1.2)
Total Protein: 6.7 g/dL (ref 6.5–8.1)

## 2018-12-01 LAB — CBC WITH DIFFERENTIAL/PLATELET
Abs Immature Granulocytes: 0.02 10*3/uL (ref 0.00–0.07)
Basophils Absolute: 0 10*3/uL (ref 0.0–0.1)
Basophils Relative: 1 %
Eosinophils Absolute: 0 10*3/uL (ref 0.0–0.5)
Eosinophils Relative: 0 %
HCT: 29.3 % — ABNORMAL LOW (ref 36.0–46.0)
Hemoglobin: 9.4 g/dL — ABNORMAL LOW (ref 12.0–15.0)
Immature Granulocytes: 1 %
Lymphocytes Relative: 30 %
Lymphs Abs: 0.9 10*3/uL (ref 0.7–4.0)
MCH: 33.1 pg (ref 26.0–34.0)
MCHC: 32.1 g/dL (ref 30.0–36.0)
MCV: 103.2 fL — ABNORMAL HIGH (ref 80.0–100.0)
Monocytes Absolute: 0.4 10*3/uL (ref 0.1–1.0)
Monocytes Relative: 13 %
Neutro Abs: 1.6 10*3/uL — ABNORMAL LOW (ref 1.7–7.7)
Neutrophils Relative %: 55 %
Platelets: 297 10*3/uL (ref 150–400)
RBC: 2.84 MIL/uL — ABNORMAL LOW (ref 3.87–5.11)
RDW: 16 % — ABNORMAL HIGH (ref 11.5–15.5)
WBC: 2.9 10*3/uL — ABNORMAL LOW (ref 4.0–10.5)
nRBC: 0 % (ref 0.0–0.2)

## 2018-12-01 MED ORDER — DEXAMETHASONE SODIUM PHOSPHATE 10 MG/ML IJ SOLN
10.0000 mg | Freq: Once | INTRAMUSCULAR | Status: AC
  Administered 2018-12-01: 10 mg via INTRAVENOUS
  Filled 2018-12-01: qty 1

## 2018-12-01 MED ORDER — HEPARIN SOD (PORK) LOCK FLUSH 100 UNIT/ML IV SOLN
500.0000 [IU] | Freq: Once | INTRAVENOUS | Status: AC
  Administered 2018-12-01: 500 [IU] via INTRAVENOUS
  Filled 2018-12-01: qty 5

## 2018-12-01 MED ORDER — PALONOSETRON HCL INJECTION 0.25 MG/5ML
0.2500 mg | Freq: Once | INTRAVENOUS | Status: AC
  Administered 2018-12-01: 0.25 mg via INTRAVENOUS
  Filled 2018-12-01: qty 5

## 2018-12-01 MED ORDER — PEGFILGRASTIM 6 MG/0.6ML ~~LOC~~ PSKT
6.0000 mg | PREFILLED_SYRINGE | Freq: Once | SUBCUTANEOUS | Status: AC
  Administered 2018-12-01: 6 mg via SUBCUTANEOUS
  Filled 2018-12-01: qty 0.6

## 2018-12-01 MED ORDER — SODIUM CHLORIDE 0.9 % IV SOLN
Freq: Once | INTRAVENOUS | Status: AC
  Administered 2018-12-01: 11:00:00 via INTRAVENOUS
  Filled 2018-12-01: qty 250

## 2018-12-01 MED ORDER — FAMOTIDINE IN NACL 20-0.9 MG/50ML-% IV SOLN
20.0000 mg | Freq: Once | INTRAVENOUS | Status: AC
  Administered 2018-12-01: 20 mg via INTRAVENOUS
  Filled 2018-12-01: qty 50

## 2018-12-01 MED ORDER — SODIUM CHLORIDE 0.9 % IV SOLN
209.2000 mg | Freq: Once | INTRAVENOUS | Status: AC
  Administered 2018-12-01: 210 mg via INTRAVENOUS
  Filled 2018-12-01: qty 21

## 2018-12-01 MED ORDER — SODIUM CHLORIDE 0.9 % IV SOLN
72.0000 mg/m2 | Freq: Once | INTRAVENOUS | Status: AC
  Administered 2018-12-01: 96 mg via INTRAVENOUS
  Filled 2018-12-01: qty 16

## 2018-12-01 MED ORDER — DIPHENHYDRAMINE HCL 50 MG/ML IJ SOLN
25.0000 mg | Freq: Once | INTRAMUSCULAR | Status: AC
  Administered 2018-12-01: 25 mg via INTRAVENOUS
  Filled 2018-12-01: qty 1

## 2018-12-01 MED ORDER — SODIUM CHLORIDE 0.9% FLUSH
10.0000 mL | Freq: Once | INTRAVENOUS | Status: AC
  Administered 2018-12-01: 10 mL via INTRAVENOUS
  Filled 2018-12-01: qty 10

## 2018-12-01 MED ORDER — HEPARIN SOD (PORK) LOCK FLUSH 100 UNIT/ML IV SOLN: 500.0000 [IU] | Freq: Once | INTRAVENOUS | Status: DC | PRN

## 2018-12-01 NOTE — Progress Notes (Signed)
Patient stated that she continues to have the rash on her upper chest and neck. Patient stated that the OTC hydrocortisone cream had helped but she still itches.

## 2018-12-01 NOTE — Progress Notes (Signed)
[place]  Telephone:(336) [phone] Fax:(336) [phone]  ID: Tamara Mckinney OB: 03-12-1928  MR#: [id number]  YIA#:[id number]  Patient Care Team: [name], MD as PCP - General (Family Medicine) [name], RN as Registered Nurse Herbert Pun, MD as Consulting Physician (General Surgery)  CHIEF COMPLAINT: Stage IIIC ovarian cancer.  INTERVAL HISTORY: Patient returns to clinic today for further evaluation and consideration of cycle 3, day 1 of carboplatinum and Taxol.  Her rash has significantly improved after taking prednisone for 5 days.  She currently feels well and is asymptomatic. She continues to tolerate her treatments well without significant side effects.  She has no neurologic complaints. She has a fair appetite and denies weight loss.  She denies any chest pain, shortness of breath, cough, or hemoptysis.  She has no nausea, vomiting, constipation, or diarrhea. She has no urinary complaints.  Patient offers no specific complaints today.  REVIEW OF SYSTEMS:   Review of Systems  Constitutional: Negative.  Negative for fever, malaise/fatigue and weight loss.  Respiratory: Negative.  Negative for cough and shortness of breath.   Cardiovascular: Negative.  Negative for chest pain and leg swelling.  Gastrointestinal: Negative.  Negative for abdominal pain, blood in stool, constipation, diarrhea, heartburn, melena, nausea and vomiting.  Genitourinary: Negative.  Negative for dysuria.  Musculoskeletal: Negative.  Negative for back pain and neck pain.  Skin: Negative.  Negative for itching and rash.  Neurological: Negative.  Negative for dizziness, focal weakness and weakness.  Endo/Heme/Allergies: Does not bruise/bleed easily.  Psychiatric/Behavioral: Negative.  The patient is not nervous/anxious.     As per HPI. Otherwise, a complete review of systems is negative.  PAST MEDICAL HISTORY: Past Medical History:  Diagnosis Date  . Anemia   .  Arthritis   . Asthma   . Cancer ([place])   . Dyspnea   . GERD (gastroesophageal reflux disease)   . Hypertension     PAST SURGICAL HISTORY: Past Surgical History:  Procedure Laterality Date  . ABDOMINAL HYSTERECTOMY    . BREAST BIOPSY     x6  . BUNIONECTOMY Bilateral   . CATARACT EXTRACTION, BILATERAL    . FLEXIBLE SIGMOIDOSCOPY N/A 05/21/2017   Procedure: FLEXIBLE SIGMOIDOSCOPY;  Surgeon: Lin Landsman, MD;  Location: [facility] ENDOSCOPY;  Service: Gastroenterology;  Laterality: N/A;  . INCONTINENCE SURGERY    . PORTA CATH INSERTION N/A 06/16/2017   Procedure: PORTA CATH INSERTION;  Surgeon: Algernon Huxley, MD;  Location: [place] CV LAB;  Service: Cardiovascular;  Laterality: N/A;  . RECTAL SURGERY  04/2018  . REPAIR OF RECTAL PROLAPSE N/A 05/11/2017   Procedure: REPAIR OF RECTAL PROLAPSE;  Surgeon: Leonie Green, MD;  Location: [facility] ORS;  Service: General;  Laterality: N/A;  . TONSILLECTOMY    . VAGINA SURGERY     uncertain procedure performed    FAMILY HISTORY: Family History  Problem Relation Age of Onset  . Heart disease Mother   . Heart disease Father   . Heart disease Sister   . Heart attack Sister   . Ulcerative colitis Brother   . Lung cancer Brother   . Thyroid cancer Sister   . Asthma Sister   . Diabetes Sister   . Asthma Sister   . Pancreatic cancer Sister   . Dementia Sister   . Asthma Brother   . Heart disease Brother   . Asthma Brother   . Lung cancer Brother   . Asthma Brother   . Lung cancer Brother   .  Lung cancer Brother   . Rheum arthritis Brother     ADVANCED DIRECTIVES (Y/N):  N  HEALTH MAINTENANCE: Social History   Tobacco Use  . Smoking status: Never Smoker  . Smokeless tobacco: Never Used  Substance Use Topics  . Alcohol use: No  . Drug use: No     Colonoscopy:  PAP:  Bone density:  Lipid panel:  No Known Allergies  Current Outpatient Medications  Medication Sig Dispense Refill  . albuterol (PROVENTIL  HFA;VENTOLIN HFA) 108 (90 Base) MCG/ACT inhaler Inhale 1-2 puffs into the lungs every 6 (six) hours as needed for wheezing or shortness of breath.    . benzonatate (TESSALON) 100 MG capsule Take 1 capsule by mouth 3 (three) times daily.    . cyclobenzaprine (FLEXERIL) 10 MG tablet Take 1 tablet (10 mg total) by mouth 3 (three) times daily as needed for muscle spasms. 30 tablet 1  . diphenhydrAMINE (BENADRYL) 25 MG tablet Take 25 mg by mouth every 6 (six) hours as needed.    Marland Kitchen esomeprazole (NEXIUM) 40 MG capsule Take 40 mg by mouth daily before breakfast.     . fluticasone furoate-vilanterol (BREO ELLIPTA) 100-25 MCG/INH AEPB Inhale 1 puff into the lungs as needed.     . furosemide (LASIX) 40 MG tablet Take 40 mg by mouth as needed.     Marland Kitchen ipratropium-albuterol (DUONEB) 0.5-2.5 (3) MG/3ML SOLN Take 3 mLs by nebulization every 6 (six) hours as needed. For wheezing/shortness of breath    . lidocaine-prilocaine (EMLA) cream Apply 1 application topically as needed. Apply small amount to port site at least 1 hour prior to it being accessed, cover with plastic wrap 30 g 1  . montelukast (SINGULAIR) 10 MG tablet Take 10 mg by mouth at bedtime.    . ondansetron (ZOFRAN) 8 MG tablet Take 1 tablet (8 mg total) by mouth 2 (two) times daily as needed for nausea or vomiting. 30 tablet 2  . polyethylene glycol (MIRALAX / GLYCOLAX) packet Take by mouth.    . predniSONE (DELTASONE) 10 MG tablet Take 1 tablet (10 mg total) by mouth daily with breakfast. 5 tablet 0  . valsartan (DIOVAN) 160 MG tablet Start 1/2 tab a day.  Can increase to 1 tab after a few weeks if BP stays high     No current facility-administered medications for this visit.    Facility-Administered Medications Ordered in Other Visits  Medication Dose Route Frequency Provider Last Rate Last Dose  . sodium chloride flush (NS) 0.9 % injection 10 mL  10 mL Intravenous PRN Lloyd Huger, MD   10 mL at 03/10/18 1200    OBJECTIVE: Vitals:    12/01/18 1017  BP: (!) 144/76  Pulse: 94  Temp: 97.8 F (36.6 C)     Body mass index is 21.6 kg/m.    ECOG FS:0 - Asymptomatic  General: Thin, no acute distress. Eyes: Pink conjunctiva, anicteric sclera. HEENT: Normocephalic, moist mucous membranes. Lungs: Clear to auscultation bilaterally. Heart: Regular rate and rhythm. No rubs, murmurs, or gallops. Abdomen: Soft, nontender, nondistended. No organomegaly noted, normoactive bowel sounds. Musculoskeletal: No edema, cyanosis, or clubbing. Neuro: Alert, answering all questions appropriately. Cranial nerves grossly intact. Skin: No rashes or petechiae noted. Psych: Normal affect.  LAB RESULTS:  Lab Results  Component Value Date   NA 132 (L) 12/01/2018   K 4.7 12/01/2018   CL 102 12/01/2018   CO2 24 12/01/2018   GLUCOSE 102 (H) 12/01/2018   BUN 33 (H) 12/01/2018  CREATININE 1.00 12/01/2018   CALCIUM 8.6 (L) 12/01/2018   PROT 6.7 12/01/2018   ALBUMIN 4.0 12/01/2018   AST 23 12/01/2018   ALT 15 12/01/2018   ALKPHOS 87 12/01/2018   BILITOT 0.6 12/01/2018   GFRNONAA 50 (L) 12/01/2018   GFRAA 58 (L) 12/01/2018    Lab Results  Component Value Date   WBC 2.9 (L) 12/01/2018   NEUTROABS 1.6 (L) 12/01/2018   HGB 9.4 (L) 12/01/2018   HCT 29.3 (L) 12/01/2018   MCV 103.2 (H) 12/01/2018   PLT 297 12/01/2018     STUDIES: No results found.  ASSESSMENT: Stage IIIC ovarian cancer.  PLAN:    1. Stage IIIC ovarian cancer: Patient now has recurrent disease.  CT scan results from May 06, 2018 reviewed independently with multifocal peritoneal metastasis increasing in size and number.  Patient's CA-125 continues to trend down and is now 13.1.  Proceed with cycle 3, day 1 of carboplatinum and Taxol today.  Patient will receive On-Pro Neulasta as well.  Return to clinic in 2 weeks for further evaluation and consideration of cycle 3, day 8.  Plan to do a total of 3 cycles. 2. Lupus anticoagulant: Patient was noted to have an  elevated PTT as well as increased bleeding during her surgery for rectal prolapse. Continue to monitor closely and repeat lupus anticoagulant panel if patient has evidence of bleeding or requires additional procedures.   3. Anemia: Chronic and unchanged.  Patient's hemoglobin is 9.4 today.  Previously, iron stores are within normal limits.  Continue to monitor closely.   4.  Nausea: Patient does not complain of this today.  Continue antiemetics as prescribed. 5.  Leukopenia: Proceed cautiously with treatment as above.  Neulasta as above. 6.  Rash: Unclear etiology.  Resolved with prednisone.  Monitor.  Patient expressed understanding and was in agreement with this plan. She also understands that She can call clinic at any time with any questions, concerns, or complaints.   Cancer Staging Malignant neoplasm of ovary [facility]) Staging form: Ovary, Fallopian Tube, and Primary Peritoneal Carcinoma, AJCC 8th Edition - Clinical stage from 05/29/2017: Stage IIIC (cT3c, cN1b, cM0) - Signed by Lloyd Huger, MD on 05/29/2017   Lloyd Huger, MD   12/02/2018 6:41 AM

## 2018-12-02 ENCOUNTER — Other Ambulatory Visit: Payer: Self-pay | Admitting: *Deleted

## 2018-12-02 ENCOUNTER — Telehealth: Payer: Self-pay | Admitting: *Deleted

## 2018-12-02 LAB — CA 125: Cancer Antigen (CA) 125: 13.1 U/mL (ref 0.0–38.1)

## 2018-12-02 MED ORDER — PREDNISONE 10 MG (21) PO TBPK: ORAL_TABLET | ORAL | 0 refills | Status: DC

## 2018-12-02 NOTE — Telephone Encounter (Signed)
Patients son called in to report she is broke out again with rash and itching. He has given her a dose of benadryl this morning. She completed her prednisone that was prescribed last week. Please advise.

## 2018-12-02 NOTE — Telephone Encounter (Signed)
VO from Dr. Grayland Ormond for Medrol Dose Pack. Patients family is aware, prescription sent to [facility].

## 2018-12-13 NOTE — Progress Notes (Signed)
[name]  [phone]) [phone] Fax:(336) [phone]  ID: Tamara Mckinney OB: 1928-04-23  MR#: [id number]  KVQ#:[id number]  Patient Care Team: [name], MD as PCP - General (Family Medicine) [name], RN as Registered Nurse Herbert Pun, MD as Consulting Physician (General Surgery)  CHIEF COMPLAINT: Stage IIIC ovarian cancer.  INTERVAL HISTORY: Patient returns to clinic today for further evaluation and consideration of cycle 3, day 8 of carboplatinum and Taxol.  Taxol only today.  Her rash is now resolved.  She currently feels well and is asymptomatic. She continues to tolerate her treatments well without significant side effects.  She has no neurologic complaints. She has a good appetite and has gained weight in the interim while on prednisone.  She denies any chest pain, shortness of breath, cough, or hemoptysis.  She has no nausea, vomiting, constipation, or diarrhea. She has no urinary complaints.  Patient offers no specific complaints today.  REVIEW OF SYSTEMS:   Review of Systems  Constitutional: Negative.  Negative for fever, malaise/fatigue and weight loss.  Respiratory: Negative.  Negative for cough and shortness of breath.   Cardiovascular: Negative.  Negative for chest pain and leg swelling.  Gastrointestinal: Negative.  Negative for abdominal pain, blood in stool, constipation, diarrhea, heartburn, melena, nausea and vomiting.  Genitourinary: Negative.  Negative for dysuria.  Musculoskeletal: Negative.  Negative for back pain and neck pain.  Skin: Negative.  Negative for itching and rash.  Neurological: Negative.  Negative for dizziness, focal weakness and weakness.  Endo/Heme/Allergies: Does not bruise/bleed easily.  Psychiatric/Behavioral: Negative.  The patient is not nervous/anxious.     As per HPI. Otherwise, a complete review of systems is negative.  PAST MEDICAL HISTORY: Past Medical History:  Diagnosis Date  . Anemia    . Arthritis   . Asthma   . Cancer ([place])   . Dyspnea   . GERD (gastroesophageal reflux disease)   . Hypertension     PAST SURGICAL HISTORY: Past Surgical History:  Procedure Laterality Date  . ABDOMINAL HYSTERECTOMY    . BREAST BIOPSY     x6  . BUNIONECTOMY Bilateral   . CATARACT EXTRACTION, BILATERAL    . FLEXIBLE SIGMOIDOSCOPY N/A 05/21/2017   Procedure: FLEXIBLE SIGMOIDOSCOPY;  Surgeon: Lin Landsman, MD;  Location: [facility] ENDOSCOPY;  Service: Gastroenterology;  Laterality: N/A;  . INCONTINENCE SURGERY    . PORTA CATH INSERTION N/A 06/16/2017   Procedure: PORTA CATH INSERTION;  Surgeon: Algernon Huxley, MD;  Location: [place] CV LAB;  Service: Cardiovascular;  Laterality: N/A;  . RECTAL SURGERY  04/2018  . REPAIR OF RECTAL PROLAPSE N/A 05/11/2017   Procedure: REPAIR OF RECTAL PROLAPSE;  Surgeon: Leonie Green, MD;  Location: [facility] ORS;  Service: General;  Laterality: N/A;  . TONSILLECTOMY    . VAGINA SURGERY     uncertain procedure performed    FAMILY HISTORY: Family History  Problem Relation Age of Onset  . Heart disease Mother   . Heart disease Father   . Heart disease Sister   . Heart attack Sister   . Ulcerative colitis Brother   . Lung cancer Brother   . Thyroid cancer Sister   . Asthma Sister   . Diabetes Sister   . Asthma Sister   . Pancreatic cancer Sister   . Dementia Sister   . Asthma Brother   . Heart disease Brother   . Asthma Brother   . Lung cancer Brother   . Asthma Brother   .  Lung cancer Brother   . Lung cancer Brother   . Rheum arthritis Brother     ADVANCED DIRECTIVES (Y/N):  N  HEALTH MAINTENANCE: Social History   Tobacco Use  . Smoking status: Never Smoker  . Smokeless tobacco: Never Used  Substance Use Topics  . Alcohol use: No  . Drug use: No     Colonoscopy:  PAP:  Bone density:  Lipid panel:  No Known Allergies  Current Outpatient Medications  Medication Sig Dispense Refill  . albuterol (PROVENTIL  HFA;VENTOLIN HFA) 108 (90 Base) MCG/ACT inhaler Inhale 1-2 puffs into the lungs every 6 (six) hours as needed for wheezing or shortness of breath.    . benzonatate (TESSALON) 100 MG capsule Take 1 capsule by mouth 3 (three) times daily.    . cyclobenzaprine (FLEXERIL) 10 MG tablet Take 1 tablet (10 mg total) by mouth 3 (three) times daily as needed for muscle spasms. 30 tablet 1  . diphenhydrAMINE (BENADRYL) 25 MG tablet Take 25 mg by mouth every 6 (six) hours as needed.    Marland Kitchen esomeprazole (NEXIUM) 40 MG capsule Take 40 mg by mouth daily before breakfast.     . fluticasone furoate-vilanterol (BREO ELLIPTA) 100-25 MCG/INH AEPB Inhale 1 puff into the lungs as needed.     . furosemide (LASIX) 40 MG tablet Take 40 mg by mouth as needed.     Marland Kitchen ipratropium-albuterol (DUONEB) 0.5-2.5 (3) MG/3ML SOLN Take 3 mLs by nebulization every 6 (six) hours as needed. For wheezing/shortness of breath    . lidocaine-prilocaine (EMLA) cream Apply 1 application topically as needed. Apply small amount to port site at least 1 hour prior to it being accessed, cover with plastic wrap 30 g 1  . montelukast (SINGULAIR) 10 MG tablet Take 10 mg by mouth at bedtime.    . ondansetron (ZOFRAN) 8 MG tablet Take 1 tablet (8 mg total) by mouth 2 (two) times daily as needed for nausea or vomiting. 30 tablet 2  . polyethylene glycol (MIRALAX / GLYCOLAX) packet Take by mouth.    . valsartan (DIOVAN) 160 MG tablet Start 1/2 tab a day.  Can increase to 1 tab after a few weeks if BP stays high     No current facility-administered medications for this visit.    Facility-Administered Medications Ordered in Other Visits  Medication Dose Route Frequency Provider Last Rate Last Dose  . sodium chloride flush (NS) 0.9 % injection 10 mL  10 mL Intravenous PRN Lloyd Huger, MD   10 mL at 03/10/18 1200  . sodium chloride flush (NS) 0.9 % injection 10 mL  10 mL Intravenous PRN Lloyd Huger, MD   10 mL at 12/15/18 0929    OBJECTIVE:  Vitals:   12/15/18 0953  BP: (!) 158/95  Pulse: 95  Resp: 18     Body mass index is 21.94 kg/m.    ECOG FS:0 - Asymptomatic  General: Thin, no acute distress. Eyes: Pink conjunctiva, anicteric sclera. HEENT: Normocephalic, moist mucous membranes. Lungs: Clear to auscultation bilaterally. Heart: Regular rate and rhythm. No rubs, murmurs, or gallops. Abdomen: Soft, nontender, nondistended. No organomegaly noted, normoactive bowel sounds. Musculoskeletal: No edema, cyanosis, or clubbing. Neuro: Alert, answering all questions appropriately. Cranial nerves grossly intact. Skin: Rash on torso nearly resolved. Psych: Normal affect.  LAB RESULTS:  Lab Results  Component Value Date   NA 135 12/15/2018   K 4.2 12/15/2018   CL 104 12/15/2018   CO2 22 12/15/2018   GLUCOSE 115 (H)  12/15/2018   BUN 25 (H) 12/15/2018   CREATININE 0.83 12/15/2018   CALCIUM 8.7 (L) 12/15/2018   PROT 6.5 12/15/2018   ALBUMIN 4.0 12/15/2018   AST 28 12/15/2018   ALT 15 12/15/2018   ALKPHOS 69 12/15/2018   BILITOT 0.6 12/15/2018   GFRNONAA >60 12/15/2018   GFRAA >60 12/15/2018    Lab Results  Component Value Date   WBC 4.3 12/15/2018   NEUTROABS 2.6 12/15/2018   HGB 9.7 (L) 12/15/2018   HCT 29.6 (L) 12/15/2018   MCV 103.5 (H) 12/15/2018   PLT 184 12/15/2018     STUDIES: No results found.  ASSESSMENT: Stage IIIC ovarian cancer.  PLAN:    1. Stage IIIC ovarian cancer: Patient now has recurrent disease.  CT scan results from May 06, 2018 reviewed independently with multifocal peritoneal metastasis increasing in size and number.  Patient's CA-125 continues to trend down and is now 13.1.  Proceed with cycle 3, day 8 of carboplatinum and Taxol today.  Taxol only.  Return to clinic in 1 week for further evaluation and consideration of cycle 3, day 15.  Plan to discontinue treatment at the conclusion of cycle 3.  2. Lupus anticoagulant: Patient was noted to have an elevated PTT as well as  increased bleeding during her surgery for rectal prolapse. Continue to monitor closely and repeat lupus anticoagulant panel if patient has evidence of bleeding or requires additional procedures.   3. Anemia: Chronic and unchanged.  Patient's hemoglobin is 9.7 today.  Previously, iron stores are within normal limits.  Continue to monitor closely.   4.  Nausea: Patient does not complain of this today.  Continue antiemetics as prescribed. 5.  Leukopenia: Resolved. 6.  Rash: Resolved.  Patient expressed understanding and was in agreement with this plan. She also understands that She can call clinic at any time with any questions, concerns, or complaints.   Cancer Staging Malignant neoplasm of ovary [facility]) Staging form: Ovary, Fallopian Tube, and Primary Peritoneal Carcinoma, AJCC 8th Edition - Clinical stage from 05/29/2017: Stage IIIC (cT3c, cN1b, cM0) - Signed by Lloyd Huger, MD on 05/29/2017   Lloyd Huger, MD   12/15/2018 3:05 PM

## 2018-12-15 ENCOUNTER — Inpatient Hospital Stay (HOSPITAL_BASED_OUTPATIENT_CLINIC_OR_DEPARTMENT_OTHER): Payer: Medicare Other | Admitting: Oncology

## 2018-12-15 ENCOUNTER — Other Ambulatory Visit: Payer: Self-pay

## 2018-12-15 ENCOUNTER — Inpatient Hospital Stay: Payer: Medicare Other

## 2018-12-15 VITALS — Temp 95.0°F

## 2018-12-15 VITALS — BP 158/95 | HR 95 | Resp 18 | Wt 101.4 lb

## 2018-12-15 DIAGNOSIS — D649 Anemia, unspecified: Secondary | ICD-10-CM

## 2018-12-15 DIAGNOSIS — R21 Rash and other nonspecific skin eruption: Secondary | ICD-10-CM

## 2018-12-15 DIAGNOSIS — I10 Essential (primary) hypertension: Secondary | ICD-10-CM

## 2018-12-15 DIAGNOSIS — C786 Secondary malignant neoplasm of retroperitoneum and peritoneum: Secondary | ICD-10-CM

## 2018-12-15 DIAGNOSIS — Z5111 Encounter for antineoplastic chemotherapy: Secondary | ICD-10-CM | POA: Diagnosis not present

## 2018-12-15 DIAGNOSIS — Z90710 Acquired absence of both cervix and uterus: Secondary | ICD-10-CM

## 2018-12-15 DIAGNOSIS — Z79899 Other long term (current) drug therapy: Secondary | ICD-10-CM

## 2018-12-15 DIAGNOSIS — Z7951 Long term (current) use of inhaled steroids: Secondary | ICD-10-CM

## 2018-12-15 DIAGNOSIS — D72819 Decreased white blood cell count, unspecified: Secondary | ICD-10-CM | POA: Diagnosis not present

## 2018-12-15 DIAGNOSIS — C569 Malignant neoplasm of unspecified ovary: Secondary | ICD-10-CM

## 2018-12-15 LAB — COMPREHENSIVE METABOLIC PANEL
ALT: 15 U/L (ref 0–44)
AST: 28 U/L (ref 15–41)
Albumin: 4 g/dL (ref 3.5–5.0)
Alkaline Phosphatase: 69 U/L (ref 38–126)
Anion gap: 9 (ref 5–15)
BUN: 25 mg/dL — ABNORMAL HIGH (ref 8–23)
CO2: 22 mmol/L (ref 22–32)
Calcium: 8.7 mg/dL — ABNORMAL LOW (ref 8.9–10.3)
Chloride: 104 mmol/L (ref 98–111)
Creatinine, Ser: 0.83 mg/dL (ref 0.44–1.00)
GFR calc Af Amer: >60 mL/min (ref >60)
GFR calc non Af Amer: >60 mL/min (ref >60)
Glucose, Bld: 115 mg/dL — ABNORMAL HIGH (ref 70–99)
Potassium: 4.2 mmol/L (ref 3.5–5.1)
Sodium: 135 mmol/L (ref 135–145)
Total Bilirubin: 0.6 mg/dL (ref 0.3–1.2)
Total Protein: 6.5 g/dL (ref 6.5–8.1)

## 2018-12-15 LAB — CBC WITH DIFFERENTIAL/PLATELET
Abs Immature Granulocytes: 0.04 10*3/uL (ref 0.00–0.07)
Basophils Absolute: 0 10*3/uL (ref 0.0–0.1)
Basophils Relative: 1 %
Eosinophils Absolute: 0 10*3/uL (ref 0.0–0.5)
Eosinophils Relative: 1 %
HCT: 29.6 % — ABNORMAL LOW (ref 36.0–46.0)
Hemoglobin: 9.7 g/dL — ABNORMAL LOW (ref 12.0–15.0)
Immature Granulocytes: 1 %
Lymphocytes Relative: 27 %
Lymphs Abs: 1.2 10*3/uL (ref 0.7–4.0)
MCH: 33.9 pg (ref 26.0–34.0)
MCHC: 32.8 g/dL (ref 30.0–36.0)
MCV: 103.5 fL — ABNORMAL HIGH (ref 80.0–100.0)
Monocytes Absolute: 0.5 10*3/uL (ref 0.1–1.0)
Monocytes Relative: 12 %
Neutro Abs: 2.6 10*3/uL (ref 1.7–7.7)
Neutrophils Relative %: 58 %
Platelets: 184 10*3/uL (ref 150–400)
RBC: 2.86 MIL/uL — ABNORMAL LOW (ref 3.87–5.11)
RDW: 15.8 % — ABNORMAL HIGH (ref 11.5–15.5)
WBC: 4.3 10*3/uL (ref 4.0–10.5)
nRBC: 0 % (ref 0.0–0.2)

## 2018-12-15 MED ORDER — SODIUM CHLORIDE 0.9% FLUSH
10.0000 mL | INTRAVENOUS | Status: DC | PRN
  Administered 2018-12-15: 10 mL via INTRAVENOUS
  Filled 2018-12-15: qty 10

## 2018-12-15 MED ORDER — SODIUM CHLORIDE 0.9 % IV SOLN
72.0000 mg/m2 | Freq: Once | INTRAVENOUS | Status: AC
  Administered 2018-12-15: 96 mg via INTRAVENOUS
  Filled 2018-12-15: qty 16

## 2018-12-15 MED ORDER — FAMOTIDINE IN NACL 20-0.9 MG/50ML-% IV SOLN
20.0000 mg | Freq: Once | INTRAVENOUS | Status: AC
  Administered 2018-12-15: 20 mg via INTRAVENOUS
  Filled 2018-12-15: qty 50

## 2018-12-15 MED ORDER — DEXAMETHASONE SODIUM PHOSPHATE 10 MG/ML IJ SOLN
10.0000 mg | Freq: Once | INTRAMUSCULAR | Status: AC
  Administered 2018-12-15: 10 mg via INTRAVENOUS
  Filled 2018-12-15: qty 1

## 2018-12-15 MED ORDER — DIPHENHYDRAMINE HCL 50 MG/ML IJ SOLN
25.0000 mg | Freq: Once | INTRAMUSCULAR | Status: AC
  Administered 2018-12-15: 25 mg via INTRAVENOUS
  Filled 2018-12-15: qty 1

## 2018-12-15 MED ORDER — SODIUM CHLORIDE 0.9 % IV SOLN
Freq: Once | INTRAVENOUS | Status: AC
  Administered 2018-12-15: 10:00:00 via INTRAVENOUS
  Filled 2018-12-15: qty 250

## 2018-12-15 MED ORDER — HEPARIN SOD (PORK) LOCK FLUSH 100 UNIT/ML IV SOLN
500.0000 [IU] | Freq: Once | INTRAVENOUS | Status: AC
  Administered 2018-12-15: 500 [IU] via INTRAVENOUS
  Filled 2018-12-15: qty 5

## 2018-12-15 NOTE — Progress Notes (Signed)
Patient denies any concerns today.  

## 2018-12-16 LAB — CA 125: Cancer Antigen (CA) 125: 15.5 U/mL (ref 0.0–38.1)

## 2018-12-22 ENCOUNTER — Inpatient Hospital Stay (HOSPITAL_BASED_OUTPATIENT_CLINIC_OR_DEPARTMENT_OTHER): Payer: Medicare Other | Admitting: Oncology

## 2018-12-22 ENCOUNTER — Other Ambulatory Visit: Payer: Self-pay

## 2018-12-22 ENCOUNTER — Inpatient Hospital Stay: Payer: Medicare Other | Attending: Oncology

## 2018-12-22 ENCOUNTER — Inpatient Hospital Stay: Payer: Medicare Other

## 2018-12-22 VITALS — BP 146/80 | HR 91 | Temp 98.1°F | Resp 20 | Ht <= 58 in | Wt 97.1 lb

## 2018-12-22 DIAGNOSIS — C569 Malignant neoplasm of unspecified ovary: Secondary | ICD-10-CM

## 2018-12-22 DIAGNOSIS — Z800 Family history of malignant neoplasm of digestive organs: Secondary | ICD-10-CM

## 2018-12-22 DIAGNOSIS — I10 Essential (primary) hypertension: Secondary | ICD-10-CM

## 2018-12-22 DIAGNOSIS — M1990 Unspecified osteoarthritis, unspecified site: Secondary | ICD-10-CM

## 2018-12-22 DIAGNOSIS — R791 Abnormal coagulation profile: Secondary | ICD-10-CM | POA: Insufficient documentation

## 2018-12-22 DIAGNOSIS — Z79899 Other long term (current) drug therapy: Secondary | ICD-10-CM | POA: Insufficient documentation

## 2018-12-22 DIAGNOSIS — Z801 Family history of malignant neoplasm of trachea, bronchus and lung: Secondary | ICD-10-CM

## 2018-12-22 DIAGNOSIS — D649 Anemia, unspecified: Secondary | ICD-10-CM

## 2018-12-22 DIAGNOSIS — D72819 Decreased white blood cell count, unspecified: Secondary | ICD-10-CM

## 2018-12-22 DIAGNOSIS — Z95828 Presence of other vascular implants and grafts: Secondary | ICD-10-CM

## 2018-12-22 DIAGNOSIS — Z7951 Long term (current) use of inhaled steroids: Secondary | ICD-10-CM | POA: Insufficient documentation

## 2018-12-22 DIAGNOSIS — C786 Secondary malignant neoplasm of retroperitoneum and peritoneum: Secondary | ICD-10-CM

## 2018-12-22 DIAGNOSIS — K219 Gastro-esophageal reflux disease without esophagitis: Secondary | ICD-10-CM | POA: Insufficient documentation

## 2018-12-22 DIAGNOSIS — Z5111 Encounter for antineoplastic chemotherapy: Secondary | ICD-10-CM | POA: Insufficient documentation

## 2018-12-22 LAB — COMPREHENSIVE METABOLIC PANEL
ALT: 14 U/L (ref 0–44)
AST: 25 U/L (ref 15–41)
Albumin: 3.8 g/dL (ref 3.5–5.0)
Alkaline Phosphatase: 71 U/L (ref 38–126)
Anion gap: 7 (ref 5–15)
BUN: 31 mg/dL — ABNORMAL HIGH (ref 8–23)
CO2: 23 mmol/L (ref 22–32)
Calcium: 8.5 mg/dL — ABNORMAL LOW (ref 8.9–10.3)
Chloride: 104 mmol/L (ref 98–111)
Creatinine, Ser: 0.9 mg/dL (ref 0.44–1.00)
GFR calc Af Amer: >60 mL/min (ref >60)
GFR calc non Af Amer: 57 mL/min — ABNORMAL LOW (ref >60)
Glucose, Bld: 114 mg/dL — ABNORMAL HIGH (ref 70–99)
Potassium: 4.6 mmol/L (ref 3.5–5.1)
Sodium: 134 mmol/L — ABNORMAL LOW (ref 135–145)
Total Bilirubin: 0.6 mg/dL (ref 0.3–1.2)
Total Protein: 6.5 g/dL (ref 6.5–8.1)

## 2018-12-22 LAB — CBC WITH DIFFERENTIAL/PLATELET
Abs Immature Granulocytes: 0.02 10*3/uL (ref 0.00–0.07)
Basophils Absolute: 0 10*3/uL (ref 0.0–0.1)
Basophils Relative: 1 %
Eosinophils Absolute: 0 10*3/uL (ref 0.0–0.5)
Eosinophils Relative: 0 %
HCT: 27.2 % — ABNORMAL LOW (ref 36.0–46.0)
Hemoglobin: 8.8 g/dL — ABNORMAL LOW (ref 12.0–15.0)
Immature Granulocytes: 1 %
Lymphocytes Relative: 23 %
Lymphs Abs: 0.8 10*3/uL (ref 0.7–4.0)
MCH: 33.7 pg (ref 26.0–34.0)
MCHC: 32.4 g/dL (ref 30.0–36.0)
MCV: 104.2 fL — ABNORMAL HIGH (ref 80.0–100.0)
Monocytes Absolute: 0.2 10*3/uL (ref 0.1–1.0)
Monocytes Relative: 7 %
Neutro Abs: 2.4 10*3/uL (ref 1.7–7.7)
Neutrophils Relative %: 68 %
Platelets: 163 10*3/uL (ref 150–400)
RBC: 2.61 MIL/uL — ABNORMAL LOW (ref 3.87–5.11)
RDW: 14.8 % (ref 11.5–15.5)
WBC: 3.5 10*3/uL — ABNORMAL LOW (ref 4.0–10.5)
nRBC: 0 % (ref 0.0–0.2)

## 2018-12-22 MED ORDER — SODIUM CHLORIDE 0.9 % IV SOLN
72.0000 mg/m2 | Freq: Once | INTRAVENOUS | Status: AC
  Administered 2018-12-22: 96 mg via INTRAVENOUS
  Filled 2018-12-22: qty 16

## 2018-12-22 MED ORDER — FAMOTIDINE IN NACL 20-0.9 MG/50ML-% IV SOLN
20.0000 mg | Freq: Once | INTRAVENOUS | Status: AC
  Administered 2018-12-22: 20 mg via INTRAVENOUS
  Filled 2018-12-22: qty 50

## 2018-12-22 MED ORDER — SODIUM CHLORIDE 0.9% FLUSH
10.0000 mL | Freq: Once | INTRAVENOUS | Status: AC
  Administered 2018-12-22: 10 mL via INTRAVENOUS
  Filled 2018-12-22: qty 10

## 2018-12-22 MED ORDER — HEPARIN SOD (PORK) LOCK FLUSH 100 UNIT/ML IV SOLN
500.0000 [IU] | Freq: Once | INTRAVENOUS | Status: AC
  Administered 2018-12-22: 500 [IU] via INTRAVENOUS
  Filled 2018-12-22: qty 5

## 2018-12-22 MED ORDER — SODIUM CHLORIDE 0.9 % IV SOLN
Freq: Once | INTRAVENOUS | Status: AC
  Administered 2018-12-22: 11:00:00 via INTRAVENOUS
  Filled 2018-12-22: qty 250

## 2018-12-22 MED ORDER — DEXAMETHASONE SODIUM PHOSPHATE 10 MG/ML IJ SOLN
10.0000 mg | Freq: Once | INTRAMUSCULAR | Status: AC
  Administered 2018-12-22: 10 mg via INTRAVENOUS
  Filled 2018-12-22: qty 1

## 2018-12-22 MED ORDER — DIPHENHYDRAMINE HCL 50 MG/ML IJ SOLN
25.0000 mg | Freq: Once | INTRAMUSCULAR | Status: AC
  Administered 2018-12-22: 25 mg via INTRAVENOUS
  Filled 2018-12-22: qty 1

## 2018-12-22 NOTE — Progress Notes (Signed)
[place]  Telephone:(336) [phone] Fax:(336) [phone]  ID: Tamara Mckinney OB: 05-02-1928  MR#: [id number]  WVP#:[id number]  Patient Care Team: [name], MD as PCP - General (Family Medicine) [name], RN as Registered Nurse Herbert Pun, MD as Consulting Physician (General Surgery)  CHIEF COMPLAINT: Stage IIIC ovarian cancer.  INTERVAL HISTORY: Patient returns to clinic today for further evaluation and consideration of cycle 3, day 15 of carboplatinum and Taxol.  Taxol only today.  She continues to tolerate her treatments well without significant side effects.  She currently feels well and is asymptomatic.  Her rash has resolved.  She has no neurologic complaints.  She has good appetite and her weight has remained stable.  She denies any chest pain, shortness of breath, cough, or hemoptysis.  She has no nausea, vomiting, constipation, or diarrhea. She has no urinary complaints.  Patient feels at her baseline offers no specific complaints today.  REVIEW OF SYSTEMS:   Review of Systems  Constitutional: Negative.  Negative for fever, malaise/fatigue and weight loss.  Respiratory: Negative.  Negative for cough and shortness of breath.   Cardiovascular: Negative.  Negative for chest pain and leg swelling.  Gastrointestinal: Negative.  Negative for abdominal pain, blood in stool, constipation, diarrhea, heartburn, melena, nausea and vomiting.  Genitourinary: Negative.  Negative for dysuria.  Musculoskeletal: Negative.  Negative for back pain and neck pain.  Skin: Negative.  Negative for itching and rash.  Neurological: Negative.  Negative for dizziness, focal weakness and weakness.  Endo/Heme/Allergies: Does not bruise/bleed easily.  Psychiatric/Behavioral: Negative.  The patient is not nervous/anxious.     As per HPI. Otherwise, a complete review of systems is negative.  PAST MEDICAL HISTORY: Past Medical History:  Diagnosis Date  . Anemia    . Arthritis   . Asthma   . Cancer ([place])   . Dyspnea   . GERD (gastroesophageal reflux disease)   . Hypertension     PAST SURGICAL HISTORY: Past Surgical History:  Procedure Laterality Date  . ABDOMINAL HYSTERECTOMY    . BREAST BIOPSY     x6  . BUNIONECTOMY Bilateral   . CATARACT EXTRACTION, BILATERAL    . FLEXIBLE SIGMOIDOSCOPY N/A 05/21/2017   Procedure: FLEXIBLE SIGMOIDOSCOPY;  Surgeon: Lin Landsman, MD;  Location: [facility] ENDOSCOPY;  Service: Gastroenterology;  Laterality: N/A;  . INCONTINENCE SURGERY    . PORTA CATH INSERTION N/A 06/16/2017   Procedure: PORTA CATH INSERTION;  Surgeon: Algernon Huxley, MD;  Location: [place] CV LAB;  Service: Cardiovascular;  Laterality: N/A;  . RECTAL SURGERY  04/2018  . REPAIR OF RECTAL PROLAPSE N/A 05/11/2017   Procedure: REPAIR OF RECTAL PROLAPSE;  Surgeon: Leonie Green, MD;  Location: [facility] ORS;  Service: General;  Laterality: N/A;  . TONSILLECTOMY    . VAGINA SURGERY     uncertain procedure performed    FAMILY HISTORY: Family History  Problem Relation Age of Onset  . Heart disease Mother   . Heart disease Father   . Heart disease Sister   . Heart attack Sister   . Ulcerative colitis Brother   . Lung cancer Brother   . Thyroid cancer Sister   . Asthma Sister   . Diabetes Sister   . Asthma Sister   . Pancreatic cancer Sister   . Dementia Sister   . Asthma Brother   . Heart disease Brother   . Asthma Brother   . Lung cancer Brother   . Asthma Brother   .  Lung cancer Brother   . Lung cancer Brother   . Rheum arthritis Brother     ADVANCED DIRECTIVES (Y/N):  N  HEALTH MAINTENANCE: Social History   Tobacco Use  . Smoking status: Never Smoker  . Smokeless tobacco: Never Used  Substance Use Topics  . Alcohol use: No  . Drug use: No     Colonoscopy:  PAP:  Bone density:  Lipid panel:  No Known Allergies  Current Outpatient Medications  Medication Sig Dispense Refill  . esomeprazole (NEXIUM) 40 MG  capsule Take 40 mg by mouth daily before breakfast.     . albuterol (PROVENTIL HFA;VENTOLIN HFA) 108 (90 Base) MCG/ACT inhaler Inhale 1-2 puffs into the lungs every 6 (six) hours as needed for wheezing or shortness of breath.    . benzonatate (TESSALON) 100 MG capsule Take 1 capsule by mouth 3 (three) times daily.    . cyclobenzaprine (FLEXERIL) 10 MG tablet Take 1 tablet (10 mg total) by mouth 3 (three) times daily as needed for muscle spasms. (Patient not taking: Reported on 12/22/2018) 30 tablet 1  . diphenhydrAMINE (BENADRYL) 25 MG tablet Take 25 mg by mouth every 6 (six) hours as needed.    . fluticasone furoate-vilanterol (BREO ELLIPTA) 100-25 MCG/INH AEPB Inhale 1 puff into the lungs as needed.     . furosemide (LASIX) 40 MG tablet Take 40 mg by mouth as needed.     Marland Kitchen ipratropium-albuterol (DUONEB) 0.5-2.5 (3) MG/3ML SOLN Take 3 mLs by nebulization every 6 (six) hours as needed. For wheezing/shortness of breath    . lidocaine-prilocaine (EMLA) cream Apply 1 application topically as needed. Apply small amount to port site at least 1 hour prior to it being accessed, cover with plastic wrap (Patient not taking: Reported on 12/22/2018) 30 g 1  . montelukast (SINGULAIR) 10 MG tablet Take 10 mg by mouth at bedtime.    . ondansetron (ZOFRAN) 8 MG tablet Take 1 tablet (8 mg total) by mouth 2 (two) times daily as needed for nausea or vomiting. (Patient not taking: Reported on 12/22/2018) 30 tablet 2  . polyethylene glycol (MIRALAX / GLYCOLAX) packet Take by mouth.    . valsartan (DIOVAN) 160 MG tablet Start 1/2 tab a day.  Can increase to 1 tab after a few weeks if BP stays high     No current facility-administered medications for this visit.    Facility-Administered Medications Ordered in Other Visits  Medication Dose Route Frequency Provider Last Rate Last Dose  . heparin lock flush 100 unit/mL  500 Units Intravenous Once Lloyd Huger, MD      . PACLitaxel (TAXOL) 96 mg in sodium chloride 0.9 %  250 mL chemo infusion (</= 80mg /m2)  72 mg/m2 (Order-Specific) Intravenous Once Lloyd Huger, MD 266 mL/hr at 12/22/18 1214    . sodium chloride flush (NS) 0.9 % injection 10 mL  10 mL Intravenous PRN Lloyd Huger, MD   10 mL at 03/10/18 1200    OBJECTIVE: Vitals:   12/22/18 1019  BP: (!) 146/80  Pulse: 91  Resp: 20  Temp: 98.1 F (36.7 C)     Body mass index is 20.65 kg/m.    ECOG FS:0 - Asymptomatic  General: Thin, no acute distress. Eyes: Pink conjunctiva, anicteric sclera. HEENT: Normocephalic, moist mucous membranes, clear oropharnyx. Lungs: Clear to auscultation bilaterally. Heart: Regular rate and rhythm. No rubs, murmurs, or gallops. Abdomen: Soft, nontender, nondistended. No organomegaly noted, normoactive bowel sounds. Musculoskeletal: No edema, cyanosis, or clubbing. Neuro:  Alert, answering all questions appropriately. Cranial nerves grossly intact. Skin: No rashes or petechiae noted. Psych: Normal affect.  LAB RESULTS:  Lab Results  Component Value Date   NA 134 (L) 12/22/2018   K 4.6 12/22/2018   CL 104 12/22/2018   CO2 23 12/22/2018   GLUCOSE 114 (H) 12/22/2018   BUN 31 (H) 12/22/2018   CREATININE 0.90 12/22/2018   CALCIUM 8.5 (L) 12/22/2018   PROT 6.5 12/22/2018   ALBUMIN 3.8 12/22/2018   AST 25 12/22/2018   ALT 14 12/22/2018   ALKPHOS 71 12/22/2018   BILITOT 0.6 12/22/2018   GFRNONAA 57 (L) 12/22/2018   GFRAA >60 12/22/2018    Lab Results  Component Value Date   WBC 3.5 (L) 12/22/2018   NEUTROABS 2.4 12/22/2018   HGB 8.8 (L) 12/22/2018   HCT 27.2 (L) 12/22/2018   MCV 104.2 (H) 12/22/2018   PLT 163 12/22/2018     STUDIES: No results found.  ASSESSMENT: Stage IIIC ovarian cancer.  PLAN:    1. Stage IIIC ovarian cancer: Patient now has recurrent disease.  CT scan results from May 06, 2018 reviewed independently with multifocal peritoneal metastasis increasing in size and number.  Patient's CA-125 continues to trend  down and is now 15.5.  Proceed with cycle 3, day 15 of carboplatin and Taxol today.  Taxol only.  No further interventions are needed at this time.  Patient appears to be in a very good partial remission.  Return to clinic in 6 weeks for laboratory work only and then in 3 months with repeat imaging and further evaluation.    2. Lupus anticoagulant: Patient was noted to have an elevated PTT as well as increased bleeding during her surgery for rectal prolapse. Continue to monitor closely and repeat lupus anticoagulant panel if patient has evidence of bleeding or requires additional procedures.   3. Anemia: Patient's hemoglobin has trended down slightly to 8.8.  Previously, iron stores are within normal limits.  Continue to monitor closely.   4.  Nausea: Patient does not complain of this today.  Continue antiemetics as prescribed. 5.  Leukopenia: Mild.  Patient will complete treatment today. 6.  Rash: Resolved.  Patient expressed understanding and was in agreement with this plan. She also understands that She can call clinic at any time with any questions, concerns, or complaints.   Cancer Staging Malignant neoplasm of ovary [facility]) Staging form: Ovary, Fallopian Tube, and Primary Peritoneal Carcinoma, AJCC 8th Edition - Clinical stage from 05/29/2017: Stage IIIC (cT3c, cN1b, cM0) - Signed by Lloyd Huger, MD on 05/29/2017   Lloyd Huger, MD   12/22/2018 12:20 PM

## 2018-12-23 LAB — CA 125: Cancer Antigen (CA) 125: 13.2 U/mL (ref 0.0–38.1)

## 2019-01-12 ENCOUNTER — Other Ambulatory Visit: Payer: Self-pay | Admitting: Oncology

## 2019-02-01 ENCOUNTER — Other Ambulatory Visit: Payer: Self-pay

## 2019-02-02 ENCOUNTER — Inpatient Hospital Stay: Payer: Medicare Other | Attending: Oncology

## 2019-02-02 ENCOUNTER — Other Ambulatory Visit: Payer: Self-pay

## 2019-02-02 DIAGNOSIS — C569 Malignant neoplasm of unspecified ovary: Secondary | ICD-10-CM | POA: Insufficient documentation

## 2019-02-02 DIAGNOSIS — Z95828 Presence of other vascular implants and grafts: Secondary | ICD-10-CM

## 2019-02-02 DIAGNOSIS — Z9221 Personal history of antineoplastic chemotherapy: Secondary | ICD-10-CM | POA: Insufficient documentation

## 2019-02-02 DIAGNOSIS — Z452 Encounter for adjustment and management of vascular access device: Secondary | ICD-10-CM | POA: Diagnosis not present

## 2019-02-02 DIAGNOSIS — C786 Secondary malignant neoplasm of retroperitoneum and peritoneum: Secondary | ICD-10-CM | POA: Diagnosis not present

## 2019-02-02 LAB — COMPREHENSIVE METABOLIC PANEL
ALT: 15 U/L (ref 0–44)
AST: 26 U/L (ref 15–41)
Albumin: 4.1 g/dL (ref 3.5–5.0)
Alkaline Phosphatase: 89 U/L (ref 38–126)
Anion gap: 10 (ref 5–15)
BUN: 27 mg/dL — ABNORMAL HIGH (ref 8–23)
CO2: 23 mmol/L (ref 22–32)
Calcium: 8.4 mg/dL — ABNORMAL LOW (ref 8.9–10.3)
Chloride: 99 mmol/L (ref 98–111)
Creatinine, Ser: 0.94 mg/dL (ref 0.44–1.00)
GFR calc Af Amer: >60 mL/min (ref >60)
GFR calc non Af Amer: 54 mL/min — ABNORMAL LOW (ref >60)
Glucose, Bld: 170 mg/dL — ABNORMAL HIGH (ref 70–99)
Potassium: 4.4 mmol/L (ref 3.5–5.1)
Sodium: 132 mmol/L — ABNORMAL LOW (ref 135–145)
Total Bilirubin: 0.3 mg/dL (ref 0.3–1.2)
Total Protein: 6.7 g/dL (ref 6.5–8.1)

## 2019-02-02 LAB — CBC WITH DIFFERENTIAL/PLATELET
Abs Immature Granulocytes: 0.02 10*3/uL (ref 0.00–0.07)
Basophils Absolute: 0.1 10*3/uL (ref 0.0–0.1)
Basophils Relative: 1 %
Eosinophils Absolute: 0.1 10*3/uL (ref 0.0–0.5)
Eosinophils Relative: 2 %
HCT: 30.3 % — ABNORMAL LOW (ref 36.0–46.0)
Hemoglobin: 10.1 g/dL — ABNORMAL LOW (ref 12.0–15.0)
Immature Granulocytes: 0 %
Lymphocytes Relative: 15 %
Lymphs Abs: 1 10*3/uL (ref 0.7–4.0)
MCH: 33 pg (ref 26.0–34.0)
MCHC: 33.3 g/dL (ref 30.0–36.0)
MCV: 99 fL (ref 80.0–100.0)
Monocytes Absolute: 0.5 10*3/uL (ref 0.1–1.0)
Monocytes Relative: 7 %
Neutro Abs: 4.6 10*3/uL (ref 1.7–7.7)
Neutrophils Relative %: 75 %
Platelets: 244 10*3/uL (ref 150–400)
RBC: 3.06 MIL/uL — ABNORMAL LOW (ref 3.87–5.11)
RDW: 13 % (ref 11.5–15.5)
WBC: 6.2 10*3/uL (ref 4.0–10.5)
nRBC: 0 % (ref 0.0–0.2)

## 2019-02-02 MED ORDER — HEPARIN SOD (PORK) LOCK FLUSH 100 UNIT/ML IV SOLN
500.0000 [IU] | Freq: Once | INTRAVENOUS | Status: AC
  Administered 2019-02-02: 500 [IU] via INTRAVENOUS

## 2019-02-02 MED ORDER — SODIUM CHLORIDE 0.9% FLUSH
10.0000 mL | Freq: Once | INTRAVENOUS | Status: AC
  Administered 2019-02-02: 10 mL via INTRAVENOUS
  Filled 2019-02-02: qty 10

## 2019-02-03 LAB — CA 125: Cancer Antigen (CA) 125: 42.3 U/mL — ABNORMAL HIGH (ref 0.0–38.1)

## 2019-03-16 ENCOUNTER — Ambulatory Visit: Payer: Medicare Other | Admitting: Oncology

## 2019-03-16 ENCOUNTER — Other Ambulatory Visit: Payer: Self-pay

## 2019-03-16 ENCOUNTER — Ambulatory Visit
Admission: RE | Admit: 2019-03-16 | Discharge: 2019-03-16 | Disposition: A | Discharge status: Home / Self Care | Payer: Medicare Other | Source: Ambulatory Visit | Attending: Oncology | Admitting: Oncology

## 2019-03-16 ENCOUNTER — Inpatient Hospital Stay: Payer: Medicare Other | Attending: Oncology

## 2019-03-16 DIAGNOSIS — K449 Diaphragmatic hernia without obstruction or gangrene: Secondary | ICD-10-CM | POA: Insufficient documentation

## 2019-03-16 DIAGNOSIS — C786 Secondary malignant neoplasm of retroperitoneum and peritoneum: Secondary | ICD-10-CM | POA: Insufficient documentation

## 2019-03-16 DIAGNOSIS — C569 Malignant neoplasm of unspecified ovary: Secondary | ICD-10-CM | POA: Diagnosis not present

## 2019-03-16 DIAGNOSIS — Z452 Encounter for adjustment and management of vascular access device: Secondary | ICD-10-CM | POA: Insufficient documentation

## 2019-03-16 DIAGNOSIS — Z95828 Presence of other vascular implants and grafts: Secondary | ICD-10-CM

## 2019-03-16 DIAGNOSIS — R911 Solitary pulmonary nodule: Secondary | ICD-10-CM | POA: Diagnosis not present

## 2019-03-16 LAB — COMPREHENSIVE METABOLIC PANEL
ALT: 15 U/L (ref 0–44)
AST: 29 U/L (ref 15–41)
Albumin: 4.1 g/dL (ref 3.5–5.0)
Alkaline Phosphatase: 95 U/L (ref 38–126)
Anion gap: 11 (ref 5–15)
BUN: 22 mg/dL (ref 8–23)
CO2: 24 mmol/L (ref 22–32)
Calcium: 9 mg/dL (ref 8.9–10.3)
Chloride: 95 mmol/L — ABNORMAL LOW (ref 98–111)
Creatinine, Ser: 0.73 mg/dL (ref 0.44–1.00)
GFR calc Af Amer: >60 mL/min (ref >60)
GFR calc non Af Amer: >60 mL/min (ref >60)
Glucose, Bld: 88 mg/dL (ref 70–99)
Potassium: 4.3 mmol/L (ref 3.5–5.1)
Sodium: 130 mmol/L — ABNORMAL LOW (ref 135–145)
Total Bilirubin: 0.6 mg/dL (ref 0.3–1.2)
Total Protein: 7.1 g/dL (ref 6.5–8.1)

## 2019-03-16 LAB — CBC WITH DIFFERENTIAL/PLATELET
Abs Immature Granulocytes: 0.01 10*3/uL (ref 0.00–0.07)
Basophils Absolute: 0 10*3/uL (ref 0.0–0.1)
Basophils Relative: 1 %
Eosinophils Absolute: 0 10*3/uL (ref 0.0–0.5)
Eosinophils Relative: 1 %
HCT: 31.7 % — ABNORMAL LOW (ref 36.0–46.0)
Hemoglobin: 10.4 g/dL — ABNORMAL LOW (ref 12.0–15.0)
Immature Granulocytes: 0 %
Lymphocytes Relative: 16 %
Lymphs Abs: 0.8 10*3/uL (ref 0.7–4.0)
MCH: 30.9 pg (ref 26.0–34.0)
MCHC: 32.8 g/dL (ref 30.0–36.0)
MCV: 94.1 fL (ref 80.0–100.0)
Monocytes Absolute: 0.4 10*3/uL (ref 0.1–1.0)
Monocytes Relative: 8 %
Neutro Abs: 3.5 10*3/uL (ref 1.7–7.7)
Neutrophils Relative %: 74 %
Platelets: 227 10*3/uL (ref 150–400)
RBC: 3.37 MIL/uL — ABNORMAL LOW (ref 3.87–5.11)
RDW: 13.3 % (ref 11.5–15.5)
WBC: 4.8 10*3/uL (ref 4.0–10.5)
nRBC: 0 % (ref 0.0–0.2)

## 2019-03-16 MED ORDER — HEPARIN SOD (PORK) LOCK FLUSH 100 UNIT/ML IV SOLN
500.0000 [IU] | Freq: Once | INTRAVENOUS | Status: AC
  Administered 2019-03-16: 500 [IU] via INTRAVENOUS

## 2019-03-16 MED ORDER — IOHEXOL 300 MG/ML  SOLN
75.0000 mL | Freq: Once | INTRAMUSCULAR | Status: AC | PRN
  Administered 2019-03-16: 75 mL via INTRAVENOUS

## 2019-03-16 MED ORDER — IOHEXOL 300 MG/ML  SOLN: 100.0000 mL | Freq: Once | INTRAMUSCULAR | Status: DC | PRN

## 2019-03-16 MED ORDER — SODIUM CHLORIDE 0.9% FLUSH
10.0000 mL | Freq: Once | INTRAVENOUS | Status: AC
  Administered 2019-03-16: 10 mL via INTRAVENOUS
  Filled 2019-03-16: qty 10

## 2019-03-17 LAB — CA 125: Cancer Antigen (CA) 125: 145 U/mL — ABNORMAL HIGH (ref 0.0–38.1)

## 2019-03-19 NOTE — Progress Notes (Signed)
[place]  [phone] Fax:(336) [phone]  ID: Tamara Mckinney OB: March 29, 1928  MR#: [id number]  [id number]#:[id number]  Patient Care Team: [name], MD as PCP - General (Family Medicine) [name], RN as Registered Nurse Herbert Pun, MD as Consulting Physician (General Surgery)  CHIEF COMPLAINT: Stage IIIC ovarian cancer.  INTERVAL HISTORY: Patient returns to clinic today for further evaluation and discussion of her imaging results.  She currently feels well and is asymptomatic.  She has some mild abdominal bloating, but denies any pain.  She has no neurologic complaints.  She has good appetite and her weight has remained stable.  She denies any chest pain, shortness of breath, cough, or hemoptysis.  She denies any nausea, vomiting, or diarrhea.  She does admit to occasional constipation.  She has no urinary complaints.  Patient offers no further specific complaints today.  REVIEW OF SYSTEMS:   Review of Systems  Constitutional: Negative.  Negative for fever, malaise/fatigue and weight loss.  Respiratory: Negative.  Negative for cough and shortness of breath.   Cardiovascular: Negative.  Negative for chest pain and leg swelling.  Gastrointestinal: Positive for constipation. Negative for abdominal pain, blood in stool, diarrhea, heartburn, melena, nausea and vomiting.  Genitourinary: Negative.  Negative for dysuria.  Musculoskeletal: Negative.  Negative for back pain and neck pain.  Skin: Negative.  Negative for itching and rash.  Neurological: Negative.  Negative for dizziness, focal weakness and weakness.  Endo/Heme/Allergies: Does not bruise/bleed easily.  Psychiatric/Behavioral: Negative.  The patient is not nervous/anxious.     As per HPI. Otherwise, a complete review of systems is negative.  PAST MEDICAL HISTORY: Past Medical History:  Diagnosis Date   Anemia    Arthritis    Asthma    Cancer ([place])    Dyspnea    GERD  (gastroesophageal reflux disease)    Hypertension     PAST SURGICAL HISTORY: Past Surgical History:  Procedure Laterality Date   ABDOMINAL HYSTERECTOMY     BREAST BIOPSY     x6   BUNIONECTOMY Bilateral    CATARACT EXTRACTION, BILATERAL     FLEXIBLE SIGMOIDOSCOPY N/A 05/21/2017   Procedure: FLEXIBLE SIGMOIDOSCOPY;  Surgeon: Lin Landsman, MD;  Location: [facility];  Service: Gastroenterology;  Laterality: N/A;   INCONTINENCE SURGERY     PORTA CATH INSERTION N/A 06/16/2017   Procedure: PORTA CATH INSERTION;  Surgeon: Algernon Huxley, MD;  Location: [place] [facility];  Service: Cardiovascular;  Laterality: N/A;   RECTAL SURGERY  04/2018   REPAIR OF RECTAL PROLAPSE N/A 05/11/2017   Procedure: REPAIR OF RECTAL PROLAPSE;  Surgeon: Leonie Green, MD;  Location: [facility];  Service: General;  Laterality: N/A;   TONSILLECTOMY     VAGINA SURGERY     uncertain procedure performed    FAMILY HISTORY: Family History  Problem Relation Age of Onset   Heart disease Mother    Heart disease Father    Heart disease Sister    Heart attack Sister    Ulcerative colitis Brother    Lung cancer Brother    Thyroid cancer Sister    Asthma Sister    Diabetes Sister    Asthma Sister    Pancreatic cancer Sister    Dementia Sister    Asthma Brother    Heart disease Brother    Asthma Brother    Lung cancer Brother    Asthma Brother    Lung cancer Brother    Lung cancer Brother  Rheum arthritis Brother     ADVANCED DIRECTIVES (Y/N):  N  HEALTH MAINTENANCE: Social History   Tobacco Use   Smoking status: Never Smoker   Smokeless tobacco: Never Used  Substance Use Topics   Alcohol use: No   Drug use: No     Colonoscopy:  PAP:  Bone density:  Lipid panel:  No Known Allergies  Current Outpatient Medications  Medication Sig Dispense Refill   cyclobenzaprine (FLEXERIL) 10 MG tablet Take 1 tablet (10 mg total) by mouth 3 (three) times  daily as needed for muscle spasms. 30 tablet 1   diphenhydrAMINE (BENADRYL) 25 MG tablet Take 25 mg by mouth every 6 (six) hours as needed.     esomeprazole (NEXIUM) 40 MG capsule Take 40 mg by mouth daily before breakfast.      lidocaine-prilocaine (EMLA) cream APPLY A SMALL AMOUNT TO PORT SITE AT LEAST 1 HOUR PRIOR TO IT BEING ACCESSED THEN COVER WITH PLASTIC WRAP 30 g 0   montelukast (SINGULAIR) 10 MG tablet Take 10 mg by mouth at bedtime.     polyethylene glycol (MIRALAX / GLYCOLAX) packet Take by mouth.     valsartan (DIOVAN) 160 MG tablet Start 1/2 tab a day.  Can increase to 1 tab after a few weeks if BP stays high     albuterol (PROVENTIL HFA;VENTOLIN HFA) 108 (90 Base) MCG/ACT inhaler Inhale 1-2 puffs into the lungs every 6 (six) hours as needed for wheezing or shortness of breath.     benzonatate (TESSALON) 100 MG capsule Take 1 capsule by mouth 3 (three) times daily.     fluticasone furoate-vilanterol (BREO ELLIPTA) 100-25 MCG/INH AEPB Inhale 1 puff into the lungs as needed.      ipratropium-albuterol (DUONEB) 0.5-2.5 (3) MG/3ML SOLN Take 3 mLs by nebulization every 6 (six) hours as needed. For wheezing/shortness of breath     ondansetron (ZOFRAN) 8 MG tablet Take 1 tablet (8 mg total) by mouth 2 (two) times daily as needed for nausea or vomiting. (Patient not taking: Reported on 12/22/2018) 30 tablet 2   No current facility-administered medications for this visit.    Facility-Administered Medications Ordered in Other Visits  Medication Dose Route Frequency Provider Last Rate Last Dose   sodium chloride flush (NS) 0.9 % injection 10 mL  10 mL Intravenous PRN Lloyd Huger, MD   10 mL at 03/10/18 1200    OBJECTIVE: Vitals:   03/20/19 1018  BP: (!) 149/85  Pulse: (!) 105  Temp: 98.4 F (36.9 C)     Body mass index is 21.01 kg/m.    ECOG FS:0 - Asymptomatic  General: Well-developed, well-nourished, no acute distress. Eyes: Pink conjunctiva, anicteric  sclera. HEENT: Normocephalic, moist mucous membranes. Lungs: Clear to auscultation bilaterally. Heart: Regular rate and rhythm. No rubs, murmurs, or gallops. Abdomen: Soft, nontender, nondistended. No organomegaly noted, normoactive bowel sounds. Musculoskeletal: No edema, cyanosis, or clubbing. Neuro: Alert, answering all questions appropriately. Cranial nerves grossly intact. Skin: No rashes or petechiae noted. Psych: Normal affect.  LAB RESULTS:  Lab Results  Component Value Date   NA 130 (L) 03/16/2019   K 4.3 03/16/2019   CL 95 (L) 03/16/2019   CO2 24 03/16/2019   GLUCOSE 88 03/16/2019   BUN 22 03/16/2019   CREATININE 0.73 03/16/2019   CALCIUM 9.0 03/16/2019   PROT 7.1 03/16/2019   ALBUMIN 4.1 03/16/2019   AST 29 03/16/2019   ALT 15 03/16/2019   ALKPHOS 95 03/16/2019   BILITOT 0.6 03/16/2019  GFRNONAA >60 03/16/2019   GFRAA >60 03/16/2019    Lab Results  Component Value Date   WBC 4.8 03/16/2019   NEUTROABS 3.5 03/16/2019   HGB 10.4 (L) 03/16/2019   HCT 31.7 (L) 03/16/2019   MCV 94.1 03/16/2019   PLT 227 03/16/2019     STUDIES: Ct Abdomen Pelvis W Contrast  Result Date: 03/16/2019 CLINICAL DATA:  Ovarian cancer. EXAM: CT ABDOMEN AND PELVIS WITH CONTRAST TECHNIQUE: Multidetector CT imaging of the abdomen and pelvis was performed using the standard protocol following bolus administration of intravenous contrast. CONTRAST:  30mL OMNIPAQUE IOHEXOL 300 MG/ML  SOLN COMPARISON:  05/06/2018 FINDINGS: Lower chest: Moderate to large hiatal hernia. Tiny right lower lobe pulmonary nodule (image 6/series 4) is unchanged. Hepatobiliary: No suspicious focal abnormality within the liver parenchyma. 2.8 x 2.1 cm lesion on the capsule of the posteromedial right liver (20/2) is stable since prior when it measured 2.9 x 1.9 cm. There is no evidence for gallstones, gallbladder wall thickening, or pericholecystic fluid. No intrahepatic or extrahepatic biliary dilation. Pancreas: No  focal mass lesion. No dilatation of the main duct. No intraparenchymal cyst. No peripancreatic edema. Spleen: Calcified granulomata noted in the spleen. Adrenals/Urinary Tract: No adrenal nodule or mass. Kidneys unremarkable. No evidence for hydroureter. The urinary bladder appears normal for the degree of distention. Stomach/Bowel: Moderate to large hiatal hernia. Stomach otherwise unremarkable. Duodenum is normally positioned as is the ligament of Treitz. No small bowel wall thickening. No small bowel dilatation. The appendix is normal. No gross colonic mass. No colonic wall thickening. Vascular/Lymphatic: There is abdominal aortic atherosclerosis without aneurysm. There is no gastrohepatic or hepatoduodenal ligament lymphadenopathy. No intraperitoneal or retroperitoneal lymphadenopathy. No pelvic sidewall lymphadenopathy. Reproductive: Uterus surgically absent. Stable appearance 2.6 cm homogeneous cystic lesion in the left adnexal space. Other: 5.0 x 3.0 cm left omental mass identified as new on the previous exam has progressed in the interval measuring 5.2 x 7.3 cm today (30/2). Cystic and solid mass in the splenic hilum measures 4.7 x 4.4 cm today compared to 2.6 x 2.4 cm previously. Another lesion anterior to the spleen measures 4.2 x 3.8 cm on image 19/series 2 which compares to 1.5 x 1.2 cm previously. 3.2 x 2.5 cm lesion in the left mesentery (33/2) was 1.4 x 1.3 cm previously. Musculoskeletal: No worrisome lytic or sclerotic osseous abnormality. Thoracolumbar scoliosis evident. Changes of pelvic floor laxity evident. IMPRESSION: 1. Interval relatively marked progression of the omental and peritoneal metastases, as above. 2. Moderate to large hiatal hernia. 3. Stable tiny right lower lobe pulmonary nodule. Electronically Signed   By: Misty Stanley M.D.   On: 03/16/2019 12:42    ASSESSMENT: Stage IIIC ovarian cancer.  PLAN:    1. Stage IIIC ovarian cancer: CT scan results from March 16, 2019 reviewed  independently and reported as above with marked progression of omental and peritoneal metastasis.  Her Ca1 25 has also significantly increased and is now 145.0.  Patient wishes to continue to pursue treatment.  Because her recurrence is less than 6 months, will switch treatments to dose reduced single agent gemcitabine.  Plan to give treatment on days 1, 8, and 15 with a 22 off.  Although this regimen may need to be adjusted for cytopenias.  She wishes to keep her treatment on Thursdays, therefore she will return to clinic in March 29, 2021 initiate cycle 1, day 1.  2. Lupus anticoagulant: Patient was noted to have an elevated PTT as well as increased bleeding during  her surgery for rectal prolapse. Continue to monitor closely and repeat lupus anticoagulant panel if patient has evidence of bleeding or requires additional procedures.   3. Anemia: Hemoglobin improved to 10.4.  Previously, iron stores are within normal limits.  Continue to monitor closely.   4.  Leukopenia: Resolved.  I spent a total of 30 minutes face-to-face with the patient of which greater than 50% of the visit was spent in counseling and coordination of care as detailed above.   Patient expressed understanding and was in agreement with this plan. She also understands that She can call clinic at any time with any questions, concerns, or complaints.   Cancer Staging Malignant neoplasm of ovary [facility]) Staging form: Ovary, Fallopian Tube, and Primary Peritoneal Carcinoma, AJCC 8th Edition - Clinical stage from 05/29/2017: Stage IIIC (cT3c, cN1b, cM0) - Signed by Lloyd Huger, MD on 05/29/2017   Lloyd Huger, MD   03/20/2019 3:01 PM

## 2019-03-20 ENCOUNTER — Inpatient Hospital Stay: Payer: Medicare Other | Attending: Oncology | Admitting: Oncology

## 2019-03-20 ENCOUNTER — Encounter: Payer: Self-pay | Admitting: Oncology

## 2019-03-20 ENCOUNTER — Other Ambulatory Visit: Payer: Self-pay

## 2019-03-20 VITALS — BP 149/85 | HR 105 | Temp 98.4°F | Wt 98.8 lb

## 2019-03-20 DIAGNOSIS — I10 Essential (primary) hypertension: Secondary | ICD-10-CM | POA: Diagnosis not present

## 2019-03-20 DIAGNOSIS — M1990 Unspecified osteoarthritis, unspecified site: Secondary | ICD-10-CM | POA: Diagnosis not present

## 2019-03-20 DIAGNOSIS — Z801 Family history of malignant neoplasm of trachea, bronchus and lung: Secondary | ICD-10-CM | POA: Insufficient documentation

## 2019-03-20 DIAGNOSIS — K219 Gastro-esophageal reflux disease without esophagitis: Secondary | ICD-10-CM | POA: Diagnosis not present

## 2019-03-20 DIAGNOSIS — D649 Anemia, unspecified: Secondary | ICD-10-CM | POA: Diagnosis not present

## 2019-03-20 DIAGNOSIS — J45909 Unspecified asthma, uncomplicated: Secondary | ICD-10-CM | POA: Insufficient documentation

## 2019-03-20 DIAGNOSIS — K5900 Constipation, unspecified: Secondary | ICD-10-CM | POA: Diagnosis not present

## 2019-03-20 DIAGNOSIS — R140 Abdominal distension (gaseous): Secondary | ICD-10-CM | POA: Diagnosis not present

## 2019-03-20 DIAGNOSIS — D696 Thrombocytopenia, unspecified: Secondary | ICD-10-CM | POA: Diagnosis not present

## 2019-03-20 DIAGNOSIS — Z79899 Other long term (current) drug therapy: Secondary | ICD-10-CM | POA: Insufficient documentation

## 2019-03-20 DIAGNOSIS — R42 Dizziness and giddiness: Secondary | ICD-10-CM | POA: Insufficient documentation

## 2019-03-20 DIAGNOSIS — R791 Abnormal coagulation profile: Secondary | ICD-10-CM | POA: Insufficient documentation

## 2019-03-20 DIAGNOSIS — Z800 Family history of malignant neoplasm of digestive organs: Secondary | ICD-10-CM | POA: Diagnosis not present

## 2019-03-20 DIAGNOSIS — C569 Malignant neoplasm of unspecified ovary: Secondary | ICD-10-CM | POA: Diagnosis not present

## 2019-03-20 DIAGNOSIS — Z5111 Encounter for antineoplastic chemotherapy: Secondary | ICD-10-CM | POA: Insufficient documentation

## 2019-03-20 DIAGNOSIS — D6862 Lupus anticoagulant syndrome: Secondary | ICD-10-CM | POA: Diagnosis not present

## 2019-03-20 DIAGNOSIS — Z7951 Long term (current) use of inhaled steroids: Secondary | ICD-10-CM | POA: Insufficient documentation

## 2019-03-20 DIAGNOSIS — D72819 Decreased white blood cell count, unspecified: Secondary | ICD-10-CM | POA: Diagnosis not present

## 2019-03-20 DIAGNOSIS — C786 Secondary malignant neoplasm of retroperitoneum and peritoneum: Secondary | ICD-10-CM | POA: Diagnosis not present

## 2019-03-20 NOTE — Progress Notes (Signed)
DISCONTINUE OFF PATHWAY REGIMEN - Ovarian   OFF02212:Carboplatin AUC=6 D1 + Paclitaxel 80 mg/m2 Weekly (Dose Dense) q21 Days:   A cycle is every 21 days:     Paclitaxel      Carboplatin   **Always confirm dose/schedule in your pharmacy ordering system**  REASON: Disease Progression PRIOR TREATMENT: Off Pathway: Carboplatin AUC=6 D1 + Paclitaxel 80 mg/m2 Weekly (Dose Dense) q21 Days TREATMENT RESPONSE: Progressive Disease (PD)  START OFF PATHWAY REGIMEN - Ovarian   OFF00015:Gemcitabine 1,000 mg/m2 Days 1, 8, 15 q28 Days:   A cycle is every 28 days (3 weeks on and 1 week off):     Gemcitabine   **Always confirm dose/schedule in your pharmacy ordering system**  Patient Characteristics: Recurrent or Progressive Disease, Second Line Therapy, Progression on Neoadjuvant Therapy Therapeutic Status: Recurrent or Progressive Disease BRCA Mutation Status: Did Not Order Test Line of Therapy: Second Line  Intent of Therapy: Non-Curative / Palliative Intent, Discussed with Patient

## 2019-03-25 NOTE — Progress Notes (Signed)
[place]  [phone] [phone]) [phone]  ID: Tamara Mckinney OB: [birth year]  MR#: [id number]  AOZ#:[id number]  Patient Care Team: [name], MD as PCP - General (Family Medicine) [name], RN as Registered Nurse Herbert Pun, MD as Consulting Physician (General Surgery)  CHIEF COMPLAINT: Stage IIIC ovarian cancer.  INTERVAL HISTORY: Patient returns to clinic today for further evaluation and initiation of cycle 1, day 1 of single agent gemcitabine.  She continues to feel well and remains asymptomatic.  She has some mild abdominal bloating, but denies any pain.  She has no neurologic complaints.  She has good appetite and her weight has remained stable.  She denies any chest pain, shortness of breath, cough, or hemoptysis.  She denies any nausea, vomiting, or diarrhea.  She does admit to occasional constipation.  She has no urinary complaints.  Patient feels at her baseline offers no further specific complaints today.  REVIEW OF SYSTEMS:   Review of Systems  Constitutional: Negative.  Negative for fever, malaise/fatigue and weight loss.  Respiratory: Negative.  Negative for cough and shortness of breath.   Cardiovascular: Negative.  Negative for chest pain and leg swelling.  Gastrointestinal: Negative.  Negative for abdominal pain, blood in stool, constipation, diarrhea, heartburn, melena, nausea and vomiting.  Genitourinary: Negative.  Negative for dysuria.  Musculoskeletal: Negative.  Negative for back pain and neck pain.  Skin: Negative.  Negative for itching and rash.  Neurological: Negative.  Negative for dizziness, focal weakness and weakness.  Endo/Heme/Allergies: Does not bruise/bleed easily.  Psychiatric/Behavioral: Negative.  The patient is not nervous/anxious.     As per HPI. Otherwise, a complete review of systems is negative.  PAST MEDICAL HISTORY: Past Medical History:  Diagnosis Date   Anemia    Arthritis      Asthma    Cancer ([place])    Dyspnea    GERD (gastroesophageal reflux disease)    Hypertension     PAST SURGICAL HISTORY: Past Surgical History:  Procedure Laterality Date   ABDOMINAL HYSTERECTOMY     BREAST BIOPSY     x6   BUNIONECTOMY Bilateral    CATARACT EXTRACTION, BILATERAL     FLEXIBLE SIGMOIDOSCOPY N/A 05/21/2017   Procedure: FLEXIBLE SIGMOIDOSCOPY;  Surgeon: Lin Landsman, MD;  Location: [facility];  Service: Gastroenterology;  Laterality: N/A;   INCONTINENCE SURGERY     PORTA CATH INSERTION N/A 06/16/2017   Procedure: PORTA CATH INSERTION;  Surgeon: Algernon Huxley, MD;  Location: [place] [facility];  Service: Cardiovascular;  Laterality: N/A;   RECTAL SURGERY  04/2018   REPAIR OF RECTAL PROLAPSE N/A 05/11/2017   Procedure: REPAIR OF RECTAL PROLAPSE;  Surgeon: Leonie Green, MD;  Location: [facility];  Service: General;  Laterality: N/A;   TONSILLECTOMY     VAGINA SURGERY     uncertain procedure performed    FAMILY HISTORY: Family History  Problem Relation Age of Onset   Heart disease Mother    Heart disease Father    Heart disease Sister    Heart attack Sister    Ulcerative colitis Brother    Lung cancer Brother    Thyroid cancer Sister    Asthma Sister    Diabetes Sister    Asthma Sister    Pancreatic cancer Sister    Dementia Sister    Asthma Brother    Heart disease Brother    Asthma Brother    Lung cancer Brother    Asthma Brother  Lung cancer Brother    Lung cancer Brother    Rheum arthritis Brother     ADVANCED DIRECTIVES (Y/N):  N  HEALTH MAINTENANCE: Social History   Tobacco Use   Smoking status: Never Smoker   Smokeless tobacco: Never Used  Substance Use Topics   Alcohol use: No   Drug use: No     Colonoscopy:  PAP:  Bone density:  Lipid panel:  No Known Allergies  Current Outpatient Medications  Medication Sig Dispense Refill   albuterol (PROVENTIL HFA;VENTOLIN HFA)  108 (90 Base) MCG/ACT inhaler Inhale 1-2 puffs into the lungs every 6 (six) hours as needed for wheezing or shortness of breath.     benzonatate (TESSALON) 100 MG capsule Take 1 capsule by mouth 3 (three) times daily.     cyclobenzaprine (FLEXERIL) 10 MG tablet Take 1 tablet (10 mg total) by mouth 3 (three) times daily as needed for muscle spasms. 30 tablet 1   diphenhydrAMINE (BENADRYL) 25 MG tablet Take 25 mg by mouth every 6 (six) hours as needed.     esomeprazole (NEXIUM) 40 MG capsule Take 40 mg by mouth daily before breakfast.      fluticasone furoate-vilanterol (BREO ELLIPTA) 100-25 MCG/INH AEPB Inhale 1 puff into the lungs as needed.      ipratropium-albuterol (DUONEB) 0.5-2.5 (3) MG/3ML SOLN Take 3 mLs by nebulization every 6 (six) hours as needed. For wheezing/shortness of breath     lidocaine-prilocaine (EMLA) cream APPLY A SMALL AMOUNT TO PORT SITE AT LEAST 1 HOUR PRIOR TO IT BEING ACCESSED THEN COVER WITH PLASTIC WRAP 30 g 0   montelukast (SINGULAIR) 10 MG tablet Take 10 mg by mouth at bedtime.     polyethylene glycol (MIRALAX / GLYCOLAX) packet Take by mouth.     valsartan (DIOVAN) 160 MG tablet Start 1/2 tab a day.  Can increase to 1 tab after a few weeks if BP stays high     ondansetron (ZOFRAN) 8 MG tablet Take 1 tablet (8 mg total) by mouth 2 (two) times daily as needed for nausea or vomiting. (Patient not taking: Reported on 12/22/2018) 30 tablet 2   No current facility-administered medications for this visit.    Facility-Administered Medications Ordered in Other Visits  Medication Dose Route Frequency Provider Last Rate Last Dose   sodium chloride flush (NS) 0.9 % injection 10 mL  10 mL Intravenous PRN Lloyd Huger, MD   10 mL at 03/10/18 1200    OBJECTIVE: Vitals:   03/30/19 1126 03/30/19 1128  BP:  (!) 138/92  Pulse:  97  Resp: 18 18     Body mass index is 21.56 kg/m.    ECOG FS:0 - Asymptomatic  General: Well-developed, well-nourished, no acute  distress. Eyes: Pink conjunctiva, anicteric sclera. HEENT: Normocephalic, moist mucous membranes. Lungs: Clear to auscultation bilaterally. Heart: Regular rate and rhythm. No rubs, murmurs, or gallops. Abdomen: Soft, nontender, nondistended. No organomegaly noted, normoactive bowel sounds. Musculoskeletal: No edema, cyanosis, or clubbing. Neuro: Alert, answering all questions appropriately. Cranial nerves grossly intact. Skin: No rashes or petechiae noted. Psych: Normal affect.  LAB RESULTS:  Lab Results  Component Value Date   NA 132 (L) 03/30/2019   K 4.6 03/30/2019   CL 99 03/30/2019   CO2 24 03/30/2019   GLUCOSE 94 03/30/2019   BUN 32 (H) 03/30/2019   CREATININE 0.97 03/30/2019   CALCIUM 8.7 (L) 03/30/2019   PROT 6.6 03/30/2019   ALBUMIN 3.9 03/30/2019   AST 25 03/30/2019   ALT  13 03/30/2019   ALKPHOS 81 03/30/2019   BILITOT 0.6 03/30/2019   GFRNONAA 52 (L) 03/30/2019   GFRAA >60 03/30/2019    Lab Results  Component Value Date   WBC 6.0 03/30/2019   NEUTROABS 4.3 03/30/2019   HGB 9.8 (L) 03/30/2019   HCT 30.8 (L) 03/30/2019   MCV 93.6 03/30/2019   PLT 231 03/30/2019     STUDIES: Ct Abdomen Pelvis W Contrast  Result Date: 03/16/2019 CLINICAL DATA:  Ovarian cancer. EXAM: CT ABDOMEN AND PELVIS WITH CONTRAST TECHNIQUE: Multidetector CT imaging of the abdomen and pelvis was performed using the standard protocol following bolus administration of intravenous contrast. CONTRAST:  49mL OMNIPAQUE IOHEXOL 300 MG/ML  SOLN COMPARISON:  05/06/2018 FINDINGS: Lower chest: Moderate to large hiatal hernia. Tiny right lower lobe pulmonary nodule (image 6/series 4) is unchanged. Hepatobiliary: No suspicious focal abnormality within the liver parenchyma. 2.8 x 2.1 cm lesion on the capsule of the posteromedial right liver (20/2) is stable since prior when it measured 2.9 x 1.9 cm. There is no evidence for gallstones, gallbladder wall thickening, or pericholecystic fluid. No intrahepatic  or extrahepatic biliary dilation. Pancreas: No focal mass lesion. No dilatation of the main duct. No intraparenchymal cyst. No peripancreatic edema. Spleen: Calcified granulomata noted in the spleen. Adrenals/Urinary Tract: No adrenal nodule or mass. Kidneys unremarkable. No evidence for hydroureter. The urinary bladder appears normal for the degree of distention. Stomach/Bowel: Moderate to large hiatal hernia. Stomach otherwise unremarkable. Duodenum is normally positioned as is the ligament of Treitz. No small bowel wall thickening. No small bowel dilatation. The appendix is normal. No gross colonic mass. No colonic wall thickening. Vascular/Lymphatic: There is abdominal aortic atherosclerosis without aneurysm. There is no gastrohepatic or hepatoduodenal ligament lymphadenopathy. No intraperitoneal or retroperitoneal lymphadenopathy. No pelvic sidewall lymphadenopathy. Reproductive: Uterus surgically absent. Stable appearance 2.6 cm homogeneous cystic lesion in the left adnexal space. Other: 5.0 x 3.0 cm left omental mass identified as new on the previous exam has progressed in the interval measuring 5.2 x 7.3 cm today (30/2). Cystic and solid mass in the splenic hilum measures 4.7 x 4.4 cm today compared to 2.6 x 2.4 cm previously. Another lesion anterior to the spleen measures 4.2 x 3.8 cm on image 19/series 2 which compares to 1.5 x 1.2 cm previously. 3.2 x 2.5 cm lesion in the left mesentery (33/2) was 1.4 x 1.3 cm previously. Musculoskeletal: No worrisome lytic or sclerotic osseous abnormality. Thoracolumbar scoliosis evident. Changes of pelvic floor laxity evident. IMPRESSION: 1. Interval relatively marked progression of the omental and peritoneal metastases, as above. 2. Moderate to large hiatal hernia. 3. Stable tiny right lower lobe pulmonary nodule. Electronically Signed   By: Misty Stanley M.D.   On: 03/16/2019 12:42    ASSESSMENT: Stage IIIC ovarian cancer.  PLAN:    1. Stage IIIC ovarian  cancer: CT scan results from March 16, 2019 reviewed independently with marked progression of omental and peritoneal metastasis.  Her Ca1 25 has also significantly increased and is now 145.0.  Patient wishes to continue to pursue treatment.  Because her recurrence is less than 6 months, will switch treatments to dose reduced single agent gemcitabine.  Plan to give treatment on days 1, 8, and 15 with a 22 off.  Although this regimen may need to be adjusted for cytopenias.  Proceed with cycle 1, day 1 of gemcitabine today.  Return to clinic in 1 week for further evaluation and consideration of cycle 1, day 8.   2. Lupus  anticoagulant: Patient was noted to have an elevated PTT as well as increased bleeding during her surgery for rectal prolapse.   3. Anemia: Chronic and relatively unchanged.  Patient's hemoglobin is 9.8 today.  Previously, iron stores are within normal limits.  Continue to monitor closely.   4.  Leukopenia: Resolved.   Patient expressed understanding and was in agreement with this plan. She also understands that She can call clinic at any time with any questions, concerns, or complaints.   Cancer Staging Malignant neoplasm of ovary [facility]) Staging form: Ovary, Fallopian Tube, and Primary Peritoneal Carcinoma, AJCC 8th Edition - Clinical stage from 05/29/2017: Stage IIIC (cT3c, cN1b, cM0) - Signed by Lloyd Huger, MD on 05/29/2017   Lloyd Huger, MD   03/30/2019 7:18 PM

## 2019-03-29 ENCOUNTER — Other Ambulatory Visit: Payer: Self-pay

## 2019-03-30 ENCOUNTER — Inpatient Hospital Stay: Payer: Medicare Other

## 2019-03-30 ENCOUNTER — Inpatient Hospital Stay: Payer: Medicare Other | Admitting: *Deleted

## 2019-03-30 ENCOUNTER — Other Ambulatory Visit: Payer: Self-pay

## 2019-03-30 ENCOUNTER — Inpatient Hospital Stay (HOSPITAL_BASED_OUTPATIENT_CLINIC_OR_DEPARTMENT_OTHER): Payer: Medicare Other | Admitting: Oncology

## 2019-03-30 VITALS — BP 138/92 | HR 97 | Resp 18 | Wt 101.4 lb

## 2019-03-30 VITALS — BP 155/90 | HR 98 | Resp 19

## 2019-03-30 DIAGNOSIS — C569 Malignant neoplasm of unspecified ovary: Secondary | ICD-10-CM | POA: Diagnosis not present

## 2019-03-30 DIAGNOSIS — Z5111 Encounter for antineoplastic chemotherapy: Secondary | ICD-10-CM | POA: Diagnosis not present

## 2019-03-30 DIAGNOSIS — Z95828 Presence of other vascular implants and grafts: Secondary | ICD-10-CM

## 2019-03-30 LAB — CBC WITH DIFFERENTIAL/PLATELET
Abs Immature Granulocytes: 0.01 10*3/uL (ref 0.00–0.07)
Basophils Absolute: 0 10*3/uL (ref 0.0–0.1)
Basophils Relative: 1 %
Eosinophils Absolute: 0.1 10*3/uL (ref 0.0–0.5)
Eosinophils Relative: 1 %
HCT: 30.8 % — ABNORMAL LOW (ref 36.0–46.0)
Hemoglobin: 9.8 g/dL — ABNORMAL LOW (ref 12.0–15.0)
Immature Granulocytes: 0 %
Lymphocytes Relative: 15 %
Lymphs Abs: 0.9 10*3/uL (ref 0.7–4.0)
MCH: 29.8 pg (ref 26.0–34.0)
MCHC: 31.8 g/dL (ref 30.0–36.0)
MCV: 93.6 fL (ref 80.0–100.0)
Monocytes Absolute: 0.7 10*3/uL (ref 0.1–1.0)
Monocytes Relative: 11 %
Neutro Abs: 4.3 10*3/uL (ref 1.7–7.7)
Neutrophils Relative %: 72 %
Platelets: 231 10*3/uL (ref 150–400)
RBC: 3.29 MIL/uL — ABNORMAL LOW (ref 3.87–5.11)
RDW: 13.4 % (ref 11.5–15.5)
WBC: 6 10*3/uL (ref 4.0–10.5)
nRBC: 0 % (ref 0.0–0.2)

## 2019-03-30 LAB — COMPREHENSIVE METABOLIC PANEL
ALT: 13 U/L (ref 0–44)
AST: 25 U/L (ref 15–41)
Albumin: 3.9 g/dL (ref 3.5–5.0)
Alkaline Phosphatase: 81 U/L (ref 38–126)
Anion gap: 9 (ref 5–15)
BUN: 32 mg/dL — ABNORMAL HIGH (ref 8–23)
CO2: 24 mmol/L (ref 22–32)
Calcium: 8.7 mg/dL — ABNORMAL LOW (ref 8.9–10.3)
Chloride: 99 mmol/L (ref 98–111)
Creatinine, Ser: 0.97 mg/dL (ref 0.44–1.00)
GFR calc Af Amer: >60 mL/min (ref >60)
GFR calc non Af Amer: 52 mL/min — ABNORMAL LOW (ref >60)
Glucose, Bld: 94 mg/dL (ref 70–99)
Potassium: 4.6 mmol/L (ref 3.5–5.1)
Sodium: 132 mmol/L — ABNORMAL LOW (ref 135–145)
Total Bilirubin: 0.6 mg/dL (ref 0.3–1.2)
Total Protein: 6.6 g/dL (ref 6.5–8.1)

## 2019-03-30 MED ORDER — SODIUM CHLORIDE 0.9 % IV SOLN
Freq: Once | INTRAVENOUS | Status: AC
  Administered 2019-03-30: 12:00:00 via INTRAVENOUS
  Filled 2019-03-30: qty 250

## 2019-03-30 MED ORDER — PROCHLORPERAZINE MALEATE 10 MG PO TABS
10.0000 mg | ORAL_TABLET | Freq: Once | ORAL | Status: AC
  Administered 2019-03-30: 12:00:00 10 mg via ORAL
  Filled 2019-03-30: qty 1

## 2019-03-30 MED ORDER — HEPARIN SOD (PORK) LOCK FLUSH 100 UNIT/ML IV SOLN
500.0000 [IU] | Freq: Once | INTRAVENOUS | Status: AC | PRN
  Administered 2019-03-30: 500 [IU]
  Filled 2019-03-30: qty 5

## 2019-03-30 MED ORDER — SODIUM CHLORIDE 0.9% FLUSH
10.0000 mL | Freq: Once | INTRAVENOUS | Status: AC
  Administered 2019-03-30: 10 mL via INTRAVENOUS
  Filled 2019-03-30: qty 10

## 2019-03-30 MED ORDER — SODIUM CHLORIDE 0.9 % IV SOLN
1050.0000 mg | Freq: Once | INTRAVENOUS | Status: AC
  Administered 2019-03-30: 1064.6388 mg via INTRAVENOUS
  Filled 2019-03-30: qty 26.3

## 2019-03-30 NOTE — Progress Notes (Signed)
Patient denies any concerns today.  

## 2019-03-30 NOTE — Progress Notes (Signed)
Pt tolerated Gemcitabine infusion well with no signs of complications or reaction. RN made pt aware that if complications were to arise at home to call the clinic or 911 if it is an emergency. Pt verbalizes understanding and all questions answered at this time. VSS upon discharge.  Akaysha Cobern [employer]

## 2019-03-31 LAB — CA 125: Cancer Antigen (CA) 125: 130 U/mL — ABNORMAL HIGH (ref 0.0–38.1)

## 2019-03-31 NOTE — Progress Notes (Signed)
[name]  Telephone:(336) [phone] Fax:(336) [phone]  ID: Tamara Mckinney OB: [birth year]  MR#: [id number]  [id number]#:[id number]  Patient Care Team: [name], MD as PCP - General (Family Medicine) [name], RN as Registered Nurse Herbert Pun, MD as Consulting Physician (General Surgery)  CHIEF COMPLAINT: Stage IIIC ovarian cancer.  INTERVAL HISTORY: Patient returns to clinic today for further evaluation and consideration of cycle 1, day 8 of single agent gemcitabine.  She tolerated her first infusion well without significant side effects.  She continues to have mild abdominal bloating, but does not complain of pain.  She has no neurologic complaints.  She has good appetite and her weight has remained stable.  She denies any chest pain, shortness of breath, cough, or hemoptysis.  She denies any nausea, vomiting, or diarrhea.  She does admit to occasional constipation.  She has no urinary complaints.  Patient offers no further specific complaints today.  REVIEW OF SYSTEMS:   Review of Systems  Constitutional: Negative.  Negative for fever, malaise/fatigue and weight loss.  Respiratory: Negative.  Negative for cough and shortness of breath.   Cardiovascular: Negative.  Negative for chest pain and leg swelling.  Gastrointestinal: Negative.  Negative for abdominal pain, blood in stool, constipation, diarrhea, heartburn, melena, nausea and vomiting.  Genitourinary: Negative.  Negative for dysuria.  Musculoskeletal: Negative.  Negative for back pain and neck pain.  Skin: Negative.  Negative for itching and rash.  Neurological: Negative.  Negative for dizziness, focal weakness and weakness.  Endo/Heme/Allergies: Does not bruise/bleed easily.  Psychiatric/Behavioral: Positive for memory loss. The patient is not nervous/anxious.     As per HPI. Otherwise, a complete review of systems is negative.  PAST MEDICAL HISTORY: Past Medical History:  Diagnosis  Date   Anemia    Arthritis    Asthma    Cancer ([place])    Dyspnea    GERD (gastroesophageal reflux disease)    Hypertension     PAST SURGICAL HISTORY: Past Surgical History:  Procedure Laterality Date   ABDOMINAL HYSTERECTOMY     BREAST BIOPSY     x6   BUNIONECTOMY Bilateral    CATARACT EXTRACTION, BILATERAL     FLEXIBLE SIGMOIDOSCOPY N/A 05/21/2017   Procedure: FLEXIBLE SIGMOIDOSCOPY;  Surgeon: Lin Landsman, MD;  Location: [facility];  Service: Gastroenterology;  Laterality: N/A;   INCONTINENCE SURGERY     PORTA CATH INSERTION N/A 06/16/2017   Procedure: PORTA CATH INSERTION;  Surgeon: Algernon Huxley, MD;  Location: [place] [facility];  Service: Cardiovascular;  Laterality: N/A;   RECTAL SURGERY  04/2018   REPAIR OF RECTAL PROLAPSE N/A 05/11/2017   Procedure: REPAIR OF RECTAL PROLAPSE;  Surgeon: Leonie Green, MD;  Location: [facility];  Service: General;  Laterality: N/A;   TONSILLECTOMY     VAGINA SURGERY     uncertain procedure performed    FAMILY HISTORY: Family History  Problem Relation Age of Onset   Heart disease Mother    Heart disease Father    Heart disease Sister    Heart attack Sister    Ulcerative colitis Brother    Lung cancer Brother    Thyroid cancer Sister    Asthma Sister    Diabetes Sister    Asthma Sister    Pancreatic cancer Sister    Dementia Sister    Asthma Brother    Heart disease Brother    Asthma Brother    Lung cancer Brother    Asthma Brother  Lung cancer Brother    Lung cancer Brother    Rheum arthritis Brother     ADVANCED DIRECTIVES (Y/N):  N  HEALTH MAINTENANCE: Social History   Tobacco Use   Smoking status: Never Smoker   Smokeless tobacco: Never Used  Substance Use Topics   Alcohol use: No   Drug use: No     Colonoscopy:  PAP:  Bone density:  Lipid panel:  No Known Allergies  Current Outpatient Medications  Medication Sig Dispense Refill   albuterol  (PROVENTIL HFA;VENTOLIN HFA) 108 (90 Base) MCG/ACT inhaler Inhale 1-2 puffs into the lungs every 6 (six) hours as needed for wheezing or shortness of breath.     benzonatate (TESSALON) 100 MG capsule Take 1 capsule by mouth 3 (three) times daily.     cyclobenzaprine (FLEXERIL) 10 MG tablet Take 1 tablet (10 mg total) by mouth 3 (three) times daily as needed for muscle spasms. 30 tablet 1   diphenhydrAMINE (BENADRYL) 25 MG tablet Take 25 mg by mouth every 6 (six) hours as needed.     esomeprazole (NEXIUM) 40 MG capsule Take 40 mg by mouth daily before breakfast.      fluticasone furoate-vilanterol (BREO ELLIPTA) 100-25 MCG/INH AEPB Inhale 1 puff into the lungs as needed.      ipratropium-albuterol (DUONEB) 0.5-2.5 (3) MG/3ML SOLN Take 3 mLs by nebulization every 6 (six) hours as needed. For wheezing/shortness of breath     lidocaine-prilocaine (EMLA) cream APPLY A SMALL AMOUNT TO PORT SITE AT LEAST 1 HOUR PRIOR TO IT BEING ACCESSED THEN COVER WITH PLASTIC WRAP 30 g 0   montelukast (SINGULAIR) 10 MG tablet Take 10 mg by mouth at bedtime.     ondansetron (ZOFRAN) 8 MG tablet Take 1 tablet (8 mg total) by mouth 2 (two) times daily as needed for nausea or vomiting. 30 tablet 2   polyethylene glycol (MIRALAX / GLYCOLAX) packet Take by mouth.     valsartan (DIOVAN) 160 MG tablet Start 1/2 tab a day.  Can increase to 1 tab after a few weeks if BP stays high     No current facility-administered medications for this visit.    Facility-Administered Medications Ordered in Other Visits  Medication Dose Route Frequency Provider Last Rate Last Dose   gemcitabine (GEMZAR) 988.5932 mg in sodium chloride 0.9 % 250 mL chemo infusion  988.5932 mg Intravenous Once Lloyd Huger, MD       heparin lock flush 100 unit/mL  500 Units Intracatheter Once PRN Lloyd Huger, MD       sodium chloride flush (NS) 0.9 % injection 10 mL  10 mL Intravenous PRN Lloyd Huger, MD   10 mL at 03/10/18  1200    OBJECTIVE: Vitals:   04/06/19 0938  BP: (!) 147/81  Pulse: 100  Temp: (!) 97 F (36.1 C)     Body mass index is 23.18 kg/m.    ECOG FS:0 - Asymptomatic  General: Thin, no acute distress. Eyes: Pink conjunctiva, anicteric sclera. HEENT: Normocephalic, moist mucous membranes. Lungs: Clear to auscultation bilaterally. Heart: Regular rate and rhythm. No rubs, murmurs, or gallops. Abdomen: Soft, nontender, nondistended. No organomegaly noted, normoactive bowel sounds. Musculoskeletal: No edema, cyanosis, or clubbing. Neuro: Alert, answering all questions appropriately. Cranial nerves grossly intact. Skin: No rashes or petechiae noted. Psych: Normal affect.  LAB RESULTS:  Lab Results  Component Value Date   NA 131 (L) 04/06/2019   K 4.3 04/06/2019   CL 99 04/06/2019   CO2 24  04/06/2019   GLUCOSE 117 (H) 04/06/2019   BUN 26 (H) 04/06/2019   CREATININE 0.84 04/06/2019   CALCIUM 9.0 04/06/2019   PROT 7.3 04/06/2019   ALBUMIN 4.0 04/06/2019   AST 27 04/06/2019   ALT 13 04/06/2019   ALKPHOS 85 04/06/2019   BILITOT 0.5 04/06/2019   GFRNONAA >60 04/06/2019   GFRAA >60 04/06/2019    Lab Results  Component Value Date   WBC 3.4 (L) 04/06/2019   NEUTROABS 2.1 04/06/2019   HGB 9.9 (L) 04/06/2019   HCT 30.6 (L) 04/06/2019   MCV 92.4 04/06/2019   PLT 197 04/06/2019     STUDIES: Ct Abdomen Pelvis W Contrast  Result Date: 03/16/2019 CLINICAL DATA:  Ovarian cancer. EXAM: CT ABDOMEN AND PELVIS WITH CONTRAST TECHNIQUE: Multidetector CT imaging of the abdomen and pelvis was performed using the standard protocol following bolus administration of intravenous contrast. CONTRAST:  8mL OMNIPAQUE IOHEXOL 300 MG/ML  SOLN COMPARISON:  05/06/2018 FINDINGS: Lower chest: Moderate to large hiatal hernia. Tiny right lower lobe pulmonary nodule (image 6/series 4) is unchanged. Hepatobiliary: No suspicious focal abnormality within the liver parenchyma. 2.8 x 2.1 cm lesion on the capsule  of the posteromedial right liver (20/2) is stable since prior when it measured 2.9 x 1.9 cm. There is no evidence for gallstones, gallbladder wall thickening, or pericholecystic fluid. No intrahepatic or extrahepatic biliary dilation. Pancreas: No focal mass lesion. No dilatation of the main duct. No intraparenchymal cyst. No peripancreatic edema. Spleen: Calcified granulomata noted in the spleen. Adrenals/Urinary Tract: No adrenal nodule or mass. Kidneys unremarkable. No evidence for hydroureter. The urinary bladder appears normal for the degree of distention. Stomach/Bowel: Moderate to large hiatal hernia. Stomach otherwise unremarkable. Duodenum is normally positioned as is the ligament of Treitz. No small bowel wall thickening. No small bowel dilatation. The appendix is normal. No gross colonic mass. No colonic wall thickening. Vascular/Lymphatic: There is abdominal aortic atherosclerosis without aneurysm. There is no gastrohepatic or hepatoduodenal ligament lymphadenopathy. No intraperitoneal or retroperitoneal lymphadenopathy. No pelvic sidewall lymphadenopathy. Reproductive: Uterus surgically absent. Stable appearance 2.6 cm homogeneous cystic lesion in the left adnexal space. Other: 5.0 x 3.0 cm left omental mass identified as new on the previous exam has progressed in the interval measuring 5.2 x 7.3 cm today (30/2). Cystic and solid mass in the splenic hilum measures 4.7 x 4.4 cm today compared to 2.6 x 2.4 cm previously. Another lesion anterior to the spleen measures 4.2 x 3.8 cm on image 19/series 2 which compares to 1.5 x 1.2 cm previously. 3.2 x 2.5 cm lesion in the left mesentery (33/2) was 1.4 x 1.3 cm previously. Musculoskeletal: No worrisome lytic or sclerotic osseous abnormality. Thoracolumbar scoliosis evident. Changes of pelvic floor laxity evident. IMPRESSION: 1. Interval relatively marked progression of the omental and peritoneal metastases, as above. 2. Moderate to large hiatal hernia. 3.  Stable tiny right lower lobe pulmonary nodule. Electronically Signed   By: Misty Stanley M.D.   On: 03/16/2019 12:42    ASSESSMENT: Stage IIIC ovarian cancer.  PLAN:    1. Stage IIIC ovarian cancer: CT scan results from March 16, 2019 reviewed independently with marked progression of omental and peritoneal metastasis.  Her Ca-125 initially trended up to 145, but is now decreased to 130. Because her recurrence is less than 6 months, will switch treatments to dose reduced single agent gemcitabine.  Plan to give treatment on days 1, 8, and 15 with a 22 off.  Although this regimen may need to be  adjusted for cytopenias.  Proceed with cycle 1, day 8 of gemcitabine today.  Return to clinic in 1 week for further evaluation and consideration of cycle 1, day 15.   2. Lupus anticoagulant: Patient was noted to have an elevated PTT as well as increased bleeding during her surgery for rectal prolapse.   3. Anemia: Chronic and relatively unchanged.  Patient's hemoglobin is 9.9 today.  Previously, iron stores are within normal limits.  Continue to monitor closely.   4.  Leukopenia: Mild, monitor.   Patient expressed understanding and was in agreement with this plan. She also understands that She can call clinic at any time with any questions, concerns, or complaints.   Cancer Staging Malignant neoplasm of ovary [facility]) Staging form: Ovary, Fallopian Tube, and Primary Peritoneal Carcinoma, AJCC 8th Edition - Clinical stage from 05/29/2017: Stage IIIC (cT3c, cN1b, cM0) - Signed by Lloyd Huger, MD on 05/29/2017   Lloyd Huger, MD   04/06/2019 10:47 AM

## 2019-04-06 ENCOUNTER — Encounter: Payer: Self-pay | Admitting: Oncology

## 2019-04-06 ENCOUNTER — Inpatient Hospital Stay (HOSPITAL_BASED_OUTPATIENT_CLINIC_OR_DEPARTMENT_OTHER): Payer: Medicare Other | Admitting: Oncology

## 2019-04-06 ENCOUNTER — Other Ambulatory Visit: Payer: Self-pay

## 2019-04-06 ENCOUNTER — Inpatient Hospital Stay: Payer: Medicare Other

## 2019-04-06 VITALS — BP 147/81 | HR 100 | Temp 97.0°F | Wt 109.0 lb

## 2019-04-06 DIAGNOSIS — C569 Malignant neoplasm of unspecified ovary: Secondary | ICD-10-CM

## 2019-04-06 DIAGNOSIS — Z5111 Encounter for antineoplastic chemotherapy: Secondary | ICD-10-CM | POA: Diagnosis not present

## 2019-04-06 LAB — COMPREHENSIVE METABOLIC PANEL
ALT: 13 U/L (ref 0–44)
AST: 27 U/L (ref 15–41)
Albumin: 4 g/dL (ref 3.5–5.0)
Alkaline Phosphatase: 85 U/L (ref 38–126)
Anion gap: 8 (ref 5–15)
BUN: 26 mg/dL — ABNORMAL HIGH (ref 8–23)
CO2: 24 mmol/L (ref 22–32)
Calcium: 9 mg/dL (ref 8.9–10.3)
Chloride: 99 mmol/L (ref 98–111)
Creatinine, Ser: 0.84 mg/dL (ref 0.44–1.00)
GFR calc Af Amer: >60 mL/min (ref >60)
GFR calc non Af Amer: >60 mL/min (ref >60)
Glucose, Bld: 117 mg/dL — ABNORMAL HIGH (ref 70–99)
Potassium: 4.3 mmol/L (ref 3.5–5.1)
Sodium: 131 mmol/L — ABNORMAL LOW (ref 135–145)
Total Bilirubin: 0.5 mg/dL (ref 0.3–1.2)
Total Protein: 7.3 g/dL (ref 6.5–8.1)

## 2019-04-06 LAB — CBC WITH DIFFERENTIAL/PLATELET
Abs Immature Granulocytes: 0.01 10*3/uL (ref 0.00–0.07)
Basophils Absolute: 0 10*3/uL (ref 0.0–0.1)
Basophils Relative: 1 %
Eosinophils Absolute: 0.1 10*3/uL (ref 0.0–0.5)
Eosinophils Relative: 2 %
HCT: 30.6 % — ABNORMAL LOW (ref 36.0–46.0)
Hemoglobin: 9.9 g/dL — ABNORMAL LOW (ref 12.0–15.0)
Immature Granulocytes: 0 %
Lymphocytes Relative: 32 %
Lymphs Abs: 1.1 10*3/uL (ref 0.7–4.0)
MCH: 29.9 pg (ref 26.0–34.0)
MCHC: 32.4 g/dL (ref 30.0–36.0)
MCV: 92.4 fL (ref 80.0–100.0)
Monocytes Absolute: 0.1 10*3/uL (ref 0.1–1.0)
Monocytes Relative: 4 %
Neutro Abs: 2.1 10*3/uL (ref 1.7–7.7)
Neutrophils Relative %: 61 %
Platelets: 197 10*3/uL (ref 150–400)
RBC: 3.31 MIL/uL — ABNORMAL LOW (ref 3.87–5.11)
RDW: 13.2 % (ref 11.5–15.5)
WBC: 3.4 10*3/uL — ABNORMAL LOW (ref 4.0–10.5)
nRBC: 0 % (ref 0.0–0.2)

## 2019-04-06 MED ORDER — SODIUM CHLORIDE 0.9 % IV SOLN
Freq: Once | INTRAVENOUS | Status: AC
  Administered 2019-04-06: 10:00:00 via INTRAVENOUS
  Filled 2019-04-06: qty 250

## 2019-04-06 MED ORDER — SODIUM CHLORIDE 0.9% FLUSH
10.0000 mL | Freq: Once | INTRAVENOUS | Status: AC
  Administered 2019-04-06: 10 mL via INTRAVENOUS
  Filled 2019-04-06: qty 10

## 2019-04-06 MED ORDER — HEPARIN SOD (PORK) LOCK FLUSH 100 UNIT/ML IV SOLN
500.0000 [IU] | Freq: Once | INTRAVENOUS | Status: AC | PRN
  Administered 2019-04-06: 500 [IU]
  Filled 2019-04-06: qty 5

## 2019-04-06 MED ORDER — SODIUM CHLORIDE 0.9 % IV SOLN
1000.0000 mg | Freq: Once | INTRAVENOUS | Status: AC
  Administered 2019-04-06: 11:00:00 988.5932 mg via INTRAVENOUS
  Filled 2019-04-06: qty 26

## 2019-04-06 MED ORDER — PROCHLORPERAZINE MALEATE 10 MG PO TABS
10.0000 mg | ORAL_TABLET | Freq: Once | ORAL | Status: AC
  Administered 2019-04-06: 10:00:00 10 mg via ORAL
  Filled 2019-04-06: qty 1

## 2019-04-06 NOTE — Progress Notes (Signed)
Pt reports no concerns today. Here for follow up.

## 2019-04-07 LAB — CA 125: Cancer Antigen (CA) 125: 134 U/mL — ABNORMAL HIGH (ref 0.0–38.1)

## 2019-04-10 NOTE — Progress Notes (Signed)
[place]  Telephone:(336) [phone] Fax:(336) [phone]  ID: Tamara Mckinney OB: 17-Mar-1928  MR#: [id number]  [id number]  Patient Care Team: [name], MD as PCP - General (Family Medicine) [name], RN as Registered Nurse Herbert Pun, MD as Consulting Physician (General Surgery)  CHIEF COMPLAINT: Stage IIIC ovarian cancer.  INTERVAL HISTORY: Patient returns to clinic today for further evaluation and consideration of cycle 1, day 15 of gemcitabine.  She continues to tolerate her treatments well without significant side effects. She continues to have mild abdominal bloating, but does not complain of pain.  She has some mild dizziness, but no other neurologic complaints.  She has good appetite and her weight has remained stable.  She denies any chest pain, shortness of breath, cough, or hemoptysis.  She denies any nausea, vomiting, or diarrhea.  She does admit to occasional constipation.  She has no urinary complaints.  Patient offers no further specific complaints today.  REVIEW OF SYSTEMS:   Review of Systems  Constitutional: Negative.  Negative for fever, malaise/fatigue and weight loss.  Respiratory: Negative.  Negative for cough and shortness of breath.   Cardiovascular: Negative.  Negative for chest pain and leg swelling.  Gastrointestinal: Negative.  Negative for abdominal pain, blood in stool, constipation, diarrhea, heartburn, melena, nausea and vomiting.  Genitourinary: Negative.  Negative for dysuria.  Musculoskeletal: Negative.  Negative for back pain and neck pain.  Skin: Negative.  Negative for itching and rash.  Neurological: Negative.  Negative for dizziness, focal weakness and weakness.  Endo/Heme/Allergies: Does not bruise/bleed easily.  Psychiatric/Behavioral: Positive for memory loss. The patient is not nervous/anxious.     As per HPI. Otherwise, a complete review of systems is negative.  PAST MEDICAL HISTORY: Past  Medical History:  Diagnosis Date   Anemia    Arthritis    Asthma    Cancer ([place])    Dyspnea    GERD (gastroesophageal reflux disease)    Hypertension     PAST SURGICAL HISTORY: Past Surgical History:  Procedure Laterality Date   ABDOMINAL HYSTERECTOMY     BREAST BIOPSY     x6   BUNIONECTOMY Bilateral    CATARACT EXTRACTION, BILATERAL     FLEXIBLE SIGMOIDOSCOPY N/A 05/21/2017   Procedure: FLEXIBLE SIGMOIDOSCOPY;  Surgeon: Lin Landsman, MD;  Location: [facility];  Service: Gastroenterology;  Laterality: N/A;   INCONTINENCE SURGERY     PORTA CATH INSERTION N/A 06/16/2017   Procedure: PORTA CATH INSERTION;  Surgeon: Algernon Huxley, MD;  Location: [place] [facility];  Service: Cardiovascular;  Laterality: N/A;   RECTAL SURGERY  04/2018   REPAIR OF RECTAL PROLAPSE N/A 05/11/2017   Procedure: REPAIR OF RECTAL PROLAPSE;  Surgeon: Leonie Green, MD;  Location: [facility];  Service: General;  Laterality: N/A;   TONSILLECTOMY     VAGINA SURGERY     uncertain procedure performed    FAMILY HISTORY: Family History  Problem Relation Age of Onset   Heart disease Mother    Heart disease Father    Heart disease Sister    Heart attack Sister    Ulcerative colitis Brother    Lung cancer Brother    Thyroid cancer Sister    Asthma Sister    Diabetes Sister    Asthma Sister    Pancreatic cancer Sister    Dementia Sister    Asthma Brother    Heart disease Brother    Asthma Brother    Lung cancer Brother  Asthma Brother    Lung cancer Brother    Lung cancer Brother    Rheum arthritis Brother     ADVANCED DIRECTIVES (Y/N):  N  HEALTH MAINTENANCE: Social History   Tobacco Use   Smoking status: Never Smoker   Smokeless tobacco: Never Used  Substance Use Topics   Alcohol use: No   Drug use: No     Colonoscopy:  PAP:  Bone density:  Lipid panel:  No Known Allergies  Current Outpatient Medications  Medication Sig  Dispense Refill   albuterol (PROVENTIL HFA;VENTOLIN HFA) 108 (90 Base) MCG/ACT inhaler Inhale 1-2 puffs into the lungs every 6 (six) hours as needed for wheezing or shortness of breath.     benzonatate (TESSALON) 100 MG capsule Take 1 capsule by mouth 3 (three) times daily.     cyclobenzaprine (FLEXERIL) 10 MG tablet Take 1 tablet (10 mg total) by mouth 3 (three) times daily as needed for muscle spasms. 30 tablet 1   diphenhydrAMINE (BENADRYL) 25 MG tablet Take 25 mg by mouth every 6 (six) hours as needed.     esomeprazole (NEXIUM) 40 MG capsule Take 40 mg by mouth daily before breakfast.      fluticasone furoate-vilanterol (BREO ELLIPTA) 100-25 MCG/INH AEPB Inhale 1 puff into the lungs as needed.      ipratropium-albuterol (DUONEB) 0.5-2.5 (3) MG/3ML SOLN Take 3 mLs by nebulization every 6 (six) hours as needed. For wheezing/shortness of breath     lidocaine-prilocaine (EMLA) cream APPLY A SMALL AMOUNT TO PORT SITE AT LEAST 1 HOUR PRIOR TO IT BEING ACCESSED THEN COVER WITH PLASTIC WRAP 30 g 0   montelukast (SINGULAIR) 10 MG tablet Take 10 mg by mouth at bedtime.     ondansetron (ZOFRAN) 8 MG tablet Take 1 tablet (8 mg total) by mouth 2 (two) times daily as needed for nausea or vomiting. 30 tablet 2   polyethylene glycol (MIRALAX / GLYCOLAX) packet Take by mouth.     valsartan (DIOVAN) 160 MG tablet Start 1/2 tab a day.  Can increase to 1 tab after a few weeks if BP stays high     No current facility-administered medications for this visit.    Facility-Administered Medications Ordered in Other Visits  Medication Dose Route Frequency Provider Last Rate Last Dose   sodium chloride flush (NS) 0.9 % injection 10 mL  10 mL Intravenous PRN Lloyd Huger, MD   10 mL at 03/10/18 1200    OBJECTIVE: Vitals:   04/13/19 1000  BP: (!) 148/89  Pulse: 90  Resp: 18  Temp: (!) 97.3 F (36.3 C)     Body mass index is 21.69 kg/m.    ECOG FS:0 - Asymptomatic  General: Thin, no acute  distress. Eyes: Pink conjunctiva, anicteric sclera. HEENT: Normocephalic, moist mucous membranes. Lungs: Clear to auscultation bilaterally. Heart: Regular rate and rhythm. No rubs, murmurs, or gallops. Abdomen: Soft, nontender, nondistended. No organomegaly noted, normoactive bowel sounds. Musculoskeletal: No edema, cyanosis, or clubbing. Neuro: Alert, answering all questions appropriately. Cranial nerves grossly intact. Skin: No rashes or petechiae noted. Psych: Normal affect.  LAB RESULTS:  Lab Results  Component Value Date   NA 131 (L) 04/13/2019   K 4.4 04/13/2019   CL 99 04/13/2019   CO2 23 04/13/2019   GLUCOSE 121 (H) 04/13/2019   BUN 25 (H) 04/13/2019   CREATININE 0.99 04/13/2019   CALCIUM 8.8 (L) 04/13/2019   PROT 6.6 04/13/2019   ALBUMIN 3.8 04/13/2019   AST 24 04/13/2019  ALT 14 04/13/2019   ALKPHOS 86 04/13/2019   BILITOT 0.5 04/13/2019   GFRNONAA 51 (L) 04/13/2019   GFRAA 59 (L) 04/13/2019    Lab Results  Component Value Date   WBC 1.7 (L) 04/13/2019   NEUTROABS 0.9 (L) 04/13/2019   HGB 8.9 (L) 04/13/2019   HCT 27.1 (L) 04/13/2019   MCV 91.9 04/13/2019   PLT 112 (L) 04/13/2019     STUDIES: Ct Abdomen Pelvis W Contrast  Result Date: 03/16/2019 CLINICAL DATA:  Ovarian cancer. EXAM: CT ABDOMEN AND PELVIS WITH CONTRAST TECHNIQUE: Multidetector CT imaging of the abdomen and pelvis was performed using the standard protocol following bolus administration of intravenous contrast. CONTRAST:  16mL OMNIPAQUE IOHEXOL 300 MG/ML  SOLN COMPARISON:  05/06/2018 FINDINGS: Lower chest: Moderate to large hiatal hernia. Tiny right lower lobe pulmonary nodule (image 6/series 4) is unchanged. Hepatobiliary: No suspicious focal abnormality within the liver parenchyma. 2.8 x 2.1 cm lesion on the capsule of the posteromedial right liver (20/2) is stable since prior when it measured 2.9 x 1.9 cm. There is no evidence for gallstones, gallbladder wall thickening, or pericholecystic  fluid. No intrahepatic or extrahepatic biliary dilation. Pancreas: No focal mass lesion. No dilatation of the main duct. No intraparenchymal cyst. No peripancreatic edema. Spleen: Calcified granulomata noted in the spleen. Adrenals/Urinary Tract: No adrenal nodule or mass. Kidneys unremarkable. No evidence for hydroureter. The urinary bladder appears normal for the degree of distention. Stomach/Bowel: Moderate to large hiatal hernia. Stomach otherwise unremarkable. Duodenum is normally positioned as is the ligament of Treitz. No small bowel wall thickening. No small bowel dilatation. The appendix is normal. No gross colonic mass. No colonic wall thickening. Vascular/Lymphatic: There is abdominal aortic atherosclerosis without aneurysm. There is no gastrohepatic or hepatoduodenal ligament lymphadenopathy. No intraperitoneal or retroperitoneal lymphadenopathy. No pelvic sidewall lymphadenopathy. Reproductive: Uterus surgically absent. Stable appearance 2.6 cm homogeneous cystic lesion in the left adnexal space. Other: 5.0 x 3.0 cm left omental mass identified as new on the previous exam has progressed in the interval measuring 5.2 x 7.3 cm today (30/2). Cystic and solid mass in the splenic hilum measures 4.7 x 4.4 cm today compared to 2.6 x 2.4 cm previously. Another lesion anterior to the spleen measures 4.2 x 3.8 cm on image 19/series 2 which compares to 1.5 x 1.2 cm previously. 3.2 x 2.5 cm lesion in the left mesentery (33/2) was 1.4 x 1.3 cm previously. Musculoskeletal: No worrisome lytic or sclerotic osseous abnormality. Thoracolumbar scoliosis evident. Changes of pelvic floor laxity evident. IMPRESSION: 1. Interval relatively marked progression of the omental and peritoneal metastases, as above. 2. Moderate to large hiatal hernia. 3. Stable tiny right lower lobe pulmonary nodule. Electronically Signed   By: Misty Stanley M.D.   On: 03/16/2019 12:42    ASSESSMENT: Stage IIIC ovarian cancer.  PLAN:    1.  Stage IIIC ovarian cancer: CT scan results from March 16, 2019 reviewed independently with marked progression of omental and peritoneal metastasis.  Patient CA-125 is essentially stable at 134.  Because her recurrence is less than 6 months, will switch treatments to dose reduced single agent gemcitabine.  Plan to give treatment on days 1, 8, and 15 with a 22 off.  Although this regimen may need to be adjusted for cytopenias.  Delay cycle 1, day 15 of treatment secondary to neutropenia.  Can consider adding Udenyca in the future.  Return to clinic in 1 week for further evaluation and reconsideration of treatment.   2. Lupus anticoagulant:  Patient was noted to have an elevated PTT as well as increased bleeding during her surgery for rectal prolapse.   3. Anemia: Hemoglobin has trended down to 8.9.  Monitor.  Previously, iron stores are within normal limits.  Continue to monitor closely.   4.  Leukopenia: Delay treatment as above.  Consider Udenyca in the future. 5.  Thrombocytopenia: Mild, monitor. 6.  Dizziness: Patient was offered IV fluids today, but she declined.  Patient expressed understanding and was in agreement with this plan. She also understands that She can call clinic at any time with any questions, concerns, or complaints.   Cancer Staging Malignant neoplasm of ovary [facility]) Staging form: Ovary, Fallopian Tube, and Primary Peritoneal Carcinoma, AJCC 8th Edition - Clinical stage from 05/29/2017: Stage IIIC (cT3c, cN1b, cM0) - Signed by Lloyd Huger, MD on 05/29/2017   Lloyd Huger, MD   04/13/2019 6:41 PM

## 2019-04-13 ENCOUNTER — Encounter: Payer: Self-pay | Admitting: Oncology

## 2019-04-13 ENCOUNTER — Inpatient Hospital Stay: Payer: Medicare Other

## 2019-04-13 ENCOUNTER — Other Ambulatory Visit: Payer: Self-pay

## 2019-04-13 ENCOUNTER — Inpatient Hospital Stay (HOSPITAL_BASED_OUTPATIENT_CLINIC_OR_DEPARTMENT_OTHER): Payer: Medicare Other | Admitting: Oncology

## 2019-04-13 VITALS — BP 148/89 | HR 90 | Temp 97.3°F | Resp 18 | Wt 102.0 lb

## 2019-04-13 DIAGNOSIS — C569 Malignant neoplasm of unspecified ovary: Secondary | ICD-10-CM

## 2019-04-13 DIAGNOSIS — Z95828 Presence of other vascular implants and grafts: Secondary | ICD-10-CM

## 2019-04-13 DIAGNOSIS — Z5111 Encounter for antineoplastic chemotherapy: Secondary | ICD-10-CM | POA: Diagnosis not present

## 2019-04-13 LAB — CBC WITH DIFFERENTIAL/PLATELET
Abs Immature Granulocytes: 0 10*3/uL (ref 0.00–0.07)
Basophils Absolute: 0 10*3/uL (ref 0.0–0.1)
Basophils Relative: 1 %
Eosinophils Absolute: 0 10*3/uL (ref 0.0–0.5)
Eosinophils Relative: 1 %
HCT: 27.1 % — ABNORMAL LOW (ref 36.0–46.0)
Hemoglobin: 8.9 g/dL — ABNORMAL LOW (ref 12.0–15.0)
Immature Granulocytes: 0 %
Lymphocytes Relative: 41 %
Lymphs Abs: 0.7 10*3/uL (ref 0.7–4.0)
MCH: 30.2 pg (ref 26.0–34.0)
MCHC: 32.8 g/dL (ref 30.0–36.0)
MCV: 91.9 fL (ref 80.0–100.0)
Monocytes Absolute: 0.1 10*3/uL (ref 0.1–1.0)
Monocytes Relative: 4 %
Neutro Abs: 0.9 10*3/uL — ABNORMAL LOW (ref 1.7–7.7)
Neutrophils Relative %: 53 %
Platelets: 112 10*3/uL — ABNORMAL LOW (ref 150–400)
RBC: 2.95 MIL/uL — ABNORMAL LOW (ref 3.87–5.11)
RDW: 13.2 % (ref 11.5–15.5)
WBC: 1.7 10*3/uL — ABNORMAL LOW (ref 4.0–10.5)
nRBC: 0 % (ref 0.0–0.2)

## 2019-04-13 LAB — COMPREHENSIVE METABOLIC PANEL
ALT: 14 U/L (ref 0–44)
AST: 24 U/L (ref 15–41)
Albumin: 3.8 g/dL (ref 3.5–5.0)
Alkaline Phosphatase: 86 U/L (ref 38–126)
Anion gap: 9 (ref 5–15)
BUN: 25 mg/dL — ABNORMAL HIGH (ref 8–23)
CO2: 23 mmol/L (ref 22–32)
Calcium: 8.8 mg/dL — ABNORMAL LOW (ref 8.9–10.3)
Chloride: 99 mmol/L (ref 98–111)
Creatinine, Ser: 0.99 mg/dL (ref 0.44–1.00)
GFR calc Af Amer: 59 mL/min — ABNORMAL LOW (ref >60)
GFR calc non Af Amer: 51 mL/min — ABNORMAL LOW (ref >60)
Glucose, Bld: 121 mg/dL — ABNORMAL HIGH (ref 70–99)
Potassium: 4.4 mmol/L (ref 3.5–5.1)
Sodium: 131 mmol/L — ABNORMAL LOW (ref 135–145)
Total Bilirubin: 0.5 mg/dL (ref 0.3–1.2)
Total Protein: 6.6 g/dL (ref 6.5–8.1)

## 2019-04-13 MED ORDER — SODIUM CHLORIDE 0.9% FLUSH
10.0000 mL | Freq: Once | INTRAVENOUS | Status: AC
  Administered 2019-04-13: 10 mL via INTRAVENOUS
  Filled 2019-04-13: qty 10

## 2019-04-13 MED ORDER — HEPARIN SOD (PORK) LOCK FLUSH 100 UNIT/ML IV SOLN
500.0000 [IU] | Freq: Once | INTRAVENOUS | Status: AC
  Administered 2019-04-13: 500 [IU] via INTRAVENOUS
  Filled 2019-04-13: qty 5

## 2019-04-13 NOTE — Progress Notes (Signed)
Patient reports dizziness today.

## 2019-04-14 LAB — CA 125: Cancer Antigen (CA) 125: 126 U/mL — ABNORMAL HIGH (ref 0.0–38.1)

## 2019-04-15 ENCOUNTER — Other Ambulatory Visit: Payer: Self-pay | Admitting: Oncology

## 2019-04-15 NOTE — Progress Notes (Signed)
[place]  [phone] Fax:(336) [phone]  ID: Tamara Mckinney OB: 04/26/1928  MR#: [id number]  [id number]  Patient Care Team: [name], MD as PCP - General (Family Medicine) [name], RN as Registered Nurse Herbert Pun, MD as Consulting Physician (General Surgery)  CHIEF COMPLAINT: Stage IIIC ovarian cancer.  INTERVAL HISTORY: Patient returns to clinic today for further evaluation and reconsideration of cycle 1, day 15 of single agent gemcitabine.  She currently feels well and is asymptomatic.  She is tolerating her treatments without significant side effects. She continues to have mild abdominal bloating, but does not complain of pain.  She has no neurologic complaints.  She has good appetite and her weight has remained stable.  She denies any chest pain, shortness of breath, cough, or hemoptysis.  She denies any nausea, vomiting, or diarrhea.  She does admit to occasional constipation.  She has no urinary complaints.  Patient offers no further specific complaints today.  REVIEW OF SYSTEMS:   Review of Systems  Constitutional: Negative.  Negative for fever, malaise/fatigue and weight loss.  Respiratory: Negative.  Negative for cough and shortness of breath.   Cardiovascular: Negative.  Negative for chest pain and leg swelling.  Gastrointestinal: Negative.  Negative for abdominal pain, blood in stool, constipation, diarrhea, heartburn, melena, nausea and vomiting.  Genitourinary: Negative.  Negative for dysuria.  Musculoskeletal: Negative.  Negative for back pain and neck pain.  Skin: Negative.  Negative for itching and rash.  Neurological: Negative.  Negative for dizziness, focal weakness and weakness.  Endo/Heme/Allergies: Does not bruise/bleed easily.  Psychiatric/Behavioral: Positive for memory loss. The patient is not nervous/anxious.     As per HPI. Otherwise, a complete review of systems is negative.  PAST MEDICAL  HISTORY: Past Medical History:  Diagnosis Date  . Anemia   . Arthritis   . Asthma   . Cancer ([place])   . Dyspnea   . GERD (gastroesophageal reflux disease)   . Hypertension     PAST SURGICAL HISTORY: Past Surgical History:  Procedure Laterality Date  . ABDOMINAL HYSTERECTOMY    . BREAST BIOPSY     x6  . BUNIONECTOMY Bilateral   . CATARACT EXTRACTION, BILATERAL    . FLEXIBLE SIGMOIDOSCOPY N/A 05/21/2017   Procedure: FLEXIBLE SIGMOIDOSCOPY;  Surgeon: Lin Landsman, MD;  [facility] ENDOSCOPY;  Service: Gastroenterology;  Laterality: N/A;  . INCONTINENCE SURGERY    . PORTA CATH INSERTION N/A 06/16/2017   Procedure: PORTA CATH INSERTION;  Surgeon: Algernon Huxley, MD;  Location: [place] CV LAB;  Service: Cardiovascular;  Laterality: N/A;  . RECTAL SURGERY  04/2018  . REPAIR OF RECTAL PROLAPSE N/A 05/11/2017   Procedure: REPAIR OF RECTAL PROLAPSE;  Surgeon: Leonie Green, MD;  Location: [facility] ORS;  Service: General;  Laterality: N/A;  . TONSILLECTOMY    . VAGINA SURGERY     uncertain procedure performed    FAMILY HISTORY: Family History  Problem Relation Age of Onset  . Heart disease Mother   . Heart disease Father   . Heart disease Sister   . Heart attack Sister   . Ulcerative colitis Brother   . Lung cancer Brother   . Thyroid cancer Sister   . Asthma Sister   . Diabetes Sister   . Asthma Sister   . Pancreatic cancer Sister   . Dementia Sister   . Asthma Brother   . Heart disease Brother   . Asthma Brother   . Lung cancer Brother   .  Asthma Brother   . Lung cancer Brother   . Lung cancer Brother   . Rheum arthritis Brother     ADVANCED DIRECTIVES (Y/N):  N  HEALTH MAINTENANCE: Social History   Tobacco Use  . Smoking status: Never Smoker  . Smokeless tobacco: Never Used  Substance Use Topics  . Alcohol use: No  . Drug use: No     Colonoscopy:  PAP:  Bone density:  Lipid panel:  No Known Allergies  Current Outpatient Medications   Medication Sig Dispense Refill  . acetaminophen (TYLENOL) 500 MG tablet Take 500 mg by mouth every 6 (six) hours as needed.    Marland Kitchen esomeprazole (NEXIUM) 40 MG capsule Take 40 mg by mouth daily before breakfast.     . ipratropium-albuterol (DUONEB) 0.5-2.5 (3) MG/3ML SOLN Take 3 mLs by nebulization every 6 (six) hours as needed. For wheezing/shortness of breath    . lidocaine-prilocaine (EMLA) cream APPLY A SMALL AMOUNT TO PORT SITE AT LEAST 1 HOUR PRIOR TO IT BEING ACCESSED THEN COVER WITH PLASTIC WRAP 30 g 0  . montelukast (SINGULAIR) 10 MG tablet Take 10 mg by mouth at bedtime.    . valsartan (DIOVAN) 160 MG tablet Start 1/2 tab a day.  Can increase to 1 tab after a few weeks if BP stays high    . albuterol (PROVENTIL HFA;VENTOLIN HFA) 108 (90 Base) MCG/ACT inhaler Inhale 1-2 puffs into the lungs every 6 (six) hours as needed for wheezing or shortness of breath.    . benzonatate (TESSALON) 100 MG capsule Take 1 capsule by mouth 3 (three) times daily.    . cyclobenzaprine (FLEXERIL) 10 MG tablet Take 1 tablet (10 mg total) by mouth 3 (three) times daily as needed for muscle spasms. (Patient not taking: Reported on 04/19/2019) 30 tablet 1  . diphenhydrAMINE (BENADRYL) 25 MG tablet Take 25 mg by mouth every 6 (six) hours as needed.    . fluticasone furoate-vilanterol (BREO ELLIPTA) 100-25 MCG/INH AEPB Inhale 1 puff into the lungs as needed.     . ondansetron (ZOFRAN) 8 MG tablet Take 1 tablet (8 mg total) by mouth 2 (two) times daily as needed for nausea or vomiting. (Patient not taking: Reported on 04/19/2019) 30 tablet 2  . polyethylene glycol (MIRALAX / GLYCOLAX) packet Take by mouth.     No current facility-administered medications for this visit.    Facility-Administered Medications Ordered in Other Visits  Medication Dose Route Frequency Provider Last Rate Last Dose  . heparin lock flush 100 unit/mL  500 Units Intracatheter Once PRN Lloyd Huger, MD      . sodium chloride flush (NS) 0.9 %  injection 10 mL  10 mL Intravenous PRN Lloyd Huger, MD   10 mL at 03/10/18 1200  . sodium chloride flush (NS) 0.9 % injection 10 mL  10 mL Intravenous PRN Lloyd Huger, MD   10 mL at 04/20/19 0931    OBJECTIVE: Vitals:   04/20/19 0944  BP: 138/81  Pulse: 93     Body mass index is 21.41 kg/m.    ECOG FS:0 - Asymptomatic  General: Thin, no acute distress. Eyes: Pink conjunctiva, anicteric sclera. HEENT: Normocephalic, moist mucous membranes. Lungs: Clear to auscultation bilaterally. Heart: Regular rate and rhythm. No rubs, murmurs, or gallops. Abdomen: Soft, nontender, nondistended. No organomegaly noted, normoactive bowel sounds. Musculoskeletal: No edema, cyanosis, or clubbing. Neuro: Alert, answering all questions appropriately. Cranial nerves grossly intact. Skin: No rashes or petechiae noted. Psych: Normal affect.  LAB  RESULTS:  Lab Results  Component Value Date   NA 130 (L) 04/20/2019   K 4.5 04/20/2019   CL 99 04/20/2019   CO2 23 04/20/2019   GLUCOSE 127 (H) 04/20/2019   BUN 26 (H) 04/20/2019   CREATININE 0.92 04/20/2019   CALCIUM 8.8 (L) 04/20/2019   PROT 6.7 04/20/2019   ALBUMIN 4.0 04/20/2019   AST 25 04/20/2019   ALT 14 04/20/2019   ALKPHOS 82 04/20/2019   BILITOT 0.4 04/20/2019   GFRNONAA 55 (L) 04/20/2019   GFRAA >60 04/20/2019    Lab Results  Component Value Date   WBC 3.1 (L) 04/20/2019   NEUTROABS 1.5 (L) 04/20/2019   HGB 9.5 (L) 04/20/2019   HCT 29.4 (L) 04/20/2019   MCV 93.0 04/20/2019   PLT 492 (H) 04/20/2019     STUDIES: No results found.  ASSESSMENT: Stage IIIC ovarian cancer.  PLAN:    1. Stage IIIC ovarian cancer: CT scan results from March 16, 2019 reviewed independently with marked progression of omental and peritoneal metastasis.  Patient CA-125 has trended down slightly to 126. Because her recurrence is less than 6 months, will switch treatments to dose reduced single agent gemcitabine.  Plan to give treatment on  days 1, 8, and 15 with a 22 off.  Although this regimen may need to be adjusted for cytopenias.  Proceed with cycle 1, day 15 today.  Can consider adding Udenyca in the future.  Return to clinic in 2 weeks for further evaluation and consideration of cycle 2, day 1. 2. Lupus anticoagulant: Patient was noted to have an elevated PTT as well as increased bleeding during her surgery for rectal prolapse.   3. Anemia: Patient's hemoglobin has trended up to 9.5, monitor.  Previously, iron stores are within normal limits.  Continue to monitor closely.   4.  Leukopenia: Improved.  Proceed with treatment as above.  Consider Udenyca in the future. 5.  Thrombocytopenia: Resolved. 6.  Dizziness: Patient does not complain of this today.  Patient expressed understanding and was in agreement with this plan. She also understands that She can call clinic at any time with any questions, concerns, or complaints.   Cancer Staging Malignant neoplasm of ovary [facility]) Staging form: Ovary, Fallopian Tube, and Primary Peritoneal Carcinoma, AJCC 8th Edition - Clinical stage from 05/29/2017: Stage IIIC (cT3c, cN1b, cM0) - Signed by Lloyd Huger, MD on 05/29/2017   Lloyd Huger, MD   04/20/2019 11:22 AM

## 2019-04-19 ENCOUNTER — Encounter: Payer: Self-pay | Admitting: Oncology

## 2019-04-19 ENCOUNTER — Other Ambulatory Visit: Payer: Self-pay

## 2019-04-19 NOTE — Progress Notes (Signed)
Patient stated that she had been doing well with no complaints. 

## 2019-04-20 ENCOUNTER — Inpatient Hospital Stay (HOSPITAL_BASED_OUTPATIENT_CLINIC_OR_DEPARTMENT_OTHER): Payer: Medicare Other | Admitting: Oncology

## 2019-04-20 ENCOUNTER — Inpatient Hospital Stay: Payer: Medicare Other

## 2019-04-20 ENCOUNTER — Inpatient Hospital Stay: Payer: Medicare Other | Attending: Oncology

## 2019-04-20 ENCOUNTER — Other Ambulatory Visit: Payer: Self-pay

## 2019-04-20 VITALS — Temp 97.0°F

## 2019-04-20 VITALS — BP 138/81 | HR 93 | Wt 100.7 lb

## 2019-04-20 DIAGNOSIS — I10 Essential (primary) hypertension: Secondary | ICD-10-CM | POA: Insufficient documentation

## 2019-04-20 DIAGNOSIS — Z7951 Long term (current) use of inhaled steroids: Secondary | ICD-10-CM | POA: Insufficient documentation

## 2019-04-20 DIAGNOSIS — J45909 Unspecified asthma, uncomplicated: Secondary | ICD-10-CM | POA: Diagnosis not present

## 2019-04-20 DIAGNOSIS — R140 Abdominal distension (gaseous): Secondary | ICD-10-CM | POA: Diagnosis not present

## 2019-04-20 DIAGNOSIS — D72819 Decreased white blood cell count, unspecified: Secondary | ICD-10-CM | POA: Diagnosis not present

## 2019-04-20 DIAGNOSIS — K5900 Constipation, unspecified: Secondary | ICD-10-CM | POA: Insufficient documentation

## 2019-04-20 DIAGNOSIS — Z5111 Encounter for antineoplastic chemotherapy: Secondary | ICD-10-CM | POA: Insufficient documentation

## 2019-04-20 DIAGNOSIS — C786 Secondary malignant neoplasm of retroperitoneum and peritoneum: Secondary | ICD-10-CM | POA: Insufficient documentation

## 2019-04-20 DIAGNOSIS — M1990 Unspecified osteoarthritis, unspecified site: Secondary | ICD-10-CM | POA: Diagnosis not present

## 2019-04-20 DIAGNOSIS — R42 Dizziness and giddiness: Secondary | ICD-10-CM | POA: Diagnosis not present

## 2019-04-20 DIAGNOSIS — Z90710 Acquired absence of both cervix and uterus: Secondary | ICD-10-CM | POA: Diagnosis not present

## 2019-04-20 DIAGNOSIS — C569 Malignant neoplasm of unspecified ovary: Secondary | ICD-10-CM | POA: Diagnosis present

## 2019-04-20 DIAGNOSIS — K219 Gastro-esophageal reflux disease without esophagitis: Secondary | ICD-10-CM | POA: Diagnosis not present

## 2019-04-20 DIAGNOSIS — D649 Anemia, unspecified: Secondary | ICD-10-CM | POA: Insufficient documentation

## 2019-04-20 DIAGNOSIS — Z79899 Other long term (current) drug therapy: Secondary | ICD-10-CM | POA: Insufficient documentation

## 2019-04-20 DIAGNOSIS — D6862 Lupus anticoagulant syndrome: Secondary | ICD-10-CM | POA: Diagnosis not present

## 2019-04-20 DIAGNOSIS — R197 Diarrhea, unspecified: Secondary | ICD-10-CM | POA: Diagnosis not present

## 2019-04-20 LAB — CBC WITH DIFFERENTIAL/PLATELET
Abs Immature Granulocytes: 0.01 10*3/uL (ref 0.00–0.07)
Basophils Absolute: 0 10*3/uL (ref 0.0–0.1)
Basophils Relative: 1 %
Eosinophils Absolute: 0 10*3/uL (ref 0.0–0.5)
Eosinophils Relative: 1 %
HCT: 29.4 % — ABNORMAL LOW (ref 36.0–46.0)
Hemoglobin: 9.5 g/dL — ABNORMAL LOW (ref 12.0–15.0)
Immature Granulocytes: 0 %
Lymphocytes Relative: 34 %
Lymphs Abs: 1.1 10*3/uL (ref 0.7–4.0)
MCH: 30.1 pg (ref 26.0–34.0)
MCHC: 32.3 g/dL (ref 30.0–36.0)
MCV: 93 fL (ref 80.0–100.0)
Monocytes Absolute: 0.5 10*3/uL (ref 0.1–1.0)
Monocytes Relative: 15 %
Neutro Abs: 1.5 10*3/uL — ABNORMAL LOW (ref 1.7–7.7)
Neutrophils Relative %: 49 %
Platelets: 492 10*3/uL — ABNORMAL HIGH (ref 150–400)
RBC: 3.16 MIL/uL — ABNORMAL LOW (ref 3.87–5.11)
RDW: 14.5 % (ref 11.5–15.5)
WBC: 3.1 10*3/uL — ABNORMAL LOW (ref 4.0–10.5)
nRBC: 0 % (ref 0.0–0.2)

## 2019-04-20 LAB — COMPREHENSIVE METABOLIC PANEL
ALT: 14 U/L (ref 0–44)
AST: 25 U/L (ref 15–41)
Albumin: 4 g/dL (ref 3.5–5.0)
Alkaline Phosphatase: 82 U/L (ref 38–126)
Anion gap: 8 (ref 5–15)
BUN: 26 mg/dL — ABNORMAL HIGH (ref 8–23)
CO2: 23 mmol/L (ref 22–32)
Calcium: 8.8 mg/dL — ABNORMAL LOW (ref 8.9–10.3)
Chloride: 99 mmol/L (ref 98–111)
Creatinine, Ser: 0.92 mg/dL (ref 0.44–1.00)
GFR calc Af Amer: >60 mL/min (ref >60)
GFR calc non Af Amer: 55 mL/min — ABNORMAL LOW (ref >60)
Glucose, Bld: 127 mg/dL — ABNORMAL HIGH (ref 70–99)
Potassium: 4.5 mmol/L (ref 3.5–5.1)
Sodium: 130 mmol/L — ABNORMAL LOW (ref 135–145)
Total Bilirubin: 0.4 mg/dL (ref 0.3–1.2)
Total Protein: 6.7 g/dL (ref 6.5–8.1)

## 2019-04-20 MED ORDER — PROCHLORPERAZINE MALEATE 10 MG PO TABS
10.0000 mg | ORAL_TABLET | Freq: Once | ORAL | Status: AC
  Administered 2019-04-20: 10 mg via ORAL
  Filled 2019-04-20: qty 1

## 2019-04-20 MED ORDER — SODIUM CHLORIDE 0.9 % IV SOLN
800.0000 mg/m2 | Freq: Once | INTRAVENOUS | Status: AC
  Administered 2019-04-20: 1064 mg via INTRAVENOUS
  Filled 2019-04-20: qty 26.3

## 2019-04-20 MED ORDER — SODIUM CHLORIDE 0.9% FLUSH
10.0000 mL | INTRAVENOUS | Status: DC | PRN
  Administered 2019-04-20: 10 mL via INTRAVENOUS
  Filled 2019-04-20: qty 10

## 2019-04-20 MED ORDER — HEPARIN SOD (PORK) LOCK FLUSH 100 UNIT/ML IV SOLN
500.0000 [IU] | Freq: Once | INTRAVENOUS | Status: AC
  Administered 2019-04-20: 500 [IU] via INTRAVENOUS

## 2019-04-20 MED ORDER — SODIUM CHLORIDE 0.9 % IV SOLN
Freq: Once | INTRAVENOUS | Status: AC
  Administered 2019-04-20: 10:00:00 via INTRAVENOUS
  Filled 2019-04-20: qty 250

## 2019-04-20 MED ORDER — HEPARIN SOD (PORK) LOCK FLUSH 100 UNIT/ML IV SOLN
500.0000 [IU] | Freq: Once | INTRAVENOUS | Status: AC | PRN
  Administered 2019-04-20: 500 [IU]
  Filled 2019-04-20: qty 5

## 2019-04-21 LAB — CA 125: Cancer Antigen (CA) 125: 86.3 U/mL — ABNORMAL HIGH (ref 0.0–38.1)

## 2019-05-04 NOTE — Progress Notes (Signed)
[place]  [phone] Fax:(336) [phone]  ID: Tamara Mckinney OB: 1928/04/20  MR#: [id number]  [id number]  Patient Care Team: [name], MD as PCP - General (Family Medicine) [name], RN as Registered Nurse Herbert Pun, MD as Consulting Physician (General Surgery)  CHIEF COMPLAINT: Stage IIIC ovarian cancer.  INTERVAL HISTORY: Patient returns to clinic today for further evaluation and consideration of cycle 2, day 1 of single agent gemcitabine.  She continues to have mild bloating, but otherwise feels well.  She has no neurologic complaints.  She has good appetite and her weight has remained stable.  She denies any chest pain, shortness of breath, cough, or hemoptysis.  She denies any nausea, vomiting, constipation.  She continues to have occasional diarrhea residual from her surgery many years ago.  She has no urinary complaints.  Patient offers no further specific complaints today.  REVIEW OF SYSTEMS:   Review of Systems  Constitutional: Negative.  Negative for fever, malaise/fatigue and weight loss.  Respiratory: Negative.  Negative for cough and shortness of breath.   Cardiovascular: Negative.  Negative for chest pain and leg swelling.  Gastrointestinal: Negative.  Negative for abdominal pain, blood in stool, constipation, diarrhea, heartburn, melena, nausea and vomiting.  Genitourinary: Negative.  Negative for dysuria.  Musculoskeletal: Negative.  Negative for back pain and neck pain.  Skin: Negative.  Negative for itching and rash.  Neurological: Negative.  Negative for dizziness, focal weakness and weakness.  Endo/Heme/Allergies: Does not bruise/bleed easily.  Psychiatric/Behavioral: Positive for memory loss. The patient is not nervous/anxious.     As per HPI. Otherwise, a complete review of systems is negative.  PAST MEDICAL HISTORY: Past Medical History:  Diagnosis Date  . Anemia   . Arthritis   . Asthma    . Cancer ([place])   . Dyspnea   . GERD (gastroesophageal reflux disease)   . Hypertension     PAST SURGICAL HISTORY: Past Surgical History:  Procedure Laterality Date  . ABDOMINAL HYSTERECTOMY    . BREAST BIOPSY     x6  . BUNIONECTOMY Bilateral   . CATARACT EXTRACTION, BILATERAL    . FLEXIBLE SIGMOIDOSCOPY N/A 05/21/2017   Procedure: FLEXIBLE SIGMOIDOSCOPY;  Surgeon: Lin Landsman, MD;  Location: [facility] ENDOSCOPY;  Service: Gastroenterology;  Laterality: N/A;  . INCONTINENCE SURGERY    . PORTA CATH INSERTION N/A 06/16/2017   Procedure: PORTA CATH INSERTION;  Surgeon: Algernon Huxley, MD;  Location: [place] CV LAB;  Service: Cardiovascular;  Laterality: N/A;  . RECTAL SURGERY  04/2018  . REPAIR OF RECTAL PROLAPSE N/A 05/11/2017   Procedure: REPAIR OF RECTAL PROLAPSE;  Surgeon: Leonie Green, MD;  Location: [facility] ORS;  Service: General;  Laterality: N/A;  . TONSILLECTOMY    . VAGINA SURGERY     uncertain procedure performed    FAMILY HISTORY: Family History  Problem Relation Age of Onset  . Heart disease Mother   . Heart disease Father   . Heart disease Sister   . Heart attack Sister   . Ulcerative colitis Brother   . Lung cancer Brother   . Thyroid cancer Sister   . Asthma Sister   . Diabetes Sister   . Asthma Sister   . Pancreatic cancer Sister   . Dementia Sister   . Asthma Brother   . Heart disease Brother   . Asthma Brother   . Lung cancer Brother   . Asthma Brother   . Lung cancer Brother   .  Lung cancer Brother   . Rheum arthritis Brother     ADVANCED DIRECTIVES (Y/N):  N  HEALTH MAINTENANCE: Social History   Tobacco Use  . Smoking status: Never Smoker  . Smokeless tobacco: Never Used  Substance Use Topics  . Alcohol use: No  . Drug use: No     Colonoscopy:  PAP:  Bone density:  Lipid panel:  No Known Allergies  Current Outpatient Medications  Medication Sig Dispense Refill  . acetaminophen (TYLENOL) 500 MG tablet Take 500 mg by  mouth every 6 (six) hours as needed.    . cyclobenzaprine (FLEXERIL) 10 MG tablet Take 1 tablet (10 mg total) by mouth 3 (three) times daily as needed for muscle spasms. 30 tablet 1  . esomeprazole (NEXIUM) 40 MG capsule Take 40 mg by mouth daily before breakfast.     . lidocaine-prilocaine (EMLA) cream APPLY A SMALL AMOUNT TO PORT SITE AT LEAST 1 HOUR PRIOR TO IT BEING ACCESSED THEN COVER WITH PLASTIC WRAP 30 g 0  . montelukast (SINGULAIR) 10 MG tablet Take 10 mg by mouth at bedtime.    . ondansetron (ZOFRAN) 8 MG tablet Take 1 tablet (8 mg total) by mouth 2 (two) times daily as needed for nausea or vomiting. 30 tablet 2  . polyethylene glycol (MIRALAX / GLYCOLAX) packet Take by mouth.    . valsartan (DIOVAN) 160 MG tablet Start 1/2 tab a day.  Can increase to 1 tab after a few weeks if BP stays high     No current facility-administered medications for this visit.    Facility-Administered Medications Ordered in Other Visits  Medication Dose Route Frequency Provider Last Rate Last Dose  . heparin lock flush 100 unit/mL  500 Units Intravenous Once Lloyd Huger, MD      . sodium chloride flush (NS) 0.9 % injection 10 mL  10 mL Intravenous PRN Lloyd Huger, MD   10 mL at 03/10/18 1200  . sodium chloride flush (NS) 0.9 % injection 10 mL  10 mL Intravenous PRN Lloyd Huger, MD   10 mL at 05/11/19 [id number]    OBJECTIVE: There were no vitals filed for this visit.   There is no height or weight on file to calculate BMI.    ECOG FS:0 - Asymptomatic  General: Thin, no acute distress. Eyes: Pink conjunctiva, anicteric sclera. HEENT: Normocephalic, moist mucous membranes. Lungs: Clear to auscultation bilaterally. Heart: Regular rate and rhythm. No rubs, murmurs, or gallops. Abdomen: Soft, nontender, nondistended. No organomegaly noted, normoactive bowel sounds. Musculoskeletal: No edema, cyanosis, or clubbing. Neuro: Alert, answering all questions appropriately. Cranial nerves  grossly intact. Skin: No rashes or petechiae noted. Psych: Normal affect.  LAB RESULTS:  Lab Results  Component Value Date   NA 131 (L) 05/11/2019   K 4.3 05/11/2019   CL 99 05/11/2019   CO2 22 05/11/2019   GLUCOSE 132 (H) 05/11/2019   BUN 23 05/11/2019   CREATININE 0.86 05/11/2019   CALCIUM 8.8 (L) 05/11/2019   PROT 6.8 05/11/2019   ALBUMIN 4.0 05/11/2019   AST 27 05/11/2019   ALT 15 05/11/2019   ALKPHOS 81 05/11/2019   BILITOT 0.5 05/11/2019   GFRNONAA 60 (L) 05/11/2019   GFRAA >60 05/11/2019    Lab Results  Component Value Date   WBC 3.7 (L) 05/11/2019   NEUTROABS 2.1 05/11/2019   HGB 9.8 (L) 05/11/2019   HCT 30.6 (L) 05/11/2019   MCV 94.4 05/11/2019   PLT 223 05/11/2019  STUDIES: No results found.  ASSESSMENT: Stage IIIC ovarian cancer.  PLAN:    1. Stage IIIC ovarian cancer: CT scan results from March 16, 2019 reviewed independently with marked progression of omental and peritoneal metastasis.  Patient CA-125 continues to trend down and is now 86.3. Because her recurrence is less than 6 months, will switch treatments to dose reduced single agent gemcitabine.  Plan to give treatment on days 1, 8, and 15 with a 22 off.  Although this regimen may need to be adjusted for cytopenias.  Proceed with cycle 2, day 1 of treatment today.  Can consider adding Udenyca in the future if necessary.  Return to clinic in 1 week for further evaluation and consideration of cycle 2, day 8.   2. Lupus anticoagulant: Patient was noted to have an elevated PTT as well as increased bleeding during her surgery for rectal prolapse.   3. Anemia: Chronic and unchanged.  Patient's hemoglobin is 9.8 today.  Previously, iron stores are within normal limits.  Continue to monitor closely.   4.  Leukopenia: Improved.  Proceed with treatment as above.  Consider Udenyca in the future. 5.  Thrombocytopenia: Resolved. 6.  Dizziness: Patient does not complain of this today. 7.  Diarrhea: Chronic and  unchanged.  Patient expressed understanding and was in agreement with this plan. She also understands that She can call clinic at any time with any questions, concerns, or complaints.   Cancer Staging Malignant neoplasm of ovary [facility]) Staging form: Ovary, Fallopian Tube, and Primary Peritoneal Carcinoma, AJCC 8th Edition - Clinical stage from 05/29/2017: Stage IIIC (cT3c, cN1b, cM0) - Signed by Lloyd Huger, MD on 05/29/2017   Lloyd Huger, MD   05/11/2019 10:14 AM

## 2019-05-10 ENCOUNTER — Telehealth: Payer: Self-pay

## 2019-05-10 NOTE — Telephone Encounter (Signed)
Pre-visit assessment attempted prior to appointment with Dr. Grayland Ormond on 05/11/2019. No answer / no msg left.

## 2019-05-11 ENCOUNTER — Encounter: Payer: Self-pay | Admitting: Oncology

## 2019-05-11 ENCOUNTER — Other Ambulatory Visit: Payer: Self-pay

## 2019-05-11 ENCOUNTER — Inpatient Hospital Stay (HOSPITAL_BASED_OUTPATIENT_CLINIC_OR_DEPARTMENT_OTHER): Payer: Medicare Other | Admitting: Oncology

## 2019-05-11 ENCOUNTER — Inpatient Hospital Stay: Payer: Medicare Other

## 2019-05-11 VITALS — BP 135/88 | HR 98 | Temp 97.8°F | Wt 103.0 lb

## 2019-05-11 DIAGNOSIS — C569 Malignant neoplasm of unspecified ovary: Secondary | ICD-10-CM

## 2019-05-11 LAB — CBC WITH DIFFERENTIAL/PLATELET
Abs Immature Granulocytes: 0.01 10*3/uL (ref 0.00–0.07)
Basophils Absolute: 0 10*3/uL (ref 0.0–0.1)
Basophils Relative: 1 %
Eosinophils Absolute: 0.1 10*3/uL (ref 0.0–0.5)
Eosinophils Relative: 2 %
HCT: 30.6 % — ABNORMAL LOW (ref 36.0–46.0)
Hemoglobin: 9.8 g/dL — ABNORMAL LOW (ref 12.0–15.0)
Immature Granulocytes: 0 %
Lymphocytes Relative: 25 %
Lymphs Abs: 0.9 10*3/uL (ref 0.7–4.0)
MCH: 30.2 pg (ref 26.0–34.0)
MCHC: 32 g/dL (ref 30.0–36.0)
MCV: 94.4 fL (ref 80.0–100.0)
Monocytes Absolute: 0.5 10*3/uL (ref 0.1–1.0)
Monocytes Relative: 14 %
Neutro Abs: 2.1 10*3/uL (ref 1.7–7.7)
Neutrophils Relative %: 58 %
Platelets: 223 10*3/uL (ref 150–400)
RBC: 3.24 MIL/uL — ABNORMAL LOW (ref 3.87–5.11)
RDW: 16.9 % — ABNORMAL HIGH (ref 11.5–15.5)
WBC: 3.7 10*3/uL — ABNORMAL LOW (ref 4.0–10.5)
nRBC: 0 % (ref 0.0–0.2)

## 2019-05-11 LAB — COMPREHENSIVE METABOLIC PANEL
ALT: 15 U/L (ref 0–44)
AST: 27 U/L (ref 15–41)
Albumin: 4 g/dL (ref 3.5–5.0)
Alkaline Phosphatase: 81 U/L (ref 38–126)
Anion gap: 10 (ref 5–15)
BUN: 23 mg/dL (ref 8–23)
CO2: 22 mmol/L (ref 22–32)
Calcium: 8.8 mg/dL — ABNORMAL LOW (ref 8.9–10.3)
Chloride: 99 mmol/L (ref 98–111)
Creatinine, Ser: 0.86 mg/dL (ref 0.44–1.00)
GFR calc Af Amer: >60 mL/min (ref >60)
GFR calc non Af Amer: 60 mL/min — ABNORMAL LOW (ref >60)
Glucose, Bld: 132 mg/dL — ABNORMAL HIGH (ref 70–99)
Potassium: 4.3 mmol/L (ref 3.5–5.1)
Sodium: 131 mmol/L — ABNORMAL LOW (ref 135–145)
Total Bilirubin: 0.5 mg/dL (ref 0.3–1.2)
Total Protein: 6.8 g/dL (ref 6.5–8.1)

## 2019-05-11 MED ORDER — SODIUM CHLORIDE 0.9 % IV SOLN: 800.0000 mg/m2 | Freq: Once | INTRAVENOUS | Status: DC

## 2019-05-11 MED ORDER — PROCHLORPERAZINE MALEATE 10 MG PO TABS
10.0000 mg | ORAL_TABLET | Freq: Once | ORAL | Status: AC
  Administered 2019-05-11: 10 mg via ORAL
  Filled 2019-05-11: qty 1

## 2019-05-11 MED ORDER — HEPARIN SOD (PORK) LOCK FLUSH 100 UNIT/ML IV SOLN
500.0000 [IU] | Freq: Once | INTRAVENOUS | Status: AC
  Administered 2019-05-11: 500 [IU] via INTRAVENOUS
  Filled 2019-05-11: qty 5

## 2019-05-11 MED ORDER — SODIUM CHLORIDE 0.9 % IV SOLN
Freq: Once | INTRAVENOUS | Status: AC
  Administered 2019-05-11: 10:00:00 via INTRAVENOUS
  Filled 2019-05-11: qty 250

## 2019-05-11 MED ORDER — SODIUM CHLORIDE 0.9% FLUSH
10.0000 mL | INTRAVENOUS | Status: DC | PRN
  Administered 2019-05-11: 10 mL via INTRAVENOUS
  Filled 2019-05-11: qty 10

## 2019-05-11 MED ORDER — SODIUM CHLORIDE 0.9 % IV SOLN
800.0000 mg/m2 | Freq: Once | INTRAVENOUS | Status: AC
  Administered 2019-05-11: 1064 mg via INTRAVENOUS
  Filled 2019-05-11: qty 26.3

## 2019-05-11 NOTE — Progress Notes (Signed)
Patient here today for follow up.  Patient denies any nausea, vomiting, diarrhea, constipation or pain.  Patient c/o urinary and bowel incontinence.

## 2019-05-12 LAB — CA 125: Cancer Antigen (CA) 125: 94.5 U/mL — ABNORMAL HIGH (ref 0.0–38.1)

## 2019-05-17 NOTE — Progress Notes (Signed)
Called patient for tomorrows follow up visit.  Patient denies any vomiting, diarrhea, constipation or pain.   Patient c/o some nausea which is controlled with zofran.

## 2019-05-18 ENCOUNTER — Inpatient Hospital Stay: Payer: Medicare Other | Attending: Oncology | Admitting: *Deleted

## 2019-05-18 ENCOUNTER — Inpatient Hospital Stay: Payer: Medicare Other

## 2019-05-18 ENCOUNTER — Other Ambulatory Visit: Payer: Self-pay

## 2019-05-18 ENCOUNTER — Inpatient Hospital Stay (HOSPITAL_BASED_OUTPATIENT_CLINIC_OR_DEPARTMENT_OTHER): Payer: Medicare Other | Admitting: Oncology

## 2019-05-18 VITALS — BP 134/67 | HR 96 | Temp 98.9°F | Wt 103.0 lb

## 2019-05-18 DIAGNOSIS — R5381 Other malaise: Secondary | ICD-10-CM | POA: Diagnosis not present

## 2019-05-18 DIAGNOSIS — C569 Malignant neoplasm of unspecified ovary: Secondary | ICD-10-CM | POA: Insufficient documentation

## 2019-05-18 DIAGNOSIS — Z800 Family history of malignant neoplasm of digestive organs: Secondary | ICD-10-CM | POA: Diagnosis not present

## 2019-05-18 DIAGNOSIS — C786 Secondary malignant neoplasm of retroperitoneum and peritoneum: Secondary | ICD-10-CM | POA: Diagnosis not present

## 2019-05-18 DIAGNOSIS — R5383 Other fatigue: Secondary | ICD-10-CM | POA: Diagnosis not present

## 2019-05-18 DIAGNOSIS — Z5111 Encounter for antineoplastic chemotherapy: Secondary | ICD-10-CM | POA: Insufficient documentation

## 2019-05-18 DIAGNOSIS — D649 Anemia, unspecified: Secondary | ICD-10-CM | POA: Insufficient documentation

## 2019-05-18 DIAGNOSIS — I10 Essential (primary) hypertension: Secondary | ICD-10-CM | POA: Insufficient documentation

## 2019-05-18 DIAGNOSIS — R197 Diarrhea, unspecified: Secondary | ICD-10-CM | POA: Diagnosis not present

## 2019-05-18 DIAGNOSIS — Z95828 Presence of other vascular implants and grafts: Secondary | ICD-10-CM

## 2019-05-18 DIAGNOSIS — Z801 Family history of malignant neoplasm of trachea, bronchus and lung: Secondary | ICD-10-CM | POA: Insufficient documentation

## 2019-05-18 DIAGNOSIS — D6862 Lupus anticoagulant syndrome: Secondary | ICD-10-CM | POA: Insufficient documentation

## 2019-05-18 DIAGNOSIS — Z23 Encounter for immunization: Secondary | ICD-10-CM | POA: Diagnosis not present

## 2019-05-18 DIAGNOSIS — D72819 Decreased white blood cell count, unspecified: Secondary | ICD-10-CM | POA: Diagnosis not present

## 2019-05-18 DIAGNOSIS — J45909 Unspecified asthma, uncomplicated: Secondary | ICD-10-CM | POA: Diagnosis not present

## 2019-05-18 DIAGNOSIS — Z79899 Other long term (current) drug therapy: Secondary | ICD-10-CM | POA: Insufficient documentation

## 2019-05-18 DIAGNOSIS — R413 Other amnesia: Secondary | ICD-10-CM | POA: Insufficient documentation

## 2019-05-18 DIAGNOSIS — K219 Gastro-esophageal reflux disease without esophagitis: Secondary | ICD-10-CM | POA: Insufficient documentation

## 2019-05-18 DIAGNOSIS — R140 Abdominal distension (gaseous): Secondary | ICD-10-CM | POA: Diagnosis not present

## 2019-05-18 DIAGNOSIS — M1990 Unspecified osteoarthritis, unspecified site: Secondary | ICD-10-CM | POA: Diagnosis not present

## 2019-05-18 DIAGNOSIS — R531 Weakness: Secondary | ICD-10-CM | POA: Insufficient documentation

## 2019-05-18 DIAGNOSIS — E871 Hypo-osmolality and hyponatremia: Secondary | ICD-10-CM | POA: Insufficient documentation

## 2019-05-18 LAB — CBC WITH DIFFERENTIAL/PLATELET
Abs Immature Granulocytes: 0.01 10*3/uL (ref 0.00–0.07)
Basophils Absolute: 0 10*3/uL (ref 0.0–0.1)
Basophils Relative: 1 %
Eosinophils Absolute: 0 10*3/uL (ref 0.0–0.5)
Eosinophils Relative: 1 %
HCT: 27.8 % — ABNORMAL LOW (ref 36.0–46.0)
Hemoglobin: 9.2 g/dL — ABNORMAL LOW (ref 12.0–15.0)
Immature Granulocytes: 0 %
Lymphocytes Relative: 28 %
Lymphs Abs: 0.8 10*3/uL (ref 0.7–4.0)
MCH: 30.9 pg (ref 26.0–34.0)
MCHC: 33.1 g/dL (ref 30.0–36.0)
MCV: 93.3 fL (ref 80.0–100.0)
Monocytes Absolute: 0.1 10*3/uL (ref 0.1–1.0)
Monocytes Relative: 5 %
Neutro Abs: 1.8 10*3/uL (ref 1.7–7.7)
Neutrophils Relative %: 65 %
Platelets: 187 10*3/uL (ref 150–400)
RBC: 2.98 MIL/uL — ABNORMAL LOW (ref 3.87–5.11)
RDW: 16 % — ABNORMAL HIGH (ref 11.5–15.5)
WBC: 2.8 10*3/uL — ABNORMAL LOW (ref 4.0–10.5)
nRBC: 0 % (ref 0.0–0.2)

## 2019-05-18 LAB — COMPREHENSIVE METABOLIC PANEL
ALT: 13 U/L (ref 0–44)
AST: 22 U/L (ref 15–41)
Albumin: 3.8 g/dL (ref 3.5–5.0)
Alkaline Phosphatase: 75 U/L (ref 38–126)
Anion gap: 7 (ref 5–15)
BUN: 23 mg/dL (ref 8–23)
CO2: 24 mmol/L (ref 22–32)
Calcium: 8.7 mg/dL — ABNORMAL LOW (ref 8.9–10.3)
Chloride: 98 mmol/L (ref 98–111)
Creatinine, Ser: 0.84 mg/dL (ref 0.44–1.00)
GFR calc Af Amer: >60 mL/min (ref >60)
GFR calc non Af Amer: >60 mL/min (ref >60)
Glucose, Bld: 150 mg/dL — ABNORMAL HIGH (ref 70–99)
Potassium: 4.4 mmol/L (ref 3.5–5.1)
Sodium: 129 mmol/L — ABNORMAL LOW (ref 135–145)
Total Bilirubin: 0.5 mg/dL (ref 0.3–1.2)
Total Protein: 6.5 g/dL (ref 6.5–8.1)

## 2019-05-18 MED ORDER — SODIUM CHLORIDE 0.9 % IV SOLN
Freq: Once | INTRAVENOUS | Status: AC
  Administered 2019-05-18: 11:00:00 via INTRAVENOUS
  Filled 2019-05-18: qty 250

## 2019-05-18 MED ORDER — SODIUM CHLORIDE 0.9% FLUSH
10.0000 mL | Freq: Once | INTRAVENOUS | Status: AC
  Administered 2019-05-18: 10 mL via INTRAVENOUS
  Filled 2019-05-18: qty 10

## 2019-05-18 MED ORDER — HEPARIN SOD (PORK) LOCK FLUSH 100 UNIT/ML IV SOLN
500.0000 [IU] | Freq: Once | INTRAVENOUS | Status: AC | PRN
  Administered 2019-05-18: 500 [IU]
  Filled 2019-05-18: qty 5

## 2019-05-18 MED ORDER — SODIUM CHLORIDE 0.9 % IV SOLN
800.0000 mg/m2 | Freq: Once | INTRAVENOUS | Status: AC
  Administered 2019-05-18: 1064 mg via INTRAVENOUS
  Filled 2019-05-18: qty 26.3

## 2019-05-18 MED ORDER — PROCHLORPERAZINE MALEATE 10 MG PO TABS
10.0000 mg | ORAL_TABLET | Freq: Once | ORAL | Status: AC
  Administered 2019-05-18: 10 mg via ORAL
  Filled 2019-05-18: qty 1

## 2019-05-18 NOTE — Progress Notes (Signed)
[place]  Telephone:(336) [phone] Fax:(336) [phone]  ID: Tamara Mckinney OB: 28-Jun-1928  MR#: [id number]  [id number]  Patient Care Team: [name], MD as PCP - General (Family Medicine) [name], RN as Registered Nurse Herbert Pun, MD as Consulting Physician (General Surgery)  CHIEF COMPLAINT: Stage IIIC ovarian cancer.  INTERVAL HISTORY: Patient presents to clinic today for further evaluation and consideration of cycle 2-day 8 of single agent gemcitabine.  Patient states she is doing well.  She has mild fatigue and weakness but otherwise can do all of her ADLs on her own.  Her appetite is great.  She complains of mild bloating but otherwise feels well.  She denies any neurological complaints.  She denies any chest pain, shortness of breath, cough, hemoptysis, nausea, vomiting or constipation.  Has occasional diarrhea.  She denies any urinary concerns.  REVIEW OF SYSTEMS:   Review of Systems  Constitutional: Positive for malaise/fatigue. Negative for fever and weight loss.  Respiratory: Negative.  Negative for cough and shortness of breath.   Cardiovascular: Negative.  Negative for chest pain and leg swelling.  Gastrointestinal: Negative.  Negative for abdominal pain, blood in stool, constipation, diarrhea, heartburn, melena, nausea and vomiting.       Bloating  Genitourinary: Negative.  Negative for dysuria.  Musculoskeletal: Negative.  Negative for back pain and neck pain.  Skin: Negative.  Negative for itching and rash.  Neurological: Negative.  Negative for dizziness, focal weakness and weakness.  Endo/Heme/Allergies: Does not bruise/bleed easily.  Psychiatric/Behavioral: Positive for memory loss. The patient is not nervous/anxious.     As per HPI. Otherwise, a complete review of systems is negative.  PAST MEDICAL HISTORY: Past Medical History:  Diagnosis Date  . Anemia   . Arthritis   . Asthma   . Cancer ([place])   .  Dyspnea   . GERD (gastroesophageal reflux disease)   . Hypertension     PAST SURGICAL HISTORY: Past Surgical History:  Procedure Laterality Date  . ABDOMINAL HYSTERECTOMY    . BREAST BIOPSY     x6  . BUNIONECTOMY Bilateral   . CATARACT EXTRACTION, BILATERAL    . FLEXIBLE SIGMOIDOSCOPY N/A 05/21/2017   Procedure: FLEXIBLE SIGMOIDOSCOPY;  Surgeon: Lin Landsman, MD;  Location: [facility] ENDOSCOPY;  Service: Gastroenterology;  Laterality: N/A;  . INCONTINENCE SURGERY    . PORTA CATH INSERTION N/A 06/16/2017   Procedure: PORTA CATH INSERTION;  Surgeon: Algernon Huxley, MD;  Location: [place] CV LAB;  Service: Cardiovascular;  Laterality: N/A;  . RECTAL SURGERY  04/2018  . REPAIR OF RECTAL PROLAPSE N/A 05/11/2017   Procedure: REPAIR OF RECTAL PROLAPSE;  Surgeon: Leonie Green, MD;  Location: [facility] ORS;  Service: General;  Laterality: N/A;  . TONSILLECTOMY    . VAGINA SURGERY     uncertain procedure performed    FAMILY HISTORY: Family History  Problem Relation Age of Onset  . Heart disease Mother   . Heart disease Father   . Heart disease Sister   . Heart attack Sister   . Ulcerative colitis Brother   . Lung cancer Brother   . Thyroid cancer Sister   . Asthma Sister   . Diabetes Sister   . Asthma Sister   . Pancreatic cancer Sister   . Dementia Sister   . Asthma Brother   . Heart disease Brother   . Asthma Brother   . Lung cancer Brother   . Asthma Brother   . Lung cancer Brother   .  Lung cancer Brother   . Rheum arthritis Brother     ADVANCED DIRECTIVES (Y/N):  N  HEALTH MAINTENANCE: Social History   Tobacco Use  . Smoking status: Never Smoker  . Smokeless tobacco: Never Used  Substance Use Topics  . Alcohol use: No  . Drug use: No     Colonoscopy:  PAP:  Bone density:  Lipid panel:  No Known Allergies  Current Outpatient Medications  Medication Sig Dispense Refill  . acetaminophen (TYLENOL) 500 MG tablet Take 500 mg by mouth every 6 (six)  hours as needed.    . cyclobenzaprine (FLEXERIL) 10 MG tablet Take 1 tablet (10 mg total) by mouth 3 (three) times daily as needed for muscle spasms. 30 tablet 1  . esomeprazole (NEXIUM) 40 MG capsule Take 40 mg by mouth daily before breakfast.     . lidocaine-prilocaine (EMLA) cream APPLY A SMALL AMOUNT TO PORT SITE AT LEAST 1 HOUR PRIOR TO IT BEING ACCESSED THEN COVER WITH PLASTIC WRAP 30 g 0  . montelukast (SINGULAIR) 10 MG tablet Take 10 mg by mouth at bedtime.    . ondansetron (ZOFRAN) 8 MG tablet Take 1 tablet (8 mg total) by mouth 2 (two) times daily as needed for nausea or vomiting. 30 tablet 2  . polyethylene glycol (MIRALAX / GLYCOLAX) packet Take by mouth.    . valsartan (DIOVAN) 160 MG tablet Start 1/2 tab a day.  Can increase to 1 tab after a few weeks if BP stays high     No current facility-administered medications for this visit.    Facility-Administered Medications Ordered in Other Visits  Medication Dose Route Frequency Provider Last Rate Last Dose  . sodium chloride flush (NS) 0.9 % injection 10 mL  10 mL Intravenous PRN Lloyd Huger, MD   10 mL at 03/10/18 1200    OBJECTIVE: There were no vitals filed for this visit.   There is no height or weight on file to calculate BMI.    ECOG FS:0 - Asymptomatic  Physical Exam Constitutional:      Appearance: Normal appearance.  HENT:     Head: Normocephalic and atraumatic.  Eyes:     Pupils: Pupils are equal, round, and reactive to light.  Neck:     Musculoskeletal: Normal range of motion.  Cardiovascular:     Rate and Rhythm: Normal rate and regular rhythm.     Heart sounds: Normal heart sounds. No murmur.  Pulmonary:     Effort: Pulmonary effort is normal.     Breath sounds: Normal breath sounds. No wheezing.  Abdominal:     General: Bowel sounds are normal. There is no distension.     Palpations: Abdomen is soft.     Tenderness: There is no abdominal tenderness.  Musculoskeletal: Normal range of motion.   Skin:    General: Skin is warm and dry.     Findings: No rash.  Neurological:     Mental Status: She is alert and oriented to person, place, and time.  Psychiatric:        Judgment: Judgment normal.     LAB RESULTS:  Lab Results  Component Value Date   NA 131 (L) 05/11/2019   K 4.3 05/11/2019   CL 99 05/11/2019   CO2 22 05/11/2019   GLUCOSE 132 (H) 05/11/2019   BUN 23 05/11/2019   CREATININE 0.86 05/11/2019   CALCIUM 8.8 (L) 05/11/2019   PROT 6.8 05/11/2019   ALBUMIN 4.0 05/11/2019   AST 27 05/11/2019  ALT 15 05/11/2019   ALKPHOS 81 05/11/2019   BILITOT 0.5 05/11/2019   GFRNONAA 60 (L) 05/11/2019   GFRAA >60 05/11/2019    Lab Results  Component Value Date   WBC 2.8 (L) 05/18/2019   NEUTROABS 1.8 05/18/2019   HGB 9.2 (L) 05/18/2019   HCT 27.8 (L) 05/18/2019   MCV 93.3 05/18/2019   PLT 187 05/18/2019     STUDIES: No results found.  ASSESSMENT: Stage IIIC ovarian cancer.  PLAN:    1. Stage IIIC ovarian cancer: CT scan results from March 16, 2019 reviewed independently with marked progression of omental and peritoneal metastasis.  Patient CA-125 continues to trend down and is now 86.3. Because her recurrence is less than 6 months, will switch treatments to dose reduced single agent gemcitabine.  Plan to give treatment on days 1, 8, and 15 with a 22 off.  Although this regimen may need to be adjusted for cytopenias.  Proceed with cycle 2, day 8 of treatment today.  Can consider adding Udenyca in the future if necessary.  Return to clinic in 1 week for further evaluation and consideration of cycle 2, day 15.   2. Lupus anticoagulant: Patient was noted to have an elevated PTT as well as increased bleeding during her surgery for rectal prolapse.   3. Anemia: Chronic and unchanged.  Patient's hemoglobin is 9.2 today.  Previously, iron stores are within normal limits.  Continue to monitor closely.   4.  Leukopenia: Improved. ANC 1.8.  Proceed with treatment as above.   Consider Udenyca in the future. 5.  Thrombocytopenia: Resolved. 6. Hyponatremia: NA 128 today. Push Fluids. She will receive 500 ml of Normal saline with treatment today.  7.  Diarrhea: Chronic and unchanged.  Patient expressed understanding and was in agreement with this plan. She also understands that She can call clinic at any time with any questions, concerns, or complaints.   Cancer Staging Malignant neoplasm of ovary [facility]) Staging form: Ovary, Fallopian Tube, and Primary Peritoneal Carcinoma, AJCC 8th Edition - Clinical stage from 05/29/2017: Stage IIIC (cT3c, cN1b, cM0) - Signed by Lloyd Huger, MD on 05/29/2017   Jacquelin Hawking, NP   05/18/2019 10:44 AM

## 2019-05-21 NOTE — Progress Notes (Signed)
Tamara Mckinney  [phone]) [phone] Fax:(336) [phone]  ID: Tamara Mckinney OB: 1927-12-14  MR#: [id number]  [id number]  Patient Care Team: [name], MD as PCP - General (Family Medicine) [name], RN as Registered Nurse Herbert Pun, MD as Consulting Physician (General Surgery)  CHIEF COMPLAINT: Stage IIIC ovarian cancer.  INTERVAL HISTORY: Patient returns to clinic today for further evaluation and consideration of cycle 2, day 15 of single agent gemcitabine.  She continues to complain of mild memory loss, but otherwise is tolerating her treatments well.  She has no neurologic complaints.  She has a good appetite and her weight has remained stable.  She denies any chest pain, shortness of breath, cough, or hemoptysis.  She denies any nausea, vomiting, constipation.  She continues to have occasional diarrhea residual from her surgery many years ago.  She has no urinary complaints.  Patient offers no further specific complaints today.  REVIEW OF SYSTEMS:   Review of Systems  Constitutional: Negative.  Negative for fever, malaise/fatigue and weight loss.  Respiratory: Negative.  Negative for cough and shortness of breath.   Cardiovascular: Negative.  Negative for chest pain and leg swelling.  Gastrointestinal: Positive for diarrhea. Negative for abdominal pain, blood in stool, constipation, heartburn, melena, nausea and vomiting.  Genitourinary: Negative.  Negative for dysuria.  Musculoskeletal: Negative.  Negative for back pain and neck pain.  Skin: Negative.  Negative for itching and rash.  Neurological: Negative.  Negative for dizziness, focal weakness and weakness.  Endo/Heme/Allergies: Does not bruise/bleed easily.  Psychiatric/Behavioral: Positive for memory loss. The patient is not nervous/anxious.     As per HPI. Otherwise, a complete review of systems is negative.  PAST MEDICAL HISTORY: Past Medical History:  Diagnosis Date  .  Anemia   . Arthritis   . Asthma   . Cancer ([place])   . Dyspnea   . GERD (gastroesophageal reflux disease)   . Hypertension     PAST SURGICAL HISTORY: Past Surgical History:  Procedure Laterality Date  . ABDOMINAL HYSTERECTOMY    . BREAST BIOPSY     x6  . BUNIONECTOMY Bilateral   . CATARACT EXTRACTION, BILATERAL    . FLEXIBLE SIGMOIDOSCOPY N/A 05/21/2017   Procedure: FLEXIBLE SIGMOIDOSCOPY;  Surgeon: Lin Landsman, MD;  Location: [facility] ENDOSCOPY;  Service: Gastroenterology;  Laterality: N/A;  . INCONTINENCE SURGERY    . PORTA CATH INSERTION N/A 06/16/2017   Procedure: PORTA CATH INSERTION;  Surgeon: Algernon Huxley, MD;  Location: [place] CV LAB;  Service: Cardiovascular;  Laterality: N/A;  . RECTAL SURGERY  04/2018  . REPAIR OF RECTAL PROLAPSE N/A 05/11/2017   Procedure: REPAIR OF RECTAL PROLAPSE;  Surgeon: Leonie Green, MD;  Location: [facility] ORS;  Service: General;  Laterality: N/A;  . TONSILLECTOMY    . VAGINA SURGERY     uncertain procedure performed    FAMILY HISTORY: Family History  Problem Relation Age of Onset  . Heart disease Mother   . Heart disease Father   . Heart disease Sister   . Heart attack Sister   . Ulcerative colitis Brother   . Lung cancer Brother   . Thyroid cancer Sister   . Asthma Sister   . Diabetes Sister   . Asthma Sister   . Pancreatic cancer Sister   . Dementia Sister   . Asthma Brother   . Heart disease Brother   . Asthma Brother   . Lung cancer Brother   . Asthma Brother   .  Lung cancer Brother   . Lung cancer Brother   . Rheum arthritis Brother     ADVANCED DIRECTIVES (Y/N):  N  HEALTH MAINTENANCE: Social History   Tobacco Use  . Smoking status: Never Smoker  . Smokeless tobacco: Never Used  Substance Use Topics  . Alcohol use: No  . Drug use: No     Colonoscopy:  PAP:  Bone density:  Lipid panel:  No Known Allergies  Current Outpatient Medications  Medication Sig Dispense Refill  . acetaminophen  (TYLENOL) 500 MG tablet Take 500 mg by mouth every 6 (six) hours as needed.    . cyclobenzaprine (FLEXERIL) 10 MG tablet Take 1 tablet (10 mg total) by mouth 3 (three) times daily as needed for muscle spasms. 30 tablet 1  . esomeprazole (NEXIUM) 40 MG capsule Take 40 mg by mouth daily before breakfast.     . lidocaine-prilocaine (EMLA) cream APPLY A SMALL AMOUNT TO PORT SITE AT LEAST 1 HOUR PRIOR TO IT BEING ACCESSED THEN COVER WITH PLASTIC WRAP 30 g 0  . montelukast (SINGULAIR) 10 MG tablet Take 10 mg by mouth at bedtime.    . ondansetron (ZOFRAN) 8 MG tablet Take 1 tablet (8 mg total) by mouth 2 (two) times daily as needed for nausea or vomiting. 30 tablet 2  . polyethylene glycol (MIRALAX / GLYCOLAX) packet Take by mouth.    . valsartan (DIOVAN) 160 MG tablet Start 1/2 tab a day.  Can increase to 1 tab after a few weeks if BP stays high     No current facility-administered medications for this visit.    Facility-Administered Medications Ordered in Other Visits  Medication Dose Route Frequency Provider Last Rate Last Dose  . sodium chloride flush (NS) 0.9 % injection 10 mL  10 mL Intravenous PRN Lloyd Huger, MD   10 mL at 03/10/18 1200    OBJECTIVE: Vitals:   05/25/19 0928  BP: 125/68  Pulse: 98  Temp: 98.9 F (37.2 C)     Body mass index is 21.9 kg/m.    ECOG FS:0 - Asymptomatic  General: Thin, no acute distress. Eyes: Pink conjunctiva, anicteric sclera. HEENT: Normocephalic, moist mucous membranes. Lungs: Clear to auscultation bilaterally. Heart: Regular rate and rhythm. No rubs, murmurs, or gallops. Abdomen: Soft, nontender, nondistended. No organomegaly noted, normoactive bowel sounds. Musculoskeletal: No edema, cyanosis, or clubbing. Neuro: Alert, answering all questions appropriately. Cranial nerves grossly intact. Skin: No rashes or petechiae noted. Psych: Normal affect.  LAB RESULTS:  Lab Results  Component Value Date   NA 132 (L) 05/25/2019   K 4.3  05/25/2019   CL 101 05/25/2019   CO2 24 05/25/2019   GLUCOSE 119 (H) 05/25/2019   BUN 31 (H) 05/25/2019   CREATININE 0.90 05/25/2019   CALCIUM 8.9 05/25/2019   PROT 6.9 05/25/2019   ALBUMIN 4.0 05/25/2019   AST 23 05/25/2019   ALT 14 05/25/2019   ALKPHOS 78 05/25/2019   BILITOT 0.6 05/25/2019   GFRNONAA 57 (L) 05/25/2019   GFRAA >60 05/25/2019    Lab Results  Component Value Date   WBC 2.9 (L) 05/25/2019   NEUTROABS 1.9 05/25/2019   HGB 8.9 (L) 05/25/2019   HCT 26.7 (L) 05/25/2019   MCV 93.0 05/25/2019   PLT 124 (L) 05/25/2019     STUDIES: No results found.  ASSESSMENT: Stage IIIC ovarian cancer.  PLAN:    1. Stage IIIC ovarian cancer: CT scan results from March 16, 2019 reviewed independently with marked progression of omental  and peritoneal metastasis.  Patient CA-125 continues to trend down and is now 63.2.  Patient is now considered platinum refractory and is receiving single agent gemcitabine. Plan to give treatment on days 1, 8, and 15 with a 22 off.  Although this regimen may need to be adjusted for cytopenias.  Proceed with cycle 2, day 15 of treatment today. Can consider adding Udenyca in the future if necessary.  Return to clinic in 2 weeks for further evaluation and consideration of cycle 3, day 1. 2. Lupus anticoagulant: Patient was noted to have an elevated PTT as well as increased bleeding during her surgery for rectal prolapse.   3. Anemia: Chronic and unchanged.  Patient's hemoglobin is 8.9 today.  Previously, iron stores are within normal limits.  Continue to monitor closely.   4.  Leukopenia: Chronic and unchanged.  Proceed with treatment as above.  Consider Udenyca in the future. 5.  Thrombocytopenia: Mild, monitor. 6.  Dizziness: Patient does not complain of this today. 7.  Diarrhea: Chronic and unchanged.  Patient expressed understanding and was in agreement with this plan. She also understands that She can call clinic at any time with any questions,  concerns, or complaints.   Cancer Staging Malignant neoplasm of ovary [facility]) Staging form: Ovary, Fallopian Tube, and Primary Peritoneal Carcinoma, AJCC 8th Edition - Clinical stage from 05/29/2017: Stage IIIC (cT3c, cN1b, cM0) - Signed by Lloyd Huger, MD on 05/29/2017   Lloyd Huger, MD   05/27/2019 12:27 PM

## 2019-05-24 ENCOUNTER — Other Ambulatory Visit: Payer: Self-pay

## 2019-05-24 ENCOUNTER — Encounter: Payer: Self-pay | Admitting: Oncology

## 2019-05-24 NOTE — Progress Notes (Signed)
Patient pre screened for office appointment, no questions or concerns today. 

## 2019-05-25 ENCOUNTER — Other Ambulatory Visit: Payer: Self-pay

## 2019-05-25 ENCOUNTER — Inpatient Hospital Stay: Payer: Medicare Other | Admitting: *Deleted

## 2019-05-25 ENCOUNTER — Inpatient Hospital Stay: Payer: Medicare Other

## 2019-05-25 ENCOUNTER — Inpatient Hospital Stay (HOSPITAL_BASED_OUTPATIENT_CLINIC_OR_DEPARTMENT_OTHER): Payer: Medicare Other | Admitting: Oncology

## 2019-05-25 VITALS — BP 125/68 | HR 98 | Temp 98.9°F | Wt 103.0 lb

## 2019-05-25 VITALS — Resp 18

## 2019-05-25 DIAGNOSIS — C569 Malignant neoplasm of unspecified ovary: Secondary | ICD-10-CM

## 2019-05-25 DIAGNOSIS — Z95828 Presence of other vascular implants and grafts: Secondary | ICD-10-CM

## 2019-05-25 LAB — COMPREHENSIVE METABOLIC PANEL
ALT: 14 U/L (ref 0–44)
AST: 23 U/L (ref 15–41)
Albumin: 4 g/dL (ref 3.5–5.0)
Alkaline Phosphatase: 78 U/L (ref 38–126)
Anion gap: 7 (ref 5–15)
BUN: 31 mg/dL — ABNORMAL HIGH (ref 8–23)
CO2: 24 mmol/L (ref 22–32)
Calcium: 8.9 mg/dL (ref 8.9–10.3)
Chloride: 101 mmol/L (ref 98–111)
Creatinine, Ser: 0.9 mg/dL (ref 0.44–1.00)
GFR calc Af Amer: >60 mL/min (ref >60)
GFR calc non Af Amer: 57 mL/min — ABNORMAL LOW (ref >60)
Glucose, Bld: 119 mg/dL — ABNORMAL HIGH (ref 70–99)
Potassium: 4.3 mmol/L (ref 3.5–5.1)
Sodium: 132 mmol/L — ABNORMAL LOW (ref 135–145)
Total Bilirubin: 0.6 mg/dL (ref 0.3–1.2)
Total Protein: 6.9 g/dL (ref 6.5–8.1)

## 2019-05-25 LAB — CBC WITH DIFFERENTIAL/PLATELET
Abs Immature Granulocytes: 0.01 10*3/uL (ref 0.00–0.07)
Basophils Absolute: 0 10*3/uL (ref 0.0–0.1)
Basophils Relative: 1 %
Eosinophils Absolute: 0 10*3/uL (ref 0.0–0.5)
Eosinophils Relative: 1 %
HCT: 26.7 % — ABNORMAL LOW (ref 36.0–46.0)
Hemoglobin: 8.9 g/dL — ABNORMAL LOW (ref 12.0–15.0)
Immature Granulocytes: 0 %
Lymphocytes Relative: 29 %
Lymphs Abs: 0.9 10*3/uL (ref 0.7–4.0)
MCH: 31 pg (ref 26.0–34.0)
MCHC: 33.3 g/dL (ref 30.0–36.0)
MCV: 93 fL (ref 80.0–100.0)
Monocytes Absolute: 0.1 10*3/uL (ref 0.1–1.0)
Monocytes Relative: 5 %
Neutro Abs: 1.9 10*3/uL (ref 1.7–7.7)
Neutrophils Relative %: 64 %
Platelets: 124 10*3/uL — ABNORMAL LOW (ref 150–400)
RBC: 2.87 MIL/uL — ABNORMAL LOW (ref 3.87–5.11)
RDW: 16.2 % — ABNORMAL HIGH (ref 11.5–15.5)
WBC: 2.9 10*3/uL — ABNORMAL LOW (ref 4.0–10.5)
nRBC: 0 % (ref 0.0–0.2)

## 2019-05-25 MED ORDER — SODIUM CHLORIDE 0.9 % IV SOLN
988.0000 mg | Freq: Once | INTRAVENOUS | Status: AC
  Administered 2019-05-25: 988 mg via INTRAVENOUS
  Filled 2019-05-25: qty 25.98

## 2019-05-25 MED ORDER — SODIUM CHLORIDE 0.9 % IV SOLN
Freq: Once | INTRAVENOUS | Status: AC
  Administered 2019-05-25: 10:00:00 via INTRAVENOUS
  Filled 2019-05-25: qty 250

## 2019-05-25 MED ORDER — HEPARIN SOD (PORK) LOCK FLUSH 100 UNIT/ML IV SOLN
500.0000 [IU] | Freq: Once | INTRAVENOUS | Status: AC | PRN
  Administered 2019-05-25: 500 [IU]
  Filled 2019-05-25: qty 5

## 2019-05-25 MED ORDER — SODIUM CHLORIDE 0.9 % IV SOLN
800.0000 mg/m2 | Freq: Once | INTRAVENOUS | Status: DC
  Filled 2019-05-25: qty 28

## 2019-05-25 MED ORDER — SODIUM CHLORIDE 0.9% FLUSH
10.0000 mL | Freq: Once | INTRAVENOUS | Status: AC
  Administered 2019-05-25: 10 mL via INTRAVENOUS
  Filled 2019-05-25: qty 10

## 2019-05-25 MED ORDER — PROCHLORPERAZINE MALEATE 10 MG PO TABS
10.0000 mg | ORAL_TABLET | Freq: Once | ORAL | Status: AC
  Administered 2019-05-25: 10 mg via ORAL
  Filled 2019-05-25: qty 1

## 2019-05-25 MED ORDER — SODIUM CHLORIDE 0.9 % IV SOLN: 1000.0000 mg | Freq: Once | INTRAVENOUS | Status: DC

## 2019-05-26 LAB — CA 125: Cancer Antigen (CA) 125: 63.2 U/mL — ABNORMAL HIGH (ref 0.0–38.1)

## 2019-05-30 ENCOUNTER — Telehealth: Payer: Self-pay | Admitting: *Deleted

## 2019-05-30 NOTE — Telephone Encounter (Signed)
Son called requesting that patient be given her flu shot if appropriate. Her next appointment is on the 22 nd of this month. Please add to her visit if in agreement

## 2019-05-30 NOTE — Telephone Encounter (Signed)
That's fine

## 2019-06-03 NOTE — Progress Notes (Signed)
[place]  Telephone:(336) [phone] Fax:(336) [phone]  ID: Tamara Mckinney OB: June 12, 1928  MR#: [id number]  [id number]  Patient Care Team: [name], MD as PCP - General (Family Medicine) [name], RN as Registered Nurse Herbert Pun, MD as Consulting Physician (General Surgery)  CHIEF COMPLAINT: Stage IIIC ovarian cancer.  INTERVAL HISTORY: Patient returns to clinic today for further evaluation and consideration of cycle 3, day 1 of single agent gemcitabine.  She is tolerating her treatments well without significant side effects.  She continues to complain of mild memory loss. She has no neurologic complaints.  She has a good appetite and her weight has remained stable.  She denies any chest pain, shortness of breath, cough, or hemoptysis.  She denies any further abdominal bloating.  She denies any nausea, vomiting, constipation.  She continues to have occasional diarrhea residual from her surgery many years ago.  She has no urinary complaints.  Patient offers no specific complaints today.  REVIEW OF SYSTEMS:   Review of Systems  Constitutional: Negative.  Negative for fever, malaise/fatigue and weight loss.  Respiratory: Negative.  Negative for cough and shortness of breath.   Cardiovascular: Negative.  Negative for chest pain and leg swelling.  Gastrointestinal: Positive for diarrhea. Negative for abdominal pain, blood in stool, constipation, heartburn, melena, nausea and vomiting.  Genitourinary: Negative.  Negative for dysuria.  Musculoskeletal: Negative.  Negative for back pain and neck pain.  Skin: Negative.  Negative for itching and rash.  Neurological: Negative.  Negative for dizziness, focal weakness and weakness.  Endo/Heme/Allergies: Does not bruise/bleed easily.  Psychiatric/Behavioral: Positive for memory loss. The patient is not nervous/anxious.     As per HPI. Otherwise, a complete review of systems is negative.  PAST  MEDICAL HISTORY: Past Medical History:  Diagnosis Date  . Anemia   . Arthritis   . Asthma   . Cancer ([place])   . Dyspnea   . GERD (gastroesophageal reflux disease)   . Hypertension     PAST SURGICAL HISTORY: Past Surgical History:  Procedure Laterality Date  . ABDOMINAL HYSTERECTOMY    . BREAST BIOPSY     x6  . BUNIONECTOMY Bilateral   . CATARACT EXTRACTION, BILATERAL    . FLEXIBLE SIGMOIDOSCOPY N/A 05/21/2017   Procedure: FLEXIBLE SIGMOIDOSCOPY;  Surgeon: Lin Landsman, MD;  Location: [facility] ENDOSCOPY;  Service: Gastroenterology;  Laterality: N/A;  . INCONTINENCE SURGERY    . PORTA CATH INSERTION N/A 06/16/2017   Procedure: PORTA CATH INSERTION;  Surgeon: Algernon Huxley, MD;  Location: [place] [facility];  Service: Cardiovascular;  Laterality: N/A;  . RECTAL SURGERY  04/2018  . REPAIR OF RECTAL PROLAPSE N/A 05/11/2017   Procedure: REPAIR OF RECTAL PROLAPSE;  Surgeon: Leonie Green, MD;  Location: [facility] ORS;  Service: General;  Laterality: N/A;  . TONSILLECTOMY    . VAGINA SURGERY     uncertain procedure performed    FAMILY HISTORY: Family History  Problem Relation Age of Onset  . Heart disease Mother   . Heart disease Father   . Heart disease Sister   . Heart attack Sister   . Ulcerative colitis Brother   . Lung cancer Brother   . Thyroid cancer Sister   . Asthma Sister   . Diabetes Sister   . Asthma Sister   . Pancreatic cancer Sister   . Dementia Sister   . Asthma Brother   . Heart disease Brother   . Asthma Brother   . Lung cancer  Brother   . Asthma Brother   . Lung cancer Brother   . Lung cancer Brother   . Rheum arthritis Brother     ADVANCED DIRECTIVES (Y/N):  N  HEALTH MAINTENANCE: Social History   Tobacco Use  . Smoking status: Never Smoker  . Smokeless tobacco: Never Used  Substance Use Topics  . Alcohol use: No  . Drug use: No     Colonoscopy:  PAP:  Bone density:  Lipid panel:  No Known Allergies  Current Outpatient  Medications  Medication Sig Dispense Refill  . acetaminophen (TYLENOL) 500 MG tablet Take 500 mg by mouth every 6 (six) hours as needed.    . cyclobenzaprine (FLEXERIL) 10 MG tablet Take 1 tablet (10 mg total) by mouth 3 (three) times daily as needed for muscle spasms. 30 tablet 1  . esomeprazole (NEXIUM) 40 MG capsule Take 40 mg by mouth daily before breakfast.     . lidocaine-prilocaine (EMLA) cream APPLY A SMALL AMOUNT TO PORT SITE AT LEAST 1 HOUR PRIOR TO IT BEING ACCESSED THEN COVER WITH PLASTIC WRAP 30 g 0  . montelukast (SINGULAIR) 10 MG tablet Take 10 mg by mouth at bedtime.    . ondansetron (ZOFRAN) 8 MG tablet Take 1 tablet (8 mg total) by mouth 2 (two) times daily as needed for nausea or vomiting. 30 tablet 2  . polyethylene glycol (MIRALAX / GLYCOLAX) packet Take by mouth.    . valsartan (DIOVAN) 160 MG tablet Start 1/2 tab a day.  Can increase to 1 tab after a few weeks if BP stays high     No current facility-administered medications for this visit.    Facility-Administered Medications Ordered in Other Visits  Medication Dose Route Frequency Provider Last Rate Last Dose  . sodium chloride flush (NS) 0.9 % injection 10 mL  10 mL Intravenous PRN Lloyd Huger, MD   10 mL at 03/10/18 1200    OBJECTIVE: Vitals:   06/08/19 1008  BP: 130/76  Pulse: 90  Temp: (!) 97.5 F (36.4 C)     Body mass index is 21.84 kg/m.    ECOG FS:0 - Asymptomatic  General: Thin, no acute distress. Eyes: Pink conjunctiva, anicteric sclera. HEENT: Normocephalic, moist mucous membranes, clear oropharnyx. Lungs: Clear to auscultation bilaterally. Heart: Regular rate and rhythm. No rubs, murmurs, or gallops. Abdomen: Soft, nontender, nondistended. No organomegaly noted, normoactive bowel sounds. Musculoskeletal: No edema, cyanosis, or clubbing. Neuro: Alert, answering all questions appropriately. Cranial nerves grossly intact. Skin: No rashes or petechiae noted. Psych: Normal affect.  LAB  RESULTS:  Lab Results  Component Value Date   NA 132 (L) 06/08/2019   K 4.4 06/08/2019   CL 101 06/08/2019   CO2 23 06/08/2019   GLUCOSE 104 (H) 06/08/2019   BUN 32 (H) 06/08/2019   CREATININE 0.95 06/08/2019   CALCIUM 8.7 (L) 06/08/2019   PROT 6.7 06/08/2019   ALBUMIN 4.0 06/08/2019   AST 24 06/08/2019   ALT 15 06/08/2019   ALKPHOS 79 06/08/2019   BILITOT 0.5 06/08/2019   GFRNONAA 53 (L) 06/08/2019   GFRAA >60 06/08/2019    Lab Results  Component Value Date   WBC 5.1 06/08/2019   NEUTROABS 3.3 06/08/2019   HGB 9.0 (L) 06/08/2019   HCT 28.1 (L) 06/08/2019   MCV 96.6 06/08/2019   PLT 517 (H) 06/08/2019     STUDIES: No results found.  ASSESSMENT: Stage IIIC ovarian cancer.  PLAN:    1. Stage IIIC ovarian cancer: CT scan  results from March 16, 2019 reviewed independently with marked progression of omental and peritoneal metastasis.  Patient CA-125 continues to trend down and is now 63.2.  Patient is now considered platinum refractory and is receiving single agent gemcitabine. Plan to give treatment on days 1, 8, and 15 with a 22 off.  Proceed with cycle 3, day 1 of treatment today.  Return to clinic in 1 week for further evaluation and consideration of cycle 3, day 8. 2. Lupus anticoagulant: Patient was noted to have an elevated PTT as well as increased bleeding during her surgery for rectal prolapse.   3. Anemia: Chronic and unchanged.  P patient's hemoglobin is 9.0 today.  Previously, iron stores are within normal limits.  Continue to monitor closely.   4.  Leukopenia: Resolved. Proceed with treatment as above.  Consider Udenyca in the future. 5.  Thrombocytopenia: Patient now has thrombocytosis.  Monitor. 6.  Dizziness: Patient does not complain of this today. 7.  Diarrhea: Chronic and unchanged.  Patient expressed understanding and was in agreement with this plan. She also understands that She can call clinic at any time with any questions, concerns, or complaints.    Cancer Staging Malignant neoplasm of ovary [facility]) Staging form: Ovary, Fallopian Tube, and Primary Peritoneal Carcinoma, AJCC 8th Edition - Clinical stage from 05/29/2017: Stage IIIC (cT3c, cN1b, cM0) - Signed by Lloyd Huger, MD on 05/29/2017   Lloyd Huger, MD   06/08/2019 3:17 PM

## 2019-06-07 ENCOUNTER — Other Ambulatory Visit: Payer: Self-pay

## 2019-06-07 NOTE — Progress Notes (Signed)
Patient pre screened for office appointment, no questions or concerns today. 

## 2019-06-08 ENCOUNTER — Inpatient Hospital Stay: Payer: Medicare Other

## 2019-06-08 ENCOUNTER — Inpatient Hospital Stay: Payer: Medicare Other | Admitting: *Deleted

## 2019-06-08 ENCOUNTER — Other Ambulatory Visit: Payer: Self-pay

## 2019-06-08 ENCOUNTER — Inpatient Hospital Stay (HOSPITAL_BASED_OUTPATIENT_CLINIC_OR_DEPARTMENT_OTHER): Payer: Medicare Other | Admitting: Oncology

## 2019-06-08 VITALS — BP 130/76 | HR 90 | Temp 97.5°F | Wt 102.7 lb

## 2019-06-08 DIAGNOSIS — C569 Malignant neoplasm of unspecified ovary: Secondary | ICD-10-CM

## 2019-06-08 DIAGNOSIS — Z23 Encounter for immunization: Secondary | ICD-10-CM

## 2019-06-08 DIAGNOSIS — Z95828 Presence of other vascular implants and grafts: Secondary | ICD-10-CM

## 2019-06-08 LAB — CBC WITH DIFFERENTIAL/PLATELET
Abs Immature Granulocytes: 0.02 10*3/uL (ref 0.00–0.07)
Basophils Absolute: 0 10*3/uL (ref 0.0–0.1)
Basophils Relative: 1 %
Eosinophils Absolute: 0 10*3/uL (ref 0.0–0.5)
Eosinophils Relative: 1 %
HCT: 28.1 % — ABNORMAL LOW (ref 36.0–46.0)
Hemoglobin: 9 g/dL — ABNORMAL LOW (ref 12.0–15.0)
Immature Granulocytes: 0 %
Lymphocytes Relative: 21 %
Lymphs Abs: 1.1 10*3/uL (ref 0.7–4.0)
MCH: 30.9 pg (ref 26.0–34.0)
MCHC: 32 g/dL (ref 30.0–36.0)
MCV: 96.6 fL (ref 80.0–100.0)
Monocytes Absolute: 0.6 10*3/uL (ref 0.1–1.0)
Monocytes Relative: 13 %
Neutro Abs: 3.3 10*3/uL (ref 1.7–7.7)
Neutrophils Relative %: 64 %
Platelets: 517 10*3/uL — ABNORMAL HIGH (ref 150–400)
RBC: 2.91 MIL/uL — ABNORMAL LOW (ref 3.87–5.11)
RDW: 18.3 % — ABNORMAL HIGH (ref 11.5–15.5)
WBC: 5.1 10*3/uL (ref 4.0–10.5)
nRBC: 0 % (ref 0.0–0.2)

## 2019-06-08 LAB — COMPREHENSIVE METABOLIC PANEL
ALT: 15 U/L (ref 0–44)
AST: 24 U/L (ref 15–41)
Albumin: 4 g/dL (ref 3.5–5.0)
Alkaline Phosphatase: 79 U/L (ref 38–126)
Anion gap: 8 (ref 5–15)
BUN: 32 mg/dL — ABNORMAL HIGH (ref 8–23)
CO2: 23 mmol/L (ref 22–32)
Calcium: 8.7 mg/dL — ABNORMAL LOW (ref 8.9–10.3)
Chloride: 101 mmol/L (ref 98–111)
Creatinine, Ser: 0.95 mg/dL (ref 0.44–1.00)
GFR calc Af Amer: >60 mL/min (ref >60)
GFR calc non Af Amer: 53 mL/min — ABNORMAL LOW (ref >60)
Glucose, Bld: 104 mg/dL — ABNORMAL HIGH (ref 70–99)
Potassium: 4.4 mmol/L (ref 3.5–5.1)
Sodium: 132 mmol/L — ABNORMAL LOW (ref 135–145)
Total Bilirubin: 0.5 mg/dL (ref 0.3–1.2)
Total Protein: 6.7 g/dL (ref 6.5–8.1)

## 2019-06-08 MED ORDER — SODIUM CHLORIDE 0.9 % IV SOLN
1000.0000 mg | Freq: Once | INTRAVENOUS | Status: AC
  Administered 2019-06-08: 988.5932 mg via INTRAVENOUS
  Filled 2019-06-08: qty 26

## 2019-06-08 MED ORDER — HEPARIN SOD (PORK) LOCK FLUSH 100 UNIT/ML IV SOLN
500.0000 [IU] | Freq: Once | INTRAVENOUS | Status: AC | PRN
  Administered 2019-06-08: 500 [IU]
  Filled 2019-06-08: qty 5

## 2019-06-08 MED ORDER — PROCHLORPERAZINE MALEATE 10 MG PO TABS
10.0000 mg | ORAL_TABLET | Freq: Once | ORAL | Status: AC
  Administered 2019-06-08: 10 mg via ORAL
  Filled 2019-06-08: qty 1

## 2019-06-08 MED ORDER — SODIUM CHLORIDE 0.9 % IV SOLN
Freq: Once | INTRAVENOUS | Status: AC
  Administered 2019-06-08: 10:00:00 via INTRAVENOUS
  Filled 2019-06-08: qty 250

## 2019-06-08 MED ORDER — INFLUENZA VAC A&B SA ADJ QUAD 0.5 ML IM PRSY
0.5000 mL | PREFILLED_SYRINGE | Freq: Once | INTRAMUSCULAR | Status: AC
  Administered 2019-06-08: 0.5 mL via INTRAMUSCULAR

## 2019-06-08 MED ORDER — SODIUM CHLORIDE 0.9% FLUSH
10.0000 mL | Freq: Once | INTRAVENOUS | Status: AC
  Administered 2019-06-08: 10 mL via INTRAVENOUS
  Filled 2019-06-08: qty 10

## 2019-06-09 LAB — CA 125: Cancer Antigen (CA) 125: 74.5 U/mL — ABNORMAL HIGH (ref 0.0–38.1)

## 2019-06-11 NOTE — Progress Notes (Signed)
[place]  Telephone:(336) [phone] Fax:(336) [phone]  ID: Tamara Mckinney OB: December 11, 1927  MR#: [id number]  [id number]  Patient Care Team: [name], MD as PCP - General (Family Medicine) [name], RN as Registered Nurse Herbert Pun, MD as Consulting Physician (General Surgery)  CHIEF COMPLAINT: Stage IIIC ovarian cancer.  INTERVAL HISTORY: Patient returns to clinic today for further evaluation and consideration of cycle 3, day 8 of single agent gemcitabine.  She continues to complain of memory loss, but otherwise feels well.  She is tolerating her treatments without significant side effects. She has no neurologic complaints.  She has a good appetite and her weight has remained stable.  She denies any chest pain, shortness of breath, cough, or hemoptysis.  She denies any further abdominal bloating.  She denies any nausea, vomiting, constipation.  She continues to have occasional diarrhea residual from her surgery many years ago.  She has no urinary complaints.  Patient offers no specific complaints today.  REVIEW OF SYSTEMS:   Review of Systems  Constitutional: Negative.  Negative for fever, malaise/fatigue and weight loss.  Respiratory: Negative.  Negative for cough and shortness of breath.   Cardiovascular: Negative.  Negative for chest pain and leg swelling.  Gastrointestinal: Positive for diarrhea. Negative for abdominal pain, blood in stool, constipation, heartburn, melena, nausea and vomiting.  Genitourinary: Negative.  Negative for dysuria.  Musculoskeletal: Negative.  Negative for back pain and neck pain.  Skin: Negative.  Negative for itching and rash.  Neurological: Negative.  Negative for dizziness, focal weakness and weakness.  Endo/Heme/Allergies: Does not bruise/bleed easily.  Psychiatric/Behavioral: Positive for memory loss. The patient is not nervous/anxious.     As per HPI. Otherwise, a complete review of systems is  negative.  PAST MEDICAL HISTORY: Past Medical History:  Diagnosis Date  . Anemia   . Arthritis   . Asthma   . Cancer ([place])   . Dyspnea   . GERD (gastroesophageal reflux disease)   . Hypertension     PAST SURGICAL HISTORY: Past Surgical History:  Procedure Laterality Date  . ABDOMINAL HYSTERECTOMY    . BREAST BIOPSY     x6  . BUNIONECTOMY Bilateral   . CATARACT EXTRACTION, BILATERAL    . FLEXIBLE SIGMOIDOSCOPY N/A 05/21/2017   Procedure: FLEXIBLE SIGMOIDOSCOPY;  Surgeon: Lin Landsman, MD;  Location: [facility] ENDOSCOPY;  Service: Gastroenterology;  Laterality: N/A;  . INCONTINENCE SURGERY    . PORTA CATH INSERTION N/A 06/16/2017   Procedure: PORTA CATH INSERTION;  Surgeon: Algernon Huxley, MD;  Location: [place] CV LAB;  Service: Cardiovascular;  Laterality: N/A;  . RECTAL SURGERY  04/2018  . REPAIR OF RECTAL PROLAPSE N/A 05/11/2017   Procedure: REPAIR OF RECTAL PROLAPSE;  Surgeon: Leonie Green, MD;  Location: [facility] ORS;  Service: General;  Laterality: N/A;  . TONSILLECTOMY    . VAGINA SURGERY     uncertain procedure performed    FAMILY HISTORY: Family History  Problem Relation Age of Onset  . Heart disease Mother   . Heart disease Father   . Heart disease Sister   . Heart attack Sister   . Ulcerative colitis Brother   . Lung cancer Brother   . Thyroid cancer Sister   . Asthma Sister   . Diabetes Sister   . Asthma Sister   . Pancreatic cancer Sister   . Dementia Sister   . Asthma Brother   . Heart disease Brother   . Asthma Brother   .  Lung cancer Brother   . Asthma Brother   . Lung cancer Brother   . Lung cancer Brother   . Rheum arthritis Brother     ADVANCED DIRECTIVES (Y/N):  N  HEALTH MAINTENANCE: Social History   Tobacco Use  . Smoking status: Never Smoker  . Smokeless tobacco: Never Used  Substance Use Topics  . Alcohol use: No  . Drug use: No     Colonoscopy:  PAP:  Bone density:  Lipid panel:  No Known Allergies  Current  Outpatient Medications  Medication Sig Dispense Refill  . acetaminophen (TYLENOL) 500 MG tablet Take 500 mg by mouth every 6 (six) hours as needed.    . cyclobenzaprine (FLEXERIL) 10 MG tablet Take 1 tablet (10 mg total) by mouth 3 (three) times daily as needed for muscle spasms. 30 tablet 1  . esomeprazole (NEXIUM) 40 MG capsule Take 40 mg by mouth daily before breakfast.     . lidocaine-prilocaine (EMLA) cream APPLY A SMALL AMOUNT TO PORT SITE AT LEAST 1 HOUR PRIOR TO IT BEING ACCESSED THEN COVER WITH PLASTIC WRAP 30 g 0  . montelukast (SINGULAIR) 10 MG tablet Take 10 mg by mouth at bedtime.    . ondansetron (ZOFRAN) 8 MG tablet Take 1 tablet (8 mg total) by mouth 2 (two) times daily as needed for nausea or vomiting. 30 tablet 2  . polyethylene glycol (MIRALAX / GLYCOLAX) packet Take by mouth.    . valsartan (DIOVAN) 160 MG tablet Start 1/2 tab a day.  Can increase to 1 tab after a few weeks if BP stays high     No current facility-administered medications for this visit.    Facility-Administered Medications Ordered in Other Visits  Medication Dose Route Frequency Provider Last Rate Last Dose  . sodium chloride flush (NS) 0.9 % injection 10 mL  10 mL Intravenous PRN Lloyd Huger, MD   10 mL at 03/10/18 1200    OBJECTIVE: Vitals:   06/15/19 1003  BP: (!) 144/78  Pulse: 94  Resp: 16  Temp: 98.8 F (37.1 C)  SpO2: 99%     Body mass index is 22.07 kg/m.    ECOG FS:0 - Asymptomatic  General: Thin, no acute distress. Eyes: Pink conjunctiva, anicteric sclera. HEENT: Normocephalic, moist mucous membranes. Lungs: Clear to auscultation bilaterally. Heart: Regular rate and rhythm. No rubs, murmurs, or gallops. Abdomen: Soft, nontender, nondistended. No organomegaly noted, normoactive bowel sounds. Musculoskeletal: No edema, cyanosis, or clubbing. Neuro: Alert, answering all questions appropriately. Cranial nerves grossly intact. Skin: No rashes or petechiae noted. Psych: Normal  affect.  LAB RESULTS:  Lab Results  Component Value Date   NA 133 (L) 06/15/2019   K 4.6 06/15/2019   CL 99 06/15/2019   CO2 24 06/15/2019   GLUCOSE 134 (H) 06/15/2019   BUN 22 06/15/2019   CREATININE 0.92 06/15/2019   CALCIUM 8.5 (L) 06/15/2019   PROT 6.5 06/15/2019   ALBUMIN 3.7 06/15/2019   AST 23 06/15/2019   ALT 13 06/15/2019   ALKPHOS 70 06/15/2019   BILITOT 0.4 06/15/2019   GFRNONAA 55 (L) 06/15/2019   GFRAA >60 06/15/2019    Lab Results  Component Value Date   WBC 2.9 (L) 06/15/2019   NEUTROABS 2.0 06/15/2019   HGB 8.3 (L) 06/15/2019   HCT 25.2 (L) 06/15/2019   MCV 96.6 06/15/2019   PLT 390 06/15/2019     STUDIES: No results found.  ASSESSMENT: Stage IIIC ovarian cancer.  PLAN:    1. Stage  IIIC ovarian cancer: CT scan results from March 16, 2019 reviewed independently with marked progression of omental and peritoneal metastasis.  CA-125 was trending down, but now has trended up slightly to 74.5.  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 Plan to give treatment on days 1, 8, and 15 with a 22 off.  Proceed with cycle 3, day 8 of treatment today.  Return to clinic in 1 week for further evaluation and consideration of cycle 3, day 15.  Plan to reimage at the conclusion of cycle 3.   2. Lupus anticoagulant: Patient was noted to have an elevated PTT as well as increased bleeding during her surgery for rectal prolapse.   3. Anemia: Patient's hemoglobin has trended down to 8.3, monitor.  Previously, iron stores are within normal limits.  4.  Leukopenia: Mild, proceed with treatment as above. 5.  Thrombocytopenia: Resolved. 6.  Diarrhea: Chronic and unchanged.  Patient expressed understanding and was in agreement with this plan. She also understands that She can call clinic at any time with any questions, concerns, or complaints.   Cancer Staging Malignant neoplasm of ovary [facility]) Staging form: Ovary, Fallopian Tube, and  Primary Peritoneal Carcinoma, AJCC 8th Edition - Clinical stage from 05/29/2017: Stage IIIC (cT3c, cN1b, cM0) - Signed by Lloyd Huger, MD on 05/29/2017   Lloyd Huger, MD   06/16/2019 6:39 AM

## 2019-06-14 ENCOUNTER — Other Ambulatory Visit: Payer: Self-pay

## 2019-06-14 NOTE — Progress Notes (Signed)
Patient pre screened for office appointment, no questions or concerns today. 

## 2019-06-15 ENCOUNTER — Inpatient Hospital Stay: Payer: Medicare Other

## 2019-06-15 ENCOUNTER — Inpatient Hospital Stay (HOSPITAL_BASED_OUTPATIENT_CLINIC_OR_DEPARTMENT_OTHER): Payer: Medicare Other | Admitting: Oncology

## 2019-06-15 ENCOUNTER — Inpatient Hospital Stay: Payer: Medicare Other | Admitting: *Deleted

## 2019-06-15 ENCOUNTER — Other Ambulatory Visit: Payer: Self-pay

## 2019-06-15 VITALS — BP 144/78 | HR 94 | Temp 98.8°F | Resp 16 | Wt 103.8 lb

## 2019-06-15 DIAGNOSIS — C569 Malignant neoplasm of unspecified ovary: Secondary | ICD-10-CM

## 2019-06-15 DIAGNOSIS — Z95828 Presence of other vascular implants and grafts: Secondary | ICD-10-CM

## 2019-06-15 LAB — COMPREHENSIVE METABOLIC PANEL
ALT: 13 U/L (ref 0–44)
AST: 23 U/L (ref 15–41)
Albumin: 3.7 g/dL (ref 3.5–5.0)
Alkaline Phosphatase: 70 U/L (ref 38–126)
Anion gap: 10 (ref 5–15)
BUN: 22 mg/dL (ref 8–23)
CO2: 24 mmol/L (ref 22–32)
Calcium: 8.5 mg/dL — ABNORMAL LOW (ref 8.9–10.3)
Chloride: 99 mmol/L (ref 98–111)
Creatinine, Ser: 0.92 mg/dL (ref 0.44–1.00)
GFR calc Af Amer: >60 mL/min (ref >60)
GFR calc non Af Amer: 55 mL/min — ABNORMAL LOW (ref >60)
Glucose, Bld: 134 mg/dL — ABNORMAL HIGH (ref 70–99)
Potassium: 4.6 mmol/L (ref 3.5–5.1)
Sodium: 133 mmol/L — ABNORMAL LOW (ref 135–145)
Total Bilirubin: 0.4 mg/dL (ref 0.3–1.2)
Total Protein: 6.5 g/dL (ref 6.5–8.1)

## 2019-06-15 LAB — CBC WITH DIFFERENTIAL/PLATELET
Abs Immature Granulocytes: 0.01 10*3/uL (ref 0.00–0.07)
Basophils Absolute: 0 10*3/uL (ref 0.0–0.1)
Basophils Relative: 1 %
Eosinophils Absolute: 0 10*3/uL (ref 0.0–0.5)
Eosinophils Relative: 1 %
HCT: 25.2 % — ABNORMAL LOW (ref 36.0–46.0)
Hemoglobin: 8.3 g/dL — ABNORMAL LOW (ref 12.0–15.0)
Immature Granulocytes: 0 %
Lymphocytes Relative: 26 %
Lymphs Abs: 0.8 10*3/uL (ref 0.7–4.0)
MCH: 31.8 pg (ref 26.0–34.0)
MCHC: 32.9 g/dL (ref 30.0–36.0)
MCV: 96.6 fL (ref 80.0–100.0)
Monocytes Absolute: 0.1 10*3/uL (ref 0.1–1.0)
Monocytes Relative: 4 %
Neutro Abs: 2 10*3/uL (ref 1.7–7.7)
Neutrophils Relative %: 68 %
Platelets: 390 10*3/uL (ref 150–400)
RBC: 2.61 MIL/uL — ABNORMAL LOW (ref 3.87–5.11)
RDW: 17.6 % — ABNORMAL HIGH (ref 11.5–15.5)
WBC: 2.9 10*3/uL — ABNORMAL LOW (ref 4.0–10.5)
nRBC: 0 % (ref 0.0–0.2)

## 2019-06-15 MED ORDER — SODIUM CHLORIDE 0.9 % IV SOLN
Freq: Once | INTRAVENOUS | Status: AC
  Administered 2019-06-15: 11:00:00 via INTRAVENOUS
  Filled 2019-06-15: qty 250

## 2019-06-15 MED ORDER — HEPARIN SOD (PORK) LOCK FLUSH 100 UNIT/ML IV SOLN
INTRAVENOUS | Status: AC
  Filled 2019-06-15: qty 5

## 2019-06-15 MED ORDER — SODIUM CHLORIDE 0.9 % IV SOLN
800.0000 mg/m2 | Freq: Once | INTRAVENOUS | Status: AC
  Administered 2019-06-15: 1064 mg via INTRAVENOUS
  Filled 2019-06-15: qty 26.3

## 2019-06-15 MED ORDER — HEPARIN SOD (PORK) LOCK FLUSH 100 UNIT/ML IV SOLN
500.0000 [IU] | Freq: Once | INTRAVENOUS | Status: AC
  Administered 2019-06-15: 12:00:00 500 [IU] via INTRAVENOUS

## 2019-06-15 MED ORDER — SODIUM CHLORIDE 0.9% FLUSH
10.0000 mL | Freq: Once | INTRAVENOUS | Status: AC
  Administered 2019-06-15: 10 mL via INTRAVENOUS
  Filled 2019-06-15: qty 10

## 2019-06-15 MED ORDER — PROCHLORPERAZINE MALEATE 10 MG PO TABS
10.0000 mg | ORAL_TABLET | Freq: Once | ORAL | Status: AC
  Administered 2019-06-15: 10 mg via ORAL
  Filled 2019-06-15: qty 1

## 2019-06-18 LAB — CA 125: Cancer Antigen (CA) 125: 71.4 U/mL — ABNORMAL HIGH (ref 0.0–38.1)

## 2019-06-18 NOTE — Progress Notes (Signed)
[facility]  Telephone:(336) [phone] Fax:(336) [phone]  ID: Tamara Mckinney OB: [birth year]  MR#: [id number]  [id number]  Patient Care Team: [name], MD as PCP - General (Family Medicine) [name], RN as Registered Nurse Herbert Pun, MD as Consulting Physician (General Surgery)  CHIEF COMPLAINT: Stage IIIC ovarian cancer.  INTERVAL HISTORY: Patient returns to clinic today for further evaluation and consideration of cycle 3, day 15 of single agent gemcitabine.  She continues to tolerate her treatments well without significant side effects. She has no neurologic complaints.  She has a good appetite and her weight has remained stable.  She denies any chest pain, shortness of breath, cough, or hemoptysis.  She denies any further abdominal bloating.  She denies any nausea, vomiting, constipation.  She continues to have occasional diarrhea residual from her surgery many years ago.  She has no urinary complaints.  Patient feels at her baseline offers no specific complaints today.  REVIEW OF SYSTEMS:   Review of Systems  Constitutional: Negative.  Negative for fever, malaise/fatigue and weight loss.  Respiratory: Negative.  Negative for cough and shortness of breath.   Cardiovascular: Negative.  Negative for chest pain and leg swelling.  Gastrointestinal: Positive for diarrhea. Negative for abdominal pain, blood in stool, constipation, heartburn, melena, nausea and vomiting.  Genitourinary: Negative.  Negative for dysuria.  Musculoskeletal: Negative.  Negative for back pain and neck pain.  Skin: Negative.  Negative for itching and rash.  Neurological: Negative.  Negative for dizziness, focal weakness and weakness.  Endo/Heme/Allergies: Does not bruise/bleed easily.  Psychiatric/Behavioral: Positive for memory loss. The patient is not nervous/anxious.     As per HPI. Otherwise, a complete review of systems is negative.  PAST MEDICAL HISTORY:  Past Medical History:  Diagnosis Date  . Anemia   . Arthritis   . Asthma   . Cancer ([place])   . Dyspnea   . GERD (gastroesophageal reflux disease)   . Hypertension     PAST SURGICAL HISTORY: Past Surgical History:  Procedure Laterality Date  . ABDOMINAL HYSTERECTOMY    . BREAST BIOPSY     x6  . BUNIONECTOMY Bilateral   . CATARACT EXTRACTION, BILATERAL    . FLEXIBLE SIGMOIDOSCOPY N/A 05/21/2017   Procedure: FLEXIBLE SIGMOIDOSCOPY;  Surgeon: Lin Landsman, MD;  Location: [facility] ENDOSCOPY;  Service: Gastroenterology;  Laterality: N/A;  . INCONTINENCE SURGERY    . PORTA CATH INSERTION N/A 06/16/2017   Procedure: PORTA CATH INSERTION;  Surgeon: Algernon Huxley, MD;  Location: [place] [facility] LAB;  Service: Cardiovascular;  Laterality: N/A;  . RECTAL SURGERY  04/2018  . REPAIR OF RECTAL PROLAPSE N/A 05/11/2017   Procedure: REPAIR OF RECTAL PROLAPSE;  Surgeon: Leonie Green, MD;  Location: [facility] ORS;  Service: General;  Laterality: N/A;  . TONSILLECTOMY    . VAGINA SURGERY     uncertain procedure performed    FAMILY HISTORY: Family History  Problem Relation Age of Onset  . Heart disease Mother   . Heart disease Father   . Heart disease Sister   . Heart attack Sister   . Ulcerative colitis Brother   . Lung cancer Brother   . Thyroid cancer Sister   . Asthma Sister   . Diabetes Sister   . Asthma Sister   . Pancreatic cancer Sister   . Dementia Sister   . Asthma Brother   . Heart disease Brother   . Asthma Brother   . Lung cancer Brother   .  Asthma Brother   . Lung cancer Brother   . Lung cancer Brother   . Rheum arthritis Brother     ADVANCED DIRECTIVES (Y/N):  N  HEALTH MAINTENANCE: Social History   Tobacco Use  . Smoking status: Never Smoker  . Smokeless tobacco: Never Used  Substance Use Topics  . Alcohol use: No  . Drug use: No     Colonoscopy:  PAP:  Bone density:  Lipid panel:  No Known Allergies  Current Outpatient Medications   Medication Sig Dispense Refill  . acetaminophen (TYLENOL) 500 MG tablet Take 500 mg by mouth every 6 (six) hours as needed.    . cyclobenzaprine (FLEXERIL) 10 MG tablet Take 1 tablet (10 mg total) by mouth 3 (three) times daily as needed for muscle spasms. 30 tablet 1  . esomeprazole (NEXIUM) 40 MG capsule Take 40 mg by mouth daily before breakfast.     . lidocaine-prilocaine (EMLA) cream APPLY A SMALL AMOUNT TO PORT SITE AT LEAST 1 HOUR PRIOR TO IT BEING ACCESSED THEN COVER WITH PLASTIC WRAP 30 g 0  . montelukast (SINGULAIR) 10 MG tablet Take 10 mg by mouth at bedtime.    . ondansetron (ZOFRAN) 8 MG tablet Take 1 tablet (8 mg total) by mouth 2 (two) times daily as needed for nausea or vomiting. 30 tablet 2  . polyethylene glycol (MIRALAX / GLYCOLAX) packet Take by mouth.    . valsartan (DIOVAN) 160 MG tablet Start 1/2 tab a day.  Can increase to 1 tab after a few weeks if BP stays high     No current facility-administered medications for this visit.    Facility-Administered Medications Ordered in Other Visits  Medication Dose Route Frequency Provider Last Rate Last Dose  . sodium chloride flush (NS) 0.9 % injection 10 mL  10 mL Intravenous PRN Lloyd Huger, MD   10 mL at 03/10/18 1200    OBJECTIVE: Vitals:   06/21/19 1306  BP: 134/70  Pulse: 99  Resp: 18  Temp: 98.1 F (36.7 C)  SpO2: 100%     Body mass index is 21.88 kg/m.    ECOG FS:0 - Asymptomatic  General: Thin, no acute distress. Eyes: Pink conjunctiva, anicteric sclera. HEENT: Normocephalic, moist mucous membranes. Lungs: Clear to auscultation bilaterally. Heart: Regular rate and rhythm. No rubs, murmurs, or gallops. Abdomen: Soft, nontender, nondistended. No organomegaly noted, normoactive bowel sounds. Musculoskeletal: No edema, cyanosis, or clubbing. Neuro: Alert, answering all questions appropriately. Cranial nerves grossly intact. Skin: No rashes or petechiae noted. Psych: Normal affect.  LAB RESULTS:   Lab Results  Component Value Date   NA 132 (L) 06/22/2019   K 4.2 06/22/2019   CL 101 06/22/2019   CO2 22 06/22/2019   GLUCOSE 133 (H) 06/22/2019   BUN 26 (H) 06/22/2019   CREATININE 0.98 06/22/2019   CALCIUM 8.7 (L) 06/22/2019   PROT 6.7 06/22/2019   ALBUMIN 4.0 06/22/2019   AST 25 06/22/2019   ALT 15 06/22/2019   ALKPHOS 79 06/22/2019   BILITOT 0.5 06/22/2019   GFRNONAA 51 (L) 06/22/2019   GFRAA 59 (L) 06/22/2019    Lab Results  Component Value Date   WBC 2.0 (L) 06/22/2019   NEUTROABS 1.3 (L) 06/22/2019   HGB 8.7 (L) 06/22/2019   HCT 26.8 (L) 06/22/2019   MCV 97.1 06/22/2019   PLT 142 (L) 06/22/2019     STUDIES: No results found.  ASSESSMENT: Stage IIIC ovarian cancer.  PLAN:    1. Stage IIIC ovarian cancer:  CT scan results from March 16, 2019 reviewed independently with marked progression of omental and peritoneal metastasis.  CA-125 seems to have plateaued ranging between 63 and 74.   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 Plan to give treatment on days 1, 8, and 15 with a 22 off.  Proceed with cycle 3, day 15 of treatment today.  Return to clinic in 2 weeks for further evaluation and consideration of cycle 4, day 1.  Will reimage 1 to 2 days prior to next appointment.   2. Lupus anticoagulant: Patient was noted to have an elevated PTT as well as increased bleeding during her surgery for rectal prolapse.   3. Anemia: Chronic and unchanged.  Patient's hemoglobin is 8.7 today.  Previously, iron stores are within normal limits.  4.  Neutropenia: Patient's ANC is 1.3 today proceed with treatment as above. 5.  Thrombocytopenia: Mild, monitor. 6.  Diarrhea: Chronic and unchanged.  Patient expressed understanding and was in agreement with this plan. She also understands that She can call clinic at any time with any questions, concerns, or complaints.   Cancer Staging Malignant neoplasm of ovary [facility]) Staging form: Ovary,  Fallopian Tube, and Primary Peritoneal Carcinoma, AJCC 8th Edition - Clinical stage from 05/29/2017: Stage IIIC (cT3c, cN1b, cM0) - Signed by Lloyd Huger, MD on 05/29/2017   Lloyd Huger, MD   06/22/2019 10:13 AM

## 2019-06-21 ENCOUNTER — Other Ambulatory Visit: Payer: Self-pay

## 2019-06-21 NOTE — Progress Notes (Signed)
Patient prescreened for appointment. Patient has no concerns or questions.  

## 2019-06-22 ENCOUNTER — Inpatient Hospital Stay: Payer: Medicare Other

## 2019-06-22 ENCOUNTER — Inpatient Hospital Stay (HOSPITAL_BASED_OUTPATIENT_CLINIC_OR_DEPARTMENT_OTHER): Payer: Medicare Other | Admitting: Oncology

## 2019-06-22 ENCOUNTER — Other Ambulatory Visit: Payer: Self-pay

## 2019-06-22 ENCOUNTER — Inpatient Hospital Stay: Payer: Medicare Other | Attending: Oncology | Admitting: *Deleted

## 2019-06-22 VITALS — BP 134/70 | HR 99 | Temp 98.1°F | Resp 18

## 2019-06-22 VITALS — BP 134/70 | HR 99 | Temp 98.1°F | Resp 18 | Wt 102.9 lb

## 2019-06-22 DIAGNOSIS — C569 Malignant neoplasm of unspecified ovary: Secondary | ICD-10-CM

## 2019-06-22 DIAGNOSIS — R413 Other amnesia: Secondary | ICD-10-CM | POA: Insufficient documentation

## 2019-06-22 DIAGNOSIS — Z801 Family history of malignant neoplasm of trachea, bronchus and lung: Secondary | ICD-10-CM | POA: Diagnosis not present

## 2019-06-22 DIAGNOSIS — I10 Essential (primary) hypertension: Secondary | ICD-10-CM | POA: Diagnosis not present

## 2019-06-22 DIAGNOSIS — D696 Thrombocytopenia, unspecified: Secondary | ICD-10-CM | POA: Diagnosis not present

## 2019-06-22 DIAGNOSIS — Z5111 Encounter for antineoplastic chemotherapy: Secondary | ICD-10-CM | POA: Diagnosis not present

## 2019-06-22 DIAGNOSIS — K219 Gastro-esophageal reflux disease without esophagitis: Secondary | ICD-10-CM | POA: Diagnosis not present

## 2019-06-22 DIAGNOSIS — J45909 Unspecified asthma, uncomplicated: Secondary | ICD-10-CM | POA: Insufficient documentation

## 2019-06-22 DIAGNOSIS — Z95828 Presence of other vascular implants and grafts: Secondary | ICD-10-CM

## 2019-06-22 DIAGNOSIS — Z79899 Other long term (current) drug therapy: Secondary | ICD-10-CM | POA: Diagnosis not present

## 2019-06-22 DIAGNOSIS — M1990 Unspecified osteoarthritis, unspecified site: Secondary | ICD-10-CM | POA: Diagnosis not present

## 2019-06-22 DIAGNOSIS — D649 Anemia, unspecified: Secondary | ICD-10-CM | POA: Insufficient documentation

## 2019-06-22 DIAGNOSIS — R791 Abnormal coagulation profile: Secondary | ICD-10-CM | POA: Insufficient documentation

## 2019-06-22 DIAGNOSIS — R197 Diarrhea, unspecified: Secondary | ICD-10-CM | POA: Insufficient documentation

## 2019-06-22 LAB — CBC WITH DIFFERENTIAL/PLATELET
Abs Immature Granulocytes: 0.01 10*3/uL (ref 0.00–0.07)
Basophils Absolute: 0 10*3/uL (ref 0.0–0.1)
Basophils Relative: 1 %
Eosinophils Absolute: 0 10*3/uL (ref 0.0–0.5)
Eosinophils Relative: 1 %
HCT: 26.8 % — ABNORMAL LOW (ref 36.0–46.0)
Hemoglobin: 8.7 g/dL — ABNORMAL LOW (ref 12.0–15.0)
Immature Granulocytes: 1 %
Lymphocytes Relative: 30 %
Lymphs Abs: 0.6 10*3/uL — ABNORMAL LOW (ref 0.7–4.0)
MCH: 31.5 pg (ref 26.0–34.0)
MCHC: 32.5 g/dL (ref 30.0–36.0)
MCV: 97.1 fL (ref 80.0–100.0)
Monocytes Absolute: 0.1 10*3/uL (ref 0.1–1.0)
Monocytes Relative: 5 %
Neutro Abs: 1.3 10*3/uL — ABNORMAL LOW (ref 1.7–7.7)
Neutrophils Relative %: 62 %
Platelets: 142 10*3/uL — ABNORMAL LOW (ref 150–400)
RBC: 2.76 MIL/uL — ABNORMAL LOW (ref 3.87–5.11)
RDW: 16.9 % — ABNORMAL HIGH (ref 11.5–15.5)
WBC: 2 10*3/uL — ABNORMAL LOW (ref 4.0–10.5)
nRBC: 0 % (ref 0.0–0.2)

## 2019-06-22 LAB — COMPREHENSIVE METABOLIC PANEL
ALT: 15 U/L (ref 0–44)
AST: 25 U/L (ref 15–41)
Albumin: 4 g/dL (ref 3.5–5.0)
Alkaline Phosphatase: 79 U/L (ref 38–126)
Anion gap: 9 (ref 5–15)
BUN: 26 mg/dL — ABNORMAL HIGH (ref 8–23)
CO2: 22 mmol/L (ref 22–32)
Calcium: 8.7 mg/dL — ABNORMAL LOW (ref 8.9–10.3)
Chloride: 101 mmol/L (ref 98–111)
Creatinine, Ser: 0.98 mg/dL (ref 0.44–1.00)
GFR calc Af Amer: 59 mL/min — ABNORMAL LOW (ref >60)
GFR calc non Af Amer: 51 mL/min — ABNORMAL LOW (ref >60)
Glucose, Bld: 133 mg/dL — ABNORMAL HIGH (ref 70–99)
Potassium: 4.2 mmol/L (ref 3.5–5.1)
Sodium: 132 mmol/L — ABNORMAL LOW (ref 135–145)
Total Bilirubin: 0.5 mg/dL (ref 0.3–1.2)
Total Protein: 6.7 g/dL (ref 6.5–8.1)

## 2019-06-22 MED ORDER — SODIUM CHLORIDE 0.9 % IV SOLN
Freq: Once | INTRAVENOUS | Status: AC
  Administered 2019-06-22: 10:00:00 via INTRAVENOUS
  Filled 2019-06-22: qty 250

## 2019-06-22 MED ORDER — SODIUM CHLORIDE 0.9% FLUSH
10.0000 mL | Freq: Once | INTRAVENOUS | Status: AC
  Administered 2019-06-22: 10 mL via INTRAVENOUS
  Filled 2019-06-22: qty 10

## 2019-06-22 MED ORDER — SODIUM CHLORIDE 0.9 % IV SOLN
800.0000 mg/m2 | Freq: Once | INTRAVENOUS | Status: AC
  Administered 2019-06-22: 1064 mg via INTRAVENOUS
  Filled 2019-06-22: qty 26.3

## 2019-06-22 MED ORDER — HEPARIN SOD (PORK) LOCK FLUSH 100 UNIT/ML IV SOLN
500.0000 [IU] | Freq: Once | INTRAVENOUS | Status: AC | PRN
  Administered 2019-06-22: 500 [IU]
  Filled 2019-06-22: qty 5

## 2019-06-22 MED ORDER — PROCHLORPERAZINE MALEATE 10 MG PO TABS
10.0000 mg | ORAL_TABLET | Freq: Once | ORAL | Status: AC
  Administered 2019-06-22: 10 mg via ORAL
  Filled 2019-06-22: qty 1

## 2019-06-29 ENCOUNTER — Ambulatory Visit: Payer: Medicare Other

## 2019-06-29 ENCOUNTER — Ambulatory Visit: Payer: Medicare Other | Admitting: Oncology

## 2019-06-29 ENCOUNTER — Other Ambulatory Visit: Payer: Medicare Other

## 2019-07-01 NOTE — Progress Notes (Signed)
[place]  [phone] Fax:(336) [phone]  ID: Tamara Mckinney OB: 1927-09-20  MR#: [id number]  [id number]  Patient Care Team: [name], MD as PCP - General (Family Medicine) [name], RN as Registered Nurse Herbert Pun, MD as Consulting Physician (General Surgery)  CHIEF COMPLAINT: Stage IIIC ovarian cancer.  INTERVAL HISTORY: Patient returns to clinic today for further evaluation and discussion of her imaging results.  She continues to feel well and remains asymptomatic.  She is tolerating her treatments without significant side effects. She has no neurologic complaints.  She has a good appetite and her weight has remained stable.  She denies any chest pain, shortness of breath, cough, or hemoptysis.  She denies any further abdominal bloating.  She denies any nausea, vomiting, constipation.  She continues to have occasional diarrhea residual from her surgery many years ago.  She has no urinary complaints.  Patient offers no specific complaints today.  REVIEW OF SYSTEMS:   Review of Systems  Constitutional: Negative.  Negative for fever, malaise/fatigue and weight loss.  Respiratory: Negative.  Negative for cough and shortness of breath.   Cardiovascular: Negative.  Negative for chest pain and leg swelling.  Gastrointestinal: Positive for diarrhea. Negative for abdominal pain, blood in stool, constipation, heartburn, melena, nausea and vomiting.  Genitourinary: Negative.  Negative for dysuria.  Musculoskeletal: Negative.  Negative for back pain and neck pain.  Skin: Negative.  Negative for itching and rash.  Neurological: Negative.  Negative for dizziness, focal weakness and weakness.  Endo/Heme/Allergies: Does not bruise/bleed easily.  Psychiatric/Behavioral: Positive for memory loss. The patient is not nervous/anxious.     As per HPI. Otherwise, a complete review of systems is negative.  PAST MEDICAL HISTORY: Past  Medical History:  Diagnosis Date   Anemia    Arthritis    Asthma    Dyspnea    GERD (gastroesophageal reflux disease)    Hypertension    Ovarian cancer ([place]) 2018    PAST SURGICAL HISTORY: Past Surgical History:  Procedure Laterality Date   ABDOMINAL HYSTERECTOMY     BREAST BIOPSY     x6   BUNIONECTOMY Bilateral    CATARACT EXTRACTION, BILATERAL     FLEXIBLE SIGMOIDOSCOPY N/A 05/21/2017   Procedure: FLEXIBLE SIGMOIDOSCOPY;  Surgeon: Lin Landsman, MD;  Location: [facility];  Service: Gastroenterology;  Laterality: N/A;   INCONTINENCE SURGERY     PORTA CATH INSERTION N/A 06/16/2017   Procedure: PORTA CATH INSERTION;  Surgeon: Algernon Huxley, MD;  Location: [facility];  Service: Cardiovascular;  Laterality: N/A;   RECTAL SURGERY  04/2018   REPAIR OF RECTAL PROLAPSE N/A 05/11/2017   Procedure: REPAIR OF RECTAL PROLAPSE;  Surgeon: Leonie Green, MD;  Location: [facility];  Service: General;  Laterality: N/A;   TONSILLECTOMY     VAGINA SURGERY     uncertain procedure performed    FAMILY HISTORY: Family History  Problem Relation Age of Onset   Heart disease Mother    Heart disease Father    Heart disease Sister    Heart attack Sister    Ulcerative colitis Brother    Lung cancer Brother    Thyroid cancer Sister    Asthma Sister    Diabetes Sister    Asthma Sister    Pancreatic cancer Sister    Dementia Sister    Asthma Brother    Heart disease Brother    Asthma Brother    Lung cancer Brother    Asthma  Brother    Lung cancer Brother    Lung cancer Brother    Rheum arthritis Brother     ADVANCED DIRECTIVES (Y/N):  N  HEALTH MAINTENANCE: Social History   Tobacco Use   Smoking status: Never Smoker   Smokeless tobacco: Never Used  Substance Use Topics   Alcohol use: No   Drug use: No     Colonoscopy:  PAP:  Bone density:  Lipid panel:  No Known Allergies  Current Outpatient Medications    Medication Sig Dispense Refill   acetaminophen (TYLENOL) 500 MG tablet Take 500 mg by mouth every 6 (six) hours as needed.     cyclobenzaprine (FLEXERIL) 10 MG tablet Take 1 tablet (10 mg total) by mouth 3 (three) times daily as needed for muscle spasms. 30 tablet 1   esomeprazole (NEXIUM) 40 MG capsule Take 40 mg by mouth daily before breakfast.      lidocaine-prilocaine (EMLA) cream APPLY A SMALL AMOUNT TO PORT SITE AT LEAST 1 HOUR PRIOR TO IT BEING ACCESSED THEN COVER WITH PLASTIC WRAP 30 g 0   montelukast (SINGULAIR) 10 MG tablet Take 10 mg by mouth at bedtime.     ondansetron (ZOFRAN) 8 MG tablet Take 1 tablet (8 mg total) by mouth 2 (two) times daily as needed for nausea or vomiting. 30 tablet 2   polyethylene glycol (MIRALAX / GLYCOLAX) packet Take by mouth.     valsartan (DIOVAN) 160 MG tablet Start 1/2 tab a day.  Can increase to 1 tab after a few weeks if BP stays high     No current facility-administered medications for this visit.    Facility-Administered Medications Ordered in Other Visits  Medication Dose Route Frequency Provider Last Rate Last Dose   sodium chloride flush (NS) 0.9 % injection 10 mL  10 mL Intravenous PRN Lloyd Huger, MD   10 mL at 03/10/18 1200    OBJECTIVE: Vitals:   07/06/19 1051  BP: (!) 142/84  Pulse: 89  Resp: 14  Temp: (!) 97.1 F (36.2 C)  SpO2: 99%     Body mass index is 21.65 kg/m.    ECOG FS:0 - Asymptomatic  General: Thin, no acute distress. Eyes: Pink conjunctiva, anicteric sclera. HEENT: Normocephalic, moist mucous membranes. Lungs: Clear to auscultation bilaterally. Heart: Regular rate and rhythm. No rubs, murmurs, or gallops. Abdomen: Soft, nontender, nondistended. No organomegaly noted, normoactive bowel sounds. Musculoskeletal: No edema, cyanosis, or clubbing. Neuro: Alert, answering all questions appropriately. Cranial nerves grossly intact. Skin: No rashes or petechiae noted. Psych: Normal affect.  LAB  RESULTS:  Lab Results  Component Value Date   NA 128 (L) 07/06/2019   K 4.7 07/06/2019   CL 98 07/06/2019   CO2 23 07/06/2019   GLUCOSE 120 (H) 07/06/2019   BUN 25 (H) 07/06/2019   CREATININE 0.87 07/06/2019   CALCIUM 8.5 (L) 07/06/2019   PROT 6.7 07/06/2019   ALBUMIN 4.1 07/06/2019   AST 24 07/06/2019   ALT 14 07/06/2019   ALKPHOS 75 07/06/2019   BILITOT 0.5 07/06/2019   GFRNONAA 59 (L) 07/06/2019   GFRAA >60 07/06/2019    Lab Results  Component Value Date   WBC 4.9 07/06/2019   NEUTROABS 3.4 07/06/2019   HGB 9.0 (L) 07/06/2019   HCT 28.2 (L) 07/06/2019   MCV 100.0 07/06/2019   PLT 449 (H) 07/06/2019     STUDIES: Ct Abdomen Pelvis W Contrast  Result Date: 07/03/2019 CLINICAL DATA:  Follow-up ovarian cancer EXAM: CT ABDOMEN AND PELVIS  WITH CONTRAST TECHNIQUE: Multidetector CT imaging of the abdomen and pelvis was performed using the standard protocol following bolus administration of intravenous contrast. CONTRAST:  40mL OMNIPAQUE IOHEXOL 300 MG/ML  SOLN COMPARISON:  03/16/2019 FINDINGS: Lower chest: Lung bases are clear. Hepatobiliary: Liver is within normal limits. Gallbladder is unremarkable. No intrahepatic or extrahepatic ductal dilatation. Pancreas: Within normal limits. Spleen: Calcified granuloma.  Otherwise benign. Adrenals/Urinary Tract: Adrenal glands are within normal limits. Kidneys are within normal limits.  No hydronephrosis. Low-lying bladder with cystocele at rest (sagittal image 87). Stomach/Bowel: Large hiatal hernia/inverted intrathoracic stomach, incompletely visualized. No evidence of bowel obstruction. Appendix is not discretely visualized. Status post left hemicolectomy with suture line in the lower pelvis (series 2/image 62). Vascular/Lymphatic: No evidence of abdominal aortic aneurysm. Atherosclerotic calcifications of the abdominal aorta and branch vessels. No suspicious abdominopelvic lymphadenopathy. Reproductive: Status post hysterectomy. 3.0 cm left  ovarian cystic lesion (series 2/image 40), similar to the prior. No right adnexal mass. Other: Mixed cystic/solid peritoneal metastases in the abdomen/pelvis. While there is a somewhat mixed response, there is a general trend for overall mild improvement, with index lesions as follows: --1.5 cm solid mass anterior to the spleen in the left upper abdomen (series 2/image 11), previously 3.1 cm, improved --1.5 cm solid mass medial to the posterior right hepatic lobe (series 2/image 14), previously 2.5 cm and mixed cystic/solid, improved --4.8 x 4.0 cm mixed cystic/solid mass anterior to the spleen and adjacent to the stomach (series 2/image 16), previously 5.4 x 4.3 cm, improved --5.8 x 5.0 cm predominantly cystic mass anterior to spleen and lateral to the pancreatic tail (series 2/image 19), previously 4.1 x 3.7 cm and mixed cystic/solid, progressive but with decreased solid component --4.3 x 5.1 cm predominantly cystic mass beneath the left mid anterior abdominal wall (series 2/image 29), previously 4.6 x 6.7 cm, improved --2.9 cm predominantly cystic lesion lateral to the left psoas muscle (series 2/image 29), previously 3.2 cm, improved Musculoskeletal: S shaped thoracolumbar scoliosis with mild degenerative changes. IMPRESSION: Mixed cystic/solid peritoneal metastases in the abdomen/pelvis, with somewhat mixed response, but general trend towards overall mild improvement. Index lesions are described above. Status post hysterectomy. No right adnexal mass. 3.0 cm left ovarian cystic lesion, similar to the prior. Additional ancillary findings as above. Electronically Signed   By: Julian Hy M.D.   On: 07/03/2019 19:53    ASSESSMENT: Stage IIIC ovarian cancer.  PLAN:    1. Stage IIIC ovarian cancer: CT scan results from July 03, 2019 reviewed independently and reported as above with a mixed response to treatment, but overall mild improvement.  CA-125 seems to have plateaued ranging between 63 and 74,  today's result is pending.   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 Plan to give treatment on days 1, 8, and 15 with a 22 off.  Patient wishes to have the holidays off of treatment, therefore will return to clinic the first week of January for further evaluation and reinitiation of treatment which will be cycle 4, day 1. 2. Lupus anticoagulant: Patient was noted to have an elevated PTT as well as increased bleeding during her surgery for rectal prolapse.   3. Anemia: Chronic and unchanged.  Hemoglobin has trended up to 9.0.  Previously, iron stores are within normal limits.  4.  Neutropenia: Resolved. 5.  Thrombocytopenia: Resolved. 6.  Diarrhea: Chronic and unchanged.  Patient expressed understanding and was in agreement with this plan. She also understands that She can call  clinic at any time with any questions, concerns, or complaints.   Cancer Staging Malignant neoplasm of ovary [facility]) Staging form: Ovary, Fallopian Tube, and Primary Peritoneal Carcinoma, AJCC 8th Edition - Clinical stage from 05/29/2017: Stage IIIC (cT3c, cN1b, cM0) - Signed by Lloyd Huger, MD on 05/29/2017   Lloyd Huger, MD   07/07/2019 6:37 AM

## 2019-07-03 ENCOUNTER — Ambulatory Visit
Admission: RE | Admit: 2019-07-03 | Discharge: 2019-07-03 | Disposition: A | Discharge status: Home / Self Care | Payer: Medicare Other | Source: Ambulatory Visit | Attending: Oncology | Admitting: Oncology

## 2019-07-03 ENCOUNTER — Other Ambulatory Visit: Payer: Self-pay

## 2019-07-03 DIAGNOSIS — D649 Anemia, unspecified: Secondary | ICD-10-CM | POA: Insufficient documentation

## 2019-07-03 DIAGNOSIS — D709 Neutropenia, unspecified: Secondary | ICD-10-CM | POA: Diagnosis not present

## 2019-07-03 DIAGNOSIS — D696 Thrombocytopenia, unspecified: Secondary | ICD-10-CM | POA: Insufficient documentation

## 2019-07-03 DIAGNOSIS — R197 Diarrhea, unspecified: Secondary | ICD-10-CM | POA: Insufficient documentation

## 2019-07-03 DIAGNOSIS — C569 Malignant neoplasm of unspecified ovary: Secondary | ICD-10-CM | POA: Insufficient documentation

## 2019-07-03 MED ORDER — IOHEXOL 300 MG/ML  SOLN
80.0000 mL | Freq: Once | INTRAMUSCULAR | Status: AC | PRN
  Administered 2019-07-03: 80 mL via INTRAVENOUS

## 2019-07-05 ENCOUNTER — Other Ambulatory Visit: Payer: Self-pay

## 2019-07-05 NOTE — Progress Notes (Signed)
Patient pre screened for office appointment, no questions or concerns today. Patient reminded of upcoming appointment time and date. 

## 2019-07-06 ENCOUNTER — Inpatient Hospital Stay (HOSPITAL_BASED_OUTPATIENT_CLINIC_OR_DEPARTMENT_OTHER): Payer: Medicare Other | Admitting: Oncology

## 2019-07-06 ENCOUNTER — Inpatient Hospital Stay: Payer: Medicare Other

## 2019-07-06 ENCOUNTER — Other Ambulatory Visit: Payer: Self-pay

## 2019-07-06 ENCOUNTER — Inpatient Hospital Stay: Payer: Medicare Other | Admitting: *Deleted

## 2019-07-06 ENCOUNTER — Encounter: Payer: Self-pay | Admitting: Oncology

## 2019-07-06 VITALS — BP 142/84 | HR 89 | Temp 97.1°F | Resp 14 | Wt 101.8 lb

## 2019-07-06 DIAGNOSIS — C569 Malignant neoplasm of unspecified ovary: Secondary | ICD-10-CM | POA: Diagnosis not present

## 2019-07-06 DIAGNOSIS — Z95828 Presence of other vascular implants and grafts: Secondary | ICD-10-CM

## 2019-07-06 LAB — COMPREHENSIVE METABOLIC PANEL
ALT: 14 U/L (ref 0–44)
AST: 24 U/L (ref 15–41)
Albumin: 4.1 g/dL (ref 3.5–5.0)
Alkaline Phosphatase: 75 U/L (ref 38–126)
Anion gap: 7 (ref 5–15)
BUN: 25 mg/dL — ABNORMAL HIGH (ref 8–23)
CO2: 23 mmol/L (ref 22–32)
Calcium: 8.5 mg/dL — ABNORMAL LOW (ref 8.9–10.3)
Chloride: 98 mmol/L (ref 98–111)
Creatinine, Ser: 0.87 mg/dL (ref 0.44–1.00)
GFR calc Af Amer: >60 mL/min (ref >60)
GFR calc non Af Amer: 59 mL/min — ABNORMAL LOW (ref >60)
Glucose, Bld: 120 mg/dL — ABNORMAL HIGH (ref 70–99)
Potassium: 4.7 mmol/L (ref 3.5–5.1)
Sodium: 128 mmol/L — ABNORMAL LOW (ref 135–145)
Total Bilirubin: 0.5 mg/dL (ref 0.3–1.2)
Total Protein: 6.7 g/dL (ref 6.5–8.1)

## 2019-07-06 LAB — CBC WITH DIFFERENTIAL/PLATELET
Abs Immature Granulocytes: 0.03 10*3/uL (ref 0.00–0.07)
Basophils Absolute: 0 10*3/uL (ref 0.0–0.1)
Basophils Relative: 1 %
Eosinophils Absolute: 0 10*3/uL (ref 0.0–0.5)
Eosinophils Relative: 0 %
HCT: 28.2 % — ABNORMAL LOW (ref 36.0–46.0)
Hemoglobin: 9 g/dL — ABNORMAL LOW (ref 12.0–15.0)
Immature Granulocytes: 1 %
Lymphocytes Relative: 16 %
Lymphs Abs: 0.8 10*3/uL (ref 0.7–4.0)
MCH: 31.9 pg (ref 26.0–34.0)
MCHC: 31.9 g/dL (ref 30.0–36.0)
MCV: 100 fL (ref 80.0–100.0)
Monocytes Absolute: 0.6 10*3/uL (ref 0.1–1.0)
Monocytes Relative: 13 %
Neutro Abs: 3.4 10*3/uL (ref 1.7–7.7)
Neutrophils Relative %: 69 %
Platelets: 449 10*3/uL — ABNORMAL HIGH (ref 150–400)
RBC: 2.82 MIL/uL — ABNORMAL LOW (ref 3.87–5.11)
RDW: 17.8 % — ABNORMAL HIGH (ref 11.5–15.5)
WBC: 4.9 10*3/uL (ref 4.0–10.5)
nRBC: 0 % (ref 0.0–0.2)

## 2019-07-06 MED ORDER — HEPARIN SOD (PORK) LOCK FLUSH 100 UNIT/ML IV SOLN
500.0000 [IU] | Freq: Once | INTRAVENOUS | Status: AC
  Administered 2019-07-06: 500 [IU] via INTRAVENOUS

## 2019-07-06 MED ORDER — SODIUM CHLORIDE 0.9% FLUSH
10.0000 mL | Freq: Once | INTRAVENOUS | Status: AC
  Administered 2019-07-06: 10 mL via INTRAVENOUS
  Filled 2019-07-06: qty 10

## 2019-07-12 ENCOUNTER — Ambulatory Visit: Payer: Medicare Other

## 2019-07-12 ENCOUNTER — Other Ambulatory Visit: Payer: Medicare Other

## 2019-07-12 ENCOUNTER — Ambulatory Visit: Payer: Medicare Other | Admitting: Oncology

## 2019-08-18 NOTE — Progress Notes (Signed)
[place]  Telephone:(336) [phone] Fax:(336) [phone]  ID: Tamara Mckinney OB: 04-10-1928  MR#: [id number]  [id number]  Patient Care Team: [name], MD as PCP - General (Family Medicine) [name], RN as Registered Nurse Herbert Pun, MD as Consulting Physician (General Surgery)  CHIEF COMPLAINT: Stage IIIC ovarian cancer.  INTERVAL HISTORY: Patient returns to clinic today for repeat laboratory, further evaluation, and consideration of reinitiation of treatment.  She currently feels well and is asymptomatic. She has no neurologic complaints.  She has a good appetite and her weight has remained stable.  She denies any chest pain, shortness of breath, cough, or hemoptysis.  She denies any abdominal pain or bloating.  She denies any nausea, vomiting, constipation.  She continues to have occasional diarrhea residual from her surgery many years ago.  She has no urinary complaints.  Patient offers no further specific complaints today.  REVIEW OF SYSTEMS:   Review of Systems  Constitutional: Negative.  Negative for fever, malaise/fatigue and weight loss.  Respiratory: Negative.  Negative for cough and shortness of breath.   Cardiovascular: Negative.  Negative for chest pain and leg swelling.  Gastrointestinal: Positive for diarrhea. Negative for abdominal pain, blood in stool, constipation, heartburn, melena, nausea and vomiting.  Genitourinary: Negative.  Negative for dysuria.  Musculoskeletal: Negative.  Negative for back pain and neck pain.  Skin: Negative.  Negative for itching and rash.  Neurological: Negative.  Negative for dizziness, focal weakness and weakness.  Endo/Heme/Allergies: Does not bruise/bleed easily.  Psychiatric/Behavioral: Positive for memory loss. The patient is not nervous/anxious.     As per HPI. Otherwise, a complete review of systems is negative.  PAST MEDICAL HISTORY: Past Medical History:  Diagnosis Date  .  Anemia   . Arthritis   . Asthma   . Dyspnea   . GERD (gastroesophageal reflux disease)   . Hypertension   . Ovarian cancer ([place]) 2018    PAST SURGICAL HISTORY: Past Surgical History:  Procedure Laterality Date  . ABDOMINAL HYSTERECTOMY    . BREAST BIOPSY     x6  . BUNIONECTOMY Bilateral   . CATARACT EXTRACTION, BILATERAL    . FLEXIBLE SIGMOIDOSCOPY N/A 05/21/2017   Procedure: FLEXIBLE SIGMOIDOSCOPY;  Surgeon: Lin Landsman, MD;  Location: [facility];  Service: Gastroenterology;  Laterality: N/A;  . INCONTINENCE SURGERY    . PORTA CATH INSERTION N/A 06/16/2017   Procedure: PORTA CATH INSERTION;  Surgeon: Algernon Huxley, MD;  Location: [place] [facility];  Service: Cardiovascular;  Laterality: N/A;  . RECTAL SURGERY  04/2018  . REPAIR OF RECTAL PROLAPSE N/A 05/11/2017   Procedure: REPAIR OF RECTAL PROLAPSE;  Surgeon: Leonie Green, MD;  Location: [facility];  Service: General;  Laterality: N/A;  . TONSILLECTOMY    . VAGINA SURGERY     uncertain procedure performed    FAMILY HISTORY: Family History  Problem Relation Age of Onset  . Heart disease Mother   . Heart disease Father   . Heart disease Sister   . Heart attack Sister   . Ulcerative colitis Brother   . Lung cancer Brother   . Thyroid cancer Sister   . Asthma Sister   . Diabetes Sister   . Asthma Sister   . Pancreatic cancer Sister   . Dementia Sister   . Asthma Brother   . Heart disease Brother   . Asthma Brother   . Lung cancer Brother   . Asthma Brother   . Lung cancer Brother   .  Lung cancer Brother   . Rheum arthritis Brother     ADVANCED DIRECTIVES (Y/N):  N  HEALTH MAINTENANCE: Social History   Tobacco Use  . Smoking status: Never Smoker  . Smokeless tobacco: Never Used  Substance Use Topics  . Alcohol use: No  . Drug use: No     Colonoscopy:  PAP:  Bone density:  Lipid panel:  No Known Allergies  Current Outpatient Medications  Medication Sig Dispense Refill  .  acetaminophen (TYLENOL) 500 MG tablet Take 500 mg by mouth every 6 (six) hours as needed.    . cyclobenzaprine (FLEXERIL) 10 MG tablet Take 1 tablet (10 mg total) by mouth 3 (three) times daily as needed for muscle spasms. 30 tablet 1  . esomeprazole (NEXIUM) 40 MG capsule Take 40 mg by mouth daily before breakfast.     . lidocaine-prilocaine (EMLA) cream APPLY A SMALL AMOUNT TO PORT SITE AT LEAST 1 HOUR PRIOR TO IT BEING ACCESSED THEN COVER WITH PLASTIC WRAP 30 g 0  . montelukast (SINGULAIR) 10 MG tablet Take 10 mg by mouth at bedtime.    . ondansetron (ZOFRAN) 8 MG tablet Take 1 tablet (8 mg total) by mouth 2 (two) times daily as needed for nausea or vomiting. 30 tablet 2  . polyethylene glycol (MIRALAX / GLYCOLAX) packet Take by mouth.    . valsartan (DIOVAN) 160 MG tablet Start 1/2 tab a day.  Can increase to 1 tab after a few weeks if BP stays high     No current facility-administered medications for this visit.   Facility-Administered Medications Ordered in Other Visits  Medication Dose Route Frequency Provider Last Rate Last Admin  . sodium chloride flush (NS) 0.9 % injection 10 mL  10 mL Intravenous PRN Lloyd Huger, MD   10 mL at 03/10/18 1200    OBJECTIVE: Vitals:   08/24/19 1058  BP: (!) 136/96  Pulse: 90  Resp: 20  Temp: 97.9 F (36.6 C)  SpO2: 100%     Body mass index is 21.86 kg/m.    ECOG FS:0 - Asymptomatic  General: Thin, no acute distress. Eyes: Pink conjunctiva, anicteric sclera. HEENT: Normocephalic, moist mucous membranes. Lungs: No audible wheezing or coughing. Heart: Regular rate and rhythm. Abdomen: Soft, nontender, no obvious distention. Musculoskeletal: No edema, cyanosis, or clubbing. Neuro: Alert, answering all questions appropriately. Cranial nerves grossly intact. Skin: No rashes or petechiae noted. Psych: Normal affect.  LAB RESULTS:  Lab Results  Component Value Date   NA 130 (L) 08/24/2019   K 4.5 08/24/2019   CL 97 (L) 08/24/2019     CO2 25 08/24/2019   GLUCOSE 94 08/24/2019   BUN 29 (H) 08/24/2019   CREATININE 0.97 08/24/2019   CALCIUM 8.8 (L) 08/24/2019   PROT 6.8 08/24/2019   ALBUMIN 3.9 08/24/2019   AST 26 08/24/2019   ALT 14 08/24/2019   ALKPHOS 86 08/24/2019   BILITOT 0.4 08/24/2019   GFRNONAA 52 (L) 08/24/2019   GFRAA >60 08/24/2019    Lab Results  Component Value Date   WBC 5.7 08/24/2019   NEUTROABS 4.0 08/24/2019   HGB 10.1 (L) 08/24/2019   HCT 32.2 (L) 08/24/2019   MCV 99.7 08/24/2019   PLT 232 08/24/2019     STUDIES: No results found.  ASSESSMENT: Stage IIIC ovarian cancer.  PLAN:    1. Stage IIIC ovarian cancer: CT scan results from July 03, 2019 reviewed independently with a mixed response to treatment, but overall mild improvement.  CA-125 seems  to have plateaued ranging between 63 and 74.  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 Plan to give treatment on days 1, 8, and 15 with a 22 off.  No further treatment is necessary at this time, but patient agreed that we will likely need to reinitiate treatment in the near future.  Return to clinic in 6 weeks for laboratory work only and then in 3 months for laboratory work, repeat imaging, and consideration of treatment which would be cycle 4, day 1. 2. Lupus anticoagulant: Patient was noted to have an elevated PTT as well as increased bleeding during her surgery for rectal prolapse.   3. Anemia: Chronic and unchanged.  Patient's hemoglobin has improved to 10.1. Previously, iron stores are within normal limits.  4. Diarrhea: Chronic and unchanged. 5.  Neutropenia/thrombocytopenia: Resolved.  Patient expressed understanding and was in agreement with this plan. She also understands that She can call clinic at any time with any questions, concerns, or complaints.   Cancer Staging Malignant neoplasm of ovary [facility]) Staging form: Ovary, Fallopian Tube, and Primary Peritoneal Carcinoma, AJCC 8th  Edition - Clinical stage from 05/29/2017: Stage IIIC (cT3c, cN1b, cM0) - Signed by Lloyd Huger, MD on 05/29/2017   Lloyd Huger, MD   08/24/2019 3:03 PM

## 2019-08-23 ENCOUNTER — Encounter: Payer: Self-pay | Admitting: Oncology

## 2019-08-24 ENCOUNTER — Inpatient Hospital Stay: Payer: Medicare PPO | Attending: Oncology | Admitting: *Deleted

## 2019-08-24 ENCOUNTER — Telehealth: Payer: Self-pay | Admitting: *Deleted

## 2019-08-24 ENCOUNTER — Other Ambulatory Visit: Payer: Self-pay

## 2019-08-24 ENCOUNTER — Inpatient Hospital Stay (HOSPITAL_BASED_OUTPATIENT_CLINIC_OR_DEPARTMENT_OTHER): Payer: Medicare PPO | Admitting: Oncology

## 2019-08-24 ENCOUNTER — Inpatient Hospital Stay: Payer: Medicare PPO

## 2019-08-24 VITALS — BP 136/96 | HR 90 | Temp 97.9°F | Resp 20 | Wt 102.8 lb

## 2019-08-24 DIAGNOSIS — R791 Abnormal coagulation profile: Secondary | ICD-10-CM | POA: Diagnosis not present

## 2019-08-24 DIAGNOSIS — Z95828 Presence of other vascular implants and grafts: Secondary | ICD-10-CM

## 2019-08-24 DIAGNOSIS — K219 Gastro-esophageal reflux disease without esophagitis: Secondary | ICD-10-CM | POA: Diagnosis not present

## 2019-08-24 DIAGNOSIS — R197 Diarrhea, unspecified: Secondary | ICD-10-CM | POA: Diagnosis not present

## 2019-08-24 DIAGNOSIS — I10 Essential (primary) hypertension: Secondary | ICD-10-CM | POA: Insufficient documentation

## 2019-08-24 DIAGNOSIS — D649 Anemia, unspecified: Secondary | ICD-10-CM | POA: Diagnosis not present

## 2019-08-24 DIAGNOSIS — Z79899 Other long term (current) drug therapy: Secondary | ICD-10-CM | POA: Diagnosis not present

## 2019-08-24 DIAGNOSIS — M1990 Unspecified osteoarthritis, unspecified site: Secondary | ICD-10-CM | POA: Insufficient documentation

## 2019-08-24 DIAGNOSIS — C569 Malignant neoplasm of unspecified ovary: Secondary | ICD-10-CM

## 2019-08-24 LAB — COMPREHENSIVE METABOLIC PANEL
ALT: 14 U/L (ref 0–44)
AST: 26 U/L (ref 15–41)
Albumin: 3.9 g/dL (ref 3.5–5.0)
Alkaline Phosphatase: 86 U/L (ref 38–126)
Anion gap: 8 (ref 5–15)
BUN: 29 mg/dL — ABNORMAL HIGH (ref 8–23)
CO2: 25 mmol/L (ref 22–32)
Calcium: 8.8 mg/dL — ABNORMAL LOW (ref 8.9–10.3)
Chloride: 97 mmol/L — ABNORMAL LOW (ref 98–111)
Creatinine, Ser: 0.97 mg/dL (ref 0.44–1.00)
GFR calc Af Amer: >60 mL/min (ref >60)
GFR calc non Af Amer: 52 mL/min — ABNORMAL LOW (ref >60)
Glucose, Bld: 94 mg/dL (ref 70–99)
Potassium: 4.5 mmol/L (ref 3.5–5.1)
Sodium: 130 mmol/L — ABNORMAL LOW (ref 135–145)
Total Bilirubin: 0.4 mg/dL (ref 0.3–1.2)
Total Protein: 6.8 g/dL (ref 6.5–8.1)

## 2019-08-24 LAB — CBC WITH DIFFERENTIAL/PLATELET
Abs Immature Granulocytes: 0.01 10*3/uL (ref 0.00–0.07)
Basophils Absolute: 0 10*3/uL (ref 0.0–0.1)
Basophils Relative: 1 %
Eosinophils Absolute: 0.1 10*3/uL (ref 0.0–0.5)
Eosinophils Relative: 1 %
HCT: 32.2 % — ABNORMAL LOW (ref 36.0–46.0)
Hemoglobin: 10.1 g/dL — ABNORMAL LOW (ref 12.0–15.0)
Immature Granulocytes: 0 %
Lymphocytes Relative: 18 %
Lymphs Abs: 1.1 10*3/uL (ref 0.7–4.0)
MCH: 31.3 pg (ref 26.0–34.0)
MCHC: 31.4 g/dL (ref 30.0–36.0)
MCV: 99.7 fL (ref 80.0–100.0)
Monocytes Absolute: 0.6 10*3/uL (ref 0.1–1.0)
Monocytes Relative: 10 %
Neutro Abs: 4 10*3/uL (ref 1.7–7.7)
Neutrophils Relative %: 70 %
Platelets: 232 10*3/uL (ref 150–400)
RBC: 3.23 MIL/uL — ABNORMAL LOW (ref 3.87–5.11)
RDW: 13.6 % (ref 11.5–15.5)
WBC: 5.7 10*3/uL (ref 4.0–10.5)
nRBC: 0 % (ref 0.0–0.2)

## 2019-08-24 MED ORDER — HEPARIN SOD (PORK) LOCK FLUSH 100 UNIT/ML IV SOLN
500.0000 [IU] | Freq: Once | INTRAVENOUS | Status: AC
  Administered 2019-08-24: 500 [IU] via INTRAVENOUS
  Filled 2019-08-24: qty 5

## 2019-08-24 MED ORDER — SODIUM CHLORIDE 0.9% FLUSH
10.0000 mL | Freq: Once | INTRAVENOUS | Status: AC
  Administered 2019-08-24: 10 mL via INTRAVENOUS
  Filled 2019-08-24: qty 10

## 2019-08-24 NOTE — Telephone Encounter (Signed)
Entered in error

## 2019-08-31 ENCOUNTER — Ambulatory Visit: Payer: Medicare Other | Admitting: Oncology

## 2019-08-31 ENCOUNTER — Ambulatory Visit: Payer: Medicare Other

## 2019-08-31 ENCOUNTER — Other Ambulatory Visit: Payer: Medicare Other

## 2019-10-05 ENCOUNTER — Inpatient Hospital Stay: Payer: Medicare PPO

## 2019-10-12 ENCOUNTER — Other Ambulatory Visit: Payer: Self-pay

## 2019-10-12 ENCOUNTER — Inpatient Hospital Stay: Payer: Medicare PPO | Attending: Oncology

## 2019-10-12 DIAGNOSIS — C569 Malignant neoplasm of unspecified ovary: Secondary | ICD-10-CM | POA: Insufficient documentation

## 2019-10-12 DIAGNOSIS — Z452 Encounter for adjustment and management of vascular access device: Secondary | ICD-10-CM | POA: Insufficient documentation

## 2019-10-12 LAB — CBC WITH DIFFERENTIAL/PLATELET
Abs Immature Granulocytes: 0.02 10*3/uL (ref 0.00–0.07)
Basophils Absolute: 0 10*3/uL (ref 0.0–0.1)
Basophils Relative: 1 %
Eosinophils Absolute: 0.1 10*3/uL (ref 0.0–0.5)
Eosinophils Relative: 1 %
HCT: 32.4 % — ABNORMAL LOW (ref 36.0–46.0)
Hemoglobin: 10.3 g/dL — ABNORMAL LOW (ref 12.0–15.0)
Immature Granulocytes: 0 %
Lymphocytes Relative: 17 %
Lymphs Abs: 1.1 10*3/uL (ref 0.7–4.0)
MCH: 30.3 pg (ref 26.0–34.0)
MCHC: 31.8 g/dL (ref 30.0–36.0)
MCV: 95.3 fL (ref 80.0–100.0)
Monocytes Absolute: 0.5 10*3/uL (ref 0.1–1.0)
Monocytes Relative: 7 %
Neutro Abs: 4.9 10*3/uL (ref 1.7–7.7)
Neutrophils Relative %: 74 %
Platelets: 249 10*3/uL (ref 150–400)
RBC: 3.4 MIL/uL — ABNORMAL LOW (ref 3.87–5.11)
RDW: 13.4 % (ref 11.5–15.5)
WBC: 6.6 10*3/uL (ref 4.0–10.5)
nRBC: 0 % (ref 0.0–0.2)

## 2019-10-12 LAB — COMPREHENSIVE METABOLIC PANEL
ALT: 13 U/L (ref 0–44)
AST: 31 U/L (ref 15–41)
Albumin: 3.9 g/dL (ref 3.5–5.0)
Alkaline Phosphatase: 82 U/L (ref 38–126)
Anion gap: 6 (ref 5–15)
BUN: 35 mg/dL — ABNORMAL HIGH (ref 8–23)
CO2: 26 mmol/L (ref 22–32)
Calcium: 8.5 mg/dL — ABNORMAL LOW (ref 8.9–10.3)
Chloride: 99 mmol/L (ref 98–111)
Creatinine, Ser: 0.98 mg/dL (ref 0.44–1.00)
GFR calc Af Amer: 59 mL/min — ABNORMAL LOW (ref >60)
GFR calc non Af Amer: 51 mL/min — ABNORMAL LOW (ref >60)
Glucose, Bld: 177 mg/dL — ABNORMAL HIGH (ref 70–99)
Potassium: 4.7 mmol/L (ref 3.5–5.1)
Sodium: 131 mmol/L — ABNORMAL LOW (ref 135–145)
Total Bilirubin: 0.5 mg/dL (ref 0.3–1.2)
Total Protein: 6.9 g/dL (ref 6.5–8.1)

## 2019-10-12 MED ORDER — HEPARIN SOD (PORK) LOCK FLUSH 100 UNIT/ML IV SOLN
500.0000 [IU] | Freq: Once | INTRAVENOUS | Status: AC
  Administered 2019-10-12: 500 [IU] via INTRAVENOUS
  Filled 2019-10-12: qty 5

## 2019-10-12 MED ORDER — SODIUM CHLORIDE 0.9% FLUSH
10.0000 mL | Freq: Once | INTRAVENOUS | Status: AC
  Administered 2019-10-12: 10 mL via INTRAVENOUS
  Filled 2019-10-12: qty 10

## 2019-10-12 MED ORDER — HEPARIN SOD (PORK) LOCK FLUSH 100 UNIT/ML IV SOLN
INTRAVENOUS | Status: AC
  Filled 2019-10-12: qty 5

## 2019-11-14 ENCOUNTER — Ambulatory Visit
Admission: RE | Admit: 2019-11-14 | Discharge: 2019-11-14 | Disposition: A | Discharge status: Home / Self Care | Payer: Medicare PPO | Source: Ambulatory Visit | Attending: Oncology | Admitting: Oncology

## 2019-11-14 ENCOUNTER — Other Ambulatory Visit: Payer: Self-pay

## 2019-11-14 ENCOUNTER — Inpatient Hospital Stay: Payer: Medicare PPO | Attending: Oncology

## 2019-11-14 DIAGNOSIS — Z9221 Personal history of antineoplastic chemotherapy: Secondary | ICD-10-CM | POA: Diagnosis not present

## 2019-11-14 DIAGNOSIS — Z90710 Acquired absence of both cervix and uterus: Secondary | ICD-10-CM | POA: Diagnosis not present

## 2019-11-14 DIAGNOSIS — C569 Malignant neoplasm of unspecified ovary: Secondary | ICD-10-CM | POA: Insufficient documentation

## 2019-11-14 DIAGNOSIS — D569 Thalassemia, unspecified: Secondary | ICD-10-CM | POA: Insufficient documentation

## 2019-11-14 DIAGNOSIS — N8189 Other female genital prolapse: Secondary | ICD-10-CM | POA: Diagnosis not present

## 2019-11-14 DIAGNOSIS — K449 Diaphragmatic hernia without obstruction or gangrene: Secondary | ICD-10-CM | POA: Diagnosis not present

## 2019-11-14 DIAGNOSIS — Z90722 Acquired absence of ovaries, bilateral: Secondary | ICD-10-CM | POA: Insufficient documentation

## 2019-11-14 DIAGNOSIS — I700 Atherosclerosis of aorta: Secondary | ICD-10-CM | POA: Insufficient documentation

## 2019-11-14 LAB — CBC WITH DIFFERENTIAL/PLATELET
Abs Immature Granulocytes: 0.02 10*3/uL (ref 0.00–0.07)
Basophils Absolute: 0 10*3/uL (ref 0.0–0.1)
Basophils Relative: 0 %
Eosinophils Absolute: 0.1 10*3/uL (ref 0.0–0.5)
Eosinophils Relative: 1 %
HCT: 34.6 % — ABNORMAL LOW (ref 36.0–46.0)
Hemoglobin: 11.2 g/dL — ABNORMAL LOW (ref 12.0–15.0)
Immature Granulocytes: 0 %
Lymphocytes Relative: 16 %
Lymphs Abs: 1.1 10*3/uL (ref 0.7–4.0)
MCH: 29.6 pg (ref 26.0–34.0)
MCHC: 32.4 g/dL (ref 30.0–36.0)
MCV: 91.3 fL (ref 80.0–100.0)
Monocytes Absolute: 0.7 10*3/uL (ref 0.1–1.0)
Monocytes Relative: 9 %
Neutro Abs: 5.3 10*3/uL (ref 1.7–7.7)
Neutrophils Relative %: 74 %
Platelets: 291 10*3/uL (ref 150–400)
RBC: 3.79 MIL/uL — ABNORMAL LOW (ref 3.87–5.11)
RDW: 14.1 % (ref 11.5–15.5)
WBC: 7.2 10*3/uL (ref 4.0–10.5)
nRBC: 0 % (ref 0.0–0.2)

## 2019-11-14 LAB — COMPREHENSIVE METABOLIC PANEL
ALT: 15 U/L (ref 0–44)
AST: 31 U/L (ref 15–41)
Albumin: 4.2 g/dL (ref 3.5–5.0)
Alkaline Phosphatase: 90 U/L (ref 38–126)
Anion gap: 9 (ref 5–15)
BUN: 30 mg/dL — ABNORMAL HIGH (ref 8–23)
CO2: 25 mmol/L (ref 22–32)
Calcium: 8.8 mg/dL — ABNORMAL LOW (ref 8.9–10.3)
Chloride: 94 mmol/L — ABNORMAL LOW (ref 98–111)
Creatinine, Ser: 0.98 mg/dL (ref 0.44–1.00)
GFR calc Af Amer: 59 mL/min — ABNORMAL LOW (ref >60)
GFR calc non Af Amer: 51 mL/min — ABNORMAL LOW (ref >60)
Glucose, Bld: 96 mg/dL (ref 70–99)
Potassium: 4.7 mmol/L (ref 3.5–5.1)
Sodium: 128 mmol/L — ABNORMAL LOW (ref 135–145)
Total Bilirubin: 0.4 mg/dL (ref 0.3–1.2)
Total Protein: 7.4 g/dL (ref 6.5–8.1)

## 2019-11-14 MED ORDER — IOHEXOL 300 MG/ML  SOLN
75.0000 mL | Freq: Once | INTRAMUSCULAR | Status: AC | PRN
  Administered 2019-11-14: 75 mL via INTRAVENOUS

## 2019-11-15 LAB — CA 125: Cancer Antigen (CA) 125: 326 U/mL — ABNORMAL HIGH (ref 0.0–38.1)

## 2019-11-16 NOTE — Progress Notes (Signed)
[place]  [phone]) [phone] Fax:(336) [phone]  ID: Tamara Mckinney OB: [birth year]  MR#: [id number]  [id number]  Patient Care Team: [name], MD as PCP - General (Family Medicine) [name], RN as Registered Nurse Herbert Pun, MD as Consulting Physician (General Surgery) Lloyd Huger, MD as Consulting Physician (Oncology)  CHIEF COMPLAINT: Stage IIIC ovarian cancer.  INTERVAL HISTORY: Patient returns to clinic today for repeat laboratory work, discussion of her imaging results, and treatment planning.  She continues to feel well and remains asymptomatic. She has no neurologic complaints.  She has a good appetite and her weight has remained stable.  She denies any chest pain, shortness of breath, cough, or hemoptysis.  She denies any abdominal pain or bloating.  She denies any nausea, vomiting, constipation.  She continues to have occasional diarrhea residual from her surgery many years ago.  She has no urinary complaints.  Patient feels at her baseline offers no specific complaints today.  REVIEW OF SYSTEMS:   Review of Systems  Constitutional: Negative.  Negative for fever, malaise/fatigue and weight loss.  Respiratory: Negative.  Negative for cough and shortness of breath.   Cardiovascular: Negative.  Negative for chest pain and leg swelling.  Gastrointestinal: Positive for diarrhea. Negative for abdominal pain, blood in stool, constipation, heartburn, melena, nausea and vomiting.  Genitourinary: Negative.  Negative for dysuria.  Musculoskeletal: Negative.  Negative for back pain and neck pain.  Skin: Negative.  Negative for itching and rash.  Neurological: Negative.  Negative for dizziness, focal weakness and weakness.  Endo/Heme/Allergies: Does not bruise/bleed easily.  Psychiatric/Behavioral: Positive for memory loss. The patient is not nervous/anxious.     As per HPI. Otherwise, a complete review of systems is  negative.  PAST MEDICAL HISTORY: Past Medical History:  Diagnosis Date  . Anemia   . Arthritis   . Asthma   . Dyspnea   . GERD (gastroesophageal reflux disease)   . Hypertension   . Ovarian cancer ([place]) 2018    PAST SURGICAL HISTORY: Past Surgical History:  Procedure Laterality Date  . ABDOMINAL HYSTERECTOMY    . BREAST BIOPSY     x6  . BUNIONECTOMY Bilateral   . CATARACT EXTRACTION, BILATERAL    . FLEXIBLE SIGMOIDOSCOPY N/A 05/21/2017   Procedure: FLEXIBLE SIGMOIDOSCOPY;  Surgeon: Lin Landsman, MD;  [facility] ENDOSCOPY;  Service: Gastroenterology;  Laterality: N/A;  . INCONTINENCE SURGERY    . PORTA CATH INSERTION N/A 06/16/2017   Procedure: PORTA CATH INSERTION;  Surgeon: Algernon Huxley, MD;  Location: [place] [facility] LAB;  Service: Cardiovascular;  Laterality: N/A;  . RECTAL SURGERY  04/2018  . REPAIR OF RECTAL PROLAPSE N/A 05/11/2017   Procedure: REPAIR OF RECTAL PROLAPSE;  Surgeon: Leonie Green, MD;  Location: [facility] ORS;  Service: General;  Laterality: N/A;  . TONSILLECTOMY    . VAGINA SURGERY     uncertain procedure performed    FAMILY HISTORY: Family History  Problem Relation Age of Onset  . Heart disease Mother   . Heart disease Father   . Heart disease Sister   . Heart attack Sister   . Ulcerative colitis Brother   . Lung cancer Brother   . Thyroid cancer Sister   . Asthma Sister   . Diabetes Sister   . Asthma Sister   . Pancreatic cancer Sister   . Dementia Sister   . Asthma Brother   . Heart disease Brother   . Asthma Brother   . Lung  cancer Brother   . Asthma Brother   . Lung cancer Brother   . Lung cancer Brother   . Rheum arthritis Brother     ADVANCED DIRECTIVES (Y/N):  N  HEALTH MAINTENANCE: Social History   Tobacco Use  . Smoking status: Never Smoker  . Smokeless tobacco: Never Used  Substance Use Topics  . Alcohol use: No  . Drug use: No     Colonoscopy:  PAP:  Bone density:  Lipid panel:  No Known  Allergies  Current Outpatient Medications  Medication Sig Dispense Refill  . acetaminophen (TYLENOL) 500 MG tablet Take 500 mg by mouth every 6 (six) hours as needed.    . cyclobenzaprine (FLEXERIL) 10 MG tablet Take 1 tablet (10 mg total) by mouth 3 (three) times daily as needed for muscle spasms. 30 tablet 1  . esomeprazole (NEXIUM) 40 MG capsule Take 40 mg by mouth daily before breakfast.     . lidocaine-prilocaine (EMLA) cream APPLY A SMALL AMOUNT TO PORT SITE AT LEAST 1 HOUR PRIOR TO IT BEING ACCESSED THEN COVER WITH PLASTIC WRAP 30 g 0  . montelukast (SINGULAIR) 10 MG tablet Take 10 mg by mouth at bedtime.    . ondansetron (ZOFRAN) 8 MG tablet Take 1 tablet (8 mg total) by mouth 2 (two) times daily as needed for nausea or vomiting. 30 tablet 2  . polyethylene glycol (MIRALAX / GLYCOLAX) packet Take by mouth.    . valsartan (DIOVAN) 160 MG tablet Start 1/2 tab a day.  Can increase to 1 tab after a few weeks if BP stays high     No current facility-administered medications for this visit.   Facility-Administered Medications Ordered in Other Visits  Medication Dose Route Frequency Provider Last Rate Last Admin  . sodium chloride flush (NS) 0.9 % injection 10 mL  10 mL Intravenous PRN Lloyd Huger, MD   10 mL at 03/10/18 1200    OBJECTIVE: There were no vitals filed for this visit.   There is no height or weight on file to calculate BMI.    ECOG FS:0 - Asymptomatic  General: Thin, no acute distress. Eyes: Pink conjunctiva, anicteric sclera. HEENT: Normocephalic, moist mucous membranes. Lungs: No audible wheezing or coughing. Heart: Regular rate and rhythm. Abdomen: Soft, nontender, no obvious distention. Musculoskeletal: No edema, cyanosis, or clubbing. Neuro: Alert, answering all questions appropriately. Cranial nerves grossly intact. Skin: No rashes or petechiae noted. Psych: Normal affect.   LAB RESULTS:  Lab Results  Component Value Date   NA 128 (L) 11/14/2019    K 4.7 11/14/2019   CL 94 (L) 11/14/2019   CO2 25 11/14/2019   GLUCOSE 96 11/14/2019   BUN 30 (H) 11/14/2019   CREATININE 0.98 11/14/2019   CALCIUM 8.8 (L) 11/14/2019   PROT 7.4 11/14/2019   ALBUMIN 4.2 11/14/2019   AST 31 11/14/2019   ALT 15 11/14/2019   ALKPHOS 90 11/14/2019   BILITOT 0.4 11/14/2019   GFRNONAA 51 (L) 11/14/2019   GFRAA 59 (L) 11/14/2019    Lab Results  Component Value Date   WBC 7.2 11/14/2019   NEUTROABS 5.3 11/14/2019   HGB 11.2 (L) 11/14/2019   HCT 34.6 (L) 11/14/2019   MCV 91.3 11/14/2019   PLT 291 11/14/2019     STUDIES: CT Abdomen Pelvis W Contrast  Result Date: 11/14/2019 CLINICAL DATA:  Follow-up of ovarian cancer. Asymptomatic. Loose stools. Partial hysterectomy and vaginal surgery. EXAM: CT ABDOMEN AND PELVIS WITH CONTRAST TECHNIQUE: Multidetector CT imaging of the  abdomen and pelvis was performed using the standard protocol following bolus administration of intravenous contrast. CONTRAST:  91mL OMNIPAQUE IOHEXOL 300 MG/ML  SOLN COMPARISON:  07/03/2019 FINDINGS: Lower chest: Clear lung bases. Normal heart size without pericardial or pleural effusion. Moderate hiatal hernia. Hepatobiliary: Subtle high right hepatic lobe capsular based 1.4 cm low-density lesion on 11/02, felt to be new. A lesion along the posterior right hepatic capsule measures 2.7 cm on 20/2 versus 1.5 cm on the prior exam. Normal gallbladder, without biliary ductal dilatation. Pancreas: Normal, without mass or ductal dilatation. Spleen: Normal in size, without focal abnormality. Adrenals/Urinary Tract: Left adrenal thickening. Normal right adrenal gland. Normal kidneys for age, without hydronephrosis. Mild cystocele. Stomach/Bowel: Normal distal stomach. Descending duodenal diverticulum. Enterocele. Surgical sutures in the rectum. Colonic stool burden suggests constipation. No bowel obstruction. Vascular/Lymphatic: Advanced aortic and branch vessel atherosclerosis. No abdominopelvic  adenopathy. Reproductive: Hysterectomy. Cystic lesion within the left adnexa measures 2.7 cm on 50/2 versus 3.0 cm on the prior exam. Other: Perisplenic lesions again identified. An index lesion anteromedial to the inferior aspect of the spleen measures 6.0 x 4.7 cm on 20/2 versus 4.8 x 4.0 cm on the prior. Just lateral to this, lesion with increased complexity today measures 5.8 x 5.7 cm on 23/2 versus 5.8 x 5.0 cm on the prior. Solid lesion about the anterolateral aspect of the spleen measures 2.5 cm on 17/2 versus 1.5 cm on the prior. Macrolobulated solid and cystic anterior upper left pelvic mass measures 8.1 x 7.7 cm on 36/2 versus 5.3 x 4.3 cm on the prior exam. An index increasingly complex lesion adjacent the medial aspect of the left iliacus muscle measures 3.2 cm on 35/2 versus 2.9 cm on the prior. A new lesion about the superior aspect of the right side of the vaginal cuff measures 1.8 cm on 59/2. Musculoskeletal: Moderate convex right lumbar spine curvature with lumbosacral spondylosis. IMPRESSION: 1. Mild disease progression, as evidenced by enlargement of omental/peritoneal implants. New lesions within the right pelvis and high right hepatic lobe (capsular or parenchymal) as detailed above. 2. No bowel obstruction or other acute complication. 3. Hiatal hernia. 4.  Aortic Atherosclerosis (ICD10-I70.0). 5. Pelvic floor laxity with cystocele and enterocele. 6.  Possible constipation. Electronically Signed   By: Abigail Miyamoto M.D.   On: 11/14/2019 16:30    ASSESSMENT: Stage IIIC ovarian cancer.  PLAN:    1. Stage IIIC ovarian cancer: CT scan results from November 14, 2019 reviewed independently and report as above with disease progression with enlargement of omental and peritoneal implants.  She also has new lesions within the right pelvis and hepatic lobe.  Her CA-125 has also significantly increased and is now 326.  After lengthy discussion with the patient and her son, she wishes to reinitiate  treatment.  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 Plan to give treatment on days 1, 8, and 15 with a 22 off.  Return to clinic on Dec 21, 2019 to initiate cycle 4, day 1 of treatment. 2. Lupus anticoagulant: Patient was noted to have an elevated PTT as well as increased bleeding during her surgery for rectal prolapse.   3. Anemia: Chronic and unchanged.  Patient's hemoglobin has improved to 11.2.  Previously, iron stores are within normal limits.  4. Diarrhea: Chronic and unchanged. 5.  Neutropenia/thrombocytopenia: Resolved.  Patient expressed understanding and was in agreement with this plan. She also understands that She can call clinic at any time with any questions,  concerns, or complaints.   Cancer Staging Malignant neoplasm of ovary [facility]) Staging form: Ovary, Fallopian Tube, and Primary Peritoneal Carcinoma, AJCC 8th Edition - Clinical stage from 05/29/2017: Stage IIIC (cT3c, cN1b, cM0) - Signed by Lloyd Huger, MD on 05/29/2017   Lloyd Huger, MD   11/23/2019 6:30 PM

## 2019-11-23 ENCOUNTER — Inpatient Hospital Stay: Payer: Medicare PPO | Attending: Oncology | Admitting: Oncology

## 2019-11-23 ENCOUNTER — Other Ambulatory Visit: Payer: Self-pay

## 2019-11-23 DIAGNOSIS — Z79899 Other long term (current) drug therapy: Secondary | ICD-10-CM | POA: Insufficient documentation

## 2019-11-23 DIAGNOSIS — R791 Abnormal coagulation profile: Secondary | ICD-10-CM | POA: Insufficient documentation

## 2019-11-23 DIAGNOSIS — C569 Malignant neoplasm of unspecified ovary: Secondary | ICD-10-CM | POA: Insufficient documentation

## 2019-11-23 DIAGNOSIS — R197 Diarrhea, unspecified: Secondary | ICD-10-CM | POA: Insufficient documentation

## 2019-11-23 DIAGNOSIS — Z801 Family history of malignant neoplasm of trachea, bronchus and lung: Secondary | ICD-10-CM | POA: Insufficient documentation

## 2019-11-23 DIAGNOSIS — K219 Gastro-esophageal reflux disease without esophagitis: Secondary | ICD-10-CM | POA: Diagnosis not present

## 2019-11-23 DIAGNOSIS — D649 Anemia, unspecified: Secondary | ICD-10-CM | POA: Insufficient documentation

## 2019-11-23 DIAGNOSIS — I10 Essential (primary) hypertension: Secondary | ICD-10-CM | POA: Insufficient documentation

## 2019-11-23 DIAGNOSIS — M1990 Unspecified osteoarthritis, unspecified site: Secondary | ICD-10-CM | POA: Diagnosis not present

## 2019-11-28 ENCOUNTER — Telehealth: Payer: Self-pay | Admitting: *Deleted

## 2019-11-28 NOTE — Telephone Encounter (Signed)
Call returned to son and advised per Dr Grayland Ormond that the shingles vaccine will be fine

## 2019-11-28 NOTE — Telephone Encounter (Signed)
Son Abbe [place] called asking if patient needs to have Shingles vaccine before her next chemotherapy. Please advise

## 2019-11-28 NOTE — Telephone Encounter (Signed)
Yes that will be fine. 

## 2019-12-14 NOTE — Progress Notes (Signed)

## 2019-12-16 NOTE — Progress Notes (Signed)
[place]  Telephone:(336) [phone] Fax:(336) [phone]  ID: Tamara Mckinney OB: 1928-05-30  MR#: [id number]  [id number]  Patient Care Team: [name], MD as PCP - General (Family Medicine) [name], RN as Registered Nurse Herbert Pun, MD as Consulting Physician (General Surgery) Lloyd Huger, MD as Consulting Physician (Oncology)  CHIEF COMPLAINT: Stage IIIC ovarian cancer.  INTERVAL HISTORY: Patient returns to clinic today for further evaluation and consideration of cycle 4, day 1 of gemcitabine.  She continues to feel well and remains asymptomatic. She has no neurologic complaints.  She has a good appetite and her weight has remained stable.  She denies any chest pain, shortness of breath, cough, or hemoptysis.  She denies any abdominal pain or bloating.  She denies any nausea, vomiting, constipation.  She continues to have occasional diarrhea residual from her surgery many years ago.  She has no urinary complaints.  Patient offers no further specific complaints today.  REVIEW OF SYSTEMS:   Review of Systems  Constitutional: Negative.  Negative for fever, malaise/fatigue and weight loss.  Respiratory: Negative.  Negative for cough and shortness of breath.   Cardiovascular: Negative.  Negative for chest pain and leg swelling.  Gastrointestinal: Positive for diarrhea. Negative for abdominal pain, blood in stool, constipation, heartburn, melena, nausea and vomiting.  Genitourinary: Negative.  Negative for dysuria.  Musculoskeletal: Negative.  Negative for back pain and neck pain.  Skin: Negative.  Negative for itching and rash.  Neurological: Negative.  Negative for dizziness, focal weakness and weakness.  Endo/Heme/Allergies: Does not bruise/bleed easily.  Psychiatric/Behavioral: Positive for memory loss. The patient is not nervous/anxious.     As per HPI. Otherwise, a complete review of systems is negative.  PAST MEDICAL  HISTORY: Past Medical History:  Diagnosis Date  . Anemia   . Arthritis   . Asthma   . Dyspnea   . GERD (gastroesophageal reflux disease)   . Hypertension   . Ovarian cancer ([place]) 2018    PAST SURGICAL HISTORY: Past Surgical History:  Procedure Laterality Date  . ABDOMINAL HYSTERECTOMY    . BREAST BIOPSY     x6  . BUNIONECTOMY Bilateral   . CATARACT EXTRACTION, BILATERAL    . FLEXIBLE SIGMOIDOSCOPY N/A 05/21/2017   Procedure: FLEXIBLE SIGMOIDOSCOPY;  Surgeon: Lin Landsman, MD;  Location: [facility] ENDOSCOPY;  Service: Gastroenterology;  Laterality: N/A;  . INCONTINENCE SURGERY    . PORTA CATH INSERTION N/A 06/16/2017   Procedure: PORTA CATH INSERTION;  Surgeon: Algernon Huxley, MD;  Location: [place] CV LAB;  Service: Cardiovascular;  Laterality: N/A;  . RECTAL SURGERY  04/2018  . REPAIR OF RECTAL PROLAPSE N/A 05/11/2017   Procedure: REPAIR OF RECTAL PROLAPSE;  Surgeon: Leonie Green, MD;  Location: [facility] ORS;  Service: General;  Laterality: N/A;  . TONSILLECTOMY    . VAGINA SURGERY     uncertain procedure performed    FAMILY HISTORY: Family History  Problem Relation Age of Onset  . Heart disease Mother   . Heart disease Father   . Heart disease Sister   . Heart attack Sister   . Ulcerative colitis Brother   . Lung cancer Brother   . Thyroid cancer Sister   . Asthma Sister   . Diabetes Sister   . Asthma Sister   . Pancreatic cancer Sister   . Dementia Sister   . Asthma Brother   . Heart disease Brother   . Asthma Brother   . Lung cancer Brother   .  Asthma Brother   . Lung cancer Brother   . Lung cancer Brother   . Rheum arthritis Brother     ADVANCED DIRECTIVES (Y/N):  N  HEALTH MAINTENANCE: Social History   Tobacco Use  . Smoking status: Never Smoker  . Smokeless tobacco: Never Used  Substance Use Topics  . Alcohol use: No  . Drug use: No     Colonoscopy:  PAP:  Bone density:  Lipid panel:  No Known Allergies  Current Outpatient  Medications  Medication Sig Dispense Refill  . acetaminophen (TYLENOL) 500 MG tablet Take 500 mg by mouth every 6 (six) hours as needed.    . cyclobenzaprine (FLEXERIL) 10 MG tablet Take 1 tablet (10 mg total) by mouth 3 (three) times daily as needed for muscle spasms. 30 tablet 1  . esomeprazole (NEXIUM) 40 MG capsule Take 40 mg by mouth daily before breakfast.     . lidocaine-prilocaine (EMLA) cream APPLY A SMALL AMOUNT TO PORT SITE AT LEAST 1 HOUR PRIOR TO IT BEING ACCESSED THEN COVER WITH PLASTIC WRAP 30 g 0  . montelukast (SINGULAIR) 10 MG tablet Take 10 mg by mouth at bedtime.    . ondansetron (ZOFRAN) 8 MG tablet Take 1 tablet (8 mg total) by mouth 2 (two) times daily as needed for nausea or vomiting. 30 tablet 2  . polyethylene glycol (MIRALAX / GLYCOLAX) packet Take 17 g by mouth daily as needed.     . valsartan (DIOVAN) 160 MG tablet Start 1/2 tab a day.  Can increase to 1 tab after a few weeks if BP stays high     No current facility-administered medications for this visit.   Facility-Administered Medications Ordered in Other Visits  Medication Dose Route Frequency Provider Last Rate Last Admin  . sodium chloride flush (NS) 0.9 % injection 10 mL  10 mL Intravenous PRN Lloyd Huger, MD   10 mL at 03/10/18 1200    OBJECTIVE: Vitals:   12/21/19 1029  BP: 139/83  Pulse: 86  Resp: 18  Temp: (!) 96.9 F (36.1 C)  SpO2: 100%     Body mass index is 21.18 kg/m.    ECOG FS:0 - Asymptomatic  General: Thin, no acute distress. Eyes: Pink conjunctiva, anicteric sclera. HEENT: Normocephalic, moist mucous membranes. Lungs: No audible wheezing or coughing. Heart: Regular rate and rhythm. Abdomen: Soft, nontender, no obvious distention. Musculoskeletal: No edema, cyanosis, or clubbing. Neuro: Alert, answering all questions appropriately. Cranial nerves grossly intact. Skin: No rashes or petechiae noted. Psych: Normal affect.   LAB RESULTS:  Lab Results  Component Value  Date   NA 131 (L) 12/21/2019   K 4.3 12/21/2019   CL 97 (L) 12/21/2019   CO2 25 12/21/2019   GLUCOSE 103 (H) 12/21/2019   BUN 23 12/21/2019   CREATININE 0.85 12/21/2019   CALCIUM 8.5 (L) 12/21/2019   PROT 6.4 (L) 12/21/2019   ALBUMIN 3.6 12/21/2019   AST 26 12/21/2019   ALT 14 12/21/2019   ALKPHOS 79 12/21/2019   BILITOT 0.7 12/21/2019   GFRNONAA >60 12/21/2019   GFRAA >60 12/21/2019    Lab Results  Component Value Date   WBC 8.0 12/21/2019   NEUTROABS 6.4 12/21/2019   HGB 10.5 (L) 12/21/2019   HCT 31.5 (L) 12/21/2019   MCV 90.8 12/21/2019   PLT 302 12/21/2019     STUDIES: No results found.  ASSESSMENT: Stage IIIC ovarian cancer.  PLAN:    1. Stage IIIC ovarian cancer: CT scan results from November 14, 2019 reviewed independently with disease progression with enlargement of omental and peritoneal implants.  She also has new lesions within the right pelvis and hepatic lobe.  Her CA-125 has also significantly increased and is now 326 0.0, today's result is pending. 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 Plan to give treatment on days 1, 8, and 15 with a 22 off.  Although given her history of thrombocytopenia, patient may only be able to receive treatment on days 1 and 8.  Proceed with cycle 4, day 1 today.  Return to clinic in 1 week for further evaluation and consideration of cycle 4, day 8. 2. Lupus anticoagulant: Patient was noted to have an elevated PTT as well as increased bleeding during her surgery for rectal prolapse.   3. Anemia: Chronic and unchanged.  Patient's hemoglobin is essentially stable at 10.5.  Previously, iron stores are within normal limits.  4. Diarrhea: Chronic and unchanged. 5.  Neutropenia/thrombocytopenia: Resolved.  I spent a total of 30 minutes reviewing chart data, face-to-face evaluation with the patient, counseling and coordination of care as detailed above.   Patient expressed understanding and was  in agreement with this plan. She also understands that She can call clinic at any time with any questions, concerns, or complaints.   Cancer Staging Malignant neoplasm of ovary [facility]) Staging form: Ovary, Fallopian Tube, and Primary Peritoneal Carcinoma, AJCC 8th Edition - Clinical stage from 05/29/2017: Stage IIIC (cT3c, cN1b, cM0) - Signed by Lloyd Huger, MD on 05/29/2017   Lloyd Huger, MD   12/21/2019 3:20 PM

## 2019-12-20 ENCOUNTER — Encounter: Payer: Self-pay | Admitting: Oncology

## 2019-12-20 NOTE — Progress Notes (Signed)
Patient was called for pre assessment. She denies any pain or concerns at this time.  

## 2019-12-21 ENCOUNTER — Inpatient Hospital Stay: Payer: Medicare PPO

## 2019-12-21 ENCOUNTER — Other Ambulatory Visit: Payer: Self-pay

## 2019-12-21 ENCOUNTER — Inpatient Hospital Stay (HOSPITAL_BASED_OUTPATIENT_CLINIC_OR_DEPARTMENT_OTHER): Payer: Medicare PPO | Admitting: Oncology

## 2019-12-21 ENCOUNTER — Inpatient Hospital Stay: Payer: Medicare PPO | Attending: Oncology

## 2019-12-21 ENCOUNTER — Encounter: Payer: Self-pay | Admitting: Oncology

## 2019-12-21 VITALS — BP 139/83 | HR 86 | Temp 96.9°F | Resp 18 | Wt 99.6 lb

## 2019-12-21 DIAGNOSIS — R197 Diarrhea, unspecified: Secondary | ICD-10-CM | POA: Diagnosis not present

## 2019-12-21 DIAGNOSIS — Z800 Family history of malignant neoplasm of digestive organs: Secondary | ICD-10-CM | POA: Insufficient documentation

## 2019-12-21 DIAGNOSIS — Z79899 Other long term (current) drug therapy: Secondary | ICD-10-CM | POA: Diagnosis not present

## 2019-12-21 DIAGNOSIS — Z801 Family history of malignant neoplasm of trachea, bronchus and lung: Secondary | ICD-10-CM | POA: Insufficient documentation

## 2019-12-21 DIAGNOSIS — M549 Dorsalgia, unspecified: Secondary | ICD-10-CM | POA: Insufficient documentation

## 2019-12-21 DIAGNOSIS — I10 Essential (primary) hypertension: Secondary | ICD-10-CM | POA: Diagnosis not present

## 2019-12-21 DIAGNOSIS — C569 Malignant neoplasm of unspecified ovary: Secondary | ICD-10-CM

## 2019-12-21 DIAGNOSIS — Z95828 Presence of other vascular implants and grafts: Secondary | ICD-10-CM

## 2019-12-21 DIAGNOSIS — K5900 Constipation, unspecified: Secondary | ICD-10-CM | POA: Diagnosis not present

## 2019-12-21 DIAGNOSIS — Z5111 Encounter for antineoplastic chemotherapy: Secondary | ICD-10-CM | POA: Diagnosis present

## 2019-12-21 DIAGNOSIS — R791 Abnormal coagulation profile: Secondary | ICD-10-CM | POA: Diagnosis not present

## 2019-12-21 DIAGNOSIS — M1990 Unspecified osteoarthritis, unspecified site: Secondary | ICD-10-CM | POA: Insufficient documentation

## 2019-12-21 DIAGNOSIS — D649 Anemia, unspecified: Secondary | ICD-10-CM | POA: Insufficient documentation

## 2019-12-21 DIAGNOSIS — K219 Gastro-esophageal reflux disease without esophagitis: Secondary | ICD-10-CM | POA: Insufficient documentation

## 2019-12-21 DIAGNOSIS — E871 Hypo-osmolality and hyponatremia: Secondary | ICD-10-CM | POA: Insufficient documentation

## 2019-12-21 LAB — CBC WITH DIFFERENTIAL/PLATELET
Abs Immature Granulocytes: 0.03 10*3/uL (ref 0.00–0.07)
Basophils Absolute: 0 10*3/uL (ref 0.0–0.1)
Basophils Relative: 1 %
Eosinophils Absolute: 0.1 10*3/uL (ref 0.0–0.5)
Eosinophils Relative: 1 %
HCT: 31.5 % — ABNORMAL LOW (ref 36.0–46.0)
Hemoglobin: 10.5 g/dL — ABNORMAL LOW (ref 12.0–15.0)
Immature Granulocytes: 0 %
Lymphocytes Relative: 10 %
Lymphs Abs: 0.8 10*3/uL (ref 0.7–4.0)
MCH: 30.3 pg (ref 26.0–34.0)
MCHC: 33.3 g/dL (ref 30.0–36.0)
MCV: 90.8 fL (ref 80.0–100.0)
Monocytes Absolute: 0.7 10*3/uL (ref 0.1–1.0)
Monocytes Relative: 9 %
Neutro Abs: 6.4 10*3/uL (ref 1.7–7.7)
Neutrophils Relative %: 79 %
Platelets: 302 10*3/uL (ref 150–400)
RBC: 3.47 MIL/uL — ABNORMAL LOW (ref 3.87–5.11)
RDW: 14.3 % (ref 11.5–15.5)
WBC: 8 10*3/uL (ref 4.0–10.5)
nRBC: 0 % (ref 0.0–0.2)

## 2019-12-21 LAB — COMPREHENSIVE METABOLIC PANEL
ALT: 14 U/L (ref 0–44)
AST: 26 U/L (ref 15–41)
Albumin: 3.6 g/dL (ref 3.5–5.0)
Alkaline Phosphatase: 79 U/L (ref 38–126)
Anion gap: 9 (ref 5–15)
BUN: 23 mg/dL (ref 8–23)
CO2: 25 mmol/L (ref 22–32)
Calcium: 8.5 mg/dL — ABNORMAL LOW (ref 8.9–10.3)
Chloride: 97 mmol/L — ABNORMAL LOW (ref 98–111)
Creatinine, Ser: 0.85 mg/dL (ref 0.44–1.00)
GFR calc Af Amer: >60 mL/min (ref >60)
GFR calc non Af Amer: >60 mL/min (ref >60)
Glucose, Bld: 103 mg/dL — ABNORMAL HIGH (ref 70–99)
Potassium: 4.3 mmol/L (ref 3.5–5.1)
Sodium: 131 mmol/L — ABNORMAL LOW (ref 135–145)
Total Bilirubin: 0.7 mg/dL (ref 0.3–1.2)
Total Protein: 6.4 g/dL — ABNORMAL LOW (ref 6.5–8.1)

## 2019-12-21 MED ORDER — HEPARIN SOD (PORK) LOCK FLUSH 100 UNIT/ML IV SOLN
500.0000 [IU] | Freq: Once | INTRAVENOUS | Status: AC | PRN
  Administered 2019-12-21: 500 [IU]
  Filled 2019-12-21: qty 5

## 2019-12-21 MED ORDER — SODIUM CHLORIDE 0.9% FLUSH
10.0000 mL | INTRAVENOUS | Status: DC | PRN
  Administered 2019-12-21: 10 mL via INTRAVENOUS
  Filled 2019-12-21: qty 10

## 2019-12-21 MED ORDER — SODIUM CHLORIDE 0.9 % IV SOLN
1000.0000 mg | Freq: Once | INTRAVENOUS | Status: AC
  Administered 2019-12-21: 988.5932 mg via INTRAVENOUS
  Filled 2019-12-21: qty 26

## 2019-12-21 MED ORDER — SODIUM CHLORIDE 0.9 % IV SOLN
Freq: Once | INTRAVENOUS | Status: AC
  Filled 2019-12-21: qty 250

## 2019-12-21 MED ORDER — HEPARIN SOD (PORK) LOCK FLUSH 100 UNIT/ML IV SOLN
INTRAVENOUS | Status: AC
  Filled 2019-12-21: qty 5

## 2019-12-21 MED ORDER — PROCHLORPERAZINE MALEATE 10 MG PO TABS
10.0000 mg | ORAL_TABLET | Freq: Once | ORAL | Status: AC
  Administered 2019-12-21: 10 mg via ORAL
  Filled 2019-12-21: qty 1

## 2019-12-22 LAB — CA 125: Cancer Antigen (CA) 125: 295 U/mL — ABNORMAL HIGH (ref 0.0–38.1)

## 2019-12-22 NOTE — Progress Notes (Signed)
[place]  [phone]) [phone] [phone]) [phone]  ID: [name] OB: [birth year]  MR#: [id number]  [id number]  Patient Care Team: [name], MD as PCP - General (Family Medicine) [name], RN as Registered Nurse Herbert Pun, MD as Consulting Physician (General Surgery) Lloyd Huger, MD as Consulting Physician (Oncology)  CHIEF COMPLAINT: Stage IIIC ovarian cancer.  INTERVAL HISTORY: Patient returns to clinic today for further evaluation and consideration of cycle 4, day 8 of single agent gemcitabine.  She had back pain earlier this week, but this is significantly improved with tramadol.  She otherwise feels well and is tolerating her treatments.  She has no neurologic complaints.  She has a good appetite and her weight has remained stable.  She denies any chest pain, shortness of breath, cough, or hemoptysis.  She denies any abdominal pain or bloating.  She denies any nausea, vomiting, constipation.  She continues to have occasional diarrhea residual from her surgery many years ago.  She has no urinary complaints.  Patient offers no further specific complaints today.  REVIEW OF SYSTEMS:   Review of Systems  Constitutional: Negative.  Negative for fever, malaise/fatigue and weight loss.  Respiratory: Negative.  Negative for cough and shortness of breath.   Cardiovascular: Negative.  Negative for chest pain and leg swelling.  Gastrointestinal: Positive for diarrhea. Negative for abdominal pain, blood in stool, constipation, heartburn, melena, nausea and vomiting.  Genitourinary: Negative.  Negative for dysuria.  Musculoskeletal: Negative.  Negative for back pain and neck pain.  Skin: Negative.  Negative for itching and rash.  Neurological: Negative.  Negative for dizziness, focal weakness and weakness.  Endo/Heme/Allergies: Does not bruise/bleed easily.  Psychiatric/Behavioral: Positive for memory loss. The patient is not  nervous/anxious.     As per HPI. Otherwise, a complete review of systems is negative.  PAST MEDICAL HISTORY: Past Medical History:  Diagnosis Date  . Anemia   . Arthritis   . Asthma   . Dyspnea   . GERD (gastroesophageal reflux disease)   . Hypertension   . Ovarian cancer ([place]) 2018    PAST SURGICAL HISTORY: Past Surgical History:  Procedure Laterality Date  . ABDOMINAL HYSTERECTOMY    . BREAST BIOPSY     x6  . BUNIONECTOMY Bilateral   . CATARACT EXTRACTION, BILATERAL    . FLEXIBLE SIGMOIDOSCOPY N/A 05/21/2017   Procedure: FLEXIBLE SIGMOIDOSCOPY;  Surgeon: Lin Landsman, MD;  Location: [facility] ENDOSCOPY;  Service: Gastroenterology;  Laterality: N/A;  . INCONTINENCE SURGERY    . PORTA CATH INSERTION N/A 06/16/2017   Procedure: PORTA CATH INSERTION;  Surgeon: Algernon Huxley, MD;  Location: [place] CV LAB;  Service: Cardiovascular;  Laterality: N/A;  . RECTAL SURGERY  04/2018  . REPAIR OF RECTAL PROLAPSE N/A 05/11/2017   Procedure: REPAIR OF RECTAL PROLAPSE;  Surgeon: Leonie Green, MD;  Location: [facility] ORS;  Service: General;  Laterality: N/A;  . TONSILLECTOMY    . VAGINA SURGERY     uncertain procedure performed    FAMILY HISTORY: Family History  Problem Relation Age of Onset  . Heart disease Mother   . Heart disease Father   . Heart disease Sister   . Heart attack Sister   . Ulcerative colitis Brother   . Lung cancer Brother   . Thyroid cancer Sister   . Asthma Sister   . Diabetes Sister   . Asthma Sister   . Pancreatic cancer Sister   . Dementia Sister   . Asthma  Brother   . Heart disease Brother   . Asthma Brother   . Lung cancer Brother   . Asthma Brother   . Lung cancer Brother   . Lung cancer Brother   . Rheum arthritis Brother     ADVANCED DIRECTIVES (Y/N):  N  HEALTH MAINTENANCE: Social History   Tobacco Use  . Smoking status: Never Smoker  . Smokeless tobacco: Never Used  Substance Use Topics  . Alcohol use: No  . Drug use: No      Colonoscopy:  PAP:  Bone density:  Lipid panel:  No Known Allergies  Current Outpatient Medications  Medication Sig Dispense Refill  . acetaminophen (TYLENOL) 500 MG tablet Take 500 mg by mouth every 6 (six) hours as needed.    . cyclobenzaprine (FLEXERIL) 10 MG tablet Take 1 tablet (10 mg total) by mouth 3 (three) times daily as needed for muscle spasms. 30 tablet 1  . esomeprazole (NEXIUM) 40 MG capsule Take 40 mg by mouth daily before breakfast.     . lidocaine-prilocaine (EMLA) cream APPLY A SMALL AMOUNT TO PORT SITE AT LEAST 1 HOUR PRIOR TO IT BEING ACCESSED THEN COVER WITH PLASTIC WRAP 30 g 0  . montelukast (SINGULAIR) 10 MG tablet Take 10 mg by mouth at bedtime.    . ondansetron (ZOFRAN) 8 MG tablet Take 1 tablet (8 mg total) by mouth 2 (two) times daily as needed for nausea or vomiting. 30 tablet 2  . polyethylene glycol (MIRALAX / GLYCOLAX) packet Take 17 g by mouth daily as needed.     . traMADol (ULTRAM) 50 MG tablet Take 1 tablet (50 mg total) by mouth every 6 (six) hours as needed. 30 tablet 0  . valsartan (DIOVAN) 160 MG tablet Start 1/2 tab a day.  Can increase to 1 tab after a few weeks if BP stays high     No current facility-administered medications for this visit.   Facility-Administered Medications Ordered in Other Visits  Medication Dose Route Frequency Provider Last Rate Last Admin  . sodium chloride flush (NS) 0.9 % injection 10 mL  10 mL Intravenous PRN Lloyd Huger, MD   10 mL at 03/10/18 1200    OBJECTIVE: Vitals:   12/28/19 1028  BP: (!) 142/67  Pulse: 82  Resp: 20  Temp: (!) 96.9 F (36.1 C)  SpO2: 100%     Body mass index is 21.39 kg/m.    ECOG FS:0 - Asymptomatic  General: Thin, no acute distress. Eyes: Pink conjunctiva, anicteric sclera. HEENT: Normocephalic, moist mucous membranes. Lungs: No audible wheezing or coughing. Heart: Regular rate and rhythm. Abdomen: Soft, nontender, no obvious distention. Musculoskeletal: No edema,  cyanosis, or clubbing. Neuro: Alert, answering all questions appropriately. Cranial nerves grossly intact. Skin: No rashes or petechiae noted. Psych: Normal affect.   LAB RESULTS:  Lab Results  Component Value Date   NA 128 (L) 12/28/2019   K 4.7 12/28/2019   CL 96 (L) 12/28/2019   CO2 24 12/28/2019   GLUCOSE 110 (H) 12/28/2019   BUN 28 (H) 12/28/2019   CREATININE 0.86 12/28/2019   CALCIUM 8.3 (L) 12/28/2019   PROT 6.4 (L) 12/28/2019   ALBUMIN 3.5 12/28/2019   AST 24 12/28/2019   ALT 16 12/28/2019   ALKPHOS 75 12/28/2019   BILITOT 0.5 12/28/2019   GFRNONAA 59 (L) 12/28/2019   GFRAA >60 12/28/2019    Lab Results  Component Value Date   WBC 4.2 12/28/2019   NEUTROABS 3.2 12/28/2019   HGB  9.4 (L) 12/28/2019   HCT 28.5 (L) 12/28/2019   MCV 91.1 12/28/2019   PLT 204 12/28/2019     STUDIES: No results found.  ASSESSMENT: Stage IIIC ovarian cancer.  PLAN:    1. Stage IIIC ovarian cancer: CT scan results from November 14, 2019 reviewed independently with disease progression with enlargement of omental and peritoneal implants.  She also has new lesions within the right pelvis and hepatic lobe.  Her CA-125 was initially elevated to 326, but now has trended down to 295.  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 Plan to give treatment on days 1, 8, and 15 with a 22 off.  Although given her history of thrombocytopenia, patient may only be able to receive treatment on days 1 and 8.  Proceed with cycle 4, day 8 of treatment today.  Return to clinic in 1 week for further evaluation and consideration of cycle 4, day 15. 2. Lupus anticoagulant: Patient was noted to have an elevated PTT as well as increased bleeding during her surgery for rectal prolapse.   3. Anemia: Chronic and unchanged.  Hemoglobin is trended down slightly to 9.4.  Previously, iron stores are within normal limits.  4. Diarrhea: Chronic and unchanged. 5.   Neutropenia/thrombocytopenia: Resolved. 6.  Hyponatremia: Chronic and unchanged.  Patient sodium levels are 128 today. 7.  Back pain: Continue tramadol as needed.   Patient expressed understanding and was in agreement with this plan. She also understands that She can call clinic at any time with any questions, concerns, or complaints.   Cancer Staging Malignant neoplasm of ovary [facility]) Staging form: Ovary, Fallopian Tube, and Primary Peritoneal Carcinoma, AJCC 8th Edition - Clinical stage from 05/29/2017: Stage IIIC (cT3c, cN1b, cM0) - Signed by Lloyd Huger, MD on 05/29/2017   Lloyd Huger, MD   12/29/2019 6:59 AM

## 2019-12-27 ENCOUNTER — Other Ambulatory Visit: Payer: Self-pay | Admitting: *Deleted

## 2019-12-27 MED ORDER — TRAMADOL HCL 50 MG PO TABS: 50.0000 mg | ORAL_TABLET | Freq: Four times a day (QID) | ORAL | 0 refills | Status: DC | PRN

## 2019-12-27 NOTE — Telephone Encounter (Signed)
Son called asking for pain medicine for this patient who is having severe back pain and Tylenol is not helping. Please advise

## 2019-12-27 NOTE — Telephone Encounter (Signed)
Lets try Tramadol 50mg 

## 2019-12-28 ENCOUNTER — Other Ambulatory Visit: Payer: Self-pay

## 2019-12-28 ENCOUNTER — Encounter: Payer: Self-pay | Admitting: Oncology

## 2019-12-28 ENCOUNTER — Inpatient Hospital Stay (HOSPITAL_BASED_OUTPATIENT_CLINIC_OR_DEPARTMENT_OTHER): Payer: Medicare PPO | Admitting: Oncology

## 2019-12-28 ENCOUNTER — Inpatient Hospital Stay: Payer: Medicare PPO

## 2019-12-28 VITALS — BP 142/67 | HR 82 | Temp 96.9°F | Resp 20 | Wt 100.6 lb

## 2019-12-28 DIAGNOSIS — C569 Malignant neoplasm of unspecified ovary: Secondary | ICD-10-CM

## 2019-12-28 DIAGNOSIS — Z95828 Presence of other vascular implants and grafts: Secondary | ICD-10-CM

## 2019-12-28 DIAGNOSIS — Z5111 Encounter for antineoplastic chemotherapy: Secondary | ICD-10-CM | POA: Diagnosis not present

## 2019-12-28 LAB — COMPREHENSIVE METABOLIC PANEL
ALT: 16 U/L (ref 0–44)
AST: 24 U/L (ref 15–41)
Albumin: 3.5 g/dL (ref 3.5–5.0)
Alkaline Phosphatase: 75 U/L (ref 38–126)
Anion gap: 8 (ref 5–15)
BUN: 28 mg/dL — ABNORMAL HIGH (ref 8–23)
CO2: 24 mmol/L (ref 22–32)
Calcium: 8.3 mg/dL — ABNORMAL LOW (ref 8.9–10.3)
Chloride: 96 mmol/L — ABNORMAL LOW (ref 98–111)
Creatinine, Ser: 0.86 mg/dL (ref 0.44–1.00)
GFR calc Af Amer: >60 mL/min (ref >60)
GFR calc non Af Amer: 59 mL/min — ABNORMAL LOW (ref >60)
Glucose, Bld: 110 mg/dL — ABNORMAL HIGH (ref 70–99)
Potassium: 4.7 mmol/L (ref 3.5–5.1)
Sodium: 128 mmol/L — ABNORMAL LOW (ref 135–145)
Total Bilirubin: 0.5 mg/dL (ref 0.3–1.2)
Total Protein: 6.4 g/dL — ABNORMAL LOW (ref 6.5–8.1)

## 2019-12-28 LAB — CBC WITH DIFFERENTIAL/PLATELET
Abs Immature Granulocytes: 0.02 10*3/uL (ref 0.00–0.07)
Basophils Absolute: 0 10*3/uL (ref 0.0–0.1)
Basophils Relative: 1 %
Eosinophils Absolute: 0 10*3/uL (ref 0.0–0.5)
Eosinophils Relative: 1 %
HCT: 28.5 % — ABNORMAL LOW (ref 36.0–46.0)
Hemoglobin: 9.4 g/dL — ABNORMAL LOW (ref 12.0–15.0)
Immature Granulocytes: 1 %
Lymphocytes Relative: 18 %
Lymphs Abs: 0.8 10*3/uL (ref 0.7–4.0)
MCH: 30 pg (ref 26.0–34.0)
MCHC: 33 g/dL (ref 30.0–36.0)
MCV: 91.1 fL (ref 80.0–100.0)
Monocytes Absolute: 0.2 10*3/uL (ref 0.1–1.0)
Monocytes Relative: 5 %
Neutro Abs: 3.2 10*3/uL (ref 1.7–7.7)
Neutrophils Relative %: 74 %
Platelets: 204 10*3/uL (ref 150–400)
RBC: 3.13 MIL/uL — ABNORMAL LOW (ref 3.87–5.11)
RDW: 13.9 % (ref 11.5–15.5)
WBC: 4.2 10*3/uL (ref 4.0–10.5)
nRBC: 0 % (ref 0.0–0.2)

## 2019-12-28 MED ORDER — PROCHLORPERAZINE MALEATE 10 MG PO TABS
10.0000 mg | ORAL_TABLET | Freq: Once | ORAL | Status: AC
  Administered 2019-12-28: 10 mg via ORAL
  Filled 2019-12-28: qty 1

## 2019-12-28 MED ORDER — HEPARIN SOD (PORK) LOCK FLUSH 100 UNIT/ML IV SOLN
INTRAVENOUS | Status: AC
  Filled 2019-12-28: qty 5

## 2019-12-28 MED ORDER — SODIUM CHLORIDE 0.9 % IV SOLN
1000.0000 mg | Freq: Once | INTRAVENOUS | Status: AC
  Administered 2019-12-28: 988.5932 mg via INTRAVENOUS
  Filled 2019-12-28: qty 26

## 2019-12-28 MED ORDER — SODIUM CHLORIDE 0.9 % IV SOLN
Freq: Once | INTRAVENOUS | Status: AC
  Filled 2019-12-28: qty 250

## 2019-12-28 MED ORDER — HEPARIN SOD (PORK) LOCK FLUSH 100 UNIT/ML IV SOLN
500.0000 [IU] | Freq: Once | INTRAVENOUS | Status: DC | PRN
  Filled 2019-12-28: qty 5

## 2019-12-28 MED ORDER — SODIUM CHLORIDE 0.9% FLUSH
10.0000 mL | INTRAVENOUS | Status: DC | PRN
  Administered 2019-12-28: 10 mL
  Filled 2019-12-28: qty 10

## 2019-12-28 MED ORDER — SODIUM CHLORIDE 0.9% FLUSH
10.0000 mL | INTRAVENOUS | Status: DC | PRN
  Administered 2019-12-28: 10 mL via INTRAVENOUS
  Filled 2019-12-28: qty 10

## 2019-12-28 NOTE — Progress Notes (Signed)
Patient here today for follow up. Reports some back pain since Sunday, but states provider prescribed medication yesterday. She states medication is helping with pain. She reports no other concerns at this time.

## 2019-12-29 ENCOUNTER — Other Ambulatory Visit: Payer: Self-pay | Admitting: Oncology

## 2019-12-29 LAB — CA 125: Cancer Antigen (CA) 125: 214 U/mL — ABNORMAL HIGH (ref 0.0–38.1)

## 2019-12-31 NOTE — Progress Notes (Signed)
[place]  Telephone:(336) [phone] Fax:(336) [phone]  ID: Tamara Mckinney OB: [birth year]  MR#: [id number]  [id number]  Patient Care Team: [name], MD as PCP - General (Family Medicine) [name], RN as Registered Nurse Herbert Pun, MD as Consulting Physician (General Surgery) Lloyd Huger, MD as Consulting Physician (Oncology)  CHIEF COMPLAINT: Stage IIIC ovarian cancer.  INTERVAL HISTORY: Patient returns to clinic today for further evaluation and consideration of cycle 4, day 15 of single agent gemcitabine.  She continues to have back pain that is well controlled with tramadol, but admits she does not take it on a regular basis.  She also has some mildly increased abdominal tenderness.  She has occasional constipation and bright red blood per rectum.  She has no neurologic complaints.  She has a good appetite and her weight has remained stable.  She denies any chest pain, shortness of breath, cough, or hemoptysis.  She denies any nausea or vomiting.  She has no urinary complaints.  Patient offers no further specific complaints today.  REVIEW OF SYSTEMS:   Review of Systems  Constitutional: Negative.  Negative for fever, malaise/fatigue and weight loss.  Respiratory: Negative.  Negative for cough and shortness of breath.   Cardiovascular: Negative.  Negative for chest pain and leg swelling.  Gastrointestinal: Positive for abdominal pain and constipation. Negative for blood in stool, diarrhea, heartburn, melena, nausea and vomiting.  Genitourinary: Negative.  Negative for dysuria.  Musculoskeletal: Positive for back pain. Negative for neck pain.  Skin: Negative.  Negative for itching and rash.  Neurological: Negative.  Negative for dizziness, focal weakness and weakness.  Endo/Heme/Allergies: Does not bruise/bleed easily.  Psychiatric/Behavioral: Positive for memory loss. The patient is not nervous/anxious.     As per HPI.  Otherwise, a complete review of systems is negative.  PAST MEDICAL HISTORY: Past Medical History:  Diagnosis Date  . Anemia   . Arthritis   . Asthma   . Dyspnea   . GERD (gastroesophageal reflux disease)   . Hypertension   . Ovarian cancer ([place]) 2018    PAST SURGICAL HISTORY: Past Surgical History:  Procedure Laterality Date  . ABDOMINAL HYSTERECTOMY    . BREAST BIOPSY     x6  . BUNIONECTOMY Bilateral   . CATARACT EXTRACTION, BILATERAL    . FLEXIBLE SIGMOIDOSCOPY N/A 05/21/2017   Procedure: FLEXIBLE SIGMOIDOSCOPY;  Surgeon: Lin Landsman, MD;  Location: [facility] ENDOSCOPY;  Service: Gastroenterology;  Laterality: N/A;  . INCONTINENCE SURGERY    . PORTA CATH INSERTION N/A 06/16/2017   Procedure: PORTA CATH INSERTION;  Surgeon: Algernon Huxley, MD;  Location: [place] CV LAB;  Service: Cardiovascular;  Laterality: N/A;  . RECTAL SURGERY  04/2018  . REPAIR OF RECTAL PROLAPSE N/A 05/11/2017   Procedure: REPAIR OF RECTAL PROLAPSE;  Surgeon: Leonie Green, MD;  Location: [facility] ORS;  Service: General;  Laterality: N/A;  . TONSILLECTOMY    . VAGINA SURGERY     uncertain procedure performed    FAMILY HISTORY: Family History  Problem Relation Age of Onset  . Heart disease Mother   . Heart disease Father   . Heart disease Sister   . Heart attack Sister   . Ulcerative colitis Brother   . Lung cancer Brother   . Thyroid cancer Sister   . Asthma Sister   . Diabetes Sister   . Asthma Sister   . Pancreatic cancer Sister   . Dementia Sister   . Asthma Brother   .  Heart disease Brother   . Asthma Brother   . Lung cancer Brother   . Asthma Brother   . Lung cancer Brother   . Lung cancer Brother   . Rheum arthritis Brother     ADVANCED DIRECTIVES (Y/N):  N  HEALTH MAINTENANCE: Social History   Tobacco Use  . Smoking status: Never Smoker  . Smokeless tobacco: Never Used  Substance Use Topics  . Alcohol use: No  . Drug use: No     Colonoscopy:  PAP:  Bone  density:  Lipid panel:  No Known Allergies  Current Outpatient Medications  Medication Sig Dispense Refill  . acetaminophen (TYLENOL) 500 MG tablet Take 500 mg by mouth every 6 (six) hours as needed.    . cyclobenzaprine (FLEXERIL) 10 MG tablet TAKE ONE TABLET BY MOUTH THREE TIMES A DAY AS NEEDED 30 tablet 0  . esomeprazole (NEXIUM) 40 MG capsule Take 40 mg by mouth daily before breakfast.     . lidocaine-prilocaine (EMLA) cream APPLY A SMALL AMOUNT TO PORT SITE AT LEAST 1 HOUR PRIOR TO IT BEING ACCESSED THEN COVER WITH PLASTIC WRAP 30 g 0  . montelukast (SINGULAIR) 10 MG tablet Take 10 mg by mouth at bedtime.    . ondansetron (ZOFRAN) 8 MG tablet Take 1 tablet (8 mg total) by mouth 2 (two) times daily as needed for nausea or vomiting. 30 tablet 2  . polyethylene glycol (MIRALAX / GLYCOLAX) packet Take 17 g by mouth daily as needed.     . traMADol (ULTRAM) 50 MG tablet Take 1 tablet (50 mg total) by mouth every 6 (six) hours as needed. 30 tablet 0  . valsartan (DIOVAN) 160 MG tablet Start 1/2 tab a day.  Can increase to 1 tab after a few weeks if BP stays high     No current facility-administered medications for this visit.   Facility-Administered Medications Ordered in Other Visits  Medication Dose Route Frequency Provider Last Rate Last Admin  . sodium chloride flush (NS) 0.9 % injection 10 mL  10 mL Intravenous PRN Lloyd Huger, MD   10 mL at 03/10/18 1200    OBJECTIVE: Vitals:   01/04/20 1025  BP: 136/80  Pulse: 95  Resp: 18  Temp: 97.8 F (36.6 C)  SpO2: 100%     Body mass index is 21.12 kg/m.    ECOG FS:0 - Asymptomatic  General: Well-developed, well-nourished, no acute distress. Eyes: Pink conjunctiva, anicteric sclera. HEENT: Normocephalic, moist mucous membranes. Lungs: No audible wheezing or coughing. Heart: Regular rate and rhythm. Abdomen: Soft, nontender, no obvious distention. Musculoskeletal: No edema, cyanosis, or clubbing. Neuro: Alert, answering all  questions appropriately. Cranial nerves grossly intact. Skin: No rashes or petechiae noted. Psych: Normal affect.   LAB RESULTS:  Lab Results  Component Value Date   NA 129 (L) 01/04/2020   K 4.1 01/04/2020   CL 95 (L) 01/04/2020   CO2 26 01/04/2020   GLUCOSE 99 01/04/2020   BUN 24 (H) 01/04/2020   CREATININE 0.87 01/04/2020   CALCIUM 8.4 (L) 01/04/2020   PROT 6.6 01/04/2020   ALBUMIN 3.5 01/04/2020   AST 21 01/04/2020   ALT 18 01/04/2020   ALKPHOS 78 01/04/2020   BILITOT 0.5 01/04/2020   GFRNONAA 59 (L) 01/04/2020   GFRAA >60 01/04/2020    Lab Results  Component Value Date   WBC 3.5 (L) 01/04/2020   NEUTROABS 2.6 01/04/2020   HGB 8.8 (L) 01/04/2020   HCT 26.4 (L) 01/04/2020   MCV  90.7 01/04/2020   PLT 140 (L) 01/04/2020     STUDIES: No results found.  ASSESSMENT: Stage IIIC ovarian cancer.  PLAN:    1. Stage IIIC ovarian cancer: CT scan results from November 14, 2019 reviewed independently with disease progression with enlargement of omental and peritoneal implants.  She also has new lesions within the right pelvis and hepatic lobe.  Her CA-125 was initially elevated to 326, but now has trended down to 162.  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 Plan to give treatment on days 1, 8, and 15 with a 22 off.  Although given her history of thrombocytopenia, patient may only be able to receive treatment on days 1 and 8.  Proceed with cycle 4, day 15 of treatment today.  Return to clinic in 2 weeks for further evaluation and consideration of cycle 5, day 1. 2. Lupus anticoagulant: Patient was noted to have an elevated PTT as well as increased bleeding during her surgery for rectal prolapse.   3. Anemia: Hemoglobin has trended down slightly to 8.8.  Previously, iron stores are within normal limits.  4.  Constipation: Continue OTC remedies as needed. 5.  Neutropenia/thrombocytopenia: Mild, monitor. 6.  Hyponatremia: Chronic and  unchanged.  Sodium is 129 today. 7.  Back/abdominal pain: Patient has been instructed to use tramadol as directed. 8.  Rectal bleeding: Likely secondary to hemorrhoids, monitor.  Patient also has a history of rectal prolapse and can consider referral back to surgery if necessary.   Patient expressed understanding and was in agreement with this plan. She also understands that She can call clinic at any time with any questions, concerns, or complaints.   Cancer Staging Malignant neoplasm of ovary [facility]) Staging form: Ovary, Fallopian Tube, and Primary Peritoneal Carcinoma, AJCC 8th Edition - Clinical stage from 05/29/2017: Stage IIIC (cT3c, cN1b, cM0) - Signed by Lloyd Huger, MD on 05/29/2017   Lloyd Huger, MD   01/05/2020 6:37 AM

## 2020-01-04 ENCOUNTER — Encounter: Payer: Self-pay | Admitting: Oncology

## 2020-01-04 ENCOUNTER — Inpatient Hospital Stay: Payer: Medicare PPO

## 2020-01-04 ENCOUNTER — Other Ambulatory Visit: Payer: Self-pay

## 2020-01-04 ENCOUNTER — Inpatient Hospital Stay (HOSPITAL_BASED_OUTPATIENT_CLINIC_OR_DEPARTMENT_OTHER): Payer: Medicare PPO | Admitting: Oncology

## 2020-01-04 VITALS — BP 136/80 | HR 95 | Temp 97.8°F | Resp 18 | Wt 99.3 lb

## 2020-01-04 DIAGNOSIS — C569 Malignant neoplasm of unspecified ovary: Secondary | ICD-10-CM

## 2020-01-04 DIAGNOSIS — Z5111 Encounter for antineoplastic chemotherapy: Secondary | ICD-10-CM | POA: Diagnosis not present

## 2020-01-04 LAB — COMPREHENSIVE METABOLIC PANEL
ALT: 18 U/L (ref 0–44)
AST: 21 U/L (ref 15–41)
Albumin: 3.5 g/dL (ref 3.5–5.0)
Alkaline Phosphatase: 78 U/L (ref 38–126)
Anion gap: 8 (ref 5–15)
BUN: 24 mg/dL — ABNORMAL HIGH (ref 8–23)
CO2: 26 mmol/L (ref 22–32)
Calcium: 8.4 mg/dL — ABNORMAL LOW (ref 8.9–10.3)
Chloride: 95 mmol/L — ABNORMAL LOW (ref 98–111)
Creatinine, Ser: 0.87 mg/dL (ref 0.44–1.00)
GFR calc Af Amer: >60 mL/min (ref >60)
GFR calc non Af Amer: 59 mL/min — ABNORMAL LOW (ref >60)
Glucose, Bld: 99 mg/dL (ref 70–99)
Potassium: 4.1 mmol/L (ref 3.5–5.1)
Sodium: 129 mmol/L — ABNORMAL LOW (ref 135–145)
Total Bilirubin: 0.5 mg/dL (ref 0.3–1.2)
Total Protein: 6.6 g/dL (ref 6.5–8.1)

## 2020-01-04 LAB — CBC WITH DIFFERENTIAL/PLATELET
Abs Immature Granulocytes: 0.01 10*3/uL (ref 0.00–0.07)
Basophils Absolute: 0 10*3/uL (ref 0.0–0.1)
Basophils Relative: 0 %
Eosinophils Absolute: 0 10*3/uL (ref 0.0–0.5)
Eosinophils Relative: 0 %
HCT: 26.4 % — ABNORMAL LOW (ref 36.0–46.0)
Hemoglobin: 8.8 g/dL — ABNORMAL LOW (ref 12.0–15.0)
Immature Granulocytes: 0 %
Lymphocytes Relative: 19 %
Lymphs Abs: 0.6 10*3/uL — ABNORMAL LOW (ref 0.7–4.0)
MCH: 30.2 pg (ref 26.0–34.0)
MCHC: 33.3 g/dL (ref 30.0–36.0)
MCV: 90.7 fL (ref 80.0–100.0)
Monocytes Absolute: 0.2 10*3/uL (ref 0.1–1.0)
Monocytes Relative: 5 %
Neutro Abs: 2.6 10*3/uL (ref 1.7–7.7)
Neutrophils Relative %: 76 %
Platelets: 140 10*3/uL — ABNORMAL LOW (ref 150–400)
RBC: 2.91 MIL/uL — ABNORMAL LOW (ref 3.87–5.11)
RDW: 13.5 % (ref 11.5–15.5)
WBC: 3.5 10*3/uL — ABNORMAL LOW (ref 4.0–10.5)
nRBC: 0 % (ref 0.0–0.2)

## 2020-01-04 MED ORDER — HEPARIN SOD (PORK) LOCK FLUSH 100 UNIT/ML IV SOLN
500.0000 [IU] | Freq: Once | INTRAVENOUS | Status: AC
  Administered 2020-01-04: 500 [IU] via INTRAVENOUS
  Filled 2020-01-04: qty 5

## 2020-01-04 MED ORDER — SODIUM CHLORIDE 0.9% FLUSH
10.0000 mL | Freq: Once | INTRAVENOUS | Status: AC
  Administered 2020-01-04: 10 mL via INTRAVENOUS
  Filled 2020-01-04: qty 10

## 2020-01-04 MED ORDER — HEPARIN SOD (PORK) LOCK FLUSH 100 UNIT/ML IV SOLN
500.0000 [IU] | Freq: Once | INTRAVENOUS | Status: DC | PRN
  Filled 2020-01-04: qty 5

## 2020-01-04 MED ORDER — SODIUM CHLORIDE 0.9 % IV SOLN
1000.0000 mg | Freq: Once | INTRAVENOUS | Status: AC
  Administered 2020-01-04: 988.5932 mg via INTRAVENOUS
  Filled 2020-01-04: qty 26

## 2020-01-04 MED ORDER — HEPARIN SOD (PORK) LOCK FLUSH 100 UNIT/ML IV SOLN
INTRAVENOUS | Status: AC
  Filled 2020-01-04: qty 5

## 2020-01-04 MED ORDER — SODIUM CHLORIDE 0.9% FLUSH
10.0000 mL | INTRAVENOUS | Status: DC | PRN
  Filled 2020-01-04: qty 10

## 2020-01-04 MED ORDER — SODIUM CHLORIDE 0.9 % IV SOLN
Freq: Once | INTRAVENOUS | Status: AC
  Filled 2020-01-04: qty 250

## 2020-01-04 MED ORDER — PROCHLORPERAZINE MALEATE 10 MG PO TABS
10.0000 mg | ORAL_TABLET | Freq: Once | ORAL | Status: AC
  Administered 2020-01-04: 10 mg via ORAL
  Filled 2020-01-04: qty 1

## 2020-01-04 NOTE — Progress Notes (Signed)
Pt here for follow up. No complaints or concerns.  

## 2020-01-05 LAB — CA 125: Cancer Antigen (CA) 125: 162 U/mL — ABNORMAL HIGH (ref 0.0–38.1)

## 2020-01-11 NOTE — Progress Notes (Signed)
Pharmacist Chemotherapy Monitoring - Follow Up Assessment    I verify that I have reviewed each item in the below checklist:  . Regimen for the patient is scheduled for the appropriate day and plan matches scheduled date. Marland Kitchen Appropriate non-routine labs are ordered dependent on drug ordered. . If applicable, additional medications reviewed and ordered per protocol based on lifetime cumulative doses and/or treatment regimen.   Plan for follow-up and/or issues identified: No . I-vent associated with next due treatment: No . MD and/or nursing notified: No  Tamara Mckinney K 01/11/2020 9:13 AM

## 2020-01-12 NOTE — Progress Notes (Signed)
[place]  [phone] Fax:(336) [phone]  ID: Tamara Mckinney OB: 06/26/1928  MR#: [id number]  [id number]  Patient Care Team: [name], MD as PCP - General (Family Medicine) [name], RN as Registered Nurse Herbert Pun, MD as Consulting Physician (General Surgery) Lloyd Huger, MD as Consulting Physician (Oncology)  CHIEF COMPLAINT: Stage IIIC ovarian cancer.  INTERVAL HISTORY: Patient returns to clinic today for further evaluation and consideration of cycle 5, day 1 of single agent gemcitabine.  She continues to have occasional back pain and memory complaints, but otherwise feels well.  She has occasional constipation and bright red blood per rectum.  She has no neurologic complaints.  She has a good appetite and her weight has remained stable.  She denies any chest pain, shortness of breath, cough, or hemoptysis.  She denies any nausea or vomiting.  She has no urinary complaints. Patient offers no further specific complaints today.  REVIEW OF SYSTEMS:   Review of Systems  Constitutional: Negative.  Negative for fever, malaise/fatigue and weight loss.  Respiratory: Negative.  Negative for cough and shortness of breath.   Cardiovascular: Negative.  Negative for chest pain and leg swelling.  Gastrointestinal: Negative.  Negative for abdominal pain, blood in stool, constipation, diarrhea, heartburn, melena, nausea and vomiting.  Genitourinary: Negative.  Negative for dysuria.  Musculoskeletal: Positive for back pain. Negative for neck pain.  Skin: Negative.  Negative for itching and rash.  Neurological: Negative.  Negative for dizziness, focal weakness and weakness.  Endo/Heme/Allergies: Does not bruise/bleed easily.  Psychiatric/Behavioral: Positive for memory loss. The patient is not nervous/anxious.     As per HPI. Otherwise, a complete review of systems is negative.  PAST MEDICAL HISTORY: Past Medical History:    Diagnosis Date  . Anemia   . Arthritis   . Asthma   . Dyspnea   . GERD (gastroesophageal reflux disease)   . Hypertension   . Ovarian cancer ([place]) 2018    PAST SURGICAL HISTORY: Past Surgical History:  Procedure Laterality Date  . ABDOMINAL HYSTERECTOMY    . BREAST BIOPSY     x6  . BUNIONECTOMY Bilateral   . CATARACT EXTRACTION, BILATERAL    . FLEXIBLE SIGMOIDOSCOPY N/A 05/21/2017   Procedure: FLEXIBLE SIGMOIDOSCOPY;  Surgeon: Lin Landsman, MD;  Location: [facility] ENDOSCOPY;  Service: Gastroenterology;  Laterality: N/A;  . INCONTINENCE SURGERY    . PORTA CATH INSERTION N/A 06/16/2017   Procedure: PORTA CATH INSERTION;  Surgeon: Algernon Huxley, MD;  Location: [place] CV LAB;  Service: Cardiovascular;  Laterality: N/A;  . RECTAL SURGERY  04/2018  . REPAIR OF RECTAL PROLAPSE N/A 05/11/2017   Procedure: REPAIR OF RECTAL PROLAPSE;  Surgeon: Leonie Green, MD;  Location: [facility] ORS;  Service: General;  Laterality: N/A;  . TONSILLECTOMY    . VAGINA SURGERY     uncertain procedure performed    FAMILY HISTORY: Family History  Problem Relation Age of Onset  . Heart disease Mother   . Heart disease Father   . Heart disease Sister   . Heart attack Sister   . Ulcerative colitis Brother   . Lung cancer Brother   . Thyroid cancer Sister   . Asthma Sister   . Diabetes Sister   . Asthma Sister   . Pancreatic cancer Sister   . Dementia Sister   . Asthma Brother   . Heart disease Brother   . Asthma Brother   . Lung cancer Brother   . Asthma  Brother   . Lung cancer Brother   . Lung cancer Brother   . Rheum arthritis Brother     ADVANCED DIRECTIVES (Y/N):  N  HEALTH MAINTENANCE: Social History   Tobacco Use  . Smoking status: Never Smoker  . Smokeless tobacco: Never Used  Substance Use Topics  . Alcohol use: No  . Drug use: No     Colonoscopy:  PAP:  Bone density:  Lipid panel:  No Known Allergies  Current Outpatient Medications  Medication Sig  Dispense Refill  . acetaminophen (TYLENOL) 500 MG tablet Take 500 mg by mouth every 6 (six) hours as needed.    . cyclobenzaprine (FLEXERIL) 10 MG tablet TAKE ONE TABLET BY MOUTH THREE TIMES A DAY AS NEEDED 30 tablet 0  . esomeprazole (NEXIUM) 40 MG capsule Take 40 mg by mouth daily before breakfast.     . lidocaine-prilocaine (EMLA) cream APPLY A SMALL AMOUNT TO PORT SITE AT LEAST 1 HOUR PRIOR TO IT BEING ACCESSED THEN COVER WITH PLASTIC WRAP 30 g 0  . montelukast (SINGULAIR) 10 MG tablet Take 10 mg by mouth at bedtime.    . ondansetron (ZOFRAN) 8 MG tablet Take 1 tablet (8 mg total) by mouth 2 (two) times daily as needed for nausea or vomiting. 30 tablet 2  . polyethylene glycol (MIRALAX / GLYCOLAX) packet Take 17 g by mouth daily as needed.     . traMADol (ULTRAM) 50 MG tablet Take 1 tablet (50 mg total) by mouth every 6 (six) hours as needed. 30 tablet 0  . valsartan (DIOVAN) 160 MG tablet Start 1/2 tab a day.  Can increase to 1 tab after a few weeks if BP stays high     No current facility-administered medications for this visit.   Facility-Administered Medications Ordered in Other Visits  Medication Dose Route Frequency Provider Last Rate Last Admin  . sodium chloride flush (NS) 0.9 % injection 10 mL  10 mL Intravenous PRN Lloyd Huger, MD   10 mL at 03/10/18 1200  . sodium chloride flush (NS) 0.9 % injection 10 mL  10 mL Intravenous PRN Lloyd Huger, MD   10 mL at 01/18/20 0950    OBJECTIVE: Vitals:   01/18/20 1003  BP: (!) 147/82  Pulse: (!) 102  Temp: 97.9 F (36.6 C)  SpO2: 98%     Body mass index is 20.48 kg/m.    ECOG FS:0 - Asymptomatic  General: Thin, no acute distress. Eyes: Pink conjunctiva, anicteric sclera. HEENT: Normocephalic, moist mucous membranes. Lungs: No audible wheezing or coughing. Heart: Regular rate and rhythm. Abdomen: Soft, nontender, no obvious distention. Musculoskeletal: No edema, cyanosis, or clubbing. Neuro: Alert, answering all  questions appropriately. Cranial nerves grossly intact. Skin: No rashes or petechiae noted. Psych: Normal affect.   LAB RESULTS:  Lab Results  Component Value Date   NA 131 (L) 01/18/2020   K 4.7 01/18/2020   CL 96 (L) 01/18/2020   CO2 25 01/18/2020   GLUCOSE 108 (H) 01/18/2020   BUN 26 (H) 01/18/2020   CREATININE 0.79 01/18/2020   CALCIUM 8.6 (L) 01/18/2020   PROT 6.7 01/18/2020   ALBUMIN 3.6 01/18/2020   AST 22 01/18/2020   ALT 13 01/18/2020   ALKPHOS 81 01/18/2020   BILITOT 0.5 01/18/2020   GFRNONAA >60 01/18/2020   GFRAA >60 01/18/2020    Lab Results  Component Value Date   WBC 7.7 01/18/2020   NEUTROABS 5.9 01/18/2020   HGB 9.3 (L) 01/18/2020   HCT  28.4 (L) 01/18/2020   MCV 92.2 01/18/2020   PLT 668 (H) 01/18/2020     STUDIES: No results found.  ASSESSMENT: Stage IIIC ovarian cancer.  PLAN:    1. Stage IIIC ovarian cancer: CT scan results from November 14, 2019 reviewed independently with disease progression with enlargement of omental and peritoneal implants.  She also has new lesions within the right pelvis and hepatic lobe.  Her CA-125 was initially elevated to 326, but now has trended down to 162.  Today's result is pending.  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 Plan to give treatment on days 1, 8, and 15 with a 22 off.  Although given her history of thrombocytopenia, patient may only be able to receive treatment on days 1 and 8.  Proceed with cycle 5, day 1 of gemcitabine today.  Return to clinic in 1 week for laboratory work and treatment and then in 2 weeks for laboratory work, further evaluation, and consideration of cycle 5, day 15. 2. Lupus anticoagulant: Patient was noted to have an elevated PTT as well as increased bleeding during her surgery for rectal prolapse.   3. Anemia: Hemoglobin mildly improved to 9.3.  Previously, iron stores are within normal limits.  4.  Constipation: Patient does not complain of  this today.  Continue OTC remedies as needed. 5.  Neutropenia/thrombocytopenia: Resolved.  Patient now has a mild thrombocytosis. 6.  Hyponatremia: Chronic and unchanged.  Sodium is 131 today. 7.  Back/abdominal pain: Continue tramadol as needed. 8.  Rectal bleeding: Likely secondary to hemorrhoids, monitor.  Patient also has a history of rectal prolapse and can consider referral back to surgery if necessary.   Patient expressed understanding and was in agreement with this plan. She also understands that She can call clinic at any time with any questions, concerns, or complaints.   Cancer Staging Malignant neoplasm of ovary [facility]) Staging form: Ovary, Fallopian Tube, and Primary Peritoneal Carcinoma, AJCC 8th Edition - Clinical stage from 05/29/2017: Stage IIIC (cT3c, cN1b, cM0) - Signed by Lloyd Huger, MD on 05/29/2017   Lloyd Huger, MD   01/18/2020 10:28 AM

## 2020-01-17 ENCOUNTER — Encounter: Payer: Self-pay | Admitting: Oncology

## 2020-01-17 NOTE — Progress Notes (Signed)
Patient called for prescreening. Denies having any pain or concerns. States she just feeling very tired.

## 2020-01-18 ENCOUNTER — Inpatient Hospital Stay: Payer: Medicare PPO

## 2020-01-18 ENCOUNTER — Other Ambulatory Visit: Payer: Self-pay

## 2020-01-18 ENCOUNTER — Inpatient Hospital Stay: Payer: Medicare PPO | Attending: Oncology

## 2020-01-18 ENCOUNTER — Inpatient Hospital Stay (HOSPITAL_BASED_OUTPATIENT_CLINIC_OR_DEPARTMENT_OTHER): Payer: Medicare PPO | Admitting: Oncology

## 2020-01-18 VITALS — BP 147/82 | HR 102 | Temp 97.9°F | Wt 96.3 lb

## 2020-01-18 VITALS — HR 100

## 2020-01-18 DIAGNOSIS — C569 Malignant neoplasm of unspecified ovary: Secondary | ICD-10-CM

## 2020-01-18 DIAGNOSIS — M549 Dorsalgia, unspecified: Secondary | ICD-10-CM | POA: Insufficient documentation

## 2020-01-18 DIAGNOSIS — K625 Hemorrhage of anus and rectum: Secondary | ICD-10-CM | POA: Insufficient documentation

## 2020-01-18 DIAGNOSIS — D649 Anemia, unspecified: Secondary | ICD-10-CM | POA: Diagnosis not present

## 2020-01-18 DIAGNOSIS — I10 Essential (primary) hypertension: Secondary | ICD-10-CM | POA: Insufficient documentation

## 2020-01-18 DIAGNOSIS — Z801 Family history of malignant neoplasm of trachea, bronchus and lung: Secondary | ICD-10-CM | POA: Diagnosis not present

## 2020-01-18 DIAGNOSIS — R109 Unspecified abdominal pain: Secondary | ICD-10-CM | POA: Insufficient documentation

## 2020-01-18 DIAGNOSIS — R5383 Other fatigue: Secondary | ICD-10-CM | POA: Insufficient documentation

## 2020-01-18 DIAGNOSIS — E871 Hypo-osmolality and hyponatremia: Secondary | ICD-10-CM | POA: Diagnosis not present

## 2020-01-18 DIAGNOSIS — R531 Weakness: Secondary | ICD-10-CM | POA: Diagnosis not present

## 2020-01-18 DIAGNOSIS — R5381 Other malaise: Secondary | ICD-10-CM | POA: Insufficient documentation

## 2020-01-18 DIAGNOSIS — K5900 Constipation, unspecified: Secondary | ICD-10-CM | POA: Diagnosis not present

## 2020-01-18 DIAGNOSIS — Z5111 Encounter for antineoplastic chemotherapy: Secondary | ICD-10-CM | POA: Insufficient documentation

## 2020-01-18 DIAGNOSIS — M1990 Unspecified osteoarthritis, unspecified site: Secondary | ICD-10-CM | POA: Insufficient documentation

## 2020-01-18 DIAGNOSIS — D6862 Lupus anticoagulant syndrome: Secondary | ICD-10-CM | POA: Insufficient documentation

## 2020-01-18 DIAGNOSIS — Z95828 Presence of other vascular implants and grafts: Secondary | ICD-10-CM

## 2020-01-18 DIAGNOSIS — Z79899 Other long term (current) drug therapy: Secondary | ICD-10-CM | POA: Diagnosis not present

## 2020-01-18 DIAGNOSIS — K219 Gastro-esophageal reflux disease without esophagitis: Secondary | ICD-10-CM | POA: Diagnosis not present

## 2020-01-18 LAB — COMPREHENSIVE METABOLIC PANEL
ALT: 13 U/L (ref 0–44)
AST: 22 U/L (ref 15–41)
Albumin: 3.6 g/dL (ref 3.5–5.0)
Alkaline Phosphatase: 81 U/L (ref 38–126)
Anion gap: 10 (ref 5–15)
BUN: 26 mg/dL — ABNORMAL HIGH (ref 8–23)
CO2: 25 mmol/L (ref 22–32)
Calcium: 8.6 mg/dL — ABNORMAL LOW (ref 8.9–10.3)
Chloride: 96 mmol/L — ABNORMAL LOW (ref 98–111)
Creatinine, Ser: 0.79 mg/dL (ref 0.44–1.00)
GFR calc Af Amer: >60 mL/min (ref >60)
GFR calc non Af Amer: >60 mL/min (ref >60)
Glucose, Bld: 108 mg/dL — ABNORMAL HIGH (ref 70–99)
Potassium: 4.7 mmol/L (ref 3.5–5.1)
Sodium: 131 mmol/L — ABNORMAL LOW (ref 135–145)
Total Bilirubin: 0.5 mg/dL (ref 0.3–1.2)
Total Protein: 6.7 g/dL (ref 6.5–8.1)

## 2020-01-18 LAB — CBC WITH DIFFERENTIAL/PLATELET
Abs Immature Granulocytes: 0.08 10*3/uL — ABNORMAL HIGH (ref 0.00–0.07)
Basophils Absolute: 0 10*3/uL (ref 0.0–0.1)
Basophils Relative: 1 %
Eosinophils Absolute: 0 10*3/uL (ref 0.0–0.5)
Eosinophils Relative: 0 %
HCT: 28.4 % — ABNORMAL LOW (ref 36.0–46.0)
Hemoglobin: 9.3 g/dL — ABNORMAL LOW (ref 12.0–15.0)
Immature Granulocytes: 1 %
Lymphocytes Relative: 9 %
Lymphs Abs: 0.7 10*3/uL (ref 0.7–4.0)
MCH: 30.2 pg (ref 26.0–34.0)
MCHC: 32.7 g/dL (ref 30.0–36.0)
MCV: 92.2 fL (ref 80.0–100.0)
Monocytes Absolute: 1 10*3/uL (ref 0.1–1.0)
Monocytes Relative: 13 %
Neutro Abs: 5.9 10*3/uL (ref 1.7–7.7)
Neutrophils Relative %: 76 %
Platelets: 668 10*3/uL — ABNORMAL HIGH (ref 150–400)
RBC: 3.08 MIL/uL — ABNORMAL LOW (ref 3.87–5.11)
RDW: 15 % (ref 11.5–15.5)
WBC: 7.7 10*3/uL (ref 4.0–10.5)
nRBC: 0 % (ref 0.0–0.2)

## 2020-01-18 MED ORDER — PROCHLORPERAZINE MALEATE 10 MG PO TABS
10.0000 mg | ORAL_TABLET | Freq: Once | ORAL | Status: AC
  Administered 2020-01-18: 10 mg via ORAL
  Filled 2020-01-18: qty 1

## 2020-01-18 MED ORDER — HEPARIN SOD (PORK) LOCK FLUSH 100 UNIT/ML IV SOLN
500.0000 [IU] | Freq: Once | INTRAVENOUS | Status: AC | PRN
  Administered 2020-01-18: 500 [IU]
  Filled 2020-01-18: qty 5

## 2020-01-18 MED ORDER — SODIUM CHLORIDE 0.9 % IV SOLN
1000.0000 mg | Freq: Once | INTRAVENOUS | Status: AC
  Administered 2020-01-18: 988.5932 mg via INTRAVENOUS
  Filled 2020-01-18: qty 26

## 2020-01-18 MED ORDER — SODIUM CHLORIDE 0.9 % IV SOLN
Freq: Once | INTRAVENOUS | Status: AC
  Filled 2020-01-18: qty 250

## 2020-01-18 MED ORDER — HEPARIN SOD (PORK) LOCK FLUSH 100 UNIT/ML IV SOLN
INTRAVENOUS | Status: AC
  Filled 2020-01-18: qty 5

## 2020-01-18 MED ORDER — SODIUM CHLORIDE 0.9% FLUSH
10.0000 mL | INTRAVENOUS | Status: DC | PRN
  Administered 2020-01-18: 10 mL via INTRAVENOUS
  Filled 2020-01-18: qty 10

## 2020-01-19 LAB — CA 125: Cancer Antigen (CA) 125: 163 U/mL — ABNORMAL HIGH (ref 0.0–38.1)

## 2020-01-25 ENCOUNTER — Inpatient Hospital Stay: Payer: Medicare PPO

## 2020-01-25 ENCOUNTER — Other Ambulatory Visit: Payer: Self-pay

## 2020-01-25 VITALS — BP 141/85 | HR 92 | Temp 97.5°F | Resp 18 | Wt 95.4 lb

## 2020-01-25 DIAGNOSIS — Z5111 Encounter for antineoplastic chemotherapy: Secondary | ICD-10-CM | POA: Diagnosis not present

## 2020-01-25 DIAGNOSIS — C569 Malignant neoplasm of unspecified ovary: Secondary | ICD-10-CM

## 2020-01-25 LAB — CBC WITH DIFFERENTIAL/PLATELET
Abs Immature Granulocytes: 0.04 10*3/uL (ref 0.00–0.07)
Basophils Absolute: 0 10*3/uL (ref 0.0–0.1)
Basophils Relative: 1 %
Eosinophils Absolute: 0 10*3/uL (ref 0.0–0.5)
Eosinophils Relative: 0 %
HCT: 27.7 % — ABNORMAL LOW (ref 36.0–46.0)
Hemoglobin: 9.1 g/dL — ABNORMAL LOW (ref 12.0–15.0)
Immature Granulocytes: 1 %
Lymphocytes Relative: 19 %
Lymphs Abs: 0.8 10*3/uL (ref 0.7–4.0)
MCH: 30.4 pg (ref 26.0–34.0)
MCHC: 32.9 g/dL (ref 30.0–36.0)
MCV: 92.6 fL (ref 80.0–100.0)
Monocytes Absolute: 0.4 10*3/uL (ref 0.1–1.0)
Monocytes Relative: 9 %
Neutro Abs: 3 10*3/uL (ref 1.7–7.7)
Neutrophils Relative %: 70 %
Platelets: 519 10*3/uL — ABNORMAL HIGH (ref 150–400)
RBC: 2.99 MIL/uL — ABNORMAL LOW (ref 3.87–5.11)
RDW: 14.7 % (ref 11.5–15.5)
WBC: 4.2 10*3/uL (ref 4.0–10.5)
nRBC: 0 % (ref 0.0–0.2)

## 2020-01-25 LAB — COMPREHENSIVE METABOLIC PANEL
ALT: 14 U/L (ref 0–44)
AST: 22 U/L (ref 15–41)
Albumin: 3.6 g/dL (ref 3.5–5.0)
Alkaline Phosphatase: 77 U/L (ref 38–126)
Anion gap: 10 (ref 5–15)
BUN: 29 mg/dL — ABNORMAL HIGH (ref 8–23)
CO2: 24 mmol/L (ref 22–32)
Calcium: 8.4 mg/dL — ABNORMAL LOW (ref 8.9–10.3)
Chloride: 96 mmol/L — ABNORMAL LOW (ref 98–111)
Creatinine, Ser: 0.91 mg/dL (ref 0.44–1.00)
GFR calc Af Amer: >60 mL/min (ref >60)
GFR calc non Af Amer: 56 mL/min — ABNORMAL LOW (ref >60)
Glucose, Bld: 102 mg/dL — ABNORMAL HIGH (ref 70–99)
Potassium: 4.6 mmol/L (ref 3.5–5.1)
Sodium: 130 mmol/L — ABNORMAL LOW (ref 135–145)
Total Bilirubin: 0.5 mg/dL (ref 0.3–1.2)
Total Protein: 6.9 g/dL (ref 6.5–8.1)

## 2020-01-25 MED ORDER — SODIUM CHLORIDE 0.9% FLUSH
10.0000 mL | Freq: Once | INTRAVENOUS | Status: AC
  Administered 2020-01-25: 10 mL via INTRAVENOUS
  Filled 2020-01-25: qty 10

## 2020-01-25 MED ORDER — SODIUM CHLORIDE 0.9 % IV SOLN
Freq: Once | INTRAVENOUS | Status: AC
  Filled 2020-01-25: qty 250

## 2020-01-25 MED ORDER — SODIUM CHLORIDE 0.9 % IV SOLN
1000.0000 mg | Freq: Once | INTRAVENOUS | Status: AC
  Administered 2020-01-25: 988.5932 mg via INTRAVENOUS
  Filled 2020-01-25: qty 26

## 2020-01-25 MED ORDER — HEPARIN SOD (PORK) LOCK FLUSH 100 UNIT/ML IV SOLN
500.0000 [IU] | Freq: Once | INTRAVENOUS | Status: DC | PRN
  Filled 2020-01-25: qty 5

## 2020-01-25 MED ORDER — HEPARIN SOD (PORK) LOCK FLUSH 100 UNIT/ML IV SOLN
500.0000 [IU] | Freq: Once | INTRAVENOUS | Status: AC
  Administered 2020-01-25: 500 [IU] via INTRAVENOUS
  Filled 2020-01-25: qty 5

## 2020-01-25 MED ORDER — PROCHLORPERAZINE MALEATE 10 MG PO TABS
10.0000 mg | ORAL_TABLET | Freq: Once | ORAL | Status: AC
  Administered 2020-01-25: 10 mg via ORAL
  Filled 2020-01-25: qty 1

## 2020-01-26 LAB — CA 125: Cancer Antigen (CA) 125: 183 U/mL — ABNORMAL HIGH (ref 0.0–38.1)

## 2020-01-29 NOTE — Progress Notes (Signed)
[place]  [phone] [phone]) [phone]  ID: Tamara Mckinney OB: [birth year]  MR#: [id number]  [id number]#:[id number]  Patient Care Team: [name], MD as PCP - General (Family Medicine) [name], RN as Registered Nurse Herbert Pun, MD as Consulting Physician (General Surgery) Lloyd Huger, MD as Consulting Physician (Oncology)  CHIEF COMPLAINT: Stage IIIC ovarian cancer.  INTERVAL HISTORY: Patient returns to clinic today for further evaluation and consideration of cycle 5, day 15 of single agent gemcitabine.  She has noticed significant increase in weakness and fatigue over the past several weeks. She continues to have occasional back pain and memory complaints.  She has occasional constipation and bright red blood per rectum.  She has no neurologic complaints.  She has a good appetite and her weight has remained stable.  She denies any chest pain, shortness of breath, cough, or hemoptysis.  She denies any nausea or vomiting.  She has no urinary complaints.  Patient offers no further specific complaints today.  REVIEW OF SYSTEMS:   Review of Systems  Constitutional: Positive for malaise/fatigue. Negative for fever and weight loss.  Respiratory: Negative.  Negative for cough and shortness of breath.   Cardiovascular: Negative.  Negative for chest pain and leg swelling.  Gastrointestinal: Positive for blood in stool. Negative for abdominal pain, constipation, diarrhea, heartburn, melena, nausea and vomiting.  Genitourinary: Negative.  Negative for dysuria.  Musculoskeletal: Positive for back pain. Negative for neck pain.  Skin: Negative.  Negative for itching and rash.  Neurological: Positive for weakness. Negative for dizziness and focal weakness.  Endo/Heme/Allergies: Does not bruise/bleed easily.  Psychiatric/Behavioral: Positive for memory loss. The patient is not nervous/anxious.     As per HPI. Otherwise, a complete review  of systems is negative.  PAST MEDICAL HISTORY: Past Medical History:  Diagnosis Date  . Anemia   . Arthritis   . Asthma   . Dyspnea   . GERD (gastroesophageal reflux disease)   . Hypertension   . Ovarian cancer ([place]) 2018    PAST SURGICAL HISTORY: Past Surgical History:  Procedure Laterality Date  . ABDOMINAL HYSTERECTOMY    . BREAST BIOPSY     x6  . BUNIONECTOMY Bilateral   . CATARACT EXTRACTION, BILATERAL    . FLEXIBLE SIGMOIDOSCOPY N/A 05/21/2017   Procedure: FLEXIBLE SIGMOIDOSCOPY;  Surgeon: Lin Landsman, MD;  Location: [facility] ENDOSCOPY;  Service: Gastroenterology;  Laterality: N/A;  . INCONTINENCE SURGERY    . PORTA CATH INSERTION N/A 06/16/2017   Procedure: PORTA CATH INSERTION;  Surgeon: Algernon Huxley, MD;  Location: [place] CV LAB;  Service: Cardiovascular;  Laterality: N/A;  . RECTAL SURGERY  04/2018  . REPAIR OF RECTAL PROLAPSE N/A 05/11/2017   Procedure: REPAIR OF RECTAL PROLAPSE;  Surgeon: Leonie Green, MD;  Location: [facility] ORS;  Service: General;  Laterality: N/A;  . TONSILLECTOMY    . VAGINA SURGERY     uncertain procedure performed    FAMILY HISTORY: Family History  Problem Relation Age of Onset  . Heart disease Mother   . Heart disease Father   . Heart disease Sister   . Heart attack Sister   . Ulcerative colitis Brother   . Lung cancer Brother   . Thyroid cancer Sister   . Asthma Sister   . Diabetes Sister   . Asthma Sister   . Pancreatic cancer Sister   . Dementia Sister   . Asthma Brother   . Heart disease Brother   . Asthma Brother   .  Lung cancer Brother   . Asthma Brother   . Lung cancer Brother   . Lung cancer Brother   . Rheum arthritis Brother     ADVANCED DIRECTIVES (Y/N):  N  HEALTH MAINTENANCE: Social History   Tobacco Use  . Smoking status: Never Smoker  . Smokeless tobacco: Never Used  Vaping Use  . Vaping Use: Never used  Substance Use Topics  . Alcohol use: No  . Drug use: No      Colonoscopy:  PAP:  Bone density:  Lipid panel:  No Known Allergies  Current Outpatient Medications  Medication Sig Dispense Refill  . acetaminophen (TYLENOL) 500 MG tablet Take 500 mg by mouth every 6 (six) hours as needed.    . cyclobenzaprine (FLEXERIL) 10 MG tablet TAKE ONE TABLET BY MOUTH THREE TIMES A DAY AS NEEDED 30 tablet 0  . esomeprazole (NEXIUM) 40 MG capsule Take 40 mg by mouth daily before breakfast.     . lidocaine-prilocaine (EMLA) cream APPLY A SMALL AMOUNT TO PORT SITE AT LEAST 1 HOUR PRIOR TO IT BEING ACCESSED THEN COVER WITH PLASTIC WRAP 30 g 0  . montelukast (SINGULAIR) 10 MG tablet Take 10 mg by mouth at bedtime.    . ondansetron (ZOFRAN) 8 MG tablet Take 1 tablet (8 mg total) by mouth 2 (two) times daily as needed for nausea or vomiting. 30 tablet 2  . polyethylene glycol (MIRALAX / GLYCOLAX) packet Take 17 g by mouth daily as needed.     . traMADol (ULTRAM) 50 MG tablet Take 1 tablet (50 mg total) by mouth every 6 (six) hours as needed. 30 tablet 0  . valsartan (DIOVAN) 160 MG tablet Start 1/2 tab a day.  Can increase to 1 tab after a few weeks if BP stays high     No current facility-administered medications for this visit.   Facility-Administered Medications Ordered in Other Visits  Medication Dose Route Frequency Provider Last Rate Last Admin  . sodium chloride flush (NS) 0.9 % injection 10 mL  10 mL Intravenous PRN Lloyd Huger, MD   10 mL at 03/10/18 1200    OBJECTIVE: Vitals:   02/01/20 1035  BP: (!) 160/87  Pulse: 96  Resp: 20  Temp: 98.1 F (36.7 C)     Body mass index is 20.41 kg/m.    ECOG FS:0 - Asymptomatic  General: Thin, no acute distress. Eyes: Pink conjunctiva, anicteric sclera. HEENT: Normocephalic, moist mucous membranes. Lungs: No audible wheezing or coughing. Heart: Regular rate and rhythm. Abdomen: Soft, nontender, no obvious distention. Musculoskeletal: No edema, cyanosis, or clubbing. Neuro: Alert, answering all  questions appropriately. Cranial nerves grossly intact. Skin: No rashes or petechiae noted. Psych: Normal affect.   LAB RESULTS:  Lab Results  Component Value Date   NA 132 (L) 02/01/2020   K 4.0 02/01/2020   CL 98 02/01/2020   CO2 24 02/01/2020   GLUCOSE 137 (H) 02/01/2020   BUN 22 02/01/2020   CREATININE 0.82 02/01/2020   CALCIUM 8.4 (L) 02/01/2020   PROT 6.4 (L) 02/01/2020   ALBUMIN 3.4 (L) 02/01/2020   AST 22 02/01/2020   ALT 16 02/01/2020   ALKPHOS 79 02/01/2020   BILITOT 0.5 02/01/2020   GFRNONAA >60 02/01/2020   GFRAA >60 02/01/2020    Lab Results  Component Value Date   WBC 2.8 (L) 02/01/2020   NEUTROABS 2.0 02/01/2020   HGB 8.4 (L) 02/01/2020   HCT 25.7 (L) 02/01/2020   MCV 93.1 02/01/2020   PLT  161 02/01/2020     STUDIES: No results found.  ASSESSMENT: Stage IIIC ovarian cancer.  PLAN:    1. Stage IIIC ovarian cancer: CT scan results from November 14, 2019 reviewed independently with disease progression with enlargement of omental and peritoneal implants.  She also has new lesions within the right pelvis and hepatic lobe.  Her CA-125 was initially elevated to 326, but now has trended down to 130.0.  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 Plan to give treatment on days 1, 8, and 15 with a 22 off.  Proceed with cycle 5, day 15 of treatment today.  Return to clinic in 2 weeks for further evaluation and consideration of cycle 6, day 1.  2. Lupus anticoagulant: Patient was noted to have an elevated PTT as well as increased bleeding during her surgery for rectal prolapse.   3. Anemia: Hemoglobin has trended down to 8.4.  Previously, iron stores are within normal limits.  4.  Constipation: Patient does not complain of this today.  Continue OTC remedies as needed. 5.  Neutropenia: Mild, monitor.  Proceed with treatment as above. 6.  Hyponatremia: Chronic and unchanged.  Sodium is 132 today. 7.  Back/abdominal pain:  Continue tramadol as needed. 8.  Rectal bleeding: Likely secondary to hemorrhoids, monitor.  Patient also has a history of rectal prolapse and can consider referral back to surgery if necessary. 9.  Weakness and fatigue: Patient expressed interest in possibly delaying chemotherapy, but agreed to pursue treatment today and will readdress at next clinic visit.   Patient expressed understanding and was in agreement with this plan. She also understands that She can call clinic at any time with any questions, concerns, or complaints.   Cancer Staging Malignant neoplasm of ovary [facility]) Staging form: Ovary, Fallopian Tube, and Primary Peritoneal Carcinoma, AJCC 8th Edition - Clinical stage from 05/29/2017: Stage IIIC (cT3c, cN1b, cM0) - Signed by Lloyd Huger, MD on 05/29/2017   Lloyd Huger, MD   02/02/2020 6:46 AM

## 2020-01-31 ENCOUNTER — Encounter: Payer: Self-pay | Admitting: Oncology

## 2020-01-31 NOTE — Progress Notes (Signed)
Patient called for pre assessment today. States she has been having some pain that comes and goes in lower left abdomen (ovary area). She states she feels some "tightness" or swelling. She also reports feeling very itchy all over. States she has felt this way for a sometime but forgets to mention at her appointment. She would like to know if this could be coming from her treatment. She reports no other concerns at this time.

## 2020-02-01 ENCOUNTER — Other Ambulatory Visit: Payer: Self-pay

## 2020-02-01 ENCOUNTER — Inpatient Hospital Stay: Payer: Medicare PPO

## 2020-02-01 ENCOUNTER — Inpatient Hospital Stay (HOSPITAL_BASED_OUTPATIENT_CLINIC_OR_DEPARTMENT_OTHER): Payer: Medicare PPO | Admitting: Oncology

## 2020-02-01 VITALS — BP 160/87 | HR 96 | Temp 98.1°F | Resp 20 | Wt 96.0 lb

## 2020-02-01 DIAGNOSIS — C569 Malignant neoplasm of unspecified ovary: Secondary | ICD-10-CM

## 2020-02-01 DIAGNOSIS — Z5111 Encounter for antineoplastic chemotherapy: Secondary | ICD-10-CM | POA: Diagnosis not present

## 2020-02-01 LAB — CBC WITH DIFFERENTIAL/PLATELET
Abs Immature Granulocytes: 0.02 10*3/uL (ref 0.00–0.07)
Basophils Absolute: 0 10*3/uL (ref 0.0–0.1)
Basophils Relative: 0 %
Eosinophils Absolute: 0 10*3/uL (ref 0.0–0.5)
Eosinophils Relative: 0 %
HCT: 25.7 % — ABNORMAL LOW (ref 36.0–46.0)
Hemoglobin: 8.4 g/dL — ABNORMAL LOW (ref 12.0–15.0)
Immature Granulocytes: 1 %
Lymphocytes Relative: 19 %
Lymphs Abs: 0.5 10*3/uL — ABNORMAL LOW (ref 0.7–4.0)
MCH: 30.4 pg (ref 26.0–34.0)
MCHC: 32.7 g/dL (ref 30.0–36.0)
MCV: 93.1 fL (ref 80.0–100.0)
Monocytes Absolute: 0.3 10*3/uL (ref 0.1–1.0)
Monocytes Relative: 9 %
Neutro Abs: 2 10*3/uL (ref 1.7–7.7)
Neutrophils Relative %: 71 %
Platelets: 161 10*3/uL (ref 150–400)
RBC: 2.76 MIL/uL — ABNORMAL LOW (ref 3.87–5.11)
RDW: 15.2 % (ref 11.5–15.5)
WBC: 2.8 10*3/uL — ABNORMAL LOW (ref 4.0–10.5)
nRBC: 0 % (ref 0.0–0.2)

## 2020-02-01 LAB — COMPREHENSIVE METABOLIC PANEL
ALT: 16 U/L (ref 0–44)
AST: 22 U/L (ref 15–41)
Albumin: 3.4 g/dL — ABNORMAL LOW (ref 3.5–5.0)
Alkaline Phosphatase: 79 U/L (ref 38–126)
Anion gap: 10 (ref 5–15)
BUN: 22 mg/dL (ref 8–23)
CO2: 24 mmol/L (ref 22–32)
Calcium: 8.4 mg/dL — ABNORMAL LOW (ref 8.9–10.3)
Chloride: 98 mmol/L (ref 98–111)
Creatinine, Ser: 0.82 mg/dL (ref 0.44–1.00)
GFR calc Af Amer: >60 mL/min (ref >60)
GFR calc non Af Amer: >60 mL/min (ref >60)
Glucose, Bld: 137 mg/dL — ABNORMAL HIGH (ref 70–99)
Potassium: 4 mmol/L (ref 3.5–5.1)
Sodium: 132 mmol/L — ABNORMAL LOW (ref 135–145)
Total Bilirubin: 0.5 mg/dL (ref 0.3–1.2)
Total Protein: 6.4 g/dL — ABNORMAL LOW (ref 6.5–8.1)

## 2020-02-01 MED ORDER — SODIUM CHLORIDE 0.9 % IV SOLN
Freq: Once | INTRAVENOUS | Status: AC
  Filled 2020-02-01: qty 250

## 2020-02-01 MED ORDER — PROCHLORPERAZINE MALEATE 10 MG PO TABS
10.0000 mg | ORAL_TABLET | Freq: Once | ORAL | Status: AC
  Administered 2020-02-01: 10 mg via ORAL
  Filled 2020-02-01: qty 1

## 2020-02-01 MED ORDER — SODIUM CHLORIDE 0.9 % IV SOLN
1000.0000 mg | Freq: Once | INTRAVENOUS | Status: AC
  Administered 2020-02-01: 988.5932 mg via INTRAVENOUS
  Filled 2020-02-01: qty 26

## 2020-02-01 MED ORDER — HEPARIN SOD (PORK) LOCK FLUSH 100 UNIT/ML IV SOLN
500.0000 [IU] | Freq: Once | INTRAVENOUS | Status: AC | PRN
  Administered 2020-02-01: 500 [IU]
  Filled 2020-02-01: qty 5

## 2020-02-01 NOTE — Progress Notes (Signed)
Pt here for follow up. BP elevated, pt reports that she forgot to take her BP medication this morning. Pt in NAD, denies HA, CP, dizziness, blurred vision. MD notified of BP. No orders.

## 2020-02-01 NOTE — Progress Notes (Signed)
B/p 160/87 and WBC 2.8, Per Dr. Grayland Ormond okay to proceed with treatment. Pt reports appetite loss, MD aware, NNO at this time.

## 2020-02-02 LAB — CA 125: Cancer Antigen (CA) 125: 130 U/mL — ABNORMAL HIGH (ref 0.0–38.1)

## 2020-02-08 NOTE — Progress Notes (Signed)
Tamara Mckinney  [phone] [phone]) [phone]  ID: Tamara Mckinney OB: Dec 09, 1927  MR#: [id number]  KZS#:[id number]  Patient Care Team: [name], MD as PCP - General (Family Medicine) [name], RN as Registered Nurse Herbert Pun, MD as Consulting Physician (General Surgery) Lloyd Huger, MD as Consulting Physician (Oncology)  CHIEF COMPLAINT: Stage IIIC ovarian cancer.  INTERVAL HISTORY: Patient returns to clinic today for further evaluation and consideration of cycle 6, day 1 of single agent gemcitabine.  She continues to have chronic weakness and fatigue, but states this is mildly improved. She continues to have occasional back pain and memory complaints.  She has occasional constipation and bright red blood per rectum.  She has no neurologic complaints.  She has a good appetite and her weight has remained stable.  She denies any chest pain, shortness of breath, cough, or hemoptysis.  She denies any nausea or vomiting.  She has no urinary complaints.  Patient offers no further specific complaints today.  REVIEW OF SYSTEMS:   Review of Systems  Constitutional: Positive for malaise/fatigue. Negative for fever and weight loss.  Respiratory: Negative.  Negative for cough and shortness of breath.   Cardiovascular: Negative.  Negative for chest pain and leg swelling.  Gastrointestinal: Positive for blood in stool. Negative for abdominal pain, constipation, diarrhea, heartburn, melena, nausea and vomiting.  Genitourinary: Negative.  Negative for dysuria.  Musculoskeletal: Positive for back pain. Negative for neck pain.  Skin: Negative.  Negative for itching and rash.  Neurological: Positive for weakness. Negative for dizziness and focal weakness.  Endo/Heme/Allergies: Does not bruise/bleed easily.  Psychiatric/Behavioral: Positive for memory loss. The patient is not nervous/anxious.     As per HPI. Otherwise, a complete review of  systems is negative.  PAST MEDICAL HISTORY: Past Medical History:  Diagnosis Date  . Anemia   . Arthritis   . Asthma   . Dyspnea   . GERD (gastroesophageal reflux disease)   . Hypertension   . Ovarian cancer ([place]) 2018    PAST SURGICAL HISTORY: Past Surgical History:  Procedure Laterality Date  . ABDOMINAL HYSTERECTOMY    . BREAST BIOPSY     x6  . BUNIONECTOMY Bilateral   . CATARACT EXTRACTION, BILATERAL    . FLEXIBLE SIGMOIDOSCOPY N/A 05/21/2017   Procedure: FLEXIBLE SIGMOIDOSCOPY;  Surgeon: Lin Landsman, MD;  Location: [facility] ENDOSCOPY;  Service: Gastroenterology;  Laterality: N/A;  . INCONTINENCE SURGERY    . PORTA CATH INSERTION N/A 06/16/2017   Procedure: PORTA CATH INSERTION;  Surgeon: Algernon Huxley, MD;  Location: [place] [facility];  Service: Cardiovascular;  Laterality: N/A;  . RECTAL SURGERY  04/2018  . REPAIR OF RECTAL PROLAPSE N/A 05/11/2017   Procedure: REPAIR OF RECTAL PROLAPSE;  Surgeon: Leonie Green, MD;  Location: [facility] ORS;  Service: General;  Laterality: N/A;  . TONSILLECTOMY    . VAGINA SURGERY     uncertain procedure performed    FAMILY HISTORY: Family History  Problem Relation Age of Onset  . Heart disease Mother   . Heart disease Father   . Heart disease Sister   . Heart attack Sister   . Ulcerative colitis Brother   . Lung cancer Brother   . Thyroid cancer Sister   . Asthma Sister   . Diabetes Sister   . Asthma Sister   . Pancreatic cancer Sister   . Dementia Sister   . Asthma Brother   . Heart disease Brother   . Asthma Brother   .  Lung cancer Brother   . Asthma Brother   . Lung cancer Brother   . Lung cancer Brother   . Rheum arthritis Brother     ADVANCED DIRECTIVES (Y/N):  N  HEALTH MAINTENANCE: Social History   Tobacco Use  . Smoking status: Never Smoker  . Smokeless tobacco: Never Used  Vaping Use  . Vaping Use: Never used  Substance Use Topics  . Alcohol use: No  . Drug use: No      Colonoscopy:  PAP:  Bone density:  Lipid panel:  No Known Allergies  Current Outpatient Medications  Medication Sig Dispense Refill  . acetaminophen (TYLENOL) 500 MG tablet Take 500 mg by mouth every 6 (six) hours as needed.    . cyclobenzaprine (FLEXERIL) 10 MG tablet TAKE ONE TABLET BY MOUTH THREE TIMES A DAY AS NEEDED 30 tablet 0  . esomeprazole (NEXIUM) 40 MG capsule Take 40 mg by mouth daily before breakfast.     . lidocaine-prilocaine (EMLA) cream APPLY A SMALL AMOUNT TO PORT SITE AT LEAST 1 HOUR PRIOR TO IT BEING ACCESSED THEN COVER WITH PLASTIC WRAP 30 g 0  . montelukast (SINGULAIR) 10 MG tablet Take 10 mg by mouth at bedtime.    . ondansetron (ZOFRAN) 8 MG tablet Take 1 tablet (8 mg total) by mouth 2 (two) times daily as needed for nausea or vomiting. 30 tablet 2  . polyethylene glycol (MIRALAX / GLYCOLAX) packet Take 17 g by mouth daily as needed.     . traMADol (ULTRAM) 50 MG tablet Take 1 tablet (50 mg total) by mouth every 6 (six) hours as needed. 30 tablet 0  . valsartan (DIOVAN) 160 MG tablet Start 1/2 tab a day.  Can increase to 1 tab after a few weeks if BP stays high     No current facility-administered medications for this visit.   Facility-Administered Medications Ordered in Other Visits  Medication Dose Route Frequency Provider Last Rate Last Admin  . sodium chloride flush (NS) 0.9 % injection 10 mL  10 mL Intravenous PRN Lloyd Huger, MD   10 mL at 03/10/18 1200    OBJECTIVE: Vitals:   02/15/20 1018  BP: (!) 150/82  Pulse: (!) 109  Temp: 98.9 F (37.2 C)  SpO2: 100%     Body mass index is 20.44 kg/m.    ECOG FS:0 - Asymptomatic  General: Thin, no acute distress. Eyes: Pink conjunctiva, anicteric sclera. HEENT: Normocephalic, moist mucous membranes. Lungs: No audible wheezing or coughing. Heart: Regular rate and rhythm. Abdomen: Soft, nontender, no obvious distention. Musculoskeletal: No edema, cyanosis, or clubbing. Neuro: Alert,  answering all questions appropriately. Cranial nerves grossly intact. Skin: No rashes or petechiae noted. Psych: Normal affect.   LAB RESULTS:  Lab Results  Component Value Date   NA 130 (L) 02/15/2020   K 4.5 02/15/2020   CL 98 02/15/2020   CO2 24 02/15/2020   GLUCOSE 162 (H) 02/15/2020   BUN 27 (H) 02/15/2020   CREATININE 0.97 02/15/2020   CALCIUM 8.3 (L) 02/15/2020   PROT 6.6 02/15/2020   ALBUMIN 3.4 (L) 02/15/2020   AST 22 02/15/2020   ALT 12 02/15/2020   ALKPHOS 75 02/15/2020   BILITOT 0.5 02/15/2020   GFRNONAA 51 (L) 02/15/2020   GFRAA 60 (L) 02/15/2020    Lab Results  Component Value Date   WBC 8.9 02/15/2020   NEUTROABS 6.9 02/15/2020   HGB 8.9 (L) 02/15/2020   HCT 27.8 (L) 02/15/2020   MCV 95.5 02/15/2020  PLT 540 (H) 02/15/2020     STUDIES: No results found.  ASSESSMENT: Stage IIIC ovarian cancer.  PLAN:    1. Stage IIIC ovarian cancer: CT scan results from November 14, 2019 reviewed independently with disease progression with enlargement of omental and peritoneal implants.  She also has new lesions within the right pelvis and hepatic lobe.  Her CA-125 was initially elevated to 326 and initially trended down to 130, but now has increased to 224. 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 Plan to give treatment on days 1, 8, and 15 with a 22 off.  Proceed with cycle 6, day 1 of treatment today.  Return to clinic in 1 week for further evaluation and consideration of cycle 6, day 8.  Given patient's persistent weakness and fatigue it was decided upon that she will skip cycle 6, day 15 and have several weeks off.  2. Lupus anticoagulant: Patient was noted to have an elevated PTT as well as increased bleeding during her surgery for rectal prolapse.   3. Anemia: Chronic and unchanged.  Patient's hemoglobin is 8.9 today.  Previously, iron stores are within normal limits.  4.  Constipation: Patient does not complain of this  today.  Continue OTC remedies as needed. 5.  Neutropenia: Resolved.  Proceed with treatment as above. 6.  Hyponatremia: Chronic and unchanged.  Sodium is 130 today. 7.  Back/abdominal pain: Continue tramadol as needed. 8.  Rectal bleeding: Likely secondary to hemorrhoids, monitor.  Patient also has a history of rectal prolapse and can consider referral back to surgery if necessary. 9.  Weakness and fatigue: Proceed with treatment today and will not give day 15 of treatment for this cycle.   Patient expressed understanding and was in agreement with this plan. She also understands that She can call clinic at any time with any questions, concerns, or complaints.   Cancer Staging Malignant neoplasm of ovary [facility]) Staging form: Ovary, Fallopian Tube, and Primary Peritoneal Carcinoma, AJCC 8th Edition - Clinical stage from 05/29/2017: Stage IIIC (cT3c, cN1b, cM0) - Signed by Lloyd Huger, MD on 05/29/2017   Lloyd Huger, MD   02/16/2020 6:52 AM

## 2020-02-15 ENCOUNTER — Inpatient Hospital Stay: Payer: Medicare PPO | Attending: Oncology

## 2020-02-15 ENCOUNTER — Inpatient Hospital Stay (HOSPITAL_BASED_OUTPATIENT_CLINIC_OR_DEPARTMENT_OTHER): Payer: Medicare PPO | Admitting: Oncology

## 2020-02-15 ENCOUNTER — Other Ambulatory Visit: Payer: Self-pay

## 2020-02-15 ENCOUNTER — Encounter: Payer: Self-pay | Admitting: Oncology

## 2020-02-15 ENCOUNTER — Inpatient Hospital Stay: Payer: Medicare PPO

## 2020-02-15 VITALS — BP 150/82 | HR 109 | Temp 98.9°F | Wt 96.1 lb

## 2020-02-15 VITALS — BP 154/81 | HR 98 | Resp 18

## 2020-02-15 DIAGNOSIS — I10 Essential (primary) hypertension: Secondary | ICD-10-CM | POA: Insufficient documentation

## 2020-02-15 DIAGNOSIS — D649 Anemia, unspecified: Secondary | ICD-10-CM | POA: Diagnosis not present

## 2020-02-15 DIAGNOSIS — C569 Malignant neoplasm of unspecified ovary: Secondary | ICD-10-CM

## 2020-02-15 DIAGNOSIS — R5381 Other malaise: Secondary | ICD-10-CM | POA: Insufficient documentation

## 2020-02-15 DIAGNOSIS — Z7901 Long term (current) use of anticoagulants: Secondary | ICD-10-CM | POA: Insufficient documentation

## 2020-02-15 DIAGNOSIS — K219 Gastro-esophageal reflux disease without esophagitis: Secondary | ICD-10-CM | POA: Insufficient documentation

## 2020-02-15 DIAGNOSIS — E871 Hypo-osmolality and hyponatremia: Secondary | ICD-10-CM | POA: Insufficient documentation

## 2020-02-15 DIAGNOSIS — Z800 Family history of malignant neoplasm of digestive organs: Secondary | ICD-10-CM | POA: Diagnosis not present

## 2020-02-15 DIAGNOSIS — R791 Abnormal coagulation profile: Secondary | ICD-10-CM | POA: Insufficient documentation

## 2020-02-15 DIAGNOSIS — Z79899 Other long term (current) drug therapy: Secondary | ICD-10-CM | POA: Diagnosis not present

## 2020-02-15 DIAGNOSIS — R531 Weakness: Secondary | ICD-10-CM | POA: Insufficient documentation

## 2020-02-15 DIAGNOSIS — K625 Hemorrhage of anus and rectum: Secondary | ICD-10-CM | POA: Insufficient documentation

## 2020-02-15 DIAGNOSIS — Z801 Family history of malignant neoplasm of trachea, bronchus and lung: Secondary | ICD-10-CM | POA: Diagnosis not present

## 2020-02-15 DIAGNOSIS — M549 Dorsalgia, unspecified: Secondary | ICD-10-CM | POA: Diagnosis not present

## 2020-02-15 DIAGNOSIS — M1990 Unspecified osteoarthritis, unspecified site: Secondary | ICD-10-CM | POA: Diagnosis not present

## 2020-02-15 DIAGNOSIS — Z5111 Encounter for antineoplastic chemotherapy: Secondary | ICD-10-CM | POA: Diagnosis present

## 2020-02-15 DIAGNOSIS — K5900 Constipation, unspecified: Secondary | ICD-10-CM | POA: Insufficient documentation

## 2020-02-15 DIAGNOSIS — R5383 Other fatigue: Secondary | ICD-10-CM | POA: Diagnosis not present

## 2020-02-15 LAB — COMPREHENSIVE METABOLIC PANEL
ALT: 12 U/L (ref 0–44)
AST: 22 U/L (ref 15–41)
Albumin: 3.4 g/dL — ABNORMAL LOW (ref 3.5–5.0)
Alkaline Phosphatase: 75 U/L (ref 38–126)
Anion gap: 8 (ref 5–15)
BUN: 27 mg/dL — ABNORMAL HIGH (ref 8–23)
CO2: 24 mmol/L (ref 22–32)
Calcium: 8.3 mg/dL — ABNORMAL LOW (ref 8.9–10.3)
Chloride: 98 mmol/L (ref 98–111)
Creatinine, Ser: 0.97 mg/dL (ref 0.44–1.00)
GFR calc Af Amer: 60 mL/min — ABNORMAL LOW (ref >60)
GFR calc non Af Amer: 51 mL/min — ABNORMAL LOW (ref >60)
Glucose, Bld: 162 mg/dL — ABNORMAL HIGH (ref 70–99)
Potassium: 4.5 mmol/L (ref 3.5–5.1)
Sodium: 130 mmol/L — ABNORMAL LOW (ref 135–145)
Total Bilirubin: 0.5 mg/dL (ref 0.3–1.2)
Total Protein: 6.6 g/dL (ref 6.5–8.1)

## 2020-02-15 LAB — CBC WITH DIFFERENTIAL/PLATELET
Abs Immature Granulocytes: 0.08 10*3/uL — ABNORMAL HIGH (ref 0.00–0.07)
Basophils Absolute: 0 10*3/uL (ref 0.0–0.1)
Basophils Relative: 0 %
Eosinophils Absolute: 0.1 10*3/uL (ref 0.0–0.5)
Eosinophils Relative: 2 %
HCT: 27.8 % — ABNORMAL LOW (ref 36.0–46.0)
Hemoglobin: 8.9 g/dL — ABNORMAL LOW (ref 12.0–15.0)
Immature Granulocytes: 1 %
Lymphocytes Relative: 8 %
Lymphs Abs: 0.7 10*3/uL (ref 0.7–4.0)
MCH: 30.6 pg (ref 26.0–34.0)
MCHC: 32 g/dL (ref 30.0–36.0)
MCV: 95.5 fL (ref 80.0–100.0)
Monocytes Absolute: 1 10*3/uL (ref 0.1–1.0)
Monocytes Relative: 12 %
Neutro Abs: 6.9 10*3/uL (ref 1.7–7.7)
Neutrophils Relative %: 77 %
Platelets: 540 10*3/uL — ABNORMAL HIGH (ref 150–400)
RBC: 2.91 MIL/uL — ABNORMAL LOW (ref 3.87–5.11)
RDW: 17 % — ABNORMAL HIGH (ref 11.5–15.5)
WBC: 8.9 10*3/uL (ref 4.0–10.5)
nRBC: 0 % (ref 0.0–0.2)

## 2020-02-15 MED ORDER — HEPARIN SOD (PORK) LOCK FLUSH 100 UNIT/ML IV SOLN
INTRAVENOUS | Status: AC
  Filled 2020-02-15: qty 5

## 2020-02-15 MED ORDER — SODIUM CHLORIDE 0.9 % IV SOLN
1000.0000 mg | Freq: Once | INTRAVENOUS | Status: AC
  Administered 2020-02-15: 988.5932 mg via INTRAVENOUS
  Filled 2020-02-15: qty 26

## 2020-02-15 MED ORDER — PROCHLORPERAZINE MALEATE 10 MG PO TABS
10.0000 mg | ORAL_TABLET | Freq: Once | ORAL | Status: AC
  Administered 2020-02-15: 10 mg via ORAL
  Filled 2020-02-15: qty 1

## 2020-02-15 MED ORDER — HEPARIN SOD (PORK) LOCK FLUSH 100 UNIT/ML IV SOLN
500.0000 [IU] | Freq: Once | INTRAVENOUS | Status: AC
  Administered 2020-02-15: 500 [IU] via INTRAVENOUS
  Filled 2020-02-15: qty 5

## 2020-02-15 MED ORDER — SODIUM CHLORIDE 0.9% FLUSH
10.0000 mL | INTRAVENOUS | Status: DC | PRN
  Administered 2020-02-15: 10 mL via INTRAVENOUS
  Filled 2020-02-15: qty 10

## 2020-02-15 MED ORDER — SODIUM CHLORIDE 0.9 % IV SOLN
Freq: Once | INTRAVENOUS | Status: AC
  Filled 2020-02-15: qty 250

## 2020-02-15 NOTE — Progress Notes (Signed)
Patient reports no pain or concerns at today's follow up.

## 2020-02-16 LAB — CA 125: Cancer Antigen (CA) 125: 224 U/mL — ABNORMAL HIGH (ref 0.0–38.1)

## 2020-02-18 NOTE — Progress Notes (Signed)
[name]  [phone]) [phone] [phone]) [phone]  ID: Tamara Mckinney OB: September 13, 1927  MR#: [id number]  UQJ#:[id number]  Patient Care Team: [name], MD as PCP - General (Family Medicine) [name], RN as Registered Nurse Herbert Pun, MD as Consulting Physician (General Surgery) Lloyd Huger, MD as Consulting Physician (Oncology)  CHIEF COMPLAINT: Stage IIIC ovarian cancer.  INTERVAL HISTORY: Patient returns to clinic today for further evaluation and consideration of cycle 6, day one of single agent gemcitabine.  She continues to have chronic weakness and fatigue, but otherwise feels well.  She has occasional back pain and memory complaints. She has no neurologic complaints.  She has a good appetite and her weight has remained stable.  She denies any chest pain, shortness of breath, cough, or hemoptysis.  She denies any nausea, vomiting, or diarrhea.  She has no urinary complaints.  Patient offers no further specific complaints today.  REVIEW OF SYSTEMS:   Review of Systems  Constitutional: Positive for malaise/fatigue. Negative for fever and weight loss.  Respiratory: Negative.  Negative for cough and shortness of breath.   Cardiovascular: Negative.  Negative for chest pain and leg swelling.  Gastrointestinal: Positive for blood in stool and constipation. Negative for abdominal pain, diarrhea, heartburn, melena, nausea and vomiting.  Genitourinary: Negative.  Negative for dysuria.  Musculoskeletal: Positive for back pain. Negative for neck pain.  Skin: Negative.  Negative for itching and rash.  Neurological: Positive for weakness. Negative for dizziness and focal weakness.  Endo/Heme/Allergies: Does not bruise/bleed easily.  Psychiatric/Behavioral: Positive for memory loss. The patient is not nervous/anxious.     As per HPI. Otherwise, a complete review of systems is negative.  PAST MEDICAL HISTORY: Past Medical History:    Diagnosis Date  . Anemia   . Arthritis   . Asthma   . Dyspnea   . GERD (gastroesophageal reflux disease)   . Hypertension   . Ovarian cancer ([place]) 2018    PAST SURGICAL HISTORY: Past Surgical History:  Procedure Laterality Date  . ABDOMINAL HYSTERECTOMY    . BREAST BIOPSY     x6  . BUNIONECTOMY Bilateral   . CATARACT EXTRACTION, BILATERAL    . FLEXIBLE SIGMOIDOSCOPY N/A 05/21/2017   Procedure: FLEXIBLE SIGMOIDOSCOPY;  Surgeon: Lin Landsman, MD;  Location: [facility] ENDOSCOPY;  Service: Gastroenterology;  Laterality: N/A;  . INCONTINENCE SURGERY    . PORTA CATH INSERTION N/A 06/16/2017   Procedure: PORTA CATH INSERTION;  Surgeon: Algernon Huxley, MD;  Location: [place] [facility];  Service: Cardiovascular;  Laterality: N/A;  . RECTAL SURGERY  04/2018  . REPAIR OF RECTAL PROLAPSE N/A 05/11/2017   Procedure: REPAIR OF RECTAL PROLAPSE;  Surgeon: Leonie Green, MD;  Location: [facility] ORS;  Service: General;  Laterality: N/A;  . TONSILLECTOMY    . VAGINA SURGERY     uncertain procedure performed    FAMILY HISTORY: Family History  Problem Relation Age of Onset  . Heart disease Mother   . Heart disease Father   . Heart disease Sister   . Heart attack Sister   . Ulcerative colitis Brother   . Lung cancer Brother   . Thyroid cancer Sister   . Asthma Sister   . Diabetes Sister   . Asthma Sister   . Pancreatic cancer Sister   . Dementia Sister   . Asthma Brother   . Heart disease Brother   . Asthma Brother   . Lung cancer Brother   . Asthma Brother   .  Lung cancer Brother   . Lung cancer Brother   . Rheum arthritis Brother     ADVANCED DIRECTIVES (Y/N):  N  HEALTH MAINTENANCE: Social History   Tobacco Use  . Smoking status: Never Smoker  . Smokeless tobacco: Never Used  Vaping Use  . Vaping Use: Never used  Substance Use Topics  . Alcohol use: No  . Drug use: No     Colonoscopy:  PAP:  Bone density:  Lipid panel:  No Known Allergies  Current  Outpatient Medications  Medication Sig Dispense Refill  . acetaminophen (TYLENOL) 500 MG tablet Take 500 mg by mouth every 6 (six) hours as needed.    . cyclobenzaprine (FLEXERIL) 10 MG tablet TAKE ONE TABLET BY MOUTH THREE TIMES A DAY AS NEEDED 30 tablet 0  . esomeprazole (NEXIUM) 40 MG capsule Take 40 mg by mouth daily before breakfast.     . lidocaine-prilocaine (EMLA) cream APPLY A SMALL AMOUNT TO PORT SITE AT LEAST 1 HOUR PRIOR TO IT BEING ACCESSED THEN COVER WITH PLASTIC WRAP 30 g 0  . montelukast (SINGULAIR) 10 MG tablet Take 10 mg by mouth at bedtime.    . ondansetron (ZOFRAN) 8 MG tablet Take 1 tablet (8 mg total) by mouth 2 (two) times daily as needed for nausea or vomiting. 30 tablet 2  . polyethylene glycol (MIRALAX / GLYCOLAX) packet Take 17 g by mouth daily as needed.     . traMADol (ULTRAM) 50 MG tablet Take 1 tablet (50 mg total) by mouth every 6 (six) hours as needed. 30 tablet 0  . valsartan (DIOVAN) 160 MG tablet Start 1/2 tab a day.  Can increase to 1 tab after a few weeks if BP stays high     No current facility-administered medications for this visit.   Facility-Administered Medications Ordered in Other Visits  Medication Dose Route Frequency Provider Last Rate Last Admin  . sodium chloride flush (NS) 0.9 % injection 10 mL  10 mL Intravenous PRN Lloyd Huger, MD   10 mL at 03/10/18 1200    OBJECTIVE: Vitals:   02/21/20 1352  BP: 135/82  Pulse: (!) 101  Resp: 20  Temp: 97.6 F (36.4 C)  SpO2: 100%     Body mass index is 20.69 kg/m.    ECOG FS:0 - Asymptomatic  General: Thin, no acute distress. Eyes: Pink conjunctiva, anicteric sclera. HEENT: Normocephalic, moist mucous membranes. Lungs: No audible wheezing or coughing. Heart: Regular rate and rhythm. Abdomen: Soft, nontender, no obvious distention. Musculoskeletal: No edema, cyanosis, or clubbing. Neuro: Alert, answering all questions appropriately. Cranial nerves grossly intact. Skin: No rashes or  petechiae noted. Psych: Normal affect.   LAB RESULTS:  Lab Results  Component Value Date   NA 131 (L) 02/22/2020   K 4.6 02/22/2020   CL 99 02/22/2020   CO2 23 02/22/2020   GLUCOSE 151 (H) 02/22/2020   BUN 21 02/22/2020   CREATININE 0.83 02/22/2020   CALCIUM 8.5 (L) 02/22/2020   PROT 6.6 02/22/2020   ALBUMIN 3.5 02/22/2020   AST 25 02/22/2020   ALT 13 02/22/2020   ALKPHOS 75 02/22/2020   BILITOT 0.4 02/22/2020   GFRNONAA >60 02/22/2020   GFRAA >60 02/22/2020    Lab Results  Component Value Date   WBC 4.2 02/22/2020   NEUTROABS 3.0 02/22/2020   HGB 8.6 (L) 02/22/2020   HCT 26.0 (L) 02/22/2020   MCV 92.9 02/22/2020   PLT 435 (H) 02/22/2020     STUDIES: No results found.  ASSESSMENT: Stage IIIC ovarian cancer.  PLAN:    1. Stage IIIC ovarian cancer: CT scan results from November 14, 2019 reviewed independently with disease progression with enlargement of omental and peritoneal implants.  She also has new lesions within the right pelvis and hepatic lobe.  Her CA-125 was initially elevated to 326 and initially trended down to 130, but now has increased to 224.  Today's result is pending.  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 Plan to give treatment on days 1, 8, and 15 with a 22 off.  Proceed with cycle 6, day 8 of treatment today.  Given patient's persistent weakness and fatigue, will skip day 15 of treatment and patient will return to clinic in 3 weeks for further evaluation and consideration of cycle 7, day one. 2. Lupus anticoagulant: Patient was noted to have an elevated PTT as well as increased bleeding during her surgery for rectal prolapse.   3. Anemia: Chronic and unchanged.  Patient's hemoglobin is 8.6 today.  Previously, iron stores are within normal limits.  4.  Constipation: Continue OTC remedies as needed. 5.  Neutropenia: Resolved.  Proceed with treatment as above. 6.  Hyponatremia: Chronic and unchanged.  Sodium  is 131 today. 7.  Back/abdominal pain: Continue tramadol as needed. 8.  Rectal bleeding: Likely secondary to hemorrhoids, monitor.  Patient also has a history of rectal prolapse and can consider referral back to surgery if necessary. 9.  Weakness and fatigue: Proceed with treatment today and will not give day 15 of treatment for this cycle.   Patient expressed understanding and was in agreement with this plan. She also understands that She can call clinic at any time with any questions, concerns, or complaints.   Cancer Staging Malignant neoplasm of ovary [facility]) Staging form: Ovary, Fallopian Tube, and Primary Peritoneal Carcinoma, AJCC 8th Edition - Clinical stage from 05/29/2017: Stage IIIC (cT3c, cN1b, cM0) - Signed by Lloyd Huger, MD on 05/29/2017   Lloyd Huger, MD   02/23/2020 6:26 AM

## 2020-02-21 ENCOUNTER — Encounter: Payer: Self-pay | Admitting: Oncology

## 2020-02-21 NOTE — Progress Notes (Signed)
Patient was called for pre assessment. She denied any pain or concerns at this time. Patient has some questions about food such as bananas, oranges, etc. Would like to know if it is safe for her to eat fruit such as this.

## 2020-02-22 ENCOUNTER — Other Ambulatory Visit: Payer: Self-pay

## 2020-02-22 ENCOUNTER — Inpatient Hospital Stay: Payer: Medicare PPO

## 2020-02-22 ENCOUNTER — Inpatient Hospital Stay (HOSPITAL_BASED_OUTPATIENT_CLINIC_OR_DEPARTMENT_OTHER): Payer: Medicare PPO | Admitting: Oncology

## 2020-02-22 VITALS — BP 135/82 | HR 101 | Temp 97.6°F | Resp 20 | Wt 97.3 lb

## 2020-02-22 VITALS — BP 149/83 | HR 95 | Temp 97.7°F | Resp 20

## 2020-02-22 DIAGNOSIS — C569 Malignant neoplasm of unspecified ovary: Secondary | ICD-10-CM

## 2020-02-22 DIAGNOSIS — Z5111 Encounter for antineoplastic chemotherapy: Secondary | ICD-10-CM | POA: Diagnosis not present

## 2020-02-22 DIAGNOSIS — Z95828 Presence of other vascular implants and grafts: Secondary | ICD-10-CM

## 2020-02-22 LAB — COMPREHENSIVE METABOLIC PANEL
ALT: 13 U/L (ref 0–44)
AST: 25 U/L (ref 15–41)
Albumin: 3.5 g/dL (ref 3.5–5.0)
Alkaline Phosphatase: 75 U/L (ref 38–126)
Anion gap: 9 (ref 5–15)
BUN: 21 mg/dL (ref 8–23)
CO2: 23 mmol/L (ref 22–32)
Calcium: 8.5 mg/dL — ABNORMAL LOW (ref 8.9–10.3)
Chloride: 99 mmol/L (ref 98–111)
Creatinine, Ser: 0.83 mg/dL (ref 0.44–1.00)
GFR calc Af Amer: >60 mL/min (ref >60)
GFR calc non Af Amer: >60 mL/min (ref >60)
Glucose, Bld: 151 mg/dL — ABNORMAL HIGH (ref 70–99)
Potassium: 4.6 mmol/L (ref 3.5–5.1)
Sodium: 131 mmol/L — ABNORMAL LOW (ref 135–145)
Total Bilirubin: 0.4 mg/dL (ref 0.3–1.2)
Total Protein: 6.6 g/dL (ref 6.5–8.1)

## 2020-02-22 LAB — CBC WITH DIFFERENTIAL/PLATELET
Abs Immature Granulocytes: 0.05 10*3/uL (ref 0.00–0.07)
Basophils Absolute: 0 10*3/uL (ref 0.0–0.1)
Basophils Relative: 1 %
Eosinophils Absolute: 0.1 10*3/uL (ref 0.0–0.5)
Eosinophils Relative: 1 %
HCT: 26 % — ABNORMAL LOW (ref 36.0–46.0)
Hemoglobin: 8.6 g/dL — ABNORMAL LOW (ref 12.0–15.0)
Immature Granulocytes: 1 %
Lymphocytes Relative: 17 %
Lymphs Abs: 0.7 10*3/uL (ref 0.7–4.0)
MCH: 30.7 pg (ref 26.0–34.0)
MCHC: 33.1 g/dL (ref 30.0–36.0)
MCV: 92.9 fL (ref 80.0–100.0)
Monocytes Absolute: 0.4 10*3/uL (ref 0.1–1.0)
Monocytes Relative: 8 %
Neutro Abs: 3 10*3/uL (ref 1.7–7.7)
Neutrophils Relative %: 72 %
Platelets: 435 10*3/uL — ABNORMAL HIGH (ref 150–400)
RBC: 2.8 MIL/uL — ABNORMAL LOW (ref 3.87–5.11)
RDW: 16 % — ABNORMAL HIGH (ref 11.5–15.5)
WBC: 4.2 10*3/uL (ref 4.0–10.5)
nRBC: 0 % (ref 0.0–0.2)

## 2020-02-22 MED ORDER — HEPARIN SOD (PORK) LOCK FLUSH 100 UNIT/ML IV SOLN
500.0000 [IU] | Freq: Once | INTRAVENOUS | Status: AC | PRN
  Administered 2020-02-22: 500 [IU]
  Filled 2020-02-22: qty 5

## 2020-02-22 MED ORDER — SODIUM CHLORIDE 0.9 % IV SOLN
Freq: Once | INTRAVENOUS | Status: AC
  Filled 2020-02-22: qty 250

## 2020-02-22 MED ORDER — SODIUM CHLORIDE 0.9 % IV SOLN
1000.0000 mg | Freq: Once | INTRAVENOUS | Status: AC
  Administered 2020-02-22: 988.5932 mg via INTRAVENOUS
  Filled 2020-02-22: qty 26

## 2020-02-22 MED ORDER — PROCHLORPERAZINE MALEATE 10 MG PO TABS
10.0000 mg | ORAL_TABLET | Freq: Once | ORAL | Status: AC
  Administered 2020-02-22: 10 mg via ORAL
  Filled 2020-02-22: qty 1

## 2020-02-22 MED ORDER — HEPARIN SOD (PORK) LOCK FLUSH 100 UNIT/ML IV SOLN
INTRAVENOUS | Status: AC
  Filled 2020-02-22: qty 5

## 2020-02-22 MED ORDER — SODIUM CHLORIDE 0.9% FLUSH
10.0000 mL | INTRAVENOUS | Status: DC | PRN
  Administered 2020-02-22: 10 mL via INTRAVENOUS
  Filled 2020-02-22: qty 10

## 2020-02-24 LAB — CA 125: Cancer Antigen (CA) 125: 181 U/mL — ABNORMAL HIGH (ref 0.0–38.1)

## 2020-03-11 NOTE — Progress Notes (Signed)
[place]  [phone] [phone]  ID: Tamara Mckinney OB: [birth year]  MR#: [id number]  VPX#:[id number]  Patient Care Team: [name], MD as PCP - General (Family Medicine) [name], RN as Registered Nurse Herbert Pun, MD as Consulting Physician (General Surgery) Lloyd Huger, MD as Consulting Physician (Oncology)  CHIEF COMPLAINT: Stage IIIC ovarian cancer.  INTERVAL HISTORY: Patient returns to clinic today for further evaluation and consideration of cycle 7, day 1 of single agent gemcitabine.  She feels improved after 3 to 4-week delay between infusions.  She continues to have chronic weakness and fatigue.  She has occasional back pain and memory complaints.  She does not complain of any abdominal pain or bloating.  She has no neurologic complaints.  She has a good appetite and her weight has remained stable.  She denies any chest pain, shortness of breath, cough, or hemoptysis.  She denies any nausea, vomiting, or diarrhea.  She has no urinary complaints. Patient offers no further specific complaints today.  REVIEW OF SYSTEMS:   Review of Systems  Constitutional: Positive for malaise/fatigue. Negative for fever and weight loss.  Respiratory: Negative.  Negative for cough and shortness of breath.   Cardiovascular: Negative.  Negative for chest pain and leg swelling.  Gastrointestinal: Positive for constipation. Negative for abdominal pain, blood in stool, diarrhea, heartburn, melena, nausea and vomiting.  Genitourinary: Negative.  Negative for dysuria.  Musculoskeletal: Positive for back pain. Negative for neck pain.  Skin: Negative.  Negative for itching and rash.  Neurological: Positive for weakness. Negative for dizziness and focal weakness.  Endo/Heme/Allergies: Does not bruise/bleed easily.  Psychiatric/Behavioral: Positive for memory loss. The patient is not nervous/anxious.     As per HPI. Otherwise, a  complete review of systems is negative.  PAST MEDICAL HISTORY: Past Medical History:  Diagnosis Date   Anemia    Arthritis    Asthma    Dyspnea    GERD (gastroesophageal reflux disease)    Hypertension    Ovarian cancer ([place]) 2018    PAST SURGICAL HISTORY: Past Surgical History:  Procedure Laterality Date   ABDOMINAL HYSTERECTOMY     BREAST BIOPSY     x6   BUNIONECTOMY Bilateral    CATARACT EXTRACTION, BILATERAL     FLEXIBLE SIGMOIDOSCOPY N/A 05/21/2017   Procedure: FLEXIBLE SIGMOIDOSCOPY;  Surgeon: Lin Landsman, MD;  Location: [facility];  Service: Gastroenterology;  Laterality: N/A;   INCONTINENCE SURGERY     PORTA CATH INSERTION N/A 06/16/2017   Procedure: PORTA CATH INSERTION;  Surgeon: Algernon Huxley, MD;  Location: [facility];  Service: Cardiovascular;  Laterality: N/A;   RECTAL SURGERY  04/2018   REPAIR OF RECTAL PROLAPSE N/A 05/11/2017   Procedure: REPAIR OF RECTAL PROLAPSE;  Surgeon: Leonie Green, MD;  Location: [facility];  Service: General;  Laterality: N/A;   TONSILLECTOMY     VAGINA SURGERY     uncertain procedure performed    FAMILY HISTORY: Family History  Problem Relation Age of Onset   Heart disease Mother    Heart disease Father    Heart disease Sister    Heart attack Sister    Ulcerative colitis Brother    Lung cancer Brother    Thyroid cancer Sister    Asthma Sister    Diabetes Sister    Asthma Sister    Pancreatic cancer Sister    Dementia Sister    Asthma Brother    Heart disease Brother  Asthma Brother    Lung cancer Brother    Asthma Brother    Lung cancer Brother    Lung cancer Brother    Rheum arthritis Brother     ADVANCED DIRECTIVES (Y/N):  N  HEALTH MAINTENANCE: Social History   Tobacco Use   Smoking status: Never Smoker   Smokeless tobacco: Never Used  Scientific laboratory technician Use: Never used  Substance Use Topics   Alcohol use: No   Drug use: No      Colonoscopy:  PAP:  Bone density:  Lipid panel:  No Known Allergies  No current facility-administered medications for this visit.   No current outpatient medications on file.   Facility-Administered Medications Ordered in Other Visits  Medication Dose Route Frequency Provider Last Rate Last Admin   0.9 %  sodium chloride infusion   Intravenous Once Elwyn Reach, MD       acetaminophen (TYLENOL) tablet 650 mg  650 mg Oral Q6H PRN Elwyn Reach, MD       Or   acetaminophen (TYLENOL) suppository 650 mg  650 mg Rectal Q6H PRN Elwyn Reach, MD       ceFEPIme (MAXIPIME) 2 g in sodium chloride 0.9 % 100 mL IVPB  2 g Intravenous Q24H Elwyn Reach, MD       cyclobenzaprine (FLEXERIL) tablet 10 mg  10 mg Oral TID PRN Elwyn Reach, MD       enoxaparin (LOVENOX) injection 40 mg  40 mg Subcutaneous Q24H Garba, Mohammad L, MD       ipratropium-albuterol (DUONEB) 0.5-2.5 (3) MG/3ML nebulizer solution 3 mL  3 mL Nebulization Q2H PRN Sharion Settler, NP   3 mL at 03/16/20 0045   irbesartan (AVAPRO) tablet 150 mg  150 mg Oral Daily Elwyn Reach, MD       metroNIDAZOLE (FLAGYL) IVPB 500 mg  500 mg Intravenous Q8H Gala Romney L, MD 100 mL/hr at 03/16/20 0736 500 mg at 03/16/20 0736   montelukast (SINGULAIR) tablet 10 mg  10 mg Oral QHS PRN Elwyn Reach, MD       ondansetron (ZOFRAN) tablet 4 mg  4 mg Oral Q6H PRN Elwyn Reach, MD       Or   ondansetron (ZOFRAN) injection 4 mg  4 mg Intravenous Q6H PRN Elwyn Reach, MD       pantoprazole (PROTONIX) EC tablet 40 mg  40 mg Oral Daily Garba, Mohammad L, MD       polyethylene glycol (MIRALAX / GLYCOLAX) packet 17 g  17 g Oral Daily PRN Gala Romney L, MD       potassium chloride 10 mEq in 100 mL IVPB  10 mEq Intravenous Q1 Hr x 3 Zhang, Dekui, MD       sodium chloride flush (NS) 0.9 % injection 10 mL  10 mL Intravenous PRN Lloyd Huger, MD   10 mL at 03/10/18 1200   vancomycin  (VANCOREADY) IVPB 500 mg/100 mL  500 mg Intravenous Q24H Gala Romney L, MD        OBJECTIVE: Vitals:   03/14/20 1044  BP: (!) 129/69  Pulse: 96  Resp: 20  Temp: 99.3 F (37.4 C)  SpO2: 97%     Body mass index is 20.2 kg/m.    ECOG FS:0 - Asymptomatic  General: Thin, no acute distress. Eyes: Pink conjunctiva, anicteric sclera. HEENT: Normocephalic, moist mucous membranes. Lungs: No audible wheezing or coughing. Heart: Regular rate and rhythm. Abdomen:  Soft, nontender, no obvious distention. Musculoskeletal: No edema, cyanosis, or clubbing. Neuro: Alert, answering all questions appropriately. Cranial nerves grossly intact. Skin: No rashes or petechiae noted. Psych: Normal affect.  LAB RESULTS:  Lab Results  Component Value Date   NA 128 (L) 03/16/2020   K 2.9 (L) 03/16/2020   CL 99 03/16/2020   CO2 20 (L) 03/16/2020   GLUCOSE 112 (H) 03/16/2020   BUN 28 (H) 03/16/2020   CREATININE 0.87 03/16/2020   CALCIUM 7.8 (L) 03/16/2020   PROT 5.2 (L) 03/16/2020   ALBUMIN 2.6 (L) 03/16/2020   AST 152 (H) 03/16/2020   ALT 88 (H) 03/16/2020   ALKPHOS 66 03/16/2020   BILITOT 1.2 03/16/2020   GFRNONAA 59 (L) 03/16/2020   GFRAA >60 03/16/2020    Lab Results  Component Value Date   WBC 15.6 (H) 03/16/2020   NEUTROABS 17.9 (H) 03/15/2020   HGB 8.5 (L) 03/16/2020   HCT 26.0 (L) 03/16/2020   MCV 88.7 03/16/2020   PLT 262 03/16/2020     STUDIES: CT ABDOMEN PELVIS W CONTRAST  Result Date: 03/15/2020 CLINICAL DATA:  Abdominal abscess or infection suspected. EXAM: CT ABDOMEN AND PELVIS WITH CONTRAST TECHNIQUE: Multidetector CT imaging of the abdomen and pelvis was performed using the standard protocol following bolus administration of intravenous contrast. CONTRAST:  42mL OMNIPAQUE IOHEXOL 300 MG/ML  SOLN COMPARISON:  November 14, 2019 FINDINGS: Lower chest: Hiatal hernia similar to prior study. No consolidation. No pleural effusion. Hepatobiliary: New hepatic masses. (Image 19,  series 2) 5.2 x 4.7 cm. This is in the RIGHT hemi liver. And RIGHT hepatic vein and portal veins can be seen passing through this area. Another lesion measuring 2.4 cm in the anterior RIGHT hemi liver (image 15, series 2) 2 additional foci in the central RIGHT liver and 2 more near the dome of the RIGHT hemi liver of similar size. These are low-density and septate. Surrounding edema in the liver is not appreciated. No significant biliary duct dilation or pericholecystic stranding. Pancreas: Pancreas is largely encased in the distal splenorenal ligament by tumor implant that is now more confluent than on the previous study. This measures approximately 7.7 x 5.0 cm as compared to 9.3 x 5.5 cm. This shows some small locules of gas within the lateral portion of the tumor on image 16 of series 2, this is of uncertain significance adjacent to the splenic flexure. Spleen: Splenic hilum infiltrated by tumor with similar appearance as described. Adrenals/Urinary Tract: RIGHT adrenal lesion unchanged. Symmetric renal enhancement. No hydronephrosis. Urinary is distended and extends below the pelvic floor. Stomach/Bowel: Postoperative changes about the rectum. Individual bowel loops are difficult to assess due to extensive tumor in the abdomen mildly dilated and filled with stool like material small-bowel loops are pushed into the RIGHT hemiabdomen. The appendix is normal. Vascular/Lymphatic: Tortuous abdominal aorta without aneurysmal dilation with calcified atheromatous plaques similar to the previous study. No retroperitoneal lymphadenopathy. No pelvic lymphadenopathy. Reproductive: Post hysterectomy. Signs of pelvic floor dysfunction as described. Other: No free air. LEFT retroperitoneal tumor implant is similar to the prior study. Musculoskeletal: No acute bone finding. Spinal degenerative changes and scoliotic curvature of the spine with similar appearance to previous imaging. IMPRESSION: 1. New hepatic masses that are  suspicious for metastatic disease with some atypical features given that vessels can be seen passing through these areas and that they are seen in the setting of tumor with peritoneal predilection, less commonly associated with hepatic parenchymal disease though this could be seen in  the setting of advanced ovarian cancer. In the setting of fever, hepatic abscesses should be considered. Correlate with any recent antibiotic administration as an outpatient that may explain the lack of edema. Ultimately sampling of these areas may be helpful to differentiate. 2. Decrease in size of some areas but with gas in the area of tumor in the splenorenal ligament, this raises the question of secondary infection or gas related to necrosis. 3. Enlarging dominant lesion in the anterior abdomen with displacement of numerous bowel loops. Aortic Atherosclerosis (ICD10-I70.0). Electronically Signed   By: Zetta Bills M.D.   On: 03/15/2020 20:07   DG Chest Portable 1 View  Result Date: 03/16/2020 CLINICAL DATA:  Shortness of breath EXAM: PORTABLE CHEST 1 VIEW COMPARISON:  March 15, 2020 FINDINGS: There is mild cardiomegaly. Aortic knob calcifications are seen. Prominence of the central pulmonary vasculature are again noted. No large airspace consolidation or pleural effusion. A right-sided MediPort catheter seen with the tip in the mid SVC. No acute osseous abnormality. A small hiatal hernia is present. IMPRESSION: Mild pulmonary vascular congestion. Electronically Signed   By: Prudencio Pair M.D.   On: 03/16/2020 01:23   DG Chest Port 1 View  Result Date: 03/15/2020 CLINICAL DATA:  Fever.  Chemotherapy yesterday. EXAM: PORTABLE CHEST 1 VIEW COMPARISON:  No prior chest imaging. FINDINGS: Right chest port remains in place. Upper normal heart size with retrocardiac hiatal hernia. No focal airspace disease. No pleural effusion or pneumothorax. No evidence of pulmonary edema. Scoliotic curvature of the spine without acute osseous  abnormalities. IMPRESSION: 1. No acute chest findings. 2. Hiatal hernia. Electronically Signed   By: Keith Rake M.D.   On: 03/15/2020 19:52    ASSESSMENT: Stage IIIC ovarian cancer.  PLAN:    1. Stage IIIC ovarian cancer: Patient was admitted to the hospital 1 day after her clinic visit and CT scan from March 15, 2020 revealed possible progression of disease with hepatic masses, although there is concern that these may be abscess.  Dominant lesion in anterior abdomen has increased in size.  Despite this, CA-125 has decreased to 146.0.  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 Plan to give treatment on days 1, 8, and 15 with a 22 off.  Patient received cycle 7, day 1 of single agent gemcitabine on March 14, 2020.  Return to clinic in 1 week for further evaluation and consideration of cycle 7, day 8.   2. Lupus anticoagulant: Patient was noted to have an elevated PTT as well as increased bleeding during her surgery for rectal prolapse.   3. Anemia: Chronic and unchanged.  Patient's hemoglobin on day of treatment was 8.9.  Previously, iron stores are within normal limits.  4.  Constipation: Continue OTC remedies as needed. 5.  Neutropenia: Resolved.  Proceed with treatment as above. 6.  Hyponatremia: Chronic and unchanged.  Sodium is 129 today. 7.  Back/abdominal pain: Continue tramadol as needed. 8.  Rectal bleeding: Likely secondary to hemorrhoids, monitor.  Patient also has a history of rectal prolapse and can consider referral back to surgery if necessary. 9.  Weakness and fatigue: Improved.  Patient expressed understanding and was in agreement with this plan. She also understands that She can call clinic at any time with any questions, concerns, or complaints.   Cancer Staging Malignant neoplasm of ovary [facility]) Staging form: Ovary, Fallopian Tube, and Primary Peritoneal Carcinoma, AJCC 8th Edition - Clinical stage from 05/29/2017: Stage IIIC  (cT3c, cN1b, cM0) -  Signed by Lloyd Huger, MD on 05/29/2017   Lloyd Huger, MD   03/16/2020 8:17 AM

## 2020-03-13 NOTE — Progress Notes (Signed)
Patient complains of itching on sides and other parts of her body, states this has been ongoing for several months. Denies using any ointments, creams or medications because she is not sure what will help. States in the last week it has gotten worse.

## 2020-03-14 ENCOUNTER — Inpatient Hospital Stay (HOSPITAL_BASED_OUTPATIENT_CLINIC_OR_DEPARTMENT_OTHER): Payer: Medicare PPO | Admitting: Oncology

## 2020-03-14 ENCOUNTER — Inpatient Hospital Stay: Payer: Medicare PPO

## 2020-03-14 ENCOUNTER — Encounter: Payer: Self-pay | Admitting: Oncology

## 2020-03-14 ENCOUNTER — Other Ambulatory Visit: Payer: Self-pay

## 2020-03-14 VITALS — BP 129/69 | HR 96 | Temp 99.3°F | Resp 20 | Wt 95.0 lb

## 2020-03-14 DIAGNOSIS — R509 Fever, unspecified: Secondary | ICD-10-CM | POA: Diagnosis not present

## 2020-03-14 DIAGNOSIS — C569 Malignant neoplasm of unspecified ovary: Secondary | ICD-10-CM

## 2020-03-14 DIAGNOSIS — A409 Streptococcal sepsis, unspecified: Secondary | ICD-10-CM | POA: Diagnosis not present

## 2020-03-14 LAB — COMPREHENSIVE METABOLIC PANEL
ALT: 14 U/L (ref 0–44)
AST: 28 U/L (ref 15–41)
Albumin: 3.6 g/dL (ref 3.5–5.0)
Alkaline Phosphatase: 77 U/L (ref 38–126)
Anion gap: 9 (ref 5–15)
BUN: 26 mg/dL — ABNORMAL HIGH (ref 8–23)
CO2: 24 mmol/L (ref 22–32)
Calcium: 8.5 mg/dL — ABNORMAL LOW (ref 8.9–10.3)
Chloride: 96 mmol/L — ABNORMAL LOW (ref 98–111)
Creatinine, Ser: 0.97 mg/dL (ref 0.44–1.00)
GFR calc Af Amer: 60 mL/min — ABNORMAL LOW (ref >60)
GFR calc non Af Amer: 51 mL/min — ABNORMAL LOW (ref >60)
Glucose, Bld: 139 mg/dL — ABNORMAL HIGH (ref 70–99)
Potassium: 4.5 mmol/L (ref 3.5–5.1)
Sodium: 129 mmol/L — ABNORMAL LOW (ref 135–145)
Total Bilirubin: 0.7 mg/dL (ref 0.3–1.2)
Total Protein: 6.6 g/dL (ref 6.5–8.1)

## 2020-03-14 LAB — CBC WITH DIFFERENTIAL/PLATELET
Abs Immature Granulocytes: 0.16 10*3/uL — ABNORMAL HIGH (ref 0.00–0.07)
Basophils Absolute: 0 10*3/uL (ref 0.0–0.1)
Basophils Relative: 0 %
Eosinophils Absolute: 0 10*3/uL (ref 0.0–0.5)
Eosinophils Relative: 0 %
HCT: 26.7 % — ABNORMAL LOW (ref 36.0–46.0)
Hemoglobin: 8.9 g/dL — ABNORMAL LOW (ref 12.0–15.0)
Immature Granulocytes: 1 %
Lymphocytes Relative: 2 %
Lymphs Abs: 0.4 10*3/uL — ABNORMAL LOW (ref 0.7–4.0)
MCH: 31.2 pg (ref 26.0–34.0)
MCHC: 33.3 g/dL (ref 30.0–36.0)
MCV: 93.7 fL (ref 80.0–100.0)
Monocytes Absolute: 1.6 10*3/uL — ABNORMAL HIGH (ref 0.1–1.0)
Monocytes Relative: 8 %
Neutro Abs: 16.4 10*3/uL — ABNORMAL HIGH (ref 1.7–7.7)
Neutrophils Relative %: 89 %
Platelets: 518 10*3/uL — ABNORMAL HIGH (ref 150–400)
RBC: 2.85 MIL/uL — ABNORMAL LOW (ref 3.87–5.11)
RDW: 16.4 % — ABNORMAL HIGH (ref 11.5–15.5)
WBC: 18.5 10*3/uL — ABNORMAL HIGH (ref 4.0–10.5)
nRBC: 0 % (ref 0.0–0.2)

## 2020-03-14 MED ORDER — SODIUM CHLORIDE 0.9 % IV SOLN
1000.0000 mg | Freq: Once | INTRAVENOUS | Status: AC
  Administered 2020-03-14: 988.5932 mg via INTRAVENOUS
  Filled 2020-03-14: qty 26

## 2020-03-14 MED ORDER — SODIUM CHLORIDE 0.9% FLUSH
10.0000 mL | Freq: Once | INTRAVENOUS | Status: AC
  Administered 2020-03-14: 10 mL via INTRAVENOUS
  Filled 2020-03-14: qty 10

## 2020-03-14 MED ORDER — SODIUM CHLORIDE 0.9 % IV SOLN
Freq: Once | INTRAVENOUS | Status: AC
  Filled 2020-03-14: qty 250

## 2020-03-14 MED ORDER — HEPARIN SOD (PORK) LOCK FLUSH 100 UNIT/ML IV SOLN
INTRAVENOUS | Status: AC
  Filled 2020-03-14: qty 5

## 2020-03-14 MED ORDER — HEPARIN SOD (PORK) LOCK FLUSH 100 UNIT/ML IV SOLN
500.0000 [IU] | Freq: Once | INTRAVENOUS | Status: AC
  Administered 2020-03-14: 500 [IU] via INTRAVENOUS
  Filled 2020-03-14: qty 5

## 2020-03-14 MED ORDER — PROCHLORPERAZINE MALEATE 10 MG PO TABS
10.0000 mg | ORAL_TABLET | Freq: Once | ORAL | Status: AC
  Administered 2020-03-14: 10 mg via ORAL
  Filled 2020-03-14: qty 1

## 2020-03-14 NOTE — Progress Notes (Signed)
Pt's wbc elevated at 18.5 today. Pt was seen in office visit today. Okay to treat per Dr Grayland Ormond

## 2020-03-15 ENCOUNTER — Telehealth: Payer: Self-pay | Admitting: *Deleted

## 2020-03-15 ENCOUNTER — Emergency Department: Payer: Medicare PPO

## 2020-03-15 ENCOUNTER — Inpatient Hospital Stay
Admission: EM | Admit: 2020-03-15 | Discharge: 2020-03-22 | DRG: 871 | Disposition: A | Discharge status: Home Health | Payer: Medicare PPO | Attending: Student | Admitting: Student

## 2020-03-15 ENCOUNTER — Other Ambulatory Visit: Payer: Self-pay

## 2020-03-15 DIAGNOSIS — C801 Malignant (primary) neoplasm, unspecified: Secondary | ICD-10-CM | POA: Diagnosis not present

## 2020-03-15 DIAGNOSIS — A419 Sepsis, unspecified organism: Secondary | ICD-10-CM | POA: Diagnosis not present

## 2020-03-15 DIAGNOSIS — K769 Liver disease, unspecified: Secondary | ICD-10-CM | POA: Diagnosis not present

## 2020-03-15 DIAGNOSIS — D6862 Lupus anticoagulant syndrome: Secondary | ICD-10-CM | POA: Diagnosis present

## 2020-03-15 DIAGNOSIS — K219 Gastro-esophageal reflux disease without esophagitis: Secondary | ICD-10-CM | POA: Diagnosis present

## 2020-03-15 DIAGNOSIS — G8929 Other chronic pain: Secondary | ICD-10-CM | POA: Diagnosis present

## 2020-03-15 DIAGNOSIS — M545 Low back pain: Secondary | ICD-10-CM | POA: Diagnosis not present

## 2020-03-15 DIAGNOSIS — D509 Iron deficiency anemia, unspecified: Secondary | ICD-10-CM | POA: Diagnosis present

## 2020-03-15 DIAGNOSIS — J9601 Acute respiratory failure with hypoxia: Secondary | ICD-10-CM | POA: Diagnosis present

## 2020-03-15 DIAGNOSIS — I5031 Acute diastolic (congestive) heart failure: Secondary | ICD-10-CM | POA: Diagnosis present

## 2020-03-15 DIAGNOSIS — D739 Disease of spleen, unspecified: Secondary | ICD-10-CM | POA: Diagnosis not present

## 2020-03-15 DIAGNOSIS — Z8249 Family history of ischemic heart disease and other diseases of the circulatory system: Secondary | ICD-10-CM | POA: Diagnosis not present

## 2020-03-15 DIAGNOSIS — K5900 Constipation, unspecified: Secondary | ICD-10-CM | POA: Diagnosis present

## 2020-03-15 DIAGNOSIS — Z66 Do not resuscitate: Secondary | ICD-10-CM | POA: Diagnosis present

## 2020-03-15 DIAGNOSIS — R6520 Severe sepsis without septic shock: Secondary | ICD-10-CM

## 2020-03-15 DIAGNOSIS — D649 Anemia, unspecified: Secondary | ICD-10-CM

## 2020-03-15 DIAGNOSIS — Z20822 Contact with and (suspected) exposure to covid-19: Secondary | ICD-10-CM | POA: Diagnosis present

## 2020-03-15 DIAGNOSIS — Z681 Body mass index (BMI) 19 or less, adult: Secondary | ICD-10-CM

## 2020-03-15 DIAGNOSIS — B954 Other streptococcus as the cause of diseases classified elsewhere: Secondary | ICD-10-CM | POA: Diagnosis not present

## 2020-03-15 DIAGNOSIS — K623 Rectal prolapse: Secondary | ICD-10-CM | POA: Diagnosis present

## 2020-03-15 DIAGNOSIS — T451X5A Adverse effect of antineoplastic and immunosuppressive drugs, initial encounter: Secondary | ICD-10-CM | POA: Diagnosis present

## 2020-03-15 DIAGNOSIS — B955 Unspecified streptococcus as the cause of diseases classified elsewhere: Secondary | ICD-10-CM | POA: Diagnosis not present

## 2020-03-15 DIAGNOSIS — I081 Rheumatic disorders of both mitral and tricuspid valves: Secondary | ICD-10-CM | POA: Diagnosis present

## 2020-03-15 DIAGNOSIS — E222 Syndrome of inappropriate secretion of antidiuretic hormone: Secondary | ICD-10-CM | POA: Diagnosis present

## 2020-03-15 DIAGNOSIS — M797 Fibromyalgia: Secondary | ICD-10-CM | POA: Diagnosis present

## 2020-03-15 DIAGNOSIS — R7881 Bacteremia: Secondary | ICD-10-CM

## 2020-03-15 DIAGNOSIS — Z90710 Acquired absence of both cervix and uterus: Secondary | ICD-10-CM

## 2020-03-15 DIAGNOSIS — C569 Malignant neoplasm of unspecified ovary: Secondary | ICD-10-CM | POA: Diagnosis present

## 2020-03-15 DIAGNOSIS — E871 Hypo-osmolality and hyponatremia: Secondary | ICD-10-CM | POA: Diagnosis present

## 2020-03-15 DIAGNOSIS — D84821 Immunodeficiency due to drugs: Secondary | ICD-10-CM | POA: Diagnosis present

## 2020-03-15 DIAGNOSIS — D72825 Bandemia: Secondary | ICD-10-CM

## 2020-03-15 DIAGNOSIS — C786 Secondary malignant neoplasm of retroperitoneum and peritoneum: Secondary | ICD-10-CM | POA: Diagnosis present

## 2020-03-15 DIAGNOSIS — R160 Hepatomegaly, not elsewhere classified: Secondary | ICD-10-CM | POA: Diagnosis not present

## 2020-03-15 DIAGNOSIS — E43 Unspecified severe protein-calorie malnutrition: Secondary | ICD-10-CM | POA: Diagnosis present

## 2020-03-15 DIAGNOSIS — N289 Disorder of kidney and ureter, unspecified: Secondary | ICD-10-CM | POA: Diagnosis not present

## 2020-03-15 DIAGNOSIS — M1990 Unspecified osteoarthritis, unspecified site: Secondary | ICD-10-CM | POA: Diagnosis present

## 2020-03-15 DIAGNOSIS — I110 Hypertensive heart disease with heart failure: Secondary | ICD-10-CM | POA: Diagnosis present

## 2020-03-15 DIAGNOSIS — Z79899 Other long term (current) drug therapy: Secondary | ICD-10-CM

## 2020-03-15 DIAGNOSIS — Z95828 Presence of other vascular implants and grafts: Secondary | ICD-10-CM

## 2020-03-15 DIAGNOSIS — D630 Anemia in neoplastic disease: Secondary | ICD-10-CM | POA: Diagnosis present

## 2020-03-15 DIAGNOSIS — I10 Essential (primary) hypertension: Secondary | ICD-10-CM | POA: Diagnosis present

## 2020-03-15 DIAGNOSIS — D500 Iron deficiency anemia secondary to blood loss (chronic): Secondary | ICD-10-CM | POA: Diagnosis not present

## 2020-03-15 DIAGNOSIS — K625 Hemorrhage of anus and rectum: Secondary | ICD-10-CM | POA: Diagnosis present

## 2020-03-15 DIAGNOSIS — I341 Nonrheumatic mitral (valve) prolapse: Secondary | ICD-10-CM | POA: Diagnosis not present

## 2020-03-15 DIAGNOSIS — M25519 Pain in unspecified shoulder: Secondary | ICD-10-CM

## 2020-03-15 DIAGNOSIS — I340 Nonrheumatic mitral (valve) insufficiency: Secondary | ICD-10-CM | POA: Diagnosis not present

## 2020-03-15 DIAGNOSIS — A408 Other streptococcal sepsis: Secondary | ICD-10-CM | POA: Diagnosis not present

## 2020-03-15 DIAGNOSIS — R509 Fever, unspecified: Secondary | ICD-10-CM | POA: Diagnosis present

## 2020-03-15 DIAGNOSIS — Z825 Family history of asthma and other chronic lower respiratory diseases: Secondary | ICD-10-CM

## 2020-03-15 DIAGNOSIS — E876 Hypokalemia: Secondary | ICD-10-CM | POA: Diagnosis present

## 2020-03-15 DIAGNOSIS — M25512 Pain in left shoulder: Secondary | ICD-10-CM | POA: Diagnosis present

## 2020-03-15 DIAGNOSIS — J45901 Unspecified asthma with (acute) exacerbation: Secondary | ICD-10-CM | POA: Diagnosis present

## 2020-03-15 DIAGNOSIS — I361 Nonrheumatic tricuspid (valve) insufficiency: Secondary | ICD-10-CM | POA: Diagnosis not present

## 2020-03-15 DIAGNOSIS — K750 Abscess of liver: Secondary | ICD-10-CM

## 2020-03-15 DIAGNOSIS — M3500 Sicca syndrome, unspecified: Secondary | ICD-10-CM | POA: Diagnosis present

## 2020-03-15 DIAGNOSIS — E860 Dehydration: Secondary | ICD-10-CM | POA: Diagnosis present

## 2020-03-15 DIAGNOSIS — I371 Nonrheumatic pulmonary valve insufficiency: Secondary | ICD-10-CM | POA: Diagnosis not present

## 2020-03-15 DIAGNOSIS — A409 Streptococcal sepsis, unspecified: Secondary | ICD-10-CM | POA: Diagnosis present

## 2020-03-15 DIAGNOSIS — J45909 Unspecified asthma, uncomplicated: Secondary | ICD-10-CM | POA: Diagnosis present

## 2020-03-15 DIAGNOSIS — D72829 Elevated white blood cell count, unspecified: Secondary | ICD-10-CM | POA: Diagnosis present

## 2020-03-15 LAB — URINALYSIS, COMPLETE (UACMP) WITH MICROSCOPIC
Bacteria, UA: NONE SEEN
Bilirubin Urine: NEGATIVE
Glucose, UA: 50 mg/dL — AB
Ketones, ur: NEGATIVE mg/dL
Leukocytes,Ua: NEGATIVE
Nitrite: NEGATIVE
Protein, ur: 30 mg/dL — AB
Specific Gravity, Urine: 1.021 (ref 1.005–1.030)
Squamous Epithelial / HPF: NONE SEEN (ref 0–5)
pH: 5 (ref 5.0–8.0)

## 2020-03-15 LAB — COMPREHENSIVE METABOLIC PANEL
ALT: 74 U/L — ABNORMAL HIGH (ref 0–44)
AST: 149 U/L — ABNORMAL HIGH (ref 15–41)
Albumin: 2.8 g/dL — ABNORMAL LOW (ref 3.5–5.0)
Alkaline Phosphatase: 86 U/L (ref 38–126)
Anion gap: 9 (ref 5–15)
BUN: 37 mg/dL — ABNORMAL HIGH (ref 8–23)
CO2: 19 mmol/L — ABNORMAL LOW (ref 22–32)
Calcium: 7.7 mg/dL — ABNORMAL LOW (ref 8.9–10.3)
Chloride: 101 mmol/L (ref 98–111)
Creatinine, Ser: 1.02 mg/dL — ABNORMAL HIGH (ref 0.44–1.00)
GFR calc Af Amer: 56 mL/min — ABNORMAL LOW (ref >60)
GFR calc non Af Amer: 48 mL/min — ABNORMAL LOW (ref >60)
Glucose, Bld: 120 mg/dL — ABNORMAL HIGH (ref 70–99)
Potassium: 3.8 mmol/L (ref 3.5–5.1)
Sodium: 129 mmol/L — ABNORMAL LOW (ref 135–145)
Total Bilirubin: 0.7 mg/dL (ref 0.3–1.2)
Total Protein: 5.6 g/dL — ABNORMAL LOW (ref 6.5–8.1)

## 2020-03-15 LAB — CBC WITH DIFFERENTIAL/PLATELET
Abs Immature Granulocytes: 0.36 10*3/uL — ABNORMAL HIGH (ref 0.00–0.07)
Basophils Absolute: 0 10*3/uL (ref 0.0–0.1)
Basophils Relative: 0 %
Eosinophils Absolute: 0 10*3/uL (ref 0.0–0.5)
Eosinophils Relative: 0 %
HCT: 21.5 % — ABNORMAL LOW (ref 36.0–46.0)
Hemoglobin: 7.4 g/dL — ABNORMAL LOW (ref 12.0–15.0)
Immature Granulocytes: 2 %
Lymphocytes Relative: 1 %
Lymphs Abs: 0.2 10*3/uL — ABNORMAL LOW (ref 0.7–4.0)
MCH: 31.2 pg (ref 26.0–34.0)
MCHC: 34.4 g/dL (ref 30.0–36.0)
MCV: 90.7 fL (ref 80.0–100.0)
Monocytes Absolute: 0.6 10*3/uL (ref 0.1–1.0)
Monocytes Relative: 3 %
Neutro Abs: 17.9 10*3/uL — ABNORMAL HIGH (ref 1.7–7.7)
Neutrophils Relative %: 94 %
Platelets: 306 10*3/uL (ref 150–400)
RBC: 2.37 MIL/uL — ABNORMAL LOW (ref 3.87–5.11)
RDW: 16.8 % — ABNORMAL HIGH (ref 11.5–15.5)
Smear Review: NORMAL
WBC: 18.8 10*3/uL — ABNORMAL HIGH (ref 4.0–10.5)
nRBC: 0 % (ref 0.0–0.2)

## 2020-03-15 LAB — LACTIC ACID, PLASMA
Lactic Acid, Venous: 1.6 mmol/L (ref 0.5–1.9)
Lactic Acid, Venous: 1.2 mmol/L (ref 0.5–1.9)

## 2020-03-15 LAB — SARS CORONAVIRUS 2 BY RT PCR (HOSPITAL ORDER, PERFORMED IN ~~LOC~~ HOSPITAL LAB): SARS Coronavirus 2: NEGATIVE

## 2020-03-15 LAB — CA 125: Cancer Antigen (CA) 125: 146 U/mL — ABNORMAL HIGH (ref 0.0–38.1)

## 2020-03-15 LAB — PROTIME-INR
INR: 1.3 — ABNORMAL HIGH (ref 0.8–1.2)
Prothrombin Time: 15.9 seconds — ABNORMAL HIGH (ref 11.4–15.2)

## 2020-03-15 LAB — SAMPLE TO BLOOD BANK

## 2020-03-15 MED ORDER — VANCOMYCIN HCL IN DEXTROSE 1-5 GM/200ML-% IV SOLN
1000.0000 mg | Freq: Once | INTRAVENOUS | Status: AC
  Administered 2020-03-15: 1000 mg via INTRAVENOUS
  Filled 2020-03-15: qty 200

## 2020-03-15 MED ORDER — LACTATED RINGERS IV BOLUS (SEPSIS)
1000.0000 mL | Freq: Once | INTRAVENOUS | Status: AC
  Administered 2020-03-15: 1000 mL via INTRAVENOUS

## 2020-03-15 MED ORDER — POLYETHYLENE GLYCOL 3350 17 G PO PACK: 17.0000 g | PACK | Freq: Every day | ORAL | Status: DC | PRN

## 2020-03-15 MED ORDER — SODIUM CHLORIDE 0.9 % IV BOLUS
1000.0000 mL | Freq: Once | INTRAVENOUS | Status: AC
  Administered 2020-03-15: 1000 mL via INTRAVENOUS

## 2020-03-15 MED ORDER — PANTOPRAZOLE SODIUM 40 MG PO TBEC
40.0000 mg | DELAYED_RELEASE_TABLET | Freq: Every day | ORAL | Status: DC
  Administered 2020-03-16 – 2020-03-22 (×7): 40 mg via ORAL
  Filled 2020-03-15 (×7): qty 1

## 2020-03-15 MED ORDER — CYCLOBENZAPRINE HCL 10 MG PO TABS
10.0000 mg | ORAL_TABLET | Freq: Three times a day (TID) | ORAL | Status: DC | PRN
  Administered 2020-03-19 – 2020-03-21 (×2): 10 mg via ORAL
  Filled 2020-03-15 (×3): qty 1

## 2020-03-15 MED ORDER — ACETAMINOPHEN 500 MG PO TABS: 500.0000 mg | ORAL_TABLET | Freq: Four times a day (QID) | ORAL | Status: DC | PRN

## 2020-03-15 MED ORDER — METRONIDAZOLE IN NACL 5-0.79 MG/ML-% IV SOLN
500.0000 mg | Freq: Three times a day (TID) | INTRAVENOUS | Status: DC
  Administered 2020-03-16 – 2020-03-21 (×14): 500 mg via INTRAVENOUS
  Filled 2020-03-15 (×19): qty 100

## 2020-03-15 MED ORDER — SODIUM CHLORIDE 0.9 % IV SOLN
2.0000 g | INTRAVENOUS | Status: DC
  Filled 2020-03-15: qty 2

## 2020-03-15 MED ORDER — VANCOMYCIN HCL 500 MG/100ML IV SOLN
500.0000 mg | INTRAVENOUS | Status: DC
  Administered 2020-03-16: 500 mg via INTRAVENOUS
  Filled 2020-03-15 (×2): qty 100

## 2020-03-15 MED ORDER — MONTELUKAST SODIUM 10 MG PO TABS
10.0000 mg | ORAL_TABLET | Freq: Every evening | ORAL | Status: DC | PRN
  Filled 2020-03-15: qty 1

## 2020-03-15 MED ORDER — ACETAMINOPHEN 650 MG RE SUPP: 650.0000 mg | Freq: Four times a day (QID) | RECTAL | Status: DC | PRN

## 2020-03-15 MED ORDER — SODIUM CHLORIDE 0.9 % IV SOLN
2.0000 g | Freq: Once | INTRAVENOUS | Status: AC
  Administered 2020-03-15: 2 g via INTRAVENOUS
  Filled 2020-03-15: qty 2

## 2020-03-15 MED ORDER — IOHEXOL 300 MG/ML  SOLN
75.0000 mL | Freq: Once | INTRAMUSCULAR | Status: AC | PRN
  Administered 2020-03-15: 75 mL via INTRAVENOUS

## 2020-03-15 MED ORDER — LACTATED RINGERS IV BOLUS
1000.0000 mL | Freq: Once | INTRAVENOUS | Status: AC
  Administered 2020-03-15: 1000 mL via INTRAVENOUS

## 2020-03-15 MED ORDER — DEXTROSE IN LACTATED RINGERS 5 % IV SOLN: INTRAVENOUS | Status: DC

## 2020-03-15 MED ORDER — SODIUM CHLORIDE 0.9 % IV SOLN: Freq: Once | INTRAVENOUS | Status: DC

## 2020-03-15 MED ORDER — ACETAMINOPHEN 325 MG PO TABS
650.0000 mg | ORAL_TABLET | Freq: Four times a day (QID) | ORAL | Status: DC | PRN
  Administered 2020-03-18 – 2020-03-20 (×6): 650 mg via ORAL
  Filled 2020-03-15 (×6): qty 2

## 2020-03-15 MED ORDER — ENOXAPARIN SODIUM 40 MG/0.4ML ~~LOC~~ SOLN
40.0000 mg | SUBCUTANEOUS | Status: DC
  Filled 2020-03-15: qty 0.4

## 2020-03-15 MED ORDER — ONDANSETRON HCL 4 MG PO TABS: 4.0000 mg | ORAL_TABLET | Freq: Four times a day (QID) | ORAL | Status: DC | PRN

## 2020-03-15 MED ORDER — ONDANSETRON HCL 4 MG/2ML IJ SOLN
4.0000 mg | Freq: Four times a day (QID) | INTRAMUSCULAR | Status: DC | PRN
  Administered 2020-03-21: 09:00:00 4 mg via INTRAVENOUS
  Filled 2020-03-15 (×2): qty 2

## 2020-03-15 MED ORDER — LACTATED RINGERS IV BOLUS (SEPSIS)
500.0000 mL | Freq: Once | INTRAVENOUS | Status: AC
  Administered 2020-03-15: 500 mL via INTRAVENOUS

## 2020-03-15 MED ORDER — IRBESARTAN 150 MG PO TABS: 150.0000 mg | ORAL_TABLET | Freq: Every day | ORAL | Status: DC

## 2020-03-15 NOTE — ED Triage Notes (Addendum)
First nurse note- chemo yesterday, febrile today 101.9, tachy 120 with EMS.  Arrived [facility]. PIV by EMS.  1000 ml by EMS, 4 mg zofran, tylenol 1000mg  by EMS 5 min PTA.  Abdomen distended per EMS

## 2020-03-15 NOTE — ED Notes (Signed)
Verbal given by Dr. Archie Balboa for 1L [place] bolus, 2g cefepime IV stat, 1 g vancomycin IV stat

## 2020-03-15 NOTE — Progress Notes (Signed)
CODE SEPSIS - PHARMACY COMMUNICATION  **Broad Spectrum Antibiotics should be administered within 1 hour of Sepsis diagnosis**  Time Code Sepsis Called/Page Received:  7/30 @ 2019  Antibiotics Ordered: Cefepime , Vancomycin   Time of 1st antibiotic administration: Cefepime 2 gm IV X 1 on 7/30 @ 1813   Additional action taken by pharmacy:   If necessary, Name of Provider/Nurse Contacted:     Martine Trageser D ,PharmD Clinical Pharmacist  03/15/2020  9:02 PM

## 2020-03-15 NOTE — Telephone Encounter (Signed)
Son Geoffery Spruce called reporting that patient fell and Is on the way to the ER via EMS and that she has been very weak since her treatment yesterday

## 2020-03-15 NOTE — ED Triage Notes (Signed)
See first nurse note. Pt sitting in recliner in NAD, pt reports she is not really sure why she is here but just feels tired and sleepy. PT skin warm and dry. No shob observed

## 2020-03-15 NOTE — ED Provider Notes (Signed)
[facility] Emergency Department Provider Note  ____________________________________________   First MD Initiated Contact with Patient 03/15/20 1909     (approximate)  I have reviewed the triage vital signs and the nursing notes.   HISTORY  Chief Complaint Fever   HPI Tamara Mckinney is a 86 y.o. female with past medical history of ovarian cancer currently undergoing chemotherapy, anemia, arthritis, asthma, GERD, and HTN who presents via EMS after reportedly developing a fever today.  Patient states she does not know she was febrile and states she was sent to the ED to get checked out.  She denies any headache, earache, sore throat, chest pain, shortness of breath, vomiting, diarrhea, dysuria, extremity pain, rash, recent injuries.  She endorses a chronic intermittent cough on chronic but progressive generalized abdominal pain.  No other clear precipitating events regarding reported fever earlier today or other acute symptoms per patient.         Past Medical History:  Diagnosis Date  . Anemia   . Arthritis   . Asthma   . Dyspnea   . GERD (gastroesophageal reflux disease)   . Hypertension   . Ovarian cancer [facility]) 2018    Patient Active Problem List   Diagnosis Date Noted  . Sepsis ([place]) 03/15/2020  . Hyponatremia 03/15/2020  . Leucocytosis 03/15/2020  . Cataract 08/25/2018  . DDD (degenerative disc disease), lumbar 08/25/2018  . Fibromyalgia 08/25/2018  . Scoliosis 08/25/2018  . Goals of care, counseling/discussion 05/29/2017  . Malignant neoplasm of ovary ([place]) 05/23/2017  . Acute posthemorrhagic anemia 05/19/2017  . Acute renal failure (ARF) ([place]) 05/19/2017  . Anemia 05/19/2017  . Rectal prolapse 05/11/2017  . Essential hypertension 04/10/2016  . Sjogren's disease ([place]) 04/10/2016  . Esophageal reflux 07/17/2014  . Rhinitis, allergic 07/17/2014  . Rupture of right biceps tendon 06/13/2014  . Asthma 06/12/2013  . Osteoporosis  11/21/2012  . Cystocele 06/08/2011  . Urinary incontinence, mixed 05/22/2011    Past Surgical History:  Procedure Laterality Date  . ABDOMINAL HYSTERECTOMY    . BREAST BIOPSY     x6  . BUNIONECTOMY Bilateral   . CATARACT EXTRACTION, BILATERAL    . FLEXIBLE SIGMOIDOSCOPY N/A 05/21/2017   Procedure: FLEXIBLE SIGMOIDOSCOPY;  Surgeon: Lin Landsman, MD;  Location: [facility] ENDOSCOPY;  Service: Gastroenterology;  Laterality: N/A;  . INCONTINENCE SURGERY    . PORTA CATH INSERTION N/A 06/16/2017   Procedure: PORTA CATH INSERTION;  Surgeon: Algernon Huxley, MD;  Location: [place] CV LAB;  Service: Cardiovascular;  Laterality: N/A;  . RECTAL SURGERY  04/2018  . REPAIR OF RECTAL PROLAPSE N/A 05/11/2017   Procedure: REPAIR OF RECTAL PROLAPSE;  Surgeon: Leonie Green, MD;  Location: [facility] ORS;  Service: General;  Laterality: N/A;  . TONSILLECTOMY    . VAGINA SURGERY     uncertain procedure performed    Prior to Admission medications   Medication Sig Start Date End Date Taking? Authorizing Provider  acetaminophen (TYLENOL) 500 MG tablet Take 500-1,000 mg by mouth every 6 (six) hours as needed for mild pain, moderate pain or fever.    Yes [provider]  cyclobenzaprine (FLEXERIL) 10 MG tablet TAKE ONE TABLET BY MOUTH THREE TIMES A DAY AS NEEDED Patient taking differently: Take 10 mg by mouth 3 (three) times daily as needed for muscle spasms.  12/29/19  Yes Lloyd Huger, MD  esomeprazole (NEXIUM) 40 MG capsule Take 40 mg by mouth daily as needed (GERD symptoms).    Yes [provider]  lidocaine-prilocaine (EMLA) cream APPLY A SMALL AMOUNT TO PORT SITE AT LEAST 1 HOUR PRIOR TO IT BEING ACCESSED THEN COVER WITH PLASTIC WRAP 01/12/19  Yes Lloyd Huger, MD  montelukast (SINGULAIR) 10 MG tablet Take 10 mg by mouth at bedtime as needed (allergy symptoms).    Yes [provider]  polyethylene glycol (MIRALAX / GLYCOLAX) packet Take 17 g by mouth daily as  needed for mild constipation or moderate constipation.    Yes [provider]  valsartan (DIOVAN) 160 MG tablet Take 160 mg by mouth daily.    Yes [provider]    Allergies Patient has no known allergies.  Family History  Problem Relation Age of Onset  . Heart disease Mother   . Heart disease Father   . Heart disease Sister   . Heart attack Sister   . Ulcerative colitis Brother   . Lung cancer Brother   . Thyroid cancer Sister   . Asthma Sister   . Diabetes Sister   . Asthma Sister   . Pancreatic cancer Sister   . Dementia Sister   . Asthma Brother   . Heart disease Brother   . Asthma Brother   . Lung cancer Brother   . Asthma Brother   . Lung cancer Brother   . Lung cancer Brother   . Rheum arthritis Brother     Social History Social History   Tobacco Use  . Smoking status: Never Smoker  . Smokeless tobacco: Never Used  Vaping Use  . Vaping Use: Never used  Substance Use Topics  . Alcohol use: No  . Drug use: No    Review of Systems  Review of Systems  Constitutional: Positive for chills and malaise/fatigue. Negative for fever.  HENT: Negative for sore throat.   Eyes: Negative for pain.  Respiratory: Positive for cough. Negative for stridor.   Cardiovascular: Negative for chest pain.  Gastrointestinal: Positive for abdominal pain and nausea. Negative for vomiting.  Skin: Negative for rash.  Neurological: Negative for seizures, loss of consciousness and headaches.  Psychiatric/Behavioral: Negative for suicidal ideas.  All other systems reviewed and are negative.     ____________________________________________   PHYSICAL EXAM:  VITAL SIGNS: ED Triage Vitals  Enc Vitals Group     BP 03/15/20 1731 (!) 101/50     Pulse Rate 03/15/20 1731 (!) 115     Resp 03/15/20 1731 20     Temp 03/15/20 1731 (!) 102.3 F (39.1 C)     Temp Source 03/15/20 1731 Oral     SpO2 03/15/20 1731 95 %     Weight 03/15/20 1733 (!) 95 lb (43.1 kg)      Height 03/15/20 1733 5\' 1"  (1.549 m)     Head Circumference --      Peak Flow --      Pain Score 03/15/20 1732 0     Pain Loc --      Pain Edu? --      Excl. in [place]? --    Vitals:   03/15/20 2300 03/15/20 2323  BP: 121/68 121/68  Pulse: 91 96  Resp:  19  Temp:    SpO2: 97% 100%   Physical Exam Vitals and nursing note reviewed.  Constitutional:      General: She is not in acute distress.    Appearance: She is well-developed.  HENT:     Head: Normocephalic and atraumatic.     Right Ear: External ear normal.  Left Ear: External ear normal.     Nose: Nose normal.     Mouth/Throat:     Mouth: Mucous membranes are dry.  Eyes:     Conjunctiva/sclera: Conjunctivae normal.  Cardiovascular:     Rate and Rhythm: Regular rhythm. Tachycardia present.     Heart sounds: No murmur heard.   Pulmonary:     Effort: Pulmonary effort is normal. No respiratory distress.     Breath sounds: Normal breath sounds.  Abdominal:     General: There is distension.     Palpations: Abdomen is soft.     Tenderness: There is abdominal tenderness.  Musculoskeletal:     Cervical back: Neck supple.  Skin:    General: Skin is warm and dry.  Neurological:     General: No focal deficit present.     Mental Status: She is alert.  Psychiatric:        Mood and Affect: Mood normal.      ____________________________________________   LABS (all labs ordered are listed, but only abnormal results are displayed)  Labs Reviewed  COMPREHENSIVE METABOLIC PANEL - Abnormal; Notable for the following components:      Result Value   Sodium 129 (*)    CO2 19 (*)    Glucose, Bld 120 (*)    BUN 37 (*)    Creatinine, Ser 1.02 (*)    Calcium 7.7 (*)    Total Protein 5.6 (*)    Albumin 2.8 (*)    AST 149 (*)    ALT 74 (*)    GFR calc non Af Amer 48 (*)    GFR calc Af Amer 56 (*)    All other components within normal limits  CBC WITH DIFFERENTIAL/PLATELET - Abnormal; Notable for the following components:    WBC 18.8 (*)    RBC 2.37 (*)    Hemoglobin 7.4 (*)    HCT 21.5 (*)    RDW 16.8 (*)    Neutro Abs 17.9 (*)    Lymphs Abs 0.2 (*)    Abs Immature Granulocytes 0.36 (*)    All other components within normal limits  PROTIME-INR - Abnormal; Notable for the following components:   Prothrombin Time 15.9 (*)    INR 1.3 (*)    All other components within normal limits  URINALYSIS, COMPLETE (UACMP) WITH MICROSCOPIC - Abnormal; Notable for the following components:   Color, Urine YELLOW (*)    APPearance HAZY (*)    Glucose, UA 50 (*)    Hgb urine dipstick MODERATE (*)    Protein, ur 30 (*)    All other components within normal limits  SARS CORONAVIRUS 2 BY RT PCR (HOSPITAL ORDER, [place])  CULTURE, BLOOD (ROUTINE X 2)  CULTURE, BLOOD (ROUTINE X 2)  URINE CULTURE  LACTIC ACID, PLASMA  LACTIC ACID, PLASMA  PROTIME-INR  CORTISOL-AM, BLOOD  PROCALCITONIN  COMPREHENSIVE METABOLIC PANEL  CBC  SAMPLE TO BLOOD BANK  PREPARE RBC (CROSSMATCH)   ____________________________________________  ____________________________________________  RADIOLOGY   Official radiology report(s): CT ABDOMEN PELVIS W CONTRAST  Result Date: 03/15/2020 CLINICAL DATA:  Abdominal abscess or infection suspected. EXAM: CT ABDOMEN AND PELVIS WITH CONTRAST TECHNIQUE: Multidetector CT imaging of the abdomen and pelvis was performed using the standard protocol following bolus administration of intravenous contrast. CONTRAST:  50mL OMNIPAQUE IOHEXOL 300 MG/ML  SOLN COMPARISON:  November 14, 2019 FINDINGS: Lower chest: Hiatal hernia similar to prior study. No consolidation. No pleural effusion. Hepatobiliary: New hepatic  masses. (Image 19, series 2) 5.2 x 4.7 cm. This is in the RIGHT hemi liver. And RIGHT hepatic vein and portal veins can be seen passing through this area. Another lesion measuring 2.4 cm in the anterior RIGHT hemi liver (image 15, series 2) 2 additional foci in the central RIGHT  liver and 2 more near the dome of the RIGHT hemi liver of similar size. These are low-density and septate. Surrounding edema in the liver is not appreciated. No significant biliary duct dilation or pericholecystic stranding. Pancreas: Pancreas is largely encased in the distal splenorenal ligament by tumor implant that is now more confluent than on the previous study. This measures approximately 7.7 x 5.0 cm as compared to 9.3 x 5.5 cm. This shows some small locules of gas within the lateral portion of the tumor on image 16 of series 2, this is of uncertain significance adjacent to the splenic flexure. Spleen: Splenic hilum infiltrated by tumor with similar appearance as described. Adrenals/Urinary Tract: RIGHT adrenal lesion unchanged. Symmetric renal enhancement. No hydronephrosis. Urinary is distended and extends below the pelvic floor. Stomach/Bowel: Postoperative changes about the rectum. Individual bowel loops are difficult to assess due to extensive tumor in the abdomen mildly dilated and filled with stool like material small-bowel loops are pushed into the RIGHT hemiabdomen. The appendix is normal. Vascular/Lymphatic: Tortuous abdominal aorta without aneurysmal dilation with calcified atheromatous plaques similar to the previous study. No retroperitoneal lymphadenopathy. No pelvic lymphadenopathy. Reproductive: Post hysterectomy. Signs of pelvic floor dysfunction as described. Other: No free air. LEFT retroperitoneal tumor implant is similar to the prior study. Musculoskeletal: No acute bone finding. Spinal degenerative changes and scoliotic curvature of the spine with similar appearance to previous imaging. IMPRESSION: 1. New hepatic masses that are suspicious for metastatic disease with some atypical features given that vessels can be seen passing through these areas and that they are seen in the setting of tumor with peritoneal predilection, less commonly associated with hepatic parenchymal disease though  this could be seen in the setting of advanced ovarian cancer. In the setting of fever, hepatic abscesses should be considered. Correlate with any recent antibiotic administration as an outpatient that may explain the lack of edema. Ultimately sampling of these areas may be helpful to differentiate. 2. Decrease in size of some areas but with gas in the area of tumor in the splenorenal ligament, this raises the question of secondary infection or gas related to necrosis. 3. Enlarging dominant lesion in the anterior abdomen with displacement of numerous bowel loops. Aortic Atherosclerosis (ICD10-I70.0). Electronically Signed   By: Zetta Bills M.D.   On: 03/15/2020 20:07   DG Chest Port 1 View  Result Date: 03/15/2020 CLINICAL DATA:  Fever.  Chemotherapy yesterday. EXAM: PORTABLE CHEST 1 VIEW COMPARISON:  No prior chest imaging. FINDINGS: Right chest port remains in place. Upper normal heart size with retrocardiac hiatal hernia. No focal airspace disease. No pleural effusion or pneumothorax. No evidence of pulmonary edema. Scoliotic curvature of the spine without acute osseous abnormalities. IMPRESSION: 1. No acute chest findings. 2. Hiatal hernia. Electronically Signed   By: Keith Rake M.D.   On: 03/15/2020 19:52    ____________________________________________   PROCEDURES  Procedure(s) performed (including Critical Care):  Procedures   ____________________________________________   INITIAL IMPRESSION / ASSESSMENT AND PLAN / ED COURSE        Overall patient's history, exam, and ED work-up is concerning for for sepsis of unclear source possibly in the liver as noted on above CT versus  other source.  No clear evidence of pneumonia on chest x-ray and UA does not appear infected.  No evidence of exam of cellulitis or other clear source for patient's infection.  Lactic acid is not elevated given concern for sepsis patient was given broad-spectrum antibiotics, IV fluids, and tylenol.  Of note  patient is noted to be severely anemic her recent hemoglobins have all been in the 8 range.  In addition patient is noted to be hyponatremic although she is chronically hyponatremic and she is not from her baseline.  Will plan to admit to medicine service for further evaluation management.   Medications  enoxaparin (LOVENOX) injection 40 mg (has no administration in time range)  lactated ringers bolus 1,000 mL (has no administration in time range)    And  lactated ringers bolus 500 mL (has no administration in time range)  dextrose 5 % in lactated ringers infusion (has no administration in time range)  ceFEPIme (MAXIPIME) 2 g in sodium chloride 0.9 % 100 mL IVPB (has no administration in time range)  metroNIDAZOLE (FLAGYL) IVPB 500 mg (has no administration in time range)  vancomycin (VANCOCIN) IVPB 1000 mg/200 mL premix (has no administration in time range)  ondansetron (ZOFRAN) tablet 4 mg (has no administration in time range)    Or  ondansetron (ZOFRAN) injection 4 mg (has no administration in time range)  acetaminophen (TYLENOL) tablet 650 mg (has no administration in time range)    Or  acetaminophen (TYLENOL) suppository 650 mg (has no administration in time range)  0.9 %  sodium chloride infusion (has no administration in time range)  vancomycin (VANCOREADY) IVPB 500 mg/100 mL (has no administration in time range)  ceFEPIme (MAXIPIME) 2 g in sodium chloride 0.9 % 100 mL IVPB (has no administration in time range)  sodium chloride 0.9 % bolus 1,000 mL (0 mLs Intravenous Stopped 03/15/20 2041)  ceFEPIme (MAXIPIME) 2 g in sodium chloride 0.9 % 100 mL IVPB (0 g Intravenous Stopped 03/15/20 2040)  vancomycin (VANCOCIN) IVPB 1000 mg/200 mL premix (0 mg Intravenous Stopped 03/15/20 2040)  iohexol (OMNIPAQUE) 300 MG/ML solution 75 mL (75 mLs Intravenous Contrast Given 03/15/20 1929)  lactated ringers bolus 1,000 mL (1,000 mLs Intravenous New Bag/Given 03/15/20 2049)              ____________________________________________   FINAL CLINICAL IMPRESSION(S) / ED DIAGNOSES  Final diagnoses:  Sepsis, due to unspecified organism, unspecified whether acute organ dysfunction present ([place])  Low hemoglobin  Hyponatremia  Cancer [facility])     ED Discharge Orders    None       Note:  This document was prepared using Dragon voice recognition software and may include unintentional dictation errors.   Lucrezia Starch, MD 03/15/20 [phone]

## 2020-03-15 NOTE — ED Notes (Signed)
Pt to CT at this time.

## 2020-03-15 NOTE — Progress Notes (Signed)
Pharmacy Antibiotic Note  Tamara Mckinney is a 86 y.o. female admitted on 03/15/2020 with sepsis.  Pharmacy has been consulted for Vancomycin, Cefepime dosing.  Plan: Vancomycin 1 gm IV X 1 given on 7/30 @ 1900. Vancomycin 500 mg IV Q24H ordered to start on 7/31 @ 1900. No trough currently ordered.   Cefepime 2 gm IV X 1 given in ED on 7/30 @ 1800.  Cefepime 2 gm IV Q24H ordered to continue on 7/31 @ 1800.  Height: 5\' 1"  (154.9 cm) Weight: (!) 43.1 kg (95 lb) IBW/kg (Calculated) : 47.8  Temp (24hrs), Avg:102.3 F (39.1 C), Min:102.3 F (39.1 C), Max:102.3 F (39.1 C)  Recent Labs  Lab 03/14/20 1005 03/15/20 1748 03/15/20 2042  WBC 18.5* 18.8*  --   CREATININE 0.97 1.02*  --   LATICACIDVEN  --  1.6 1.2    Estimated Creatinine Clearance: 24.9 mL/min (A) (by C-G formula based on SCr of 1.02 mg/dL (H)).    No Known Allergies  Antimicrobials this admission:   >>    >>   Dose adjustments this admission:   Microbiology results:  BCx:   UCx:    Sputum:    MRSA PCR:   Thank you for allowing pharmacy to be a part of this patient's care.  Loralyn Rachel D 03/15/2020 10:51 PM

## 2020-03-15 NOTE — H&P (Signed)
History and Physical   Tamara Mckinney ERD:[id number] DOB: [birth year] DOA: 03/15/2020  Referring MD/NP/PA: Dr. Tamala Mckinney  PCP: Tamara Pitch, MD   Outpatient Specialists: Tamara Mckinney, oncology  Patient coming from: Home  Chief Complaint: Fever  HPI: Tamara Mckinney is a 86 y.o. female with medical history significant of ovarian cancer on chemotherapy, GERD, hypertension, asthma, lupus anticoagulant osteoarthritis, anemia of chronic disease, who was seen yesterday with her cycle 6 chemotherapy and has been consistently doing fine until this morning when she started having fever.  She has stage IIIc cancer.  She continues to have fatigue and weakness throughout chemotherapy but now the fever brought her to the emergency room where she was found to be septic.  No obvious source of infection at the moment.  She is not neutropenic.  Patient is awake and alert.  Blood pressure is stable.  Due to her relative immunocompromise status and ongoing fever she is being admitted for continued work-up.  She was also noted to be anemic with hemoglobin dropping to 7.2 from 8.6 only yesterday.  She feels weak.  She has previously had neutropenia from chemotherapy but not today.  She has had rectal prolapse and has had rectal bleeding lately on and off.  Patient being admitted with febrile illness probably secondary to chemotherapy...  ED Course: Temperature is 102.3 blood pressure 101/50 pulse 150 respirate of 20 oxygen sat 95% room air.  White count 18.8 hemoglobin 7.4 platelets 306 sodium 129 potassium 3.2 chloride 101 CO2 19 BUN 37 creatinine 1.02 calcium 7.7 INR 1.3 and glucose 120.  Chest x-ray showed no acute findings.  Urinalysis essentially negative for obvious infection.  CT abdomen pelvis shows new hepatic masses probably metastatic disease.  Questionable hepatic abscesses in the setting of her fever if no known causes.  Also her previous ovarian cancer noted.  She is being admitted with sepsis probably due to  intra-abdominal infection.  Review of Systems: As per HPI otherwise 10 point review of systems negative.    Past Medical History:  Diagnosis Date  . Anemia   . Arthritis   . Asthma   . Dyspnea   . GERD (gastroesophageal reflux disease)   . Hypertension   . Ovarian cancer ([place]) 2018    Past Surgical History:  Procedure Laterality Date  . ABDOMINAL HYSTERECTOMY    . BREAST BIOPSY     x6  . BUNIONECTOMY Bilateral   . CATARACT EXTRACTION, BILATERAL    . FLEXIBLE SIGMOIDOSCOPY N/A 05/21/2017   Procedure: FLEXIBLE SIGMOIDOSCOPY;  Surgeon: Tamara Landsman, MD;  Location[facility] ENDOSCOPY;  Service: Gastroenterology;  Laterality: N/A;  . INCONTINENCE SURGERY    . PORTA CATH INSERTION N/A 06/16/2017   Procedure: PORTA CATH INSERTION;  Surgeon: Tamara Huxley, MD;  Location: [facility] LAB;  Service: Cardiovascular;  Laterality: N/A;  . RECTAL SURGERY  04/2018  . REPAIR OF RECTAL PROLAPSE N/A 05/11/2017   Procedure: REPAIR OF RECTAL PROLAPSE;  Surgeon: Tamara Green, MD;  Location: [facility] ORS;  Service: General;  Laterality: N/A;  . TONSILLECTOMY    . VAGINA SURGERY     uncertain procedure performed     reports that she has never smoked. She has never used smokeless tobacco. She reports that she does not drink alcohol and does not use drugs.  No Known Allergies  Family History  Problem Relation Age of Onset  . Heart disease Mother   . Heart disease Father   . Heart disease Sister   .  Heart attack Sister   . Ulcerative colitis Brother   . Lung cancer Brother   . Thyroid cancer Sister   . Asthma Sister   . Diabetes Sister   . Asthma Sister   . Pancreatic cancer Sister   . Dementia Sister   . Asthma Brother   . Heart disease Brother   . Asthma Brother   . Lung cancer Brother   . Asthma Brother   . Lung cancer Brother   . Lung cancer Brother   . Rheum arthritis Brother      Prior to Admission medications   Medication Sig Start Date End Date Taking?  Authorizing Provider  acetaminophen (TYLENOL) 500 MG tablet Take 500 mg by mouth every 6 (six) hours as needed.    [provider]  cyclobenzaprine (FLEXERIL) 10 MG tablet TAKE ONE TABLET BY MOUTH THREE TIMES A DAY AS NEEDED 12/29/19   Tamara Huger, MD  esomeprazole (NEXIUM) 40 MG capsule Take 40 mg by mouth daily before breakfast.     [provider]  lidocaine-prilocaine (EMLA) cream APPLY A SMALL AMOUNT TO PORT SITE AT LEAST 1 HOUR PRIOR TO IT BEING ACCESSED THEN COVER WITH PLASTIC WRAP 01/12/19   Tamara Huger, MD  montelukast (SINGULAIR) 10 MG tablet Take 10 mg by mouth at bedtime.    [provider]  ondansetron (ZOFRAN) 8 MG tablet Take 1 tablet (8 mg total) by mouth 2 (two) times daily as needed for nausea or vomiting. 10/20/18   Tamara Huger, MD  polyethylene glycol (MIRALAX / GLYCOLAX) packet Take 17 g by mouth daily as needed.     [provider]  traMADol (ULTRAM) 50 MG tablet Take 1 tablet (50 mg total) by mouth every 6 (six) hours as needed. 12/27/19   Tamara Huger, MD  valsartan (DIOVAN) 160 MG tablet Start 1/2 tab a day.  Can increase to 1 tab after a few weeks if BP stays high 06/20/18   [provider]    Physical Exam: Vitals:   03/15/20 1731 03/15/20 1733  BP: (!) 101/50   Pulse: (!) 115   Resp: 20   Temp: (!) 102.3 F (39.1 C)   TempSrc: Oral   SpO2: 95%   Weight:  (!) 43.1 kg  Height:  5\' 1"  (1.549 m)      Constitutional: Chronically ill looking, pleasant no acute distress Vitals:   03/15/20 1731 03/15/20 1733  BP: (!) 101/50   Pulse: (!) 115   Resp: 20   Temp: (!) 102.3 F (39.1 C)   TempSrc: Oral   SpO2: 95%   Weight:  (!) 43.1 kg  Height:  5\' 1"  (1.549 m)   Eyes: PERRL, lids and conjunctivae pale ENMT: Mucous membranes are moist. Posterior pharynx clear of any exudate or lesions.Normal dentition.  Neck: normal, supple, no masses, no thyromegaly Respiratory: clear to auscultation  bilaterally, no wheezing, no crackles. Normal respiratory effort. No accessory muscle use.  Cardiovascular: Sinus tachycardia, no murmurs / rubs / gallops. No extremity edema. 2+ pedal pulses. No carotid bruits.  Abdomen: Distended abdomen, palpable mass in the suprapubic area, no tenderness, . Bowel sounds positive.  Musculoskeletal: no clubbing / cyanosis. No joint deformity upper and lower extremities. Good ROM, no contractures. Normal muscle tone.  Skin: no rashes, lesions, ulcers. No induration Neurologic: CN 2-12 grossly intact. Sensation intact, DTR normal. Strength 5/5 in all 4.  Psychiatric: Normal judgment and insight. Alert and oriented x 3. Normal mood.  Labs on Admission: I have personally reviewed following labs and imaging studies  CBC: Recent Labs  Lab 03/14/20 1005 03/15/20 1748  WBC 18.5* 18.8*  NEUTROABS 16.4* 17.9*  HGB 8.9* 7.4*  HCT 26.7* 21.5*  MCV 93.7 90.7  PLT 518* 500   Basic Metabolic Panel: Recent Labs  Lab 03/14/20 1005 03/15/20 1748  NA 129* 129*  K 4.5 3.8  CL 96* 101  CO2 24 19*  GLUCOSE 139* 120*  BUN 26* 37*  CREATININE 0.97 1.02*  CALCIUM 8.5* 7.7*   GFR: Estimated Creatinine Clearance: 24.9 mL/min (A) (by C-G formula based on SCr of 1.02 mg/dL (H)). Liver Function Tests: Recent Labs  Lab 03/14/20 1005 03/15/20 1748  AST 28 149*  ALT 14 74*  ALKPHOS 77 86  BILITOT 0.7 0.7  PROT 6.6 5.6*  ALBUMIN 3.6 2.8*   No results for input(s): LIPASE, AMYLASE in the last 168 hours. No results for input(s): AMMONIA in the last 168 hours. Coagulation Profile: Recent Labs  Lab 03/15/20 1748  INR 1.3*   Cardiac Enzymes: No results for input(s): CKTOTAL, CKMB, CKMBINDEX, TROPONINI in the last 168 hours. BNP (last 3 results) No results for input(s): PROBNP in the last 8760 hours. HbA1C: No results for input(s): HGBA1C in the last 72 hours. CBG: No results for input(s): GLUCAP in the last 168 hours. Lipid Profile: No results for  input(s): CHOL, HDL, LDLCALC, TRIG, CHOLHDL, LDLDIRECT in the last 72 hours. Thyroid Function Tests: No results for input(s): TSH, T4TOTAL, FREET4, T3FREE, THYROIDAB in the last 72 hours. Anemia Panel: No results for input(s): VITAMINB12, FOLATE, FERRITIN, TIBC, IRON, RETICCTPCT in the last 72 hours. Urine analysis: No results found for: COLORURINE, APPEARANCEUR, LABSPEC, PHURINE, GLUCOSEU, HGBUR, BILIRUBINUR, KETONESUR, PROTEINUR, UROBILINOGEN, NITRITE, LEUKOCYTESUR Sepsis Labs: @LABRCNTIP (procalcitonin:4,lacticidven:4) )No results found for this or any previous visit (from the past 240 hour(s)).   Radiological Exams on Admission: CT ABDOMEN PELVIS W CONTRAST  Result Date: 03/15/2020 CLINICAL DATA:  Abdominal abscess or infection suspected. EXAM: CT ABDOMEN AND PELVIS WITH CONTRAST TECHNIQUE: Multidetector CT imaging of the abdomen and pelvis was performed using the standard protocol following bolus administration of intravenous contrast. CONTRAST:  51mL OMNIPAQUE IOHEXOL 300 MG/ML  SOLN COMPARISON:  November 14, 2019 FINDINGS: Lower chest: Hiatal hernia similar to prior study. No consolidation. No pleural effusion. Hepatobiliary: New hepatic masses. (Image 19, series 2) 5.2 x 4.7 cm. This is in the RIGHT hemi liver. And RIGHT hepatic vein and portal veins can be seen passing through this area. Another lesion measuring 2.4 cm in the anterior RIGHT hemi liver (image 15, series 2) 2 additional foci in the central RIGHT liver and 2 more near the dome of the RIGHT hemi liver of similar size. These are low-density and septate. Surrounding edema in the liver is not appreciated. No significant biliary duct dilation or pericholecystic stranding. Pancreas: Pancreas is largely encased in the distal splenorenal ligament by tumor implant that is now more confluent than on the previous study. This measures approximately 7.7 x 5.0 cm as compared to 9.3 x 5.5 cm. This shows some small locules of gas within the lateral  portion of the tumor on image 16 of series 2, this is of uncertain significance adjacent to the splenic flexure. Spleen: Splenic hilum infiltrated by tumor with similar appearance as described. Adrenals/Urinary Tract: RIGHT adrenal lesion unchanged. Symmetric renal enhancement. No hydronephrosis. Urinary is distended and extends below the pelvic floor. Stomach/Bowel: Postoperative changes about the rectum. Individual bowel loops are difficult to  assess due to extensive tumor in the abdomen mildly dilated and filled with stool like material small-bowel loops are pushed into the RIGHT hemiabdomen. The appendix is normal. Vascular/Lymphatic: Tortuous abdominal aorta without aneurysmal dilation with calcified atheromatous plaques similar to the previous study. No retroperitoneal lymphadenopathy. No pelvic lymphadenopathy. Reproductive: Post hysterectomy. Signs of pelvic floor dysfunction as described. Other: No free air. LEFT retroperitoneal tumor implant is similar to the prior study. Musculoskeletal: No acute bone finding. Spinal degenerative changes and scoliotic curvature of the spine with similar appearance to previous imaging. IMPRESSION: 1. New hepatic masses that are suspicious for metastatic disease with some atypical features given that vessels can be seen passing through these areas and that they are seen in the setting of tumor with peritoneal predilection, less commonly associated with hepatic parenchymal disease though this could be seen in the setting of advanced ovarian cancer. In the setting of fever, hepatic abscesses should be considered. Correlate with any recent antibiotic administration as an outpatient that may explain the lack of edema. Ultimately sampling of these areas may be helpful to differentiate. 2. Decrease in size of some areas but with gas in the area of tumor in the splenorenal ligament, this raises the question of secondary infection or gas related to necrosis. 3. Enlarging dominant  lesion in the anterior abdomen with displacement of numerous bowel loops. Aortic Atherosclerosis (ICD10-I70.0). Electronically Signed   By: Zetta Bills M.D.   On: 03/15/2020 20:07   DG Chest Port 1 View  Result Date: 03/15/2020 CLINICAL DATA:  Fever.  Chemotherapy yesterday. EXAM: PORTABLE CHEST 1 VIEW COMPARISON:  No prior chest imaging. FINDINGS: Right chest port remains in place. Upper normal heart size with retrocardiac hiatal hernia. No focal airspace disease. No pleural effusion or pneumothorax. No evidence of pulmonary edema. Scoliotic curvature of the spine without acute osseous abnormalities. IMPRESSION: 1. No acute chest findings. 2. Hiatal hernia. Electronically Signed   By: Keith Rake M.D.   On: 03/15/2020 19:52    EKG: Independently reviewed.  Sinus tachycardia  Assessment/Plan Principal Problem:   Sepsis ([place]) Active Problems:   Anemia   Malignant neoplasm of ovary ([facility])   Asthma   Esophageal reflux   Essential hypertension   Fibromyalgia   Sjogren's disease ([place])   Hyponatremia   Leucocytosis     #1 sepsis: Most likely due to intra-abdominal infection or occult bacteremia from patient is immunocompromise status as a result of chemotherapy.  She is not neutropenic actually she is having leukocytosis.  We will empirically start her on IV vancomycin and cefepime.  Obtain blood cultures.  Urine cultures also.  Follow culture results.  If fever persists infectious disease consult to evaluate her liver lesions for possible abscesses instead of metastatic disease.  #2  Symptomatic anemia: Patient has remained weak.  Overnight she has dropped 1 g of hemoglobin.  She has had some rectal bleed which could be the cause but this was presumed due to prolapse and hemorrhoids.  I will transfuse 1 unit of packed red blood cells.  Monitor closely.  #3 ovarian cancer: Continue treatment per oncology.  #4 essential hypertension: Continue blood pressure control.  #5 GERD:  Continue with PPIs.  #6 hyponatremia: Probably dehydration.  Hydrate patient and monitor  #7 history of asthma: No exacerbation.    DVT prophylaxis: Lovenox Code Status: Full code Family Communication: Granddaughter with patient at bedside Disposition Plan: To be determined Consults called: None but oncology can be consulted in the morning Admission status: Inpatient  Severity of Illness: The appropriate patient status for this patient is INPATIENT. Inpatient status is judged to be reasonable and necessary in order to provide the required intensity of service to ensure the patient's safety. The patient's presenting symptoms, physical exam findings, and initial radiographic and laboratory data in the context of their chronic comorbidities is felt to place them at high risk for further clinical deterioration. Furthermore, it is not anticipated that the patient will be medically stable for discharge from the hospital within 2 midnights of admission. The following factors support the patient status of inpatient.   " The patient's presenting symptoms include fever. " The worrisome physical exam findings include mild tachycardia and abdominal mass. " The initial radiographic and laboratory data are worrisome because of hemoglobin 7.4. " The chronic co-morbidities include metastatic ovarian cancer.   * I certify that at the point of admission it is my clinical judgment that the patient will require inpatient hospital care spanning beyond 2 midnights from the point of admission due to high intensity of service, high risk for further deterioration and high frequency of surveillance required.Barbette Merino MD [facility] Pager [phone]  If 7PM-7AM, please contact night-coverage [email or web address] Password [facility]  03/15/2020, 8:23 PM

## 2020-03-16 ENCOUNTER — Other Ambulatory Visit: Payer: Self-pay

## 2020-03-16 ENCOUNTER — Inpatient Hospital Stay: Payer: Medicare PPO

## 2020-03-16 ENCOUNTER — Encounter: Payer: Self-pay | Admitting: Internal Medicine

## 2020-03-16 ENCOUNTER — Telehealth: Payer: Self-pay | Admitting: Internal Medicine

## 2020-03-16 DIAGNOSIS — C569 Malignant neoplasm of unspecified ovary: Secondary | ICD-10-CM

## 2020-03-16 DIAGNOSIS — E43 Unspecified severe protein-calorie malnutrition: Secondary | ICD-10-CM

## 2020-03-16 DIAGNOSIS — J9601 Acute respiratory failure with hypoxia: Secondary | ICD-10-CM

## 2020-03-16 LAB — IRON AND TIBC
Iron: 10 ug/dL — ABNORMAL LOW (ref 28–170)
Saturation Ratios: 5 % — ABNORMAL LOW (ref 10.4–31.8)
TIBC: 221 ug/dL — ABNORMAL LOW (ref 250–450)
UIBC: 211 ug/dL

## 2020-03-16 LAB — COMPREHENSIVE METABOLIC PANEL
ALT: 88 U/L — ABNORMAL HIGH (ref 0–44)
AST: 152 U/L — ABNORMAL HIGH (ref 15–41)
Albumin: 2.6 g/dL — ABNORMAL LOW (ref 3.5–5.0)
Alkaline Phosphatase: 66 U/L (ref 38–126)
Anion gap: 9 (ref 5–15)
BUN: 28 mg/dL — ABNORMAL HIGH (ref 8–23)
CO2: 20 mmol/L — ABNORMAL LOW (ref 22–32)
Calcium: 7.8 mg/dL — ABNORMAL LOW (ref 8.9–10.3)
Chloride: 99 mmol/L (ref 98–111)
Creatinine, Ser: 0.87 mg/dL (ref 0.44–1.00)
GFR calc Af Amer: >60 mL/min (ref >60)
GFR calc non Af Amer: 59 mL/min — ABNORMAL LOW (ref >60)
Glucose, Bld: 112 mg/dL — ABNORMAL HIGH (ref 70–99)
Potassium: 2.9 mmol/L — ABNORMAL LOW (ref 3.5–5.1)
Sodium: 128 mmol/L — ABNORMAL LOW (ref 135–145)
Total Bilirubin: 1.2 mg/dL (ref 0.3–1.2)
Total Protein: 5.2 g/dL — ABNORMAL LOW (ref 6.5–8.1)

## 2020-03-16 LAB — CBC
HCT: 26 % — ABNORMAL LOW (ref 36.0–46.0)
Hemoglobin: 8.5 g/dL — ABNORMAL LOW (ref 12.0–15.0)
MCH: 29 pg (ref 26.0–34.0)
MCHC: 32.7 g/dL (ref 30.0–36.0)
MCV: 88.7 fL (ref 80.0–100.0)
Platelets: 262 10*3/uL (ref 150–400)
RBC: 2.93 MIL/uL — ABNORMAL LOW (ref 3.87–5.11)
RDW: 21 % — ABNORMAL HIGH (ref 11.5–15.5)
WBC: 15.6 10*3/uL — ABNORMAL HIGH (ref 4.0–10.5)
nRBC: 0 % (ref 0.0–0.2)

## 2020-03-16 LAB — FIBRIN DERIVATIVES D-DIMER (ARMC ONLY): Fibrin derivatives D-dimer (ARMC): 5225.27 ng/mL (FEU) — ABNORMAL HIGH (ref 0.00–499.00)

## 2020-03-16 LAB — PROTIME-INR
INR: 1.4 — ABNORMAL HIGH (ref 0.8–1.2)
Prothrombin Time: 16.5 seconds — ABNORMAL HIGH (ref 11.4–15.2)

## 2020-03-16 LAB — GLUCOSE, CAPILLARY: Glucose-Capillary: 107 mg/dL — ABNORMAL HIGH (ref 70–99)

## 2020-03-16 LAB — VITAMIN B12: Vitamin B-12: 421 pg/mL (ref 180–914)

## 2020-03-16 LAB — MRSA PCR SCREENING: MRSA by PCR: NEGATIVE

## 2020-03-16 LAB — BRAIN NATRIURETIC PEPTIDE: B Natriuretic Peptide: 485.6 pg/mL — ABNORMAL HIGH (ref 0.0–100.0)

## 2020-03-16 LAB — PREPARE RBC (CROSSMATCH)

## 2020-03-16 LAB — CORTISOL-AM, BLOOD: Cortisol - AM: 35.5 ug/dL — ABNORMAL HIGH (ref 6.7–22.6)

## 2020-03-16 LAB — PROCALCITONIN: Procalcitonin: 28.4 ng/mL

## 2020-03-16 MED ORDER — SODIUM CHLORIDE 0.9 % IV SOLN
2.0000 g | Freq: Two times a day (BID) | INTRAVENOUS | Status: DC
  Administered 2020-03-16: 2 g via INTRAVENOUS
  Filled 2020-03-16 (×3): qty 2

## 2020-03-16 MED ORDER — IPRATROPIUM-ALBUTEROL 0.5-2.5 (3) MG/3ML IN SOLN
RESPIRATORY_TRACT | Status: AC
  Administered 2020-03-16: 3 mL via RESPIRATORY_TRACT
  Filled 2020-03-16: qty 6

## 2020-03-16 MED ORDER — ACETAMINOPHEN 10 MG/ML IV SOLN
1000.0000 mg | Freq: Once | INTRAVENOUS | Status: AC
  Administered 2020-03-16: 1000 mg via INTRAVENOUS
  Filled 2020-03-16: qty 100

## 2020-03-16 MED ORDER — CHLORHEXIDINE GLUCONATE CLOTH 2 % EX PADS
6.0000 | MEDICATED_PAD | Freq: Every day | CUTANEOUS | Status: DC
  Administered 2020-03-16 – 2020-03-21 (×6): 6 via TOPICAL

## 2020-03-16 MED ORDER — ENOXAPARIN SODIUM 30 MG/0.3ML ~~LOC~~ SOLN
30.0000 mg | SUBCUTANEOUS | Status: DC
  Administered 2020-03-16 – 2020-03-21 (×6): 30 mg via SUBCUTANEOUS
  Filled 2020-03-16 (×6): qty 0.3

## 2020-03-16 MED ORDER — POTASSIUM CHLORIDE 10 MEQ/100ML IV SOLN
10.0000 meq | INTRAVENOUS | Status: AC
  Administered 2020-03-16 (×3): 10 meq via INTRAVENOUS
  Filled 2020-03-16 (×3): qty 100

## 2020-03-16 MED ORDER — FUROSEMIDE 10 MG/ML IJ SOLN
INTRAMUSCULAR | Status: AC
  Filled 2020-03-16: qty 4

## 2020-03-16 MED ORDER — IPRATROPIUM-ALBUTEROL 0.5-2.5 (3) MG/3ML IN SOLN: 3.0000 mL | RESPIRATORY_TRACT | Status: DC | PRN

## 2020-03-16 MED ORDER — FUROSEMIDE 10 MG/ML IJ SOLN
40.0000 mg | Freq: Once | INTRAMUSCULAR | Status: AC
  Administered 2020-03-16: 40 mg via INTRAVENOUS

## 2020-03-16 NOTE — ED Notes (Signed)
RT at bedside to place patient on bipap 

## 2020-03-16 NOTE — ED Notes (Signed)
Patient's work of breathing increased. Patient with expiratory wheezes all fields. Admitting provider notified.

## 2020-03-16 NOTE — ED Notes (Signed)
Admitting provider notified of temperature. RN to continue blood administration per provider and administer tylenol.

## 2020-03-16 NOTE — Progress Notes (Signed)
PROGRESS NOTE    Tamara Mckinney  TZG:[id number] DOB: 12-07-1927 DOA: 03/15/2020 PCP: [name], MD   Chief complaint.  Fever.   Brief Narrative:  Tamara Mckinney is a 86 y.o. female with medical history significant of ovarian cancer on chemotherapy, GERD, hypertension, asthma, lupus anticoagulant osteoarthritis, anemia of chronic disease, who was seen yesterday with her cycle 6 chemotherapy and has been consistently doing fine until this morning when she started having fever.  Patient is a poor historian, could not provide additional information.  7/31.  Due to suspicion of liver abscess, obtain liver ultrasound.  Continue coverage with cefepime and Flagyl.  Also on vancomycin.  He developed significant short of breath and hypoxemia after IV fluids, received IV Lasix.  Currently off oxygen, no significant short of breath.   Assessment & Plan:   Principal Problem:   Sepsis ([place]) Active Problems:   Anemia   Malignant neoplasm of ovary (HCC)   Asthma   Esophageal reflux   Essential hypertension   Fibromyalgia   Sjogren's disease ([place])   Hyponatremia   Leucocytosis  #1.  Sepsis. Etiology still unclear.  Patient is immunocompromised from chemotherapy.  Currently covered with broad-spectrum antibiotics.  CT abdomen/pelvis could not rule out liver abscess, cefepime and Flagyl are proper treatment.  Procalcitonin level 28.4.  Blood cultures sent out, pending results.  I will obtain liver ultrasound to further study the liver.  If there is additional suspicion after ultrasound, may consider ultrasound-guided I&D.  #2.  Acute hypoxemic respiratory failure secondary to volume overload, due to acute on chronic congestive heart failure. Hypoxemia occurred after 3 L IV fluids, clear to due to volume overload.  Condition so far had improved after giving Lasix.  I do not have any concern for PE.  I will discontinue CT angiogram of the chest.  Patient has elevated D-dimer due to cancer.  I  will obtain echocardiogram to evaluate patient LV systolic function.  #3.  Symptomatic anemia. Secondary to chemotherapy.  Received 1 unit of PRBC.  I will check iron B12 level.  #4.  Hypokalemia. Supplement via IV.  5.  Hyponatremia. Likely secondary to SIADH.  Patient does not seem to have any dehydration at this time.  Continue to follow.  6.  Essential hypertension. Continue current treatment.  7.  Ovarian cancer. Outpatient follow-up with oncology.  8.  Severe protein calorie malnutrition. Patient BMI is 17.95, muscle atrophy.  This is secondary to cancer.  Supplement.    DVT prophylaxis: Lovenox Code Status: Full Family Communication: Son at bedside. Disposition Plan:   Patient came from:Home             Anticipated d/c place:  Barriers to d/c OR conditions which need to be met to effect a safe d/c:   Consultants:   None  Procedures: None Antimicrobials: Vancomycin Cefepime Flagyl  Subjective: Patient doing well today.  She is off oxygen, does not have any shortness of breath.  She has a poor memory, but no significant confusion. No fever or chills. Has some mild constipation, no abdominal pain, no nausea vomiting. No dysuria hematuria. No back pain.  Objective: Vitals:   03/16/20 0500 03/16/20 0600 03/16/20 0700 03/16/20 0800  BP: (!) 84/51 (!) 89/59 (!) 89/56 104/67  Pulse: 85 79 78 73  Resp: '20 18 18 17  ' Temp: 100.3 F (37.9 C)  97.9 F (36.6 C)   TempSrc: Axillary     SpO2: 100% 100% 100% 99%  Weight:  Height:        Intake/Output Summary (Last 24 hours) at 03/16/2020 0915 Last data filed at 03/16/2020 0655 Gross per 24 hour  Intake 1096.99 ml  Output 810 ml  Net 286.99 ml   Filed Weights   03/15/20 1733  Weight: (!) 43.1 kg    Examination:  General exam: Appears calm and comfortable  Respiratory system: Clear to auscultation. Respiratory effort normal. Cardiovascular system: S1 & S2 heard, RRR. No JVD, murmurs, rubs,  gallops or clicks. No pedal edema. Gastrointestinal system: Abdomen is nondistended, soft and Mild RUQ tender. No organomegaly or masses felt. Normal bowel sounds heard. Central nervous system: Alert and oriented x1. No focal neurological deficits. Extremities: Symmetric Skin: No rashes, lesions or ulcers Psychiatry:  Mood & affect appropriate.     Data Reviewed: I have personally reviewed following labs and imaging studies  CBC: Recent Labs  Lab 03/14/20 1005 03/15/20 1748 03/16/20 0502  WBC 18.5* 18.8* 15.6*  NEUTROABS 16.4* 17.9*  --   HGB 8.9* 7.4* 8.5*  HCT 26.7* 21.5* 26.0*  MCV 93.7 90.7 88.7  PLT 518* 306 932   Basic Metabolic Panel: Recent Labs  Lab 03/14/20 1005 03/15/20 1748 03/16/20 0502  NA 129* 129* 128*  K 4.5 3.8 2.9*  CL 96* 101 99  CO2 24 19* 20*  GLUCOSE 139* 120* 112*  BUN 26* 37* 28*  CREATININE 0.97 1.02* 0.87  CALCIUM 8.5* 7.7* 7.8*   GFR: Estimated Creatinine Clearance: 29.2 mL/min (by C-G formula based on SCr of 0.87 mg/dL). Liver Function Tests: Recent Labs  Lab 03/14/20 1005 03/15/20 1748 03/16/20 0502  AST 28 149* 152*  ALT 14 74* 88*  ALKPHOS 77 86 66  BILITOT 0.7 0.7 1.2  PROT 6.6 5.6* 5.2*  ALBUMIN 3.6 2.8* 2.6*   No results for input(s): LIPASE, AMYLASE in the last 168 hours. No results for input(s): AMMONIA in the last 168 hours. Coagulation Profile: Recent Labs  Lab 03/15/20 1748 03/16/20 0502  INR 1.3* 1.4*   Cardiac Enzymes: No results for input(s): CKTOTAL, CKMB, CKMBINDEX, TROPONINI in the last 168 hours. BNP (last 3 results) No results for input(s): PROBNP in the last 8760 hours. HbA1C: No results for input(s): HGBA1C in the last 72 hours. CBG: Recent Labs  Lab 03/16/20 0324  GLUCAP 107*   Lipid Profile: No results for input(s): CHOL, HDL, LDLCALC, TRIG, CHOLHDL, LDLDIRECT in the last 72 hours. Thyroid Function Tests: No results for input(s): TSH, T4TOTAL, FREET4, T3FREE, THYROIDAB in the last 72  hours. Anemia Panel: No results for input(s): VITAMINB12, FOLATE, FERRITIN, TIBC, IRON, RETICCTPCT in the last 72 hours. Sepsis Labs: Recent Labs  Lab 03/15/20 1748 03/15/20 2042 03/16/20 0502  PROCALCITON  --   --  28.40  LATICACIDVEN 1.6 1.2  --     Recent Results (from the past 240 hour(s))  Culture, blood (Routine x 2)     Status: None (Preliminary result)   Collection Time: 03/15/20  5:48 PM   Specimen: BLOOD  Result Value Ref Range Status   Specimen Description BLOOD LARM  Final   Special Requests   Final    BOTTLES DRAWN AEROBIC AND ANAEROBIC Blood Culture adequate volume   Culture   Final    NO GROWTH < 24 HOURS Performed at [facility], [address] [place], [place] [address]    Report Status PENDING  Incomplete  Culture, blood (Routine x 2)     Status: None (Preliminary result)   Collection Time: 03/15/20  5:49 PM   Specimen: BLOOD  Result Value Ref Range Status   Specimen Description BLOOD RAC  Final   Special Requests   Final    BOTTLES DRAWN AEROBIC AND ANAEROBIC Blood Culture adequate volume   Culture   Final    NO GROWTH < 24 HOURS Performed at [facility], [address], [place], [place] [address]    Report Status PENDING  Incomplete  SARS Coronavirus 2 by RT PCR (hospital order, performed in [facility] hospital lab) Nasopharyngeal Nasopharyngeal Swab     Status: None   Collection Time: 03/15/20  8:42 PM   Specimen: Nasopharyngeal Swab  Result Value Ref Range Status   SARS Coronavirus 2 NEGATIVE NEGATIVE Final    Comment: (NOTE) SARS-CoV-2 target nucleic acids are NOT DETECTED.  The SARS-CoV-2 RNA is generally detectable in upper and lower respiratory specimens during the acute phase of infection. The lowest concentration of SARS-CoV-2 viral copies this assay can detect is 250 copies / mL. A negative result does not preclude SARS-CoV-2 infection and should not be used as the sole basis for treatment or other patient  management decisions.  A negative result may occur with improper specimen collection / handling, submission of specimen other than nasopharyngeal swab, presence of viral mutation(s) within the areas targeted by this assay, and inadequate number of viral copies (<250 copies / mL). A negative result must be combined with clinical observations, patient history, and epidemiological information.  Fact Sheet for Patients:   StrictlyIdeas.no  Fact Sheet for Healthcare Providers: BankingDealers.co.za  This test is not yet approved or  cleared by the [place] FDA and has been authorized for detection and/or diagnosis of SARS-CoV-2 by FDA under an Emergency Use Authorization (EUA).  This EUA will remain in effect (meaning this test can be used) for the duration of the COVID-19 declaration under Section 564(b)(1) of the Act, 21 U.S.C. section 360bbb-3(b)(1), unless the authorization is terminated or revoked sooner.  Performed at [facility], [address] [place], [place] [address]   MRSA PCR Screening     Status: None   Collection Time: 03/16/20  5:00 AM   Specimen: Nasopharyngeal  Result Value Ref Range Status   MRSA by PCR NEGATIVE NEGATIVE Final    Comment:        The GeneXpert MRSA Assay (FDA approved for NASAL specimens only), is one component of a comprehensive MRSA colonization surveillance program. It is not intended to diagnose MRSA infection nor to guide or monitor treatment for MRSA infections. Performed at [facility], [address] [place], [place] [address]          Radiology Studies: CT ABDOMEN PELVIS W CONTRAST  Result Date: 03/15/2020 CLINICAL DATA:  Abdominal abscess or infection suspected. EXAM: CT ABDOMEN AND PELVIS WITH CONTRAST TECHNIQUE: Multidetector CT imaging of the abdomen and pelvis was performed using the standard protocol following bolus administration of intravenous  contrast. CONTRAST:  48m OMNIPAQUE IOHEXOL 300 MG/ML  SOLN COMPARISON:  November 14, 2019 FINDINGS: Lower chest: Hiatal hernia similar to prior study. No consolidation. No pleural effusion. Hepatobiliary: New hepatic masses. (Image 19, series 2) 5.2 x 4.7 cm. This is in the RIGHT hemi liver. And RIGHT hepatic vein and portal veins can be seen passing through this area. Another lesion measuring 2.4 cm in the anterior RIGHT hemi liver (image 15, series 2) 2 additional foci in the central RIGHT liver and 2 more near the dome of the RIGHT hemi liver of similar  size. These are low-density and septate. Surrounding edema in the liver is not appreciated. No significant biliary duct dilation or pericholecystic stranding. Pancreas: Pancreas is largely encased in the distal splenorenal ligament by tumor implant that is now more confluent than on the previous study. This measures approximately 7.7 x 5.0 cm as compared to 9.3 x 5.5 cm. This shows some small locules of gas within the lateral portion of the tumor on image 16 of series 2, this is of uncertain significance adjacent to the splenic flexure. Spleen: Splenic hilum infiltrated by tumor with similar appearance as described. Adrenals/Urinary Tract: RIGHT adrenal lesion unchanged. Symmetric renal enhancement. No hydronephrosis. Urinary is distended and extends below the pelvic floor. Stomach/Bowel: Postoperative changes about the rectum. Individual bowel loops are difficult to assess due to extensive tumor in the abdomen mildly dilated and filled with stool like material small-bowel loops are pushed into the RIGHT hemiabdomen. The appendix is normal. Vascular/Lymphatic: Tortuous abdominal aorta without aneurysmal dilation with calcified atheromatous plaques similar to the previous study. No retroperitoneal lymphadenopathy. No pelvic lymphadenopathy. Reproductive: Post hysterectomy. Signs of pelvic floor dysfunction as described. Other: No free air. LEFT retroperitoneal tumor  implant is similar to the prior study. Musculoskeletal: No acute bone finding. Spinal degenerative changes and scoliotic curvature of the spine with similar appearance to previous imaging. IMPRESSION: 1. New hepatic masses that are suspicious for metastatic disease with some atypical features given that vessels can be seen passing through these areas and that they are seen in the setting of tumor with peritoneal predilection, less commonly associated with hepatic parenchymal disease though this could be seen in the setting of advanced ovarian cancer. In the setting of fever, hepatic abscesses should be considered. Correlate with any recent antibiotic administration as an outpatient that may explain the lack of edema. Ultimately sampling of these areas may be helpful to differentiate. 2. Decrease in size of some areas but with gas in the area of tumor in the splenorenal ligament, this raises the question of secondary infection or gas related to necrosis. 3. Enlarging dominant lesion in the anterior abdomen with displacement of numerous bowel loops. Aortic Atherosclerosis (ICD10-I70.0). Electronically Signed   By: Zetta Bills M.D.   On: 03/15/2020 20:07   DG Chest Portable 1 View  Result Date: 03/16/2020 CLINICAL DATA:  Shortness of breath EXAM: PORTABLE CHEST 1 VIEW COMPARISON:  March 15, 2020 FINDINGS: There is mild cardiomegaly. Aortic knob calcifications are seen. Prominence of the central pulmonary vasculature are again noted. No large airspace consolidation or pleural effusion. A right-sided MediPort catheter seen with the tip in the mid SVC. No acute osseous abnormality. A small hiatal hernia is present. IMPRESSION: Mild pulmonary vascular congestion. Electronically Signed   By: Prudencio Pair M.D.   On: 03/16/2020 01:23   DG Chest Port 1 View  Result Date: 03/15/2020 CLINICAL DATA:  Fever.  Chemotherapy yesterday. EXAM: PORTABLE CHEST 1 VIEW COMPARISON:  No prior chest imaging. FINDINGS: Right chest  port remains in place. Upper normal heart size with retrocardiac hiatal hernia. No focal airspace disease. No pleural effusion or pneumothorax. No evidence of pulmonary edema. Scoliotic curvature of the spine without acute osseous abnormalities. IMPRESSION: 1. No acute chest findings. 2. Hiatal hernia. Electronically Signed   By: Keith Rake M.D.   On: 03/15/2020 19:52        Scheduled Meds:  enoxaparin (LOVENOX) injection  40 mg Subcutaneous Q24H   irbesartan  150 mg Oral Daily   pantoprazole  40 mg Oral  Daily   Continuous Infusions:  sodium chloride     ceFEPime (MAXIPIME) IV     metronidazole 500 mg (03/16/20 0736)   potassium chloride     vancomycin       LOS: 1 day    Time spent: 34 minutes    Sharen Hones, MD [facility]   To contact the attending provider between 7A-7P or the covering provider during after hours 7P-7A, please log into the web site [email or web address] and access using universal [place] password for that web site. If you do not have the password, please call the hospital operator.  03/16/2020, 9:15 AM

## 2020-03-16 NOTE — Progress Notes (Signed)
Sent message to pharmacy regarding missing doses of potassium. Due at 8:30

## 2020-03-16 NOTE — Progress Notes (Signed)
Patient transferred on bipap to icu without incident. Patient was placed on bipap earlier.  Upon presentation, she was found to very sob. Could barely speak due to grunting while struggling to breath.  Agreed with NP to try bipap for relief.  Patient respond very well to therapy.  Setting 7/4 60%.  O2 saturation obtainable on bipap whereas before not able to access no reading. Patient speaking well and looking much better.

## 2020-03-16 NOTE — Telephone Encounter (Signed)
On 7/31-I called back x2 to speak to- daughter, Bethena Roys. Unable to reach.

## 2020-03-16 NOTE — Progress Notes (Signed)
Family at the bedside- Notifies RN that patient is saying she is hot and that she feels very warm. Family said she had abouty 3-4 blankets on and therefore pulled them back. Assessed temp -100.2. Patient states she feels fine but feels like the room is hot. Encouraged patient to drink fluids. Will recheck temp in about an hour.

## 2020-03-16 NOTE — ED Notes (Signed)
Admitting provider at bedside Patient placed on nonrebreather

## 2020-03-16 NOTE — Progress Notes (Signed)
Cross Cover Brief Note  Patient with development of respiratory distress and hypoxia after receiving 3.5 liter of fluid as part of initial sepsis treatment,  Tachycardia also present.   She was placed on bipap to assist her breathing.  She was given lasix and maintenance iv fluids held. She has improved and now maintaining sats on 3 L [place].  She did receive one unit of blood.   Original plan for CTA chest to rule out PE. Due to previous receiving contrast she could not have stat scan.  Had changed timing to 12 hours after last scan per Radiologist recommendation.  Given improvements in condition, ddimer is now pending. If not elevated with cancel planned scan

## 2020-03-16 NOTE — Progress Notes (Signed)
Patient alert, with no complaints of pain or shortness of breath. Tolerated diet this am. [place] pending, reported to nurse she needs to remain NPO for 4-6 hours for ultrasound. Potassium replaced this am. Adequate urine output. Patient being tx to floor.

## 2020-03-16 NOTE — ED Notes (Signed)
Bolus stopped prior to completion per admitting provider

## 2020-03-16 NOTE — Progress Notes (Signed)
Per NP Randol Kern, patient does not need nursing to go down with her for CTA scan.

## 2020-03-17 ENCOUNTER — Inpatient Hospital Stay (HOSPITAL_COMMUNITY)
Admit: 2020-03-17 | Discharge: 2020-03-17 | Disposition: A | Discharge status: Home / Self Care | Payer: Medicare PPO | Attending: Internal Medicine | Admitting: Internal Medicine

## 2020-03-17 DIAGNOSIS — A409 Streptococcal sepsis, unspecified: Secondary | ICD-10-CM

## 2020-03-17 DIAGNOSIS — I5031 Acute diastolic (congestive) heart failure: Secondary | ICD-10-CM

## 2020-03-17 LAB — MAGNESIUM: Magnesium: 1.7 mg/dL (ref 1.7–2.4)

## 2020-03-17 LAB — CBC WITH DIFFERENTIAL/PLATELET
Abs Immature Granulocytes: 1.02 10*3/uL — ABNORMAL HIGH (ref 0.00–0.07)
Basophils Absolute: 0 10*3/uL (ref 0.0–0.1)
Basophils Relative: 0 %
Eosinophils Absolute: 0 10*3/uL (ref 0.0–0.5)
Eosinophils Relative: 0 %
HCT: 24.9 % — ABNORMAL LOW (ref 36.0–46.0)
Hemoglobin: 8.5 g/dL — ABNORMAL LOW (ref 12.0–15.0)
Immature Granulocytes: 10 %
Lymphocytes Relative: 2 %
Lymphs Abs: 0.2 10*3/uL — ABNORMAL LOW (ref 0.7–4.0)
MCH: 28.9 pg (ref 26.0–34.0)
MCHC: 34.1 g/dL (ref 30.0–36.0)
MCV: 84.7 fL (ref 80.0–100.0)
Monocytes Absolute: 0.2 10*3/uL (ref 0.1–1.0)
Monocytes Relative: 2 %
Neutro Abs: 8.8 10*3/uL — ABNORMAL HIGH (ref 1.7–7.7)
Neutrophils Relative %: 86 %
Platelets: 220 10*3/uL (ref 150–400)
RBC: 2.94 MIL/uL — ABNORMAL LOW (ref 3.87–5.11)
RDW: 21 % — ABNORMAL HIGH (ref 11.5–15.5)
Smear Review: NORMAL
WBC: 10.3 10*3/uL (ref 4.0–10.5)
nRBC: 0 % (ref 0.0–0.2)

## 2020-03-17 LAB — BLOOD CULTURE ID PANEL (REFLEXED)

## 2020-03-17 LAB — TYPE AND SCREEN
ABO/RH(D): A POS
Antibody Screen: NEGATIVE
Unit division: 0

## 2020-03-17 LAB — ECHOCARDIOGRAM COMPLETE
AR max vel: 1.19 cm2
AV Area VTI: 1.3 cm2
AV Area mean vel: 1.28 cm2
AV Mean grad: 7.7 mmHg
AV Peak grad: 14 mmHg
Ao pk vel: 1.87 m/s
Area-P 1/2: 4.1 cm2
Height: 61 in
S' Lateral: 2.04 cm
Weight: 1520 oz

## 2020-03-17 LAB — BASIC METABOLIC PANEL
Anion gap: 10 (ref 5–15)
BUN: 29 mg/dL — ABNORMAL HIGH (ref 8–23)
CO2: 20 mmol/L — ABNORMAL LOW (ref 22–32)
Calcium: 7.8 mg/dL — ABNORMAL LOW (ref 8.9–10.3)
Chloride: 99 mmol/L (ref 98–111)
Creatinine, Ser: 1.02 mg/dL — ABNORMAL HIGH (ref 0.44–1.00)
GFR calc Af Amer: 56 mL/min — ABNORMAL LOW (ref >60)
GFR calc non Af Amer: 48 mL/min — ABNORMAL LOW (ref >60)
Glucose, Bld: 88 mg/dL (ref 70–99)
Potassium: 3.5 mmol/L (ref 3.5–5.1)
Sodium: 129 mmol/L — ABNORMAL LOW (ref 135–145)

## 2020-03-17 LAB — URINE CULTURE: Culture: NO GROWTH

## 2020-03-17 LAB — BPAM RBC
Blood Product Expiration Date: 202108202359
ISSUE DATE / TIME: 202107310112
Unit Type and Rh: 6200

## 2020-03-17 MED ORDER — POTASSIUM CHLORIDE IN NACL 20-0.9 MEQ/L-% IV SOLN: INTRAVENOUS | Status: DC

## 2020-03-17 MED ORDER — SODIUM CHLORIDE 0.9 % IV SOLN
2.0000 g | INTRAVENOUS | Status: DC
  Administered 2020-03-17 – 2020-03-22 (×6): 2 g via INTRAVENOUS
  Filled 2020-03-17 (×2): qty 20
  Filled 2020-03-17: qty 2
  Filled 2020-03-17 (×2): qty 20
  Filled 2020-03-17: qty 2
  Filled 2020-03-17: qty 20

## 2020-03-17 MED ORDER — SODIUM CHLORIDE 0.9 % IV SOLN
200.0000 mg | Freq: Once | INTRAVENOUS | Status: AC
  Administered 2020-03-17: 200 mg via INTRAVENOUS
  Filled 2020-03-17: qty 10

## 2020-03-17 MED ORDER — SODIUM CHLORIDE 0.9% FLUSH
10.0000 mL | Freq: Two times a day (BID) | INTRAVENOUS | Status: DC
  Administered 2020-03-17 – 2020-03-21 (×11): 10 mL via INTRAVENOUS

## 2020-03-17 MED ORDER — SODIUM CHLORIDE 0.9 % IV SOLN
INTRAVENOUS | Status: DC | PRN
  Administered 2020-03-17: 250 mL via INTRAVENOUS

## 2020-03-17 NOTE — Progress Notes (Signed)
PHARMACY - PHYSICIAN COMMUNICATION CRITICAL VALUE ALERT - BLOOD CULTURE IDENTIFICATION (BCID)  Tamara Mckinney is an 86 y.o. female who presented to [facility] on 03/15/2020 with a chief complaint of fever w/ h/o ovarian cancer stage IIIc on chemo (s/p 6th cycle of chemo), HTN, asthma, lupus.  Assessment:  Tmax 102.72F, tachycardic (HR 110's), tachypneic 20 - 24's, SBP 89 - 100's, on admission O2 95 - 100 on BiPAP, PCT 28.40, WBC 18.8 >> 18.5 >> 15.6,  CT abdomen: IMPRESSION: 1. New hepatic masses that are suspicious for metastatic disease with some atypical features given that vessels can be seen passing through these areas and that they are seen in the setting of tumor with peritoneal predilection, less commonly associated with hepatic parenchymal disease though this could be seen in the setting of advanced ovarian cancer. In the setting of fever, hepatic abscesses should be considered. Correlate with any recent antibiotic administration as an outpatient that may explain the lack of edema. Ultimately sampling of these areas may be helpful to differentiate. 2. Decrease in size of some areas but with gas in the area of tumor in the splenorenal ligament, this raises the question of secondary infection or gas related to necrosis. 3. Enlarging dominant lesion in the anterior abdomen with displacement of numerous bowel loops.  Possible sources include: implanted port placed for 1004 days, wound incision for 1040 days; hepatic intra-abdominal source w/ possible abscesses.  1/4 GPC BCID streptococcus no specific strain identified  Name of physician (or Provider) Contacted: Sharion Settler  Current antibiotics: Vanc/cefepime/flagyl  Changes to prescribed antibiotics recommended:  Recommendations accepted by provider -- will discontinue vancomycin and switch cefepime to ceftriaxone 2g IV q24h for strep coverage and continue flagyl 500 mg IV q8h for coverage of anaerobes d/t intra-abdominal  source w/ possible hepatic abscesses.  Results for orders placed or performed during the hospital encounter of 03/15/20  Blood Culture ID Panel (Reflexed) (Collected: 03/15/2020  5:49 PM)  Result Value Ref Range   Enterococcus species NOT DETECTED NOT DETECTED   Listeria monocytogenes NOT DETECTED NOT DETECTED   Staphylococcus species NOT DETECTED NOT DETECTED   Staphylococcus aureus (BCID) NOT DETECTED NOT DETECTED   Streptococcus species DETECTED (A) NOT DETECTED   Streptococcus agalactiae NOT DETECTED NOT DETECTED   Streptococcus pneumoniae NOT DETECTED NOT DETECTED   Streptococcus pyogenes NOT DETECTED NOT DETECTED   Acinetobacter baumannii NOT DETECTED NOT DETECTED   Enterobacteriaceae species NOT DETECTED NOT DETECTED   Enterobacter cloacae complex NOT DETECTED NOT DETECTED   Escherichia coli NOT DETECTED NOT DETECTED   Klebsiella oxytoca NOT DETECTED NOT DETECTED   Klebsiella pneumoniae NOT DETECTED NOT DETECTED   Proteus species NOT DETECTED NOT DETECTED   Serratia marcescens NOT DETECTED NOT DETECTED   Haemophilus influenzae NOT DETECTED NOT DETECTED   Neisseria meningitidis NOT DETECTED NOT DETECTED   Pseudomonas aeruginosa NOT DETECTED NOT DETECTED   Candida albicans NOT DETECTED NOT DETECTED   Candida glabrata NOT DETECTED NOT DETECTED   Candida krusei NOT DETECTED NOT DETECTED   Candida parapsilosis NOT DETECTED NOT DETECTED   Candida tropicalis NOT DETECTED NOT DETECTED   Tobie Lords, PharmD, BCPS Clinical Pharmacist 03/17/2020  4:42 AM

## 2020-03-17 NOTE — Progress Notes (Signed)
PROGRESS NOTE    Tamara Mckinney  NID:[id number] DOB: 1928/08/08 DOA: 03/15/2020 PCP: [name], MD   Of complaint.  Fever and chills. Brief Narrative:  Tamara Mckinney a 86 y.o.femalewith medical history significant ofovarian cancer on chemotherapy, GERD, hypertension, asthma, lupus anticoagulant osteoarthritis, anemia of chronic disease, who was seen yesterday with her cycle 6 chemotherapy and has been consistently doing fine until this morning when she started having fever.  Patient is a poor historian, could not provide additional information.  7/31.  Due to suspicion of liver abscess, obtain liver ultrasound.  Continue coverage with cefepime and Flagyl.  Also on vancomycin.  He developed significant short of breath and hypoxemia after IV fluids, received IV Lasix.  Currently off oxygen, no significant short of breath.  8/1.  Right upper quadrant ultrasound did not show any evidence of abscess.  Blood culture came back with Streptococcus, pending final results.  Antibiotic switched to Rocephin.    Assessment & Plan:   Principal Problem:   Sepsis ([place]) Active Problems:   Anemia   Malignant neoplasm of ovary (HCC)   Asthma   Esophageal reflux   Essential hypertension   Fibromyalgia   Sjogren's disease ([place])   Hyponatremia   Leucocytosis   Severe protein-calorie malnutrition Tamara Mckinney: less than 60% of standard weight) ([place])   Acute hypoxemic respiratory failure ([place])  #1.  Streptococcus septicemia. Secondary to immunocompromise status due to chemotherapy.  Patient symptom started suddenly.  Current blood culture showed Streptococcus, final identification still pending.  TTE will be performed, repeat blood culture is also obtained.  If there is any abnormality on TTE, or repeat cultures are positive, patient will obtain TEE to rule out endocarditis.  Continue Rocephin 2 g daily.  Patient most likely be treated for 2 weeks with IV Rocephin if no evidence of  endocarditis.  2.  Acute hypoxemic respiratory failure secondary to volume overload. Pending echocardiogram, no additional short of breath or hypoxemia.  Condition has resolved.  3.  Iron deficient anemia.  B12 level normal. We will give IV iron.  4.  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 Continue to follow.  5.  Ovarian cancer. Outpatient follow-up with oncology.  6.  Severe protein calorie malnutrition. Continue supplement.  DVT prophylaxis: Lovenox Code Status: Full Family Communication: Son at bedside. Disposition Plan:   Patient came from:Home                                                                                                                            Anticipated d/c place:  Barriers to d/c OR conditions which need to be met to effect a safe d/c:   Consultants:   None  Procedures: None Antimicrobials: Rocephin.  Subjective: Patient doing well today.  No additional short of breath or cough. No nausea vomiting or abdominal pain.  No diarrhea constipation. No fever or chills.  Objective: Vitals:   03/17/20 0032 03/17/20 0448 03/17/20 0808 03/17/20 1143  BP: 120/73 127/72  124/76 127/81  Pulse: 95 97 84 86  Resp: '19 23 18 18  ' Temp: 97.6 F (36.4 C) 98.1 F (36.7 C) 98.2 F (36.8 C) 97.6 F (36.4 C)  TempSrc: Oral Oral Oral Oral  SpO2: (!) 82% 97% 98% 99%  Weight:      Height:        Intake/Output Summary (Last 24 hours) at 03/17/2020 1341 Last data filed at 03/17/2020 1027 Gross per 24 hour  Intake 911.6 ml  Output 500 ml  Net 411.6 ml   Filed Weights   03/15/20 1733  Weight: (!) 43.1 kg    Examination:  General exam: Appears calm and comfortable  Respiratory system: Clear to auscultation. Respiratory effort normal. Cardiovascular system: S1 & S2 heard, RRR. No JVD, murmurs, rubs, gallops or clicks. No pedal edema. Gastrointestinal system: Abdomen is nondistended, soft and nontender. No organomegaly or masses felt. Normal bowel  sounds heard. Central nervous system: Alert and oriented x2. No focal neurological deficits. Extremities: Symmetric Skin: No rashes, lesions or ulcers Psychiatry:  Mood & affect appropriate.     Data Reviewed: I have personally reviewed following labs and imaging studies  CBC: Recent Labs  Lab 03/14/20 1005 03/15/20 1748 03/16/20 0502 03/17/20 0602  WBC 18.5* 18.8* 15.6* 10.3  NEUTROABS 16.4* 17.9*  --  8.8*  HGB 8.9* 7.4* 8.5* 8.5*  HCT 26.7* 21.5* 26.0* 24.9*  MCV 93.7 90.7 88.7 84.7  PLT 518* 306 262 737   Basic Metabolic Panel: Recent Labs  Lab 03/14/20 1005 03/15/20 1748 03/16/20 0502 03/17/20 0602  NA 129* 129* 128* 129*  K 4.5 3.8 2.9* 3.5  CL 96* 101 99 99  CO2 24 19* 20* 20*  GLUCOSE 139* 120* 112* 88  BUN 26* 37* 28* 29*  CREATININE 0.97 1.02* 0.87 1.02*  CALCIUM 8.5* 7.7* 7.8* 7.8*  MG  --   --   --  1.7   GFR: Estimated Creatinine Clearance: 24.9 mL/min (A) (by C-G formula based on SCr of 1.02 mg/dL (H)). Liver Function Tests: Recent Labs  Lab 03/14/20 1005 03/15/20 1748 03/16/20 0502  AST 28 149* 152*  ALT 14 74* 88*  ALKPHOS 77 86 66  BILITOT 0.7 0.7 1.2  PROT 6.6 5.6* 5.2*  ALBUMIN 3.6 2.8* 2.6*   No results for input(s): LIPASE, AMYLASE in the last 168 hours. No results for input(s): AMMONIA in the last 168 hours. Coagulation Profile: Recent Labs  Lab 03/15/20 1748 03/16/20 0502  INR 1.3* 1.4*   Cardiac Enzymes: No results for input(s): CKTOTAL, CKMB, CKMBINDEX, TROPONINI in the last 168 hours. BNP (last 3 results) No results for input(s): PROBNP in the last 8760 hours. HbA1C: No results for input(s): HGBA1C in the last 72 hours. CBG: Recent Labs  Lab 03/16/20 0324  GLUCAP 107*   Lipid Profile: No results for input(s): CHOL, HDL, LDLCALC, TRIG, CHOLHDL, LDLDIRECT in the last 72 hours. Thyroid Function Tests: No results for input(s): TSH, T4TOTAL, FREET4, T3FREE, THYROIDAB in the last 72 hours. Anemia Panel: Recent Labs     03/16/20 0502 03/16/20 0506  VITAMINB12  --  421  TIBC 221*  --   IRON 10*  --    Sepsis Labs: Recent Labs  Lab 03/15/20 1748 03/15/20 2042 03/16/20 0502  PROCALCITON  --   --  28.40  LATICACIDVEN 1.6 1.2  --     Recent Results (from the past 240 hour(s))  Culture, blood (Routine x 2)     Status: None (Preliminary result)  Collection Time: 03/15/20  5:48 PM   Specimen: BLOOD  Result Value Ref Range Status   Specimen Description BLOOD LARM  Final   Special Requests   Final    BOTTLES DRAWN AEROBIC AND ANAEROBIC Blood Culture adequate volume   Culture  Setup Time ANAEROBIC BOTTLE ONLY NO ORGANISMS SEEN   Final   Culture   Final    NO GROWTH 2 DAYS Performed at [facility], [address], [place], [place] [address]    Report Status PENDING  Incomplete  Culture, blood (Routine x 2)     Status: None (Preliminary result)   Collection Time: 03/15/20  5:49 PM   Specimen: BLOOD  Result Value Ref Range Status   Specimen Description BLOOD RAC  Final   Special Requests   Final    BOTTLES DRAWN AEROBIC AND ANAEROBIC Blood Culture adequate volume   Culture  Setup Time   Final    Organism ID to follow ANAEROBIC BOTTLE ONLY GRAM POSITIVE COCCI CRITICAL RESULT CALLED TO, READ BACK BY AND VERIFIED WITH: DAVID BESANTI '@[email or web address]'  03/17/2020 TTG    Culture   Final    NO GROWTH 2 DAYS Performed at [facility], [address] [place], [place] [address]    Report Status PENDING  Incomplete  Blood Culture ID Panel (Reflexed)     Status: Abnormal   Collection Time: 03/15/20  5:49 PM  Result Value Ref Range Status   Enterococcus species NOT DETECTED NOT DETECTED Final   Listeria monocytogenes NOT DETECTED NOT DETECTED Final   Staphylococcus species NOT DETECTED NOT DETECTED Final   Staphylococcus aureus (BCID) NOT DETECTED NOT DETECTED Final   Streptococcus species DETECTED (A) NOT DETECTED Final    Comment: Not Enterococcus species, Streptococcus agalactiae,  Streptococcus pyogenes, or Streptococcus pneumoniae. CRITICAL RESULT CALLED TO, READ BACK BY AND VERIFIED WITH: DAVID BESANTI '@[id number]'  03/17/2020 TTG    Streptococcus agalactiae NOT DETECTED NOT DETECTED Final   Streptococcus pneumoniae NOT DETECTED NOT DETECTED Final   Streptococcus pyogenes NOT DETECTED NOT DETECTED Final   Acinetobacter baumannii NOT DETECTED NOT DETECTED Final   Enterobacteriaceae species NOT DETECTED NOT DETECTED Final   Enterobacter cloacae complex NOT DETECTED NOT DETECTED Final   Escherichia coli NOT DETECTED NOT DETECTED Final   Klebsiella oxytoca NOT DETECTED NOT DETECTED Final   Klebsiella pneumoniae NOT DETECTED NOT DETECTED Final   Proteus species NOT DETECTED NOT DETECTED Final   Serratia marcescens NOT DETECTED NOT DETECTED Final   Haemophilus influenzae NOT DETECTED NOT DETECTED Final   Neisseria meningitidis NOT DETECTED NOT DETECTED Final   Pseudomonas aeruginosa NOT DETECTED NOT DETECTED Final   Candida albicans NOT DETECTED NOT DETECTED Final   Candida glabrata NOT DETECTED NOT DETECTED Final   Candida krusei NOT DETECTED NOT DETECTED Final   Candida parapsilosis NOT DETECTED NOT DETECTED Final   Candida tropicalis NOT DETECTED NOT DETECTED Final    Comment: Performed at [facility], [address] [place], [place] [address]  SARS Coronavirus 2 by RT PCR (hospital order, performed in [place] hospital lab) Nasopharyngeal Nasopharyngeal Swab     Status: None   Collection Time: 03/15/20  8:42 PM   Specimen: Nasopharyngeal Swab  Result Value Ref Range Status   SARS Coronavirus 2 NEGATIVE NEGATIVE Final    Comment: (NOTE) SARS-CoV-2 target nucleic acids are NOT DETECTED.  The SARS-CoV-2 RNA is generally detectable in upper and lower respiratory specimens during the acute phase of infection. The lowest concentration of SARS-CoV-2 viral  copies this assay can detect is 250 copies / mL. A negative result does not preclude SARS-CoV-2  infection and should not be used as the sole basis for treatment or other patient management decisions.  A negative result may occur with improper specimen collection / handling, submission of specimen other than nasopharyngeal swab, presence of viral mutation(s) within the areas targeted by this assay, and inadequate number of viral copies (<250 copies / mL). A negative result must be combined with clinical observations, patient history, and epidemiological information.  Fact Sheet for Patients:   StrictlyIdeas.no  Fact Sheet for Healthcare Providers: BankingDealers.co.za  This test is not yet approved or  cleared by the [place] FDA and has been authorized for detection and/or diagnosis of SARS-CoV-2 by FDA under an Emergency Use Authorization (EUA).  This EUA will remain in effect (meaning this test can be used) for the duration of the COVID-19 declaration under Section 564(b)(1) of the Act, 21 U.S.C. section 360bbb-3(b)(1), unless the authorization is terminated or revoked sooner.  Performed at [facility], [address], [place], [place] [address]   Urine culture     Status: None   Collection Time: 03/15/20  8:42 PM   Specimen: Urine, Random  Result Value Ref Range Status   Specimen Description   Final    URINE, RANDOM Performed at [facility], [address], [place], [place] [address]    Special Requests   Final    NONE Performed at [facility], [address] [place], [place] [address]    Culture   Final    NO GROWTH Performed at [facility], [place] [address], [place], [place] [address]    Report Status 03/17/2020 FINAL  Final  MRSA PCR Screening     Status: None   Collection Time: 03/16/20  5:00 AM   Specimen: Nasopharyngeal  Result Value Ref Range Status   MRSA by PCR NEGATIVE NEGATIVE Final    Comment:        The GeneXpert MRSA Assay (FDA approved for NASAL  specimens only), is one component of a comprehensive MRSA colonization surveillance program. It is not intended to diagnose MRSA infection nor to guide or monitor treatment for MRSA infections. Performed at [facility], [address], [place],  [address]          Radiology Studies: CT ABDOMEN PELVIS W CONTRAST  Result Date: 03/15/2020 CLINICAL DATA:  Abdominal abscess or infection suspected. EXAM: CT ABDOMEN AND PELVIS WITH CONTRAST TECHNIQUE: Multidetector CT imaging of the abdomen and pelvis was performed using the standard protocol following bolus administration of intravenous contrast. CONTRAST:  74m OMNIPAQUE IOHEXOL 300 MG/ML  SOLN COMPARISON:  November 14, 2019 FINDINGS: Lower chest: Hiatal hernia similar to prior study. No consolidation. No pleural effusion. Hepatobiliary: New hepatic masses. (Image 19, series 2) 5.2 x 4.7 cm. This is in the RIGHT hemi liver. And RIGHT hepatic vein and portal veins can be seen passing through this area. Another lesion measuring 2.4 cm in the anterior RIGHT hemi liver (image 15, series 2) 2 additional foci in the central RIGHT liver and 2 more near the dome of the RIGHT hemi liver of similar size. These are low-density and septate. Surrounding edema in the liver is not appreciated. No significant biliary duct dilation or pericholecystic stranding. Pancreas: Pancreas is largely encased in the distal splenorenal ligament by tumor implant that is now more confluent than on the previous study. This measures approximately 7.7 x 5.0  cm as compared to 9.3 x 5.5 cm. This shows some small locules of gas within the lateral portion of the tumor on image 16 of series 2, this is of uncertain significance adjacent to the splenic flexure. Spleen: Splenic hilum infiltrated by tumor with similar appearance as described. Adrenals/Urinary Tract: RIGHT adrenal lesion unchanged. Symmetric renal enhancement. No hydronephrosis. Urinary is distended and extends  below the pelvic floor. Stomach/Bowel: Postoperative changes about the rectum. Individual bowel loops are difficult to assess due to extensive tumor in the abdomen mildly dilated and filled with stool like material small-bowel loops are pushed into the RIGHT hemiabdomen. The appendix is normal. Vascular/Lymphatic: Tortuous abdominal aorta without aneurysmal dilation with calcified atheromatous plaques similar to the previous study. No retroperitoneal lymphadenopathy. No pelvic lymphadenopathy. Reproductive: Post hysterectomy. Signs of pelvic floor dysfunction as described. Other: No free air. LEFT retroperitoneal tumor implant is similar to the prior study. Musculoskeletal: No acute bone finding. Spinal degenerative changes and scoliotic curvature of the spine with similar appearance to previous imaging. IMPRESSION: 1. New hepatic masses that are suspicious for metastatic disease with some atypical features given that vessels can be seen passing through these areas and that they are seen in the setting of tumor with peritoneal predilection, less commonly associated with hepatic parenchymal disease though this could be seen in the setting of advanced ovarian cancer. In the setting of fever, hepatic abscesses should be considered. Correlate with any recent antibiotic administration as an outpatient that may explain the lack of edema. Ultimately sampling of these areas may be helpful to differentiate. 2. Decrease in size of some areas but with gas in the area of tumor in the splenorenal ligament, this raises the question of secondary infection or gas related to necrosis. 3. Enlarging dominant lesion in the anterior abdomen with displacement of numerous bowel loops. Aortic Atherosclerosis (ICD10-I70.0). Electronically Signed   By: Zetta Bills M.D.   On: 03/15/2020 20:07   DG Chest Portable 1 View  Result Date: 03/16/2020 CLINICAL DATA:  Shortness of breath EXAM: PORTABLE CHEST 1 VIEW COMPARISON:  March 15, 2020  FINDINGS: There is mild cardiomegaly. Aortic knob calcifications are seen. Prominence of the central pulmonary vasculature are again noted. No large airspace consolidation or pleural effusion. A right-sided MediPort catheter seen with the tip in the mid SVC. No acute osseous abnormality. A small hiatal hernia is present. IMPRESSION: Mild pulmonary vascular congestion. Electronically Signed   By: Prudencio Pair M.D.   On: 03/16/2020 01:23   DG Chest Port 1 View  Result Date: 03/15/2020 CLINICAL DATA:  Fever.  Chemotherapy yesterday. EXAM: PORTABLE CHEST 1 VIEW COMPARISON:  No prior chest imaging. FINDINGS: Right chest port remains in place. Upper normal heart size with retrocardiac hiatal hernia. No focal airspace disease. No pleural effusion or pneumothorax. No evidence of pulmonary edema. Scoliotic curvature of the spine without acute osseous abnormalities. IMPRESSION: 1. No acute chest findings. 2. Hiatal hernia. Electronically Signed   By: Keith Rake M.D.   On: 03/15/2020 19:52   US Abdomen Limited RUQ  Result Date: 03/16/2020 CLINICAL DATA:  Multiple liver lesions. Possible Mets versus abscesses. EXAM: ULTRASOUND ABDOMEN LIMITED RIGHT UPPER QUADRANT COMPARISON:  CT abdomen/pelvis 03/15/2020 FINDINGS: Gallbladder: No gallstones or wall thickening visualized. No sonographic Murphy sign noted by sonographer. Common bile duct: Diameter: 3 mm Liver: Multiple hypoechoic masses are present scattered throughout the liver. The masses appear hypoechoic but solid in nature. No definite internal fluid component or debris to suggest the presence of abscesses.  The largest mass in the central aspect of the right hemi liver measures 7.3 x 5.2 x 6.9 cm. Two additional index lesions measured 2 x 1.4 x 2.2 cm and 1.9 x 1.5 x 1.6 cm respectively. Portal vein is patent on color Doppler imaging with normal direction of blood flow towards the liver. Other: Technically challenging examination secondary to labored breathing.  IMPRESSION: Technically challenging examination secondary to labored breathing. The multifocal hepatic lesions appear hypoechoic but solid in nature more consistent with hepatic metastatic disease than multifocal hepatic abscesses. Electronically Signed   By: Jacqulynn Cadet M.D.   On: 03/16/2020 14:51        Scheduled Meds: . Chlorhexidine Gluconate Cloth  6 each Topical Daily  . enoxaparin (LOVENOX) injection  30 mg Subcutaneous Q24H  . pantoprazole  40 mg Oral Daily   Continuous Infusions: . sodium chloride    . cefTRIAXone (ROCEPHIN)  IV Stopped (03/17/20 3234)  . metronidazole Stopped (03/17/20 0746)     LOS: 2 days    Time spent: 28 minutes    Sharen Hones, MD [facility]   To contact the attending provider between 7A-7P or the covering provider during after hours 7P-7A, please log into the web site [email or web address] and access using universal [place] password for that web site. If you do not have the password, please call the hospital operator.  03/17/2020, 1:41 PM

## 2020-03-18 ENCOUNTER — Inpatient Hospital Stay: Payer: Medicare PPO

## 2020-03-18 DIAGNOSIS — K769 Liver disease, unspecified: Secondary | ICD-10-CM

## 2020-03-18 DIAGNOSIS — D739 Disease of spleen, unspecified: Secondary | ICD-10-CM

## 2020-03-18 DIAGNOSIS — R6520 Severe sepsis without septic shock: Secondary | ICD-10-CM

## 2020-03-18 DIAGNOSIS — A409 Streptococcal sepsis, unspecified: Principal | ICD-10-CM

## 2020-03-18 DIAGNOSIS — A408 Other streptococcal sepsis: Secondary | ICD-10-CM

## 2020-03-18 DIAGNOSIS — N289 Disorder of kidney and ureter, unspecified: Secondary | ICD-10-CM

## 2020-03-18 DIAGNOSIS — C801 Malignant (primary) neoplasm, unspecified: Secondary | ICD-10-CM

## 2020-03-18 DIAGNOSIS — B954 Other streptococcus as the cause of diseases classified elsewhere: Secondary | ICD-10-CM

## 2020-03-18 DIAGNOSIS — D630 Anemia in neoplastic disease: Secondary | ICD-10-CM

## 2020-03-18 DIAGNOSIS — I10 Essential (primary) hypertension: Secondary | ICD-10-CM

## 2020-03-18 DIAGNOSIS — C786 Secondary malignant neoplasm of retroperitoneum and peritoneum: Secondary | ICD-10-CM

## 2020-03-18 LAB — BASIC METABOLIC PANEL
Anion gap: 6 (ref 5–15)
BUN: 25 mg/dL — ABNORMAL HIGH (ref 8–23)
CO2: 24 mmol/L (ref 22–32)
Calcium: 7.9 mg/dL — ABNORMAL LOW (ref 8.9–10.3)
Chloride: 101 mmol/L (ref 98–111)
Creatinine, Ser: 1.05 mg/dL — ABNORMAL HIGH (ref 0.44–1.00)
GFR calc Af Amer: 54 mL/min — ABNORMAL LOW (ref >60)
GFR calc non Af Amer: 47 mL/min — ABNORMAL LOW (ref >60)
Glucose, Bld: 98 mg/dL (ref 70–99)
Potassium: 3.3 mmol/L — ABNORMAL LOW (ref 3.5–5.1)
Sodium: 131 mmol/L — ABNORMAL LOW (ref 135–145)

## 2020-03-18 LAB — PHOSPHORUS: Phosphorus: 2.7 mg/dL (ref 2.5–4.6)

## 2020-03-18 LAB — MAGNESIUM: Magnesium: 1.7 mg/dL (ref 1.7–2.4)

## 2020-03-18 LAB — GLUCOSE, CAPILLARY: Glucose-Capillary: 70 mg/dL (ref 70–99)

## 2020-03-18 LAB — PROCALCITONIN: Procalcitonin: 13.72 ng/mL

## 2020-03-18 MED ORDER — LIDOCAINE 5 % EX PTCH
1.0000 | MEDICATED_PATCH | CUTANEOUS | Status: DC
  Administered 2020-03-18 – 2020-03-21 (×4): 1 via TRANSDERMAL
  Filled 2020-03-18 (×5): qty 1

## 2020-03-18 MED ORDER — POTASSIUM CHLORIDE 10 MEQ/100ML IV SOLN
10.0000 meq | INTRAVENOUS | Status: AC
  Administered 2020-03-18 (×2): 10 meq via INTRAVENOUS
  Filled 2020-03-18: qty 100

## 2020-03-18 MED ORDER — PREDNISONE 20 MG PO TABS
40.0000 mg | ORAL_TABLET | Freq: Every day | ORAL | Status: DC
  Administered 2020-03-18 – 2020-03-22 (×5): 40 mg via ORAL
  Filled 2020-03-18 (×4): qty 2

## 2020-03-18 NOTE — Care Management Important Message (Signed)
Important Message  Patient Details  Name: Tamara Mckinney MRN: [id number] Date of Birth: [birth year]   Medicare Important Message Given:  Yes     Juliann Pulse A Baley Shands 03/18/2020, 12:49 PM

## 2020-03-18 NOTE — Consult Note (Signed)
NAME: Tamara Mckinney  DOB: [birth year]  MRN: [id number]  Date/Time: 03/18/2020 12:31 PM  REQUESTING PROVIDER: Dr.Zhang Subjective:  REASON FOR CONSULT: Strep intermedius bacteremia?  Tamara Mckinney is a 86 y.o. female with a history of Stage IIIa ovarian carcinoma on chemo followed by Dr.Finnegan. Last chemo was on 03/14/20 - received gemictabine cycle 7 day 1. Presents to the ED on 7/30 with fever . As per patient she does not remember  What had  happened- she was fatigued and weak and thinks she passed  Out. Her son says she had fever and chills and was brought to the ED. In the ED temp 102.3, HR 115, BP 101/50 Labs revealed WBC of 18.8 ( it was 18.5 the day before when she got her chemo), HB 7.4, PLT 306, cr 1.02. Blood cultures were sent and patient was started on antibiotics. Patient also underwent a CT scan of the abdomen.  This showed new hepatic masses that are suspicious for metastatic disease with some atypical features given that vessels can be seen passing through these areas and at the are seen in the setting of tumor with peritoneal predilection.  The differential diagnosis was hepatic abscesses.  There was also an enlarging dominant lesion in the anterior abdomen with displacement of numerous  bowel loops.  And also tumor in the splenorenal ligament.  There was gas in the area of this tumor and it raises the question of secondary infection or gas related to necrosis in this area.  Patient initially was started on vancomycin, cefepime and Flagyl and once the blood cultures showed Streptococcus intermedius the vancomycin and cefepime were discontinued and she was started on ceftriaxone.  Flagyl was continued.  I am asked to see the patient for the same. Patient has a port placed since the past 3 years. Patient denies any nausea or vomiting diarrhea, cough.  She has fatigue and weakness.  She has been living on her own with her son living next door.  She has been battling ovarian cancer for  the past 3 years.  Past Medical History:  Diagnosis Date  . Anemia   . Arthritis   . Asthma   . Dyspnea   . GERD (gastroesophageal reflux disease)   . Hypertension   . Ovarian cancer ([place]) 2018    Past Surgical History:  Procedure Laterality Date  . ABDOMINAL HYSTERECTOMY    . BREAST BIOPSY     x6  . BUNIONECTOMY Bilateral   . CATARACT EXTRACTION, BILATERAL    . FLEXIBLE SIGMOIDOSCOPY N/A 05/21/2017   Procedure: FLEXIBLE SIGMOIDOSCOPY;  Surgeon: Lin Landsman, MD;  Location: [facility];  Service: Gastroenterology;  Laterality: N/A;  . INCONTINENCE SURGERY    . PORTA CATH INSERTION N/A 06/16/2017   Procedure: PORTA CATH INSERTION;  Surgeon: Algernon Huxley, MD;  Location: [place] [facility];  Service: Cardiovascular;  Laterality: N/A;  . RECTAL SURGERY  04/2018  . REPAIR OF RECTAL PROLAPSE N/A 05/11/2017   Procedure: REPAIR OF RECTAL PROLAPSE;  Surgeon: Leonie Green, MD;  Location: [facility] ORS;  Service: General;  Laterality: N/A;  . TONSILLECTOMY    . VAGINA SURGERY     uncertain procedure performed    Social History   Socioeconomic History  . Marital status: Widowed    Spouse name: Not on file  . Number of children: Not on file  . Years of education: Not on file  . Highest education level: Not on file  Occupational History  . Not on  file  Tobacco Use  . Smoking status: Never Smoker  . Smokeless tobacco: Never Used  Vaping Use  . Vaping Use: Never used  Substance and Sexual Activity  . Alcohol use: No  . Drug use: No  . Sexual activity: Never  Other Topics Concern  . Not on file  Social History Narrative  . Not on file   Social Determinants of Health   Financial Resource Strain:   . Difficulty of Paying Living Expenses:   Food Insecurity:   . Worried About Charity fundraiser in the Last Year:   . Arboriculturist in the Last Year:   Transportation Needs:   . Film/video editor (Medical):   Marland Kitchen Lack of Transportation (Non-Medical):     Physical Activity:   . Days of Exercise per Week:   . Minutes of Exercise per Session:   Stress:   . Feeling of Stress :   Social Connections:   . Frequency of Communication with Friends and Family:   . Frequency of Social Gatherings with Friends and Family:   . Attends Religious Services:   . Active Member of Clubs or Organizations:   . Attends Archivist Meetings:   Marland Kitchen Marital Status:   Intimate Partner Violence:   . Fear of Current or Ex-Partner:   . Emotionally Abused:   Marland Kitchen Physically Abused:   . Sexually Abused:     Family History  Problem Relation Age of Onset  . Heart disease Mother   . Heart disease Father   . Heart disease Sister   . Heart attack Sister   . Ulcerative colitis Brother   . Lung cancer Brother   . Thyroid cancer Sister   . Asthma Sister   . Diabetes Sister   . Asthma Sister   . Pancreatic cancer Sister   . Dementia Sister   . Asthma Brother   . Heart disease Brother   . Asthma Brother   . Lung cancer Brother   . Asthma Brother   . Lung cancer Brother   . Lung cancer Brother   . Rheum arthritis Brother    No Known Allergies  ? Current Facility-Administered Medications  Medication Dose Route Frequency Provider Last Rate Last Admin  . 0.9 %  sodium chloride infusion   Intravenous Once Gala Romney L, MD      . 0.9 %  sodium chloride infusion   Intravenous PRN Sharen Hones, MD   Stopped at 03/17/20 1749  . acetaminophen (TYLENOL) tablet 650 mg  650 mg Oral Q6H PRN Elwyn Reach, MD   650 mg at 03/18/20 0042   Or  . acetaminophen (TYLENOL) suppository 650 mg  650 mg Rectal Q6H PRN Gala Romney L, MD      . cefTRIAXone (ROCEPHIN) 2 g in sodium chloride 0.9 % 100 mL IVPB  2 g Intravenous Q24H Sharion Settler, NP 200 mL/hr at 03/18/20 0540 2 g at 03/18/20 0540  . Chlorhexidine Gluconate Cloth 2 % PADS 6 each  6 each Topical Daily Sharen Hones, MD   6 each at 03/18/20 1136  . cyclobenzaprine (FLEXERIL) tablet 10 mg  10 mg Oral  TID PRN Gala Romney L, MD      . enoxaparin (LOVENOX) injection 30 mg  30 mg Subcutaneous Q24H Hallaji, Sheema M, RPH   30 mg at 03/18/20 0030  . ipratropium-albuterol (DUONEB) 0.5-2.5 (3) MG/3ML nebulizer solution 3 mL  3 mL Nebulization Q2H PRN Sharion Settler, NP   3  mL at 03/16/20 0045  . metroNIDAZOLE (FLAGYL) IVPB 500 mg  500 mg Intravenous Q8H Gala Romney L, MD 100 mL/hr at 03/18/20 0739 500 mg at 03/18/20 0739  . montelukast (SINGULAIR) tablet 10 mg  10 mg Oral QHS PRN Gala Romney L, MD      . ondansetron (ZOFRAN) tablet 4 mg  4 mg Oral Q6H PRN Elwyn Reach, MD       Or  . ondansetron (ZOFRAN) injection 4 mg  4 mg Intravenous Q6H PRN Gala Romney L, MD      . pantoprazole (PROTONIX) EC tablet 40 mg  40 mg Oral Daily Gala Romney L, MD   40 mg at 03/18/20 1015  . polyethylene glycol (MIRALAX / GLYCOLAX) packet 17 g  17 g Oral Daily PRN Jonelle Sidle, Mohammad L, MD      . potassium chloride 10 mEq in 100 mL IVPB  10 mEq Intravenous Q1 Hr x 2 Sharen Hones, MD 100 mL/hr at 03/18/20 1135 10 mEq at 03/18/20 1135  . sodium chloride flush (NS) 0.9 % injection 10 mL  10 mL Intravenous Q12H Sharen Hones, MD   10 mL at 03/18/20 1016   Facility-Administered Medications Ordered in Other Encounters  Medication Dose Route Frequency Provider Last Rate Last Admin  . sodium chloride flush (NS) 0.9 % injection 10 mL  10 mL Intravenous PRN Lloyd Huger, MD   10 mL at 03/10/18 1200     Abtx:  Anti-infectives (From admission, onward)   Start     Dose/Rate Route Frequency Ordered Stop   03/17/20 0445  cefTRIAXone (ROCEPHIN) 2 g in sodium chloride 0.9 % 100 mL IVPB     Discontinue     2 g 200 mL/hr over 30 Minutes Intravenous Every 24 hours 03/17/20 0442     03/16/20 2200  ceFEPIme (MAXIPIME) 2 g in sodium chloride 0.9 % 100 mL IVPB  Status:  Discontinued        2 g 200 mL/hr over 30 Minutes Intravenous Every 12 hours 03/16/20 1312 03/17/20 0442   03/16/20 2000  vancomycin  (VANCOREADY) IVPB 500 mg/100 mL  Status:  Discontinued        500 mg 100 mL/hr over 60 Minutes Intravenous Every 24 hours 03/15/20 2059 03/17/20 0425   03/16/20 1800  ceFEPIme (MAXIPIME) 2 g in sodium chloride 0.9 % 100 mL IVPB  Status:  Discontinued        2 g 200 mL/hr over 30 Minutes Intravenous Every 24 hours 03/15/20 2101 03/16/20 1312   03/15/20 2300  ceFEPIme (MAXIPIME) 2 g in sodium chloride 0.9 % 100 mL IVPB        2 g 200 mL/hr over 30 Minutes Intravenous  Once 03/15/20 2258 03/16/20 0024   03/15/20 2300  metroNIDAZOLE (FLAGYL) IVPB 500 mg     Discontinue     500 mg 100 mL/hr over 60 Minutes Intravenous Every 8 hours 03/15/20 2258     03/15/20 2300  vancomycin (VANCOCIN) IVPB 1000 mg/200 mL premix        1,000 mg 200 mL/hr over 60 Minutes Intravenous  Once 03/15/20 2258 03/16/20 0059   03/15/20 1815  ceFEPIme (MAXIPIME) 2 g in sodium chloride 0.9 % 100 mL IVPB        2 g 200 mL/hr over 30 Minutes Intravenous  Once 03/15/20 1804 03/15/20 2040   03/15/20 1815  vancomycin (VANCOCIN) IVPB 1000 mg/200 mL premix        1,000 mg 200 mL/hr over  60 Minutes Intravenous  Once 03/15/20 1804 03/15/20 2040      REVIEW OF SYSTEMS:  Const: negative fever, negative chills, negative weight loss Eyes: negative diplopia or visual changes, negative eye pain ENT: negative coryza, negative sore throat Resp: negative cough, hemoptysis, dyspnea Cards: negative for chest pain, palpitations, lower extremity edema GU: negative for frequency, dysuria and hematuria GI: Negative for abdominal pain, diarrhea, bleeding, constipation Skin: negative for rash and pruritus Heme: negative for easy bruising and gum/nose bleeding MS: Fatigue and weakness. Neurolo:Memory issues Psych: negative for feelings of anxiety, depression  Endocrine: negative for thyroid, diabetes Allergy/Immunology- negative for any medication or food allergies ?  Objective:  VITALS:  BP 126/75 (BP Location: Right Arm)   Pulse  73   Temp (!) 97.5 F (36.4 C) (Oral)   Resp 18   Ht 5\' 1"  (1.549 m)   Wt (!) 43.1 kg   SpO2 100%   BMI 17.95 kg/m  PHYSICAL EXAM:  General: Alert, cooperative, no distress, appears stated age.  Head: Normocephalic, without obvious abnormality, atraumatic. Eyes: Conjunctivae clear, anicteric sclerae. Pupils are equal ENT Nares normal. No drainage or sinus tenderness. Lips, mucosa, and tongue normal. No Thrush Neck: Supple, symmetrical, no adenopathy, thyroid: non tender no carotid bruit and no JVD. Back: No CVA tenderness. Lungs: Clear to auscultation bilaterally. No Wheezing or Rhonchi. No rales. Heart: Regular rate and rhythm, no murmur, rub or gallop. Port on the chest wall Abdomen: Soft distended. Bowel sounds normal. No masses Extremities: atraumatic, no cyanosis. No edema. No clubbing Skin: No rashes or lesions. Or bruising Lymph: Cervical, supraclavicular normal. Neurologic: Grossly non-focal Pertinent Labs Lab Results CBC    Component Value Date/Time   WBC 10.3 03/17/2020 0602   RBC 2.94 (L) 03/17/2020 0602   HGB 8.5 (L) 03/17/2020 0602   HCT 24.9 (L) 03/17/2020 0602   HCT 24.2 (L) 05/21/2017 1257   PLT 220 03/17/2020 0602   MCV 84.7 03/17/2020 0602   MCH 28.9 03/17/2020 0602   MCHC 34.1 03/17/2020 0602   RDW 21.0 (H) 03/17/2020 0602   LYMPHSABS 0.2 (L) 03/17/2020 0602   MONOABS 0.2 03/17/2020 0602   EOSABS 0.0 03/17/2020 0602   BASOSABS 0.0 03/17/2020 0602    CMP Latest Ref Rng & Units 03/18/2020 03/17/2020 03/16/2020  Glucose 70 - 99 mg/dL 98 88 112(H)  BUN 8 - 23 mg/dL 25(H) 29(H) 28(H)  Creatinine 0.44 - 1.00 mg/dL 1.05(H) 1.02(H) 0.87  Sodium 135 - 145 mmol/L 131(L) 129(L) 128(L)  Potassium 3.5 - 5.1 mmol/L 3.3(L) 3.5 2.9(L)  Chloride 98 - 111 mmol/L 101 99 99  CO2 22 - 32 mmol/L 24 20(L) 20(L)  Calcium 8.9 - 10.3 mg/dL 7.9(L) 7.8(L) 7.8(L)  Total Protein 6.5 - 8.1 g/dL - - 5.2(L)  Total Bilirubin 0.3 - 1.2 mg/dL - - 1.2  Alkaline Phos 38 - 126 U/L -  - 66  AST 15 - 41 U/L - - 152(H)  ALT 0 - 44 U/L - - 88(H)      Microbiology: Recent Results (from the past 240 hour(s))  Culture, blood (Routine x 2)     Status: Abnormal (Preliminary result)   Collection Time: 03/15/20  5:48 PM   Specimen: BLOOD  Result Value Ref Range Status   Specimen Description   Final    BLOOD LARM Performed at [facility], [address] [place],  [address]    Special Requests   Final    BOTTLES DRAWN AEROBIC AND ANAEROBIC Blood Culture  adequate volume Performed at [facility], [address] [place], [place] [address]    Culture  Setup Time   Final    IN BOTH AEROBIC AND ANAEROBIC BOTTLES NO ORGANISMS SEEN Performed at [facility], [address] [place], [place] [address]    Culture STREPTOCOCCUS INTERMEDIUS (A)  Final   Report Status PENDING  Incomplete  Culture, blood (Routine x 2)     Status: Abnormal (Preliminary result)   Collection Time: 03/15/20  5:49 PM   Specimen: BLOOD  Result Value Ref Range Status   Specimen Description   Final    BLOOD RAC Performed at [facility], [address], [place], [place] [address]    Special Requests   Final    BOTTLES DRAWN AEROBIC AND ANAEROBIC Blood Culture adequate volume Performed at [facility], [address] [place], [place] [address]    Culture  Setup Time   Final    Organism ID to follow IN BOTH AEROBIC AND ANAEROBIC BOTTLES GRAM POSITIVE COCCI CRITICAL RESULT CALLED TO, READ BACK BY AND VERIFIED WITH: DAVID BESANTI @228  03/17/2020 TTG Performed at [facility], [address], [place], [place] [address]    Culture (A)  Final    STREPTOCOCCUS INTERMEDIUS SUSCEPTIBILITIES TO FOLLOW Performed at [facility], [place] [address] [place], [place] [address]    Report Status PENDING  Incomplete  Blood Culture ID Panel (Reflexed)     Status: Abnormal   Collection Time: 03/15/20  5:49 PM  Result Value Ref Range Status    Enterococcus species NOT DETECTED NOT DETECTED Final   Listeria monocytogenes NOT DETECTED NOT DETECTED Final   Staphylococcus species NOT DETECTED NOT DETECTED Final   Staphylococcus aureus (BCID) NOT DETECTED NOT DETECTED Final   Streptococcus species DETECTED (A) NOT DETECTED Final    Comment: Not Enterococcus species, Streptococcus agalactiae, Streptococcus pyogenes, or Streptococcus pneumoniae. CRITICAL RESULT CALLED TO, READ BACK BY AND VERIFIED WITH: DAVID BESANTI @[id number]  03/17/2020 TTG    Streptococcus agalactiae NOT DETECTED NOT DETECTED Final   Streptococcus pneumoniae NOT DETECTED NOT DETECTED Final   Streptococcus pyogenes NOT DETECTED NOT DETECTED Final   Acinetobacter baumannii NOT DETECTED NOT DETECTED Final   Enterobacteriaceae species NOT DETECTED NOT DETECTED Final   Enterobacter cloacae complex NOT DETECTED NOT DETECTED Final   Escherichia coli NOT DETECTED NOT DETECTED Final   Klebsiella oxytoca NOT DETECTED NOT DETECTED Final   Klebsiella pneumoniae NOT DETECTED NOT DETECTED Final   Proteus species NOT DETECTED NOT DETECTED Final   Serratia marcescens NOT DETECTED NOT DETECTED Final   Haemophilus influenzae NOT DETECTED NOT DETECTED Final   Neisseria meningitidis NOT DETECTED NOT DETECTED Final   Pseudomonas aeruginosa NOT DETECTED NOT DETECTED Final   Candida albicans NOT DETECTED NOT DETECTED Final   Candida glabrata NOT DETECTED NOT DETECTED Final   Candida krusei NOT DETECTED NOT DETECTED Final   Candida parapsilosis NOT DETECTED NOT DETECTED Final   Candida tropicalis NOT DETECTED NOT DETECTED Final    Comment: Performed at [facility], [address] [place], [place] [address]  SARS Coronavirus 2 by RT PCR (hospital order, performed in [place] hospital lab) Nasopharyngeal Nasopharyngeal Swab     Status: None   Collection Time: 03/15/20  8:42 PM   Specimen: Nasopharyngeal Swab  Result Value Ref Range Status   SARS Coronavirus 2 NEGATIVE  NEGATIVE Final    Comment: (NOTE) SARS-CoV-2 target nucleic acids are NOT DETECTED.  The SARS-CoV-2 RNA  is generally detectable in upper and lower respiratory specimens during the acute phase of infection. The lowest concentration of SARS-CoV-2 viral copies this assay can detect is 250 copies / mL. A negative result does not preclude SARS-CoV-2 infection and should not be used as the sole basis for treatment or other patient management decisions.  A negative result may occur with improper specimen collection / handling, submission of specimen other than nasopharyngeal swab, presence of viral mutation(s) within the areas targeted by this assay, and inadequate number of viral copies (<250 copies / mL). A negative result must be combined with clinical observations, patient history, and epidemiological information.  Fact Sheet for Patients:   StrictlyIdeas.no  Fact Sheet for Healthcare Providers: BankingDealers.co.za  This test is not yet approved or  cleared by the [place] FDA and has been authorized for detection and/or diagnosis of SARS-CoV-2 by FDA under an Emergency Use Authorization (EUA).  This EUA will remain in effect (meaning this test can be used) for the duration of the COVID-19 declaration under Section 564(b)(1) of the Act, 21 U.S.C. section 360bbb-3(b)(1), unless the authorization is terminated or revoked sooner.  Performed at [facility], [address] [place], [place] [address]   Urine culture     Status: None   Collection Time: 03/15/20  8:42 PM   Specimen: Urine, Random  Result Value Ref Range Status   Specimen Description   Final    URINE, RANDOM Performed at [facility], [address], [place], [place] [address]    Special Requests   Final    NONE Performed at [facility], [address], [place], [place] [address]    Culture   Final    NO GROWTH Performed at [facility], [place] [address], [place], [place] [address]    Report Status 03/17/2020 FINAL  Final  MRSA PCR Screening     Status: None   Collection Time: 03/16/20  5:00 AM   Specimen: Nasopharyngeal  Result Value Ref Range Status   MRSA by PCR NEGATIVE NEGATIVE Final    Comment:        The GeneXpert MRSA Assay (FDA approved for NASAL specimens only), is one component of a comprehensive MRSA colonization surveillance program. It is not intended to diagnose MRSA infection nor to guide or monitor treatment for MRSA infections. Performed at [facility], [address] [place], [place] [address]   CULTURE, BLOOD (ROUTINE X 2) w Reflex to ID Panel     Status: None (Preliminary result)   Collection Time: 03/17/20  7:44 AM   Specimen: BLOOD  Result Value Ref Range Status   Specimen Description BLOOD LFA  Final   Special Requests   Final    BOTTLES DRAWN AEROBIC AND ANAEROBIC Blood Culture adequate volume   Culture   Final    NO GROWTH < 24 HOURS Performed at [facility], [address], [place], [place] [address]    Report Status PENDING  Incomplete  CULTURE, BLOOD (ROUTINE X 2) w Reflex to ID Panel     Status: None (Preliminary result)   Collection Time: 03/17/20  7:51 AM   Specimen: BLOOD  Result Value Ref Range Status   Specimen Description BLOOD RAC  Final   Special Requests   Final    BOTTLES DRAWN AEROBIC AND ANAEROBIC Blood Culture adequate volume   Culture   Final    NO GROWTH < 24 HOURS Performed at [facility], [address] [place], [place] [address]    Report Status PENDING  Incomplete    IMAGING RESULTS:  I have personally reviewed the films ? Impression/Recommendation  86 year old female with history of stage IIIa ovarian carcinoma on chemotherapy admitted with fever.   Streptococcus intermedius bacteremia.  Patient has multiple lesions in the liver and we need to rule out hepatic abscesses. I doubt port is the source  for this infection.  And currently it need not be removed. Patient needs aspiration of the hepatic lesions and if it is purulent will need a drain placed so that the largest of the lesions can be drained. Currently on ceftriaxone and Flagyl.  Await susceptibility  If the liver aspiration does not show any purulence then she may  need work-up like TEE.  ? ?Stage IIIa ovarian cancer with peritoneal mets and splenorenal ligament lesion.  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  Note:  This document was prepared using Dragon voice recognition software and may include unintentional dictation errors.

## 2020-03-18 NOTE — Progress Notes (Signed)
PROGRESS NOTE    Tamara Mckinney  FPU:[id number] DOB: [birth year] DOA: 03/15/2020 PCP: [name], MD   Chief complaint.  Fever and chills.   Brief Narrative:  Tamara Mckinney a 86 y.o.femalewith medical history significant ofovarian cancer on chemotherapy, GERD, hypertension, asthma, lupus anticoagulant osteoarthritis, anemia of chronic disease, who was seen yesterday with her cycle 6 chemotherapy and has been consistently doing fine until this morning when she started having fever.Patient is a poor historian, could not provide additional information.  7/31.Due to suspicion of liver abscess, obtain liver ultrasound. Continue coverage with cefepime and Flagyl. Also on vancomycin. He developed significant short of breath and hypoxemia after IV fluids, received IV Lasix. Currently off oxygen, no significant short of breath.  8/1.  Right upper quadrant ultrasound did not show any evidence of abscess.  Blood culture came back with Streptococcus, pending final results.  Antibiotic switched to Rocephin.  8/2.  ID consult obtained.  Scheduled liver aspiration to rule out liver abscess.  Continue Rocephin.   Assessment & Plan:   Principal Problem:   Sepsis ([place]) Active Problems:   Anemia   Malignant neoplasm of ovary (HCC)   Asthma   Esophageal reflux   Essential hypertension   Fibromyalgia   Sjogren's disease ([place])   Hyponatremia   Leucocytosis   Severe protein-calorie malnutrition [name]: less than 60% of standard weight) ([place])   Acute hypoxemic respiratory failure (HCC)   Streptococcal sepsis, unspecified ([place])  #1.  Streptococcus septicemia. Discussed with infectious disease, still concerning for liver abscess.  Reviewed echocardiogram, ejection fraction 60 to 65%, trivial mitral regurgitation, aortic valve not well visualized.  Discussed with ID, still concerning for liver abscess.  Scheduled for liver aspiration tomorrow by Dr. Pascal Lux.  Continue Rocephin.  2.   Acute hypoxemic respite failure secondary to volume overload. Condition resolved.  3.  Iron deficient anemia. Status post IV iron.  4.  Hyponatremia. Stable.  5.  Ovarian cancer. Patient has been followed by oncology as an inpatient.  6.  Bronchospasm. Continue Combivent.  Also add oral steroids.  7.  Left shoulder pain. Patient has a chronic left shoulder pain, worse this morning.  Obtain x-ray, placed on Lidoderm patch on the shoulder.   DVT prophylaxis:Lovenox Code Status:Full Family Communication:Son at bedside. Disposition Plan:  Patient came from:Home  Anticipated d/c place:  Barriers to d/c OR conditions which need to be met to effect a safe d/c:   Consultants:  None  Procedures:None Antimicrobials: Rocephin.     Subjective: Patient has some wheezing and shortness of breath today.  No chest pain.  No cough. Complaint left shoulder pain.  She has chronic left shoulder pain, worse since this morning. No fever or chills. No dysuria hematuria.  Objective: Vitals:   03/17/20 2352 03/18/20 0617 03/18/20 0839 03/18/20 1320  BP: (!) 129/87 (!) 129/72 126/75 138/90  Pulse: 97 78 73 92  Resp: _0 Temp: 98.5 F (36.9 C) 97.6 F (36.4 C) (!) 97.5 F (36.4 C) 98.6 F (37 C)  TempSrc: Oral Oral Oral Oral  SpO2: 100% 99% 100% 97%  Weight:      Height:        Intake/Output Summary (Last 24 hours) at 03/18/2020 1512 Last data filed at 03/18/2020 0604 Gross per 24 hour  Intake 109.86 ml  Output 850 ml  Net -740.14 ml   Filed Weights   03/15/20 1733  Weight: (!) 43.1 kg    Examination:  General exam: Appears calm  and comfortable  Respiratory system: Some wheezing, no crackles. Respiratory effort normal. Cardiovascular system: S1 & S2 heard, RRR. No JVD, murmurs, rubs, gallops or clicks. No pedal  edema. Gastrointestinal system: Abdomen is nondistended, soft and nontender. No organomegaly or masses felt. Normal bowel sounds heard. Central nervous system: Alert and oriented. No focal neurological deficits. Extremities: Symmetric  Skin: No rashes, lesions or ulcers Psychiatry: Judgement and insight appear normal. Mood & affect appropriate.     Data Reviewed: I have personally reviewed following labs and imaging studies  CBC: Recent Labs  Lab 03/14/20 1005 03/15/20 1748 03/16/20 0502 03/17/20 0602  WBC 18.5* 18.8* 15.6* 10.3  NEUTROABS 16.4* 17.9*  --  8.8*  HGB 8.9* 7.4* 8.5* 8.5*  HCT 26.7* 21.5* 26.0* 24.9*  MCV 93.7 90.7 88.7 84.7  PLT 518* 306 262 858   Basic Metabolic Panel: Recent Labs  Lab 03/14/20 1005 03/15/20 1748 03/16/20 0502 03/17/20 0602 03/18/20 0642  NA 129* 129* 128* 129* 131*  K 4.5 3.8 2.9* 3.5 3.3*  CL 96* 101 99 99 101  CO2 24 19* 20* 20* 24  GLUCOSE 139* 120* 112* 88 98  BUN 26* 37* 28* 29* 25*  CREATININE 0.97 1.02* 0.87 1.02* 1.05*  CALCIUM 8.5* 7.7* 7.8* 7.8* 7.9*  MG  --   --   --  1.7 1.7  PHOS  --   --   --   --  2.7   GFR: Estimated Creatinine Clearance: 24.2 mL/min (A) (by C-G formula based on SCr of 1.05 mg/dL (H)). Liver Function Tests: Recent Labs  Lab 03/14/20 1005 03/15/20 1748 03/16/20 0502  AST 28 149* 152*  ALT 14 74* 88*  ALKPHOS 77 86 66  BILITOT 0.7 0.7 1.2  PROT 6.6 5.6* 5.2*  ALBUMIN 3.6 2.8* 2.6*   No results for input(s): LIPASE, AMYLASE in the last 168 hours. No results for input(s): AMMONIA in the last 168 hours. Coagulation Profile: Recent Labs  Lab 03/15/20 1748 03/16/20 0502  INR 1.3* 1.4*   Cardiac Enzymes: No results for input(s): CKTOTAL, CKMB, CKMBINDEX, TROPONINI in the last 168 hours. BNP (last 3 results) No results for input(s): PROBNP in the last 8760 hours. HbA1C: No results for input(s): HGBA1C in the last 72 hours. CBG: Recent Labs  Lab 03/16/20 0324 03/18/20 0734  GLUCAP  107* 70   Lipid Profile: No results for input(s): CHOL, HDL, LDLCALC, TRIG, CHOLHDL, LDLDIRECT in the last 72 hours. Thyroid Function Tests: No results for input(s): TSH, T4TOTAL, FREET4, T3FREE, THYROIDAB in the last 72 hours. Anemia Panel: Recent Labs    03/16/20 0502 03/16/20 0506  VITAMINB12  --  421  TIBC 221*  --   IRON 10*  --    Sepsis Labs: Recent Labs  Lab 03/15/20 1748 03/15/20 2042 03/16/20 0502 03/18/20 0642  PROCALCITON  --   --  28.40 13.72  LATICACIDVEN 1.6 1.2  --   --     Recent Results (from the past 240 hour(s))  Culture, blood (Routine x 2)     Status: Abnormal (Preliminary result)   Collection Time: 03/15/20  5:48 PM   Specimen: BLOOD  Result Value Ref Range Status   Specimen Description   Final    BLOOD LARM Performed at [facility], [address], [place], [place] [address]    Special Requests   Final    BOTTLES DRAWN AEROBIC AND ANAEROBIC Blood Culture adequate volume Performed at [facility], [address] [place], [place] [address]  Culture  Setup Time   Final    IN BOTH AEROBIC AND ANAEROBIC BOTTLES NO ORGANISMS SEEN Performed at [facility], [address] [place], [place] [address]    Culture STREPTOCOCCUS INTERMEDIUS (A)  Final   Report Status PENDING  Incomplete  Culture, blood (Routine x 2)     Status: Abnormal (Preliminary result)   Collection Time: 03/15/20  5:49 PM   Specimen: BLOOD  Result Value Ref Range Status   Specimen Description   Final    BLOOD RAC Performed at [facility], [address] [place], [place] [address]    Special Requests   Final    BOTTLES DRAWN AEROBIC AND ANAEROBIC Blood Culture adequate volume Performed at [facility], [address] [place], [place] [address]    Culture  Setup Time   Final    Organism ID to follow IN BOTH AEROBIC AND ANAEROBIC BOTTLES GRAM POSITIVE COCCI CRITICAL RESULT CALLED TO, READ BACK BY AND VERIFIED WITH:  DAVID BESANTI '@228'$  03/17/2020 TTG Performed at [facility], [address] [place], [place] [address]    Culture (A)  Final    STREPTOCOCCUS INTERMEDIUS SUSCEPTIBILITIES TO FOLLOW Performed at [facility], [place] [address] [place], [place] [address]    Report Status PENDING  Incomplete  Blood Culture ID Panel (Reflexed)     Status: Abnormal   Collection Time: 03/15/20  5:49 PM  Result Value Ref Range Status   Enterococcus species NOT DETECTED NOT DETECTED Final   Listeria monocytogenes NOT DETECTED NOT DETECTED Final   Staphylococcus species NOT DETECTED NOT DETECTED Final   Staphylococcus aureus (BCID) NOT DETECTED NOT DETECTED Final   Streptococcus species DETECTED (A) NOT DETECTED Final    Comment: Not Enterococcus species, Streptococcus agalactiae, Streptococcus pyogenes, or Streptococcus pneumoniae. CRITICAL RESULT CALLED TO, READ BACK BY AND VERIFIED WITH: DAVID BESANTI '@[id number]'$  03/17/2020 TTG    Streptococcus agalactiae NOT DETECTED NOT DETECTED Final   Streptococcus pneumoniae NOT DETECTED NOT DETECTED Final   Streptococcus pyogenes NOT DETECTED NOT DETECTED Final   Acinetobacter baumannii NOT DETECTED NOT DETECTED Final   Enterobacteriaceae species NOT DETECTED NOT DETECTED Final   Enterobacter cloacae complex NOT DETECTED NOT DETECTED Final   Escherichia coli NOT DETECTED NOT DETECTED Final   Klebsiella oxytoca NOT DETECTED NOT DETECTED Final   Klebsiella pneumoniae NOT DETECTED NOT DETECTED Final   Proteus species NOT DETECTED NOT DETECTED Final   Serratia marcescens NOT DETECTED NOT DETECTED Final   Haemophilus influenzae NOT DETECTED NOT DETECTED Final   Neisseria meningitidis NOT DETECTED NOT DETECTED Final   Pseudomonas aeruginosa NOT DETECTED NOT DETECTED Final   Candida albicans NOT DETECTED NOT DETECTED Final   Candida glabrata NOT DETECTED NOT DETECTED Final   Candida krusei NOT DETECTED NOT DETECTED Final   Candida parapsilosis NOT DETECTED NOT  DETECTED Final   Candida tropicalis NOT DETECTED NOT DETECTED Final    Comment: Performed at [facility], [address] [place],  [address]  SARS Coronavirus 2 by RT PCR (hospital order, performed in [place] hospital lab) Nasopharyngeal Nasopharyngeal Swab     Status: None   Collection Time: 03/15/20  8:42 PM   Specimen: Nasopharyngeal Swab  Result Value Ref Range Status   SARS Coronavirus 2 NEGATIVE NEGATIVE Final    Comment: (NOTE) SARS-CoV-2 target nucleic acids are NOT DETECTED.  The SARS-CoV-2 RNA is generally detectable in upper and lower respiratory specimens during the acute phase of infection. The lowest  concentration of SARS-CoV-2 viral copies this assay can detect is 250 copies / mL. A negative result does not preclude SARS-CoV-2 infection and should not be used as the sole basis for treatment or other patient management decisions.  A negative result may occur with improper specimen collection / handling, submission of specimen other than nasopharyngeal swab, presence of viral mutation(s) within the areas targeted by this assay, and inadequate number of viral copies (<250 copies / mL). A negative result must be combined with clinical observations, patient history, and epidemiological information.  Fact Sheet for Patients:   StrictlyIdeas.no  Fact Sheet for Healthcare Providers: BankingDealers.co.za  This test is not yet approved or  cleared by the [place] FDA and has been authorized for detection and/or diagnosis of SARS-CoV-2 by FDA under an Emergency Use Authorization (EUA).  This EUA will remain in effect (meaning this test can be used) for the duration of the COVID-19 declaration under Section 564(b)(1) of the Act, 21 U.S.C. section 360bbb-3(b)(1), unless the authorization is terminated or revoked sooner.  Performed at [facility], [address] [place], [place] [address]    Urine culture     Status: None   Collection Time: 03/15/20  8:42 PM   Specimen: Urine, Random  Result Value Ref Range Status   Specimen Description   Final    URINE, RANDOM Performed at [facility], [address], [place], [place] [address]    Special Requests   Final    NONE Performed at [facility], [address], [place], [name] [address]    Culture   Final    NO GROWTH Performed at [facility], [place] [address], [place], [place] [address]    Report Status 03/17/2020 FINAL  Final  MRSA PCR Screening     Status: None   Collection Time: 03/16/20  5:00 AM   Specimen: Nasopharyngeal  Result Value Ref Range Status   MRSA by PCR NEGATIVE NEGATIVE Final    Comment:        The GeneXpert MRSA Assay (FDA approved for NASAL specimens only), is one component of a comprehensive MRSA colonization surveillance program. It is not intended to diagnose MRSA infection nor to guide or monitor treatment for MRSA infections. Performed at [facility], [address] [place], [place] [address]   CULTURE, BLOOD (ROUTINE X 2) w Reflex to ID Panel     Status: None (Preliminary result)   Collection Time: 03/17/20  7:44 AM   Specimen: BLOOD  Result Value Ref Range Status   Specimen Description BLOOD LFA  Final   Special Requests   Final    BOTTLES DRAWN AEROBIC AND ANAEROBIC Blood Culture adequate volume   Culture   Final    NO GROWTH < 24 HOURS Performed at [facility], [address] [place], [place] [address]    Report Status PENDING  Incomplete  CULTURE, BLOOD (ROUTINE X 2) w Reflex to ID Panel     Status: None (Preliminary result)   Collection Time: 03/17/20  7:51 AM   Specimen: BLOOD  Result Value Ref Range Status   Specimen Description BLOOD RAC  Final   Special Requests   Final    BOTTLES DRAWN AEROBIC AND ANAEROBIC Blood Culture adequate volume   Culture   Final    NO GROWTH < 24 HOURS Performed at [facility],  [address], [place],  [address]    Report Status PENDING  Incomplete  Radiology Studies: ECHOCARDIOGRAM COMPLETE  Result Date: 03/17/2020    ECHOCARDIOGRAM REPORT   Patient Name:   Tamara Mckinney Date of Exam: 03/17/2020 Medical Rec #:  [id number]      Height:       61.0 in Accession #:    [id number]     Weight:       95.0 lb Date of Birth:  09-27-1927      BSA:          1.376 m Patient Age:    53 years       BP:           124/76 mmHg Patient Gender: F              HR:           90 bpm. Exam Location:  [facility] Procedure: 2D Echo Indications:     CHF-ACUTE DIASTOLIC 916.38/G66.59  History:         Patient has no prior history of Echocardiogram examinations.                  Risk Factors:Hypertension. ASTHMA.  Sonographer:     Avanell Shackleton Referring Phys:  [id number] Sharen Hones Diagnosing Phys: Kirk Ruths MD IMPRESSIONS  1. Left ventricular ejection fraction, by estimation, is 60 to 65%. The left ventricle has normal function. The left ventricle has no regional wall motion abnormalities. There is mild left ventricular hypertrophy. Left ventricular diastolic parameters are consistent with Grade I diastolic dysfunction (impaired relaxation). Elevated left atrial pressure.  2. Right ventricular systolic function is normal. The right ventricular size is normal. There is normal pulmonary artery systolic pressure.  3. Left atrial size was mildly dilated.  4. Moderate pleural effusion in the left lateral region.  5. The mitral valve is normal in structure. Trivial mitral valve regurgitation. No evidence of mitral stenosis.  6. The aortic valve was not well visualized. Aortic valve regurgitation is not visualized. No aortic stenosis is present.  7. The inferior vena cava is normal in size with greater than 50% respiratory variability, suggesting right atrial pressure of 3 mmHg. FINDINGS  Left Ventricle: Left ventricular ejection fraction, by estimation, is 60 to 65%. The left ventricle has normal  function. The left ventricle has no regional wall motion abnormalities. The left ventricular internal cavity size was normal in size. There is  mild left ventricular hypertrophy. Left ventricular diastolic parameters are consistent with Grade I diastolic dysfunction (impaired relaxation). Elevated left atrial pressure. Right Ventricle: The right ventricular size is normal.Right ventricular systolic function is normal. There is normal pulmonary artery systolic pressure. The tricuspid regurgitant velocity is 2.62 m/s, and with an assumed right atrial pressure of 3 mmHg, the estimated right ventricular systolic pressure is 79.3 mmHg. Left Atrium: Left atrial size was mildly dilated. Right Atrium: Right atrial size was normal in size. Pericardium: There is no evidence of pericardial effusion. Mitral Valve: The mitral valve is normal in structure. Normal mobility of the mitral valve leaflets. Mild mitral annular calcification. Trivial mitral valve regurgitation. No evidence of mitral valve stenosis. Tricuspid Valve: The tricuspid valve is normal in structure. Tricuspid valve regurgitation is mild . No evidence of tricuspid stenosis. Aortic Valve: The aortic valve was not well visualized. Aortic valve regurgitation is not visualized. No aortic stenosis is present. Aortic valve mean gradient measures 7.7 mmHg. Aortic valve peak gradient measures 14.0 mmHg. Aortic valve area, by VTI measures 1.30 cm. Pulmonic Valve: The pulmonic valve was not well visualized.  Pulmonic valve regurgitation is not visualized. No evidence of pulmonic stenosis. Aorta: The aortic root is normal in size and structure. Venous: The inferior vena cava is normal in size with greater than 50% respiratory variability, suggesting right atrial pressure of 3 mmHg. IAS/Shunts: No atrial level shunt detected by color flow Doppler. Additional Comments: There is a moderate pleural effusion in the left lateral region.  LEFT VENTRICLE PLAX 2D LVIDd:          3.42 cm  Diastology LVIDs:         2.04 cm  LV e' lateral:   6.42 cm/s LV PW:         0.75 cm  LV E/e' lateral: 19.0 LV IVS:        1.08 cm  LV e' medial:    7.83 cm/s LVOT diam:     1.50 cm  LV E/e' medial:  15.6 LV SV:         47 LV SV Index:   34 LVOT Area:     1.77 cm  IVC IVC diam: 1.69 cm LEFT ATRIUM             Index       RIGHT ATRIUM           Index LA diam:        3.60 cm 2.62 cm/m  RA Area:     12.00 cm LA Vol (A2C):   51.2 ml 37.20 ml/m RA Volume:   23.00 ml  16.71 ml/m LA Vol (A4C):   39.7 ml 28.84 ml/m LA Biplane Vol: 46.7 ml 33.93 ml/m  AORTIC VALVE AV Area (Vmax):    1.19 cm AV Area (Vmean):   1.28 cm AV Area (VTI):     1.30 cm AV Vmax:           187.33 cm/s AV Vmean:          130.000 cm/s AV VTI:            0.359 m AV Peak Grad:      14.0 mmHg AV Mean Grad:      7.7 mmHg LVOT Vmax:         126.50 cm/s LVOT Vmean:        94.250 cm/s LVOT VTI:          0.264 m LVOT/AV VTI ratio: 0.73  AORTA Ao Root diam: 2.70 cm MITRAL VALVE                TRICUSPID VALVE MV Area (PHT): 4.10 cm     TR Peak grad:   27.5 mmHg MV Decel Time: 185 msec     TR Vmax:        262.00 cm/s MV E velocity: 122.00 cm/s MV A velocity: 157.00 cm/s  SHUNTS MV E/A ratio:  0.78         Systemic VTI:  0.26 m                             Systemic Diam: 1.50 cm Kirk Ruths MD Electronically signed by Kirk Ruths MD Signature Date/Time: 03/17/2020/3:04:49 PM    Final         Scheduled Meds: . Chlorhexidine Gluconate Cloth  6 each Topical Daily  . enoxaparin (LOVENOX) injection  30 mg Subcutaneous Q24H  . lidocaine  1 patch Transdermal Q24H  . pantoprazole  40 mg Oral Daily  . predniSONE  40 mg Oral Q breakfast  .  sodium chloride flush  10 mL Intravenous Q12H   Continuous Infusions: . sodium chloride    . sodium chloride Stopped (03/17/20 1749)  . cefTRIAXone (ROCEPHIN)  IV 2 g (03/18/20 0540)  . metronidazole 500 mg (03/18/20 1507)     LOS: 3 days    Time spent: 34 minutes    Sharen Hones, MD [facility]   To contact the attending provider between 7A-7P or the covering provider during after hours 7P-7A, please log into the web site [email or web address] and access using universal [place] password for that web site. If you do not have the password, please call the hospital operator.  03/18/2020, 3:12 PM

## 2020-03-18 NOTE — Consult Note (Signed)
[place]  [phone]) [phone] [phone]) [phone]  ID: Tamara Mckinney OB: 08-Feb-1928  MR#: [id number]  JJK#:[id number]  Patient Care Team: [name], MD as PCP - General (Family Medicine) [name], RN as Registered Nurse Herbert Pun, MD as Consulting Physician (General Surgery) Lloyd Huger, MD as Consulting Physician (Oncology)  CHIEF COMPLAINT: Recurrent ovarian cancer, bacteremia.   INTERVAL HISTORY: Patient is a 86 year old female with recurrent ovarian cancer active receiving single agent gemcitabine for palliative treatment.  Her last infusion was on March 14, 2020.  Patient tolerated her infusion well, but then the following day was found to be on the floor of her bathroom with fevers.  She continues to have significant weakness and fatigue, but feels much improved since admission.  She has no neurologic complaints.  She denies any further fevers.  She has a good appetite and denies weight loss.  She has no chest pain, shortness of breath, cough, or hemoptysis.  She denies any nausea, vomiting, constipation, or diarrhea.  She has no urinary complaints.  Patient offers no further specific complaints today.  REVIEW OF SYSTEMS:   Review of Systems  Constitutional: Positive for fever and malaise/fatigue. Negative for weight loss.  Respiratory: Negative.  Negative for cough, hemoptysis and shortness of breath.   Cardiovascular: Negative.  Negative for chest pain and leg swelling.  Gastrointestinal: Negative.  Negative for abdominal pain and nausea.  Genitourinary: Negative.  Negative for dysuria.  Musculoskeletal: Negative.  Negative for back pain.  Skin: Negative.  Negative for rash.  Neurological: Positive for weakness. Negative for dizziness, focal weakness and headaches.  Psychiatric/Behavioral: Negative.  The patient is not nervous/anxious.     As per HPI. Otherwise, a complete review of systems is negative.  PAST MEDICAL  HISTORY: Past Medical History:  Diagnosis Date  . Anemia   . Arthritis   . Asthma   . Dyspnea   . GERD (gastroesophageal reflux disease)   . Hypertension   . Ovarian cancer ([place]) 2018    PAST SURGICAL HISTORY: Past Surgical History:  Procedure Laterality Date  . ABDOMINAL HYSTERECTOMY    . BREAST BIOPSY     x6  . BUNIONECTOMY Bilateral   . CATARACT EXTRACTION, BILATERAL    . FLEXIBLE SIGMOIDOSCOPY N/A 05/21/2017   Procedure: FLEXIBLE SIGMOIDOSCOPY;  Surgeon: Lin Landsman, MD;  Location: [facility] ENDOSCOPY;  Service: Gastroenterology;  Laterality: N/A;  . INCONTINENCE SURGERY    . PORTA CATH INSERTION N/A 06/16/2017   Procedure: PORTA CATH INSERTION;  Surgeon: Algernon Huxley, MD;  Location: [place] [facility] LAB;  Service: Cardiovascular;  Laterality: N/A;  . RECTAL SURGERY  04/2018  . REPAIR OF RECTAL PROLAPSE N/A 05/11/2017   Procedure: REPAIR OF RECTAL PROLAPSE;  Surgeon: Leonie Green, MD;  Location: [facility] ORS;  Service: General;  Laterality: N/A;  . TONSILLECTOMY    . VAGINA SURGERY     uncertain procedure performed    FAMILY HISTORY: Family History  Problem Relation Age of Onset  . Heart disease Mother   . Heart disease Father   . Heart disease Sister   . Heart attack Sister   . Ulcerative colitis Brother   . Lung cancer Brother   . Thyroid cancer Sister   . Asthma Sister   . Diabetes Sister   . Asthma Sister   . Pancreatic cancer Sister   . Dementia Sister   . Asthma Brother   . Heart disease Brother   . Asthma Brother   .  Lung cancer Brother   . Asthma Brother   . Lung cancer Brother   . Lung cancer Brother   . Rheum arthritis Brother     ADVANCED DIRECTIVES (Y/N):  @ADVDIR @  HEALTH MAINTENANCE: Social History   Tobacco Use  . Smoking status: Never Smoker  . Smokeless tobacco: Never Used  Vaping Use  . Vaping Use: Never used  Substance Use Topics  . Alcohol use: No  . Drug use: No     Colonoscopy:  PAP:  Bone density:  Lipid  panel:  No Known Allergies  Current Facility-Administered Medications  Medication Dose Route Frequency Provider Last Rate Last Admin  . 0.9 %  sodium chloride infusion   Intravenous Once Gala Romney L, MD      . 0.9 %  sodium chloride infusion   Intravenous PRN Sharen Hones, MD   Stopped at 03/17/20 1749  . acetaminophen (TYLENOL) tablet 650 mg  650 mg Oral Q6H PRN Elwyn Reach, MD   650 mg at 03/18/20 1435   Or  . acetaminophen (TYLENOL) suppository 650 mg  650 mg Rectal Q6H PRN Gala Romney L, MD      . cefTRIAXone (ROCEPHIN) 2 g in sodium chloride 0.9 % 100 mL IVPB  2 g Intravenous Q24H Sharion Settler, NP 200 mL/hr at 03/18/20 0540 2 g at 03/18/20 0540  . Chlorhexidine Gluconate Cloth 2 % PADS 6 each  6 each Topical Daily Sharen Hones, MD   6 each at 03/18/20 1136  . cyclobenzaprine (FLEXERIL) tablet 10 mg  10 mg Oral TID PRN Gala Romney L, MD      . enoxaparin (LOVENOX) injection 30 mg  30 mg Subcutaneous Q24H Hallaji, Sheema M, RPH   30 mg at 03/18/20 0030  . ipratropium-albuterol (DUONEB) 0.5-2.5 (3) MG/3ML nebulizer solution 3 mL  3 mL Nebulization Q2H PRN Sharion Settler, NP   3 mL at 03/16/20 0045  . lidocaine (LIDODERM) 5 % 1 patch  1 patch Transdermal Q24H Sharen Hones, MD      . metroNIDAZOLE (FLAGYL) IVPB 500 mg  500 mg Intravenous Q8H Garba, Mohammad L, MD 100 mL/hr at 03/18/20 1507 500 mg at 03/18/20 1507  . montelukast (SINGULAIR) tablet 10 mg  10 mg Oral QHS PRN Gala Romney L, MD      . ondansetron (ZOFRAN) tablet 4 mg  4 mg Oral Q6H PRN Elwyn Reach, MD       Or  . ondansetron (ZOFRAN) injection 4 mg  4 mg Intravenous Q6H PRN Gala Romney L, MD      . pantoprazole (PROTONIX) EC tablet 40 mg  40 mg Oral Daily Gala Romney L, MD   40 mg at 03/18/20 1015  . polyethylene glycol (MIRALAX / GLYCOLAX) packet 17 g  17 g Oral Daily PRN Gala Romney L, MD      . predniSONE (DELTASONE) tablet 40 mg  40 mg Oral Q breakfast Sharen Hones, MD      .  sodium chloride flush (NS) 0.9 % injection 10 mL  10 mL Intravenous Q12H Sharen Hones, MD   10 mL at 03/18/20 1016   Facility-Administered Medications Ordered in Other Encounters  Medication Dose Route Frequency Provider Last Rate Last Admin  . sodium chloride flush (NS) 0.9 % injection 10 mL  10 mL Intravenous PRN Lloyd Huger, MD   10 mL at 03/10/18 1200    OBJECTIVE: Vitals:   03/18/20 0839 03/18/20 1320  BP: 126/75 138/90  Pulse: 73 92  Resp: 18 18  Temp: (!) 97.5 F (36.4 C) 98.6 F (37 C)  SpO2: 100% 97%     Body mass index is 17.95 kg/m.    ECOG FS:3 - Symptomatic, >50% confined to bed  General: Thin, no acute distress.  Resting in bed comfortably. Eyes: Pink conjunctiva, anicteric sclera. HEENT: Normocephalic, moist mucous membranes. Lungs: No audible wheezing or coughing. Heart: Regular rate and rhythm. Abdomen: Soft, nontender, no obvious distention. Musculoskeletal: No edema, cyanosis, or clubbing. Neuro: Alert, answering all questions appropriately. Cranial nerves grossly intact. Skin: No rashes or petechiae noted. Psych: Normal affect.  LAB RESULTS:  Lab Results  Component Value Date   NA 131 (L) 03/18/2020   K 3.3 (L) 03/18/2020   CL 101 03/18/2020   CO2 24 03/18/2020   GLUCOSE 98 03/18/2020   BUN 25 (H) 03/18/2020   CREATININE 1.05 (H) 03/18/2020   CALCIUM 7.9 (L) 03/18/2020   PROT 5.2 (L) 03/16/2020   ALBUMIN 2.6 (L) 03/16/2020   AST 152 (H) 03/16/2020   ALT 88 (H) 03/16/2020   ALKPHOS 66 03/16/2020   BILITOT 1.2 03/16/2020   GFRNONAA 47 (L) 03/18/2020   GFRAA 54 (L) 03/18/2020    Lab Results  Component Value Date   WBC 10.3 03/17/2020   NEUTROABS 8.8 (H) 03/17/2020   HGB 8.5 (L) 03/17/2020   HCT 24.9 (L) 03/17/2020   MCV 84.7 03/17/2020   PLT 220 03/17/2020     STUDIES: CT ABDOMEN PELVIS W CONTRAST  Result Date: 03/15/2020 CLINICAL DATA:  Abdominal abscess or infection suspected. EXAM: CT ABDOMEN AND PELVIS WITH CONTRAST  TECHNIQUE: Multidetector CT imaging of the abdomen and pelvis was performed using the standard protocol following bolus administration of intravenous contrast. CONTRAST:  4mL OMNIPAQUE IOHEXOL 300 MG/ML  SOLN COMPARISON:  November 14, 2019 FINDINGS: Lower chest: Hiatal hernia similar to prior study. No consolidation. No pleural effusion. Hepatobiliary: New hepatic masses. (Image 19, series 2) 5.2 x 4.7 cm. This is in the RIGHT hemi liver. And RIGHT hepatic vein and portal veins can be seen passing through this area. Another lesion measuring 2.4 cm in the anterior RIGHT hemi liver (image 15, series 2) 2 additional foci in the central RIGHT liver and 2 more near the dome of the RIGHT hemi liver of similar size. These are low-density and septate. Surrounding edema in the liver is not appreciated. No significant biliary duct dilation or pericholecystic stranding. Pancreas: Pancreas is largely encased in the distal splenorenal ligament by tumor implant that is now more confluent than on the previous study. This measures approximately 7.7 x 5.0 cm as compared to 9.3 x 5.5 cm. This shows some small locules of gas within the lateral portion of the tumor on image 16 of series 2, this is of uncertain significance adjacent to the splenic flexure. Spleen: Splenic hilum infiltrated by tumor with similar appearance as described. Adrenals/Urinary Tract: RIGHT adrenal lesion unchanged. Symmetric renal enhancement. No hydronephrosis. Urinary is distended and extends below the pelvic floor. Stomach/Bowel: Postoperative changes about the rectum. Individual bowel loops are difficult to assess due to extensive tumor in the abdomen mildly dilated and filled with stool like material small-bowel loops are pushed into the RIGHT hemiabdomen. The appendix is normal. Vascular/Lymphatic: Tortuous abdominal aorta without aneurysmal dilation with calcified atheromatous plaques similar to the previous study. No retroperitoneal lymphadenopathy. No  pelvic lymphadenopathy. Reproductive: Post hysterectomy. Signs of pelvic floor dysfunction as described. Other: No free air. LEFT retroperitoneal tumor implant is similar to the prior study.  Musculoskeletal: No acute bone finding. Spinal degenerative changes and scoliotic curvature of the spine with similar appearance to previous imaging. IMPRESSION: 1. New hepatic masses that are suspicious for metastatic disease with some atypical features given that vessels can be seen passing through these areas and that they are seen in the setting of tumor with peritoneal predilection, less commonly associated with hepatic parenchymal disease though this could be seen in the setting of advanced ovarian cancer. In the setting of fever, hepatic abscesses should be considered. Correlate with any recent antibiotic administration as an outpatient that may explain the lack of edema. Ultimately sampling of these areas may be helpful to differentiate. 2. Decrease in size of some areas but with gas in the area of tumor in the splenorenal ligament, this raises the question of secondary infection or gas related to necrosis. 3. Enlarging dominant lesion in the anterior abdomen with displacement of numerous bowel loops. Aortic Atherosclerosis (ICD10-I70.0). Electronically Signed   By: Zetta Bills M.D.   On: 03/15/2020 20:07   DG Chest Portable 1 View  Result Date: 03/16/2020 CLINICAL DATA:  Shortness of breath EXAM: PORTABLE CHEST 1 VIEW COMPARISON:  March 15, 2020 FINDINGS: There is mild cardiomegaly. Aortic knob calcifications are seen. Prominence of the central pulmonary vasculature are again noted. No large airspace consolidation or pleural effusion. A right-sided MediPort catheter seen with the tip in the mid SVC. No acute osseous abnormality. A small hiatal hernia is present. IMPRESSION: Mild pulmonary vascular congestion. Electronically Signed   By: Prudencio Pair M.D.   On: 03/16/2020 01:23   DG Chest Port 1 View  Result  Date: 03/15/2020 CLINICAL DATA:  Fever.  Chemotherapy yesterday. EXAM: PORTABLE CHEST 1 VIEW COMPARISON:  No prior chest imaging. FINDINGS: Right chest port remains in place. Upper normal heart size with retrocardiac hiatal hernia. No focal airspace disease. No pleural effusion or pneumothorax. No evidence of pulmonary edema. Scoliotic curvature of the spine without acute osseous abnormalities. IMPRESSION: 1. No acute chest findings. 2. Hiatal hernia. Electronically Signed   By: Keith Rake M.D.   On: 03/15/2020 19:52   DG Shoulder Left  Result Date: 03/18/2020 CLINICAL DATA:  Left shoulder pain EXAM: LEFT SHOULDER - 2+ VIEW COMPARISON:  None. FINDINGS: Alignment appears anatomic. There is no acute fracture. No substantial joint space narrowing. Mild degenerative changes at the clavicular joint. Small subacromial spur. IMPRESSION: No significant osseous abnormality. Electronically Signed   By: Macy Mis M.D.   On: 03/18/2020 15:43   ECHOCARDIOGRAM COMPLETE  Result Date: 03/17/2020    ECHOCARDIOGRAM REPORT   Patient Name:   Tamara Mckinney Date of Exam: 03/17/2020 Medical Rec #:  [id number]      Height:       61.0 in Accession #:    [id number]     Weight:       95.0 lb Date of Birth:  Dec 14, 1927      BSA:          1.376 m Patient Age:    38 years       BP:           124/76 mmHg Patient Gender: F              HR:           90 bpm. Exam Location:  [facility] Procedure: 2D Echo Indications:     CHF-ACUTE DIASTOLIC 545.62/B63.89  History:         Patient has no prior history of Echocardiogram  examinations.                  Risk Factors:Hypertension. ASTHMA.  Sonographer:     Avanell Shackleton Referring Phys:  [id number] Sharen Hones Diagnosing Phys: Kirk Ruths MD IMPRESSIONS  1. Left ventricular ejection fraction, by estimation, is 60 to 65%. The left ventricle has normal function. The left ventricle has no regional wall motion abnormalities. There is mild left ventricular hypertrophy. Left ventricular diastolic  parameters are consistent with Grade I diastolic dysfunction (impaired relaxation). Elevated left atrial pressure.  2. Right ventricular systolic function is normal. The right ventricular size is normal. There is normal pulmonary artery systolic pressure.  3. Left atrial size was mildly dilated.  4. Moderate pleural effusion in the left lateral region.  5. The mitral valve is normal in structure. Trivial mitral valve regurgitation. No evidence of mitral stenosis.  6. The aortic valve was not well visualized. Aortic valve regurgitation is not visualized. No aortic stenosis is present.  7. The inferior vena cava is normal in size with greater than 50% respiratory variability, suggesting right atrial pressure of 3 mmHg. FINDINGS  Left Ventricle: Left ventricular ejection fraction, by estimation, is 60 to 65%. The left ventricle has normal function. The left ventricle has no regional wall motion abnormalities. The left ventricular internal cavity size was normal in size. There is  mild left ventricular hypertrophy. Left ventricular diastolic parameters are consistent with Grade I diastolic dysfunction (impaired relaxation). Elevated left atrial pressure. Right Ventricle: The right ventricular size is normal.Right ventricular systolic function is normal. There is normal pulmonary artery systolic pressure. The tricuspid regurgitant velocity is 2.62 m/s, and with an assumed right atrial pressure of 3 mmHg, the estimated right ventricular systolic pressure is 51.7 mmHg. Left Atrium: Left atrial size was mildly dilated. Right Atrium: Right atrial size was normal in size. Pericardium: There is no evidence of pericardial effusion. Mitral Valve: The mitral valve is normal in structure. Normal mobility of the mitral valve leaflets. Mild mitral annular calcification. Trivial mitral valve regurgitation. No evidence of mitral valve stenosis. Tricuspid Valve: The tricuspid valve is normal in structure. Tricuspid valve regurgitation  is mild . No evidence of tricuspid stenosis. Aortic Valve: The aortic valve was not well visualized. Aortic valve regurgitation is not visualized. No aortic stenosis is present. Aortic valve mean gradient measures 7.7 mmHg. Aortic valve peak gradient measures 14.0 mmHg. Aortic valve area, by VTI measures 1.30 cm. Pulmonic Valve: The pulmonic valve was not well visualized. Pulmonic valve regurgitation is not visualized. No evidence of pulmonic stenosis. Aorta: The aortic root is normal in size and structure. Venous: The inferior vena cava is normal in size with greater than 50% respiratory variability, suggesting right atrial pressure of 3 mmHg. IAS/Shunts: No atrial level shunt detected by color flow Doppler. Additional Comments: There is a moderate pleural effusion in the left lateral region.  LEFT VENTRICLE PLAX 2D LVIDd:         3.42 cm  Diastology LVIDs:         2.04 cm  LV e' lateral:   6.42 cm/s LV PW:         0.75 cm  LV E/e' lateral: 19.0 LV IVS:        1.08 cm  LV e' medial:    7.83 cm/s LVOT diam:     1.50 cm  LV E/e' medial:  15.6 LV SV:         47 LV SV Index:   34 LVOT  Area:     1.77 cm  IVC IVC diam: 1.69 cm LEFT ATRIUM             Index       RIGHT ATRIUM           Index LA diam:        3.60 cm 2.62 cm/m  RA Area:     12.00 cm LA Vol (A2C):   51.2 ml 37.20 ml/m RA Volume:   23.00 ml  16.71 ml/m LA Vol (A4C):   39.7 ml 28.84 ml/m LA Biplane Vol: 46.7 ml 33.93 ml/m  AORTIC VALVE AV Area (Vmax):    1.19 cm AV Area (Vmean):   1.28 cm AV Area (VTI):     1.30 cm AV Vmax:           187.33 cm/s AV Vmean:          130.000 cm/s AV VTI:            0.359 m AV Peak Grad:      14.0 mmHg AV Mean Grad:      7.7 mmHg LVOT Vmax:         126.50 cm/s LVOT Vmean:        94.250 cm/s LVOT VTI:          0.264 m LVOT/AV VTI ratio: 0.73  AORTA Ao Root diam: 2.70 cm MITRAL VALVE                TRICUSPID VALVE MV Area (PHT): 4.10 cm     TR Peak grad:   27.5 mmHg MV Decel Time: 185 msec     TR Vmax:        262.00 cm/s  MV E velocity: 122.00 cm/s MV A velocity: 157.00 cm/s  SHUNTS MV E/A ratio:  0.78         Systemic VTI:  0.26 m                             Systemic Diam: 1.50 cm Kirk Ruths MD Electronically signed by Kirk Ruths MD Signature Date/Time: 03/17/2020/3:04:49 PM    Final    US Abdomen Limited RUQ  Result Date: 03/16/2020 CLINICAL DATA:  Multiple liver lesions. Possible Mets versus abscesses. EXAM: ULTRASOUND ABDOMEN LIMITED RIGHT UPPER QUADRANT COMPARISON:  CT abdomen/pelvis 03/15/2020 FINDINGS: Gallbladder: No gallstones or wall thickening visualized. No sonographic Murphy sign noted by sonographer. Common bile duct: Diameter: 3 mm Liver: Multiple hypoechoic masses are present scattered throughout the liver. The masses appear hypoechoic but solid in nature. No definite internal fluid component or debris to suggest the presence of abscesses. The largest mass in the central aspect of the right hemi liver measures 7.3 x 5.2 x 6.9 cm. Two additional index lesions measured 2 x 1.4 x 2.2 cm and 1.9 x 1.5 x 1.6 cm respectively. Portal vein is patent on color Doppler imaging with normal direction of blood flow towards the liver. Other: Technically challenging examination secondary to labored breathing. IMPRESSION: Technically challenging examination secondary to labored breathing. The multifocal hepatic lesions appear hypoechoic but solid in nature more consistent with hepatic metastatic disease than multifocal hepatic abscesses. Electronically Signed   By: Jacqulynn Cadet M.D.   On: 03/16/2020 14:51    ASSESSMENT: Recurrent ovarian cancer, bacteremia.   PLAN:    1.  Ovarian cancer: Patient last received single agent gemcitabine on Thursday, March 14, 2020.  She was scheduled for her next  infusion on March 21, 2020, but this will be delayed until her acute symptoms resolved.  Her most recent CA-125 continue to improve and is 146.0. 2.  Bacteremia: Blood cultures positive for Streptococcus intermedius.   Appreciate infectious disease input.  Continue current antibiotics.  There is concerned that liver lesions may be abscess and ultrasound-guided aspiration has been scheduled. 3.  Anemia: Chronic and unchanged.  Patient's hemoglobin is 8.5.  Appreciate consult, will follow.  Lloyd Huger, MD   03/18/2020 4:02 PM

## 2020-03-19 ENCOUNTER — Encounter: Payer: Self-pay | Admitting: Internal Medicine

## 2020-03-19 ENCOUNTER — Inpatient Hospital Stay: Payer: Medicare PPO

## 2020-03-19 LAB — CULTURE, BLOOD (ROUTINE X 2)
Special Requests: ADEQUATE
Special Requests: ADEQUATE

## 2020-03-19 LAB — BASIC METABOLIC PANEL
Anion gap: 8 (ref 5–15)
BUN: 24 mg/dL — ABNORMAL HIGH (ref 8–23)
CO2: 22 mmol/L (ref 22–32)
Calcium: 8.2 mg/dL — ABNORMAL LOW (ref 8.9–10.3)
Chloride: 101 mmol/L (ref 98–111)
Creatinine, Ser: 0.72 mg/dL (ref 0.44–1.00)
GFR calc Af Amer: >60 mL/min (ref >60)
GFR calc non Af Amer: >60 mL/min (ref >60)
Glucose, Bld: 150 mg/dL — ABNORMAL HIGH (ref 70–99)
Potassium: 4.3 mmol/L (ref 3.5–5.1)
Sodium: 131 mmol/L — ABNORMAL LOW (ref 135–145)

## 2020-03-19 LAB — CBC WITH DIFFERENTIAL/PLATELET
Abs Immature Granulocytes: 0.07 10*3/uL (ref 0.00–0.07)
Basophils Absolute: 0 10*3/uL (ref 0.0–0.1)
Basophils Relative: 0 %
Eosinophils Absolute: 0 10*3/uL (ref 0.0–0.5)
Eosinophils Relative: 0 %
HCT: 28.8 % — ABNORMAL LOW (ref 36.0–46.0)
Hemoglobin: 9.6 g/dL — ABNORMAL LOW (ref 12.0–15.0)
Immature Granulocytes: 1 %
Lymphocytes Relative: 4 %
Lymphs Abs: 0.3 10*3/uL — ABNORMAL LOW (ref 0.7–4.0)
MCH: 29 pg (ref 26.0–34.0)
MCHC: 33.3 g/dL (ref 30.0–36.0)
MCV: 87 fL (ref 80.0–100.0)
Monocytes Absolute: 0.2 10*3/uL (ref 0.1–1.0)
Monocytes Relative: 2 %
Neutro Abs: 7.6 10*3/uL (ref 1.7–7.7)
Neutrophils Relative %: 93 %
Platelets: 176 10*3/uL (ref 150–400)
RBC: 3.31 MIL/uL — ABNORMAL LOW (ref 3.87–5.11)
RDW: 19.9 % — ABNORMAL HIGH (ref 11.5–15.5)
WBC: 8.1 10*3/uL (ref 4.0–10.5)
nRBC: 0 % (ref 0.0–0.2)

## 2020-03-19 LAB — APTT: aPTT: 47 seconds — ABNORMAL HIGH (ref 24–36)

## 2020-03-19 LAB — PROTIME-INR
INR: 1.2 (ref 0.8–1.2)
Prothrombin Time: 14.8 seconds (ref 11.4–15.2)

## 2020-03-19 LAB — MAGNESIUM: Magnesium: 1.8 mg/dL (ref 1.7–2.4)

## 2020-03-19 LAB — PROCALCITONIN: Procalcitonin: 6.94 ng/mL

## 2020-03-19 LAB — GLUCOSE, CAPILLARY
Glucose-Capillary: 105 mg/dL — ABNORMAL HIGH (ref 70–99)
Glucose-Capillary: 105 mg/dL — ABNORMAL HIGH (ref 70–99)

## 2020-03-19 MED ORDER — MIDAZOLAM HCL 5 MG/5ML IJ SOLN
INTRAMUSCULAR | Status: AC
  Filled 2020-03-19: qty 5

## 2020-03-19 MED ORDER — ENSURE ENLIVE PO LIQD
237.0000 mL | Freq: Two times a day (BID) | ORAL | Status: DC
  Administered 2020-03-20 – 2020-03-22 (×4): 237 mL via ORAL

## 2020-03-19 MED ORDER — MIDAZOLAM HCL 5 MG/5ML IJ SOLN
INTRAMUSCULAR | Status: AC | PRN
  Administered 2020-03-19: 0.5 mg via INTRAVENOUS

## 2020-03-19 MED ORDER — FENTANYL CITRATE (PF) 100 MCG/2ML IJ SOLN
INTRAMUSCULAR | Status: AC
  Administered 2020-03-19: 25 ug via INTRAVENOUS
  Filled 2020-03-19: qty 2

## 2020-03-19 MED ORDER — FENTANYL CITRATE (PF) 100 MCG/2ML IJ SOLN
INTRAMUSCULAR | Status: AC | PRN
  Administered 2020-03-19: 25 ug via INTRAVENOUS

## 2020-03-19 MED ORDER — POLYSACCHARIDE IRON COMPLEX 150 MG PO CAPS
150.0000 mg | ORAL_CAPSULE | Freq: Every day | ORAL | Status: DC
  Administered 2020-03-19 – 2020-03-22 (×4): 150 mg via ORAL
  Filled 2020-03-19 (×4): qty 1

## 2020-03-19 MED ORDER — FENTANYL CITRATE (PF) 100 MCG/2ML IJ SOLN: 25.0000 ug | Freq: Once | INTRAMUSCULAR | Status: AC

## 2020-03-19 NOTE — Progress Notes (Signed)
Chief Complaint: Patient was seen in consultation today for liver lesion aspiration/biopsy  Referring Physician(s): Dr. Grayland Ormond Dr. Sharen Hones  Supervising Physician: Daryll Brod  Patient Status: [facility] - In-pt  History of Present Illness: Tamara Mckinney is a 86 y.o. female admitted with fever. Hx of ovarian cancer. Positive blood cultures. Imaging finds new hepatic masses concerning for metastatic spread but in the setting of fever and bacteremia also concerning for abscess. IR is asked to perform image guided aspiration/biopsy. PMHx, meds, labs, imaging, allergies reviewed. Feels better since admission. Has been NPO today as directed. Family at bedside.    Past Medical History:  Diagnosis Date  . Anemia   . Arthritis   . Asthma   . Dyspnea   . GERD (gastroesophageal reflux disease)   . Hypertension   . Ovarian cancer ([place]) 2018    Past Surgical History:  Procedure Laterality Date  . ABDOMINAL HYSTERECTOMY    . BREAST BIOPSY     x6  . BUNIONECTOMY Bilateral   . CATARACT EXTRACTION, BILATERAL    . FLEXIBLE SIGMOIDOSCOPY N/A 05/21/2017   Procedure: FLEXIBLE SIGMOIDOSCOPY;  Surgeon: Lin Landsman, MD;  Location[facility] ENDOSCOPY;  Service: Gastroenterology;  Laterality: N/A;  . INCONTINENCE SURGERY    . PORTA CATH INSERTION N/A 06/16/2017   Procedure: PORTA CATH INSERTION;  Surgeon: Algernon Huxley, MD;  Location: [facility] LAB;  Service: Cardiovascular;  Laterality: N/A;  . RECTAL SURGERY  04/2018  . REPAIR OF RECTAL PROLAPSE N/A 05/11/2017   Procedure: REPAIR OF RECTAL PROLAPSE;  Surgeon: Leonie Green, MD;  Location: [facility] ORS;  Service: General;  Laterality: N/A;  . TONSILLECTOMY    . VAGINA SURGERY     uncertain procedure performed    Allergies: Patient has no known allergies.  Medications:  Current Facility-Administered Medications:  .  0.9 %  sodium chloride infusion, , Intravenous, Once, Garba, Mohammad L, MD .  0.9 %  sodium  chloride infusion, , Intravenous, PRN, Sharen Hones, MD, Stopped at 03/17/20 1749 .  acetaminophen (TYLENOL) tablet 650 mg, 650 mg, Oral, Q6H PRN, 650 mg at 03/18/20 2200 **OR** acetaminophen (TYLENOL) suppository 650 mg, 650 mg, Rectal, Q6H PRN, Jonelle Sidle, Mohammad L, MD .  cefTRIAXone (ROCEPHIN) 2 g in sodium chloride 0.9 % 100 mL IVPB, 2 g, Intravenous, Q24H, Sharion Settler, NP, Last Rate: 200 mL/hr at 03/19/20 0450, 2 g at 03/19/20 0450 .  Chlorhexidine Gluconate Cloth 2 % PADS 6 each, 6 each, Topical, Daily, Sharen Hones, MD, 6 each at 03/18/20 1136 .  cyclobenzaprine (FLEXERIL) tablet 10 mg, 10 mg, Oral, TID PRN, Jonelle Sidle, Mohammad L, MD .  enoxaparin (LOVENOX) injection 30 mg, 30 mg, Subcutaneous, Q24H, Hallaji, Sheema M, RPH, 30 mg at 03/18/20 2200 .  [START ON 03/20/2020] feeding supplement (ENSURE ENLIVE) (ENSURE ENLIVE) liquid 237 mL, 237 mL, Oral, BID BM, Roosevelt Locks, Dekui, MD .  fentaNYL (SUBLIMAZE) 100 MCG/2ML injection, , , ,  .  ipratropium-albuterol (DUONEB) 0.5-2.5 (3) MG/3ML nebulizer solution 3 mL, 3 mL, Nebulization, Q2H PRN, Sharion Settler, NP, 3 mL at 03/16/20 0045 .  lidocaine (LIDODERM) 5 % 1 patch, 1 patch, Transdermal, Q24H, Sharen Hones, MD, 1 patch at 03/18/20 1712 .  metroNIDAZOLE (FLAGYL) IVPB 500 mg, 500 mg, Intravenous, Q8H, Garba, Mohammad L, MD, Last Rate: 100 mL/hr at 03/18/20 2215, 500 mg at 03/18/20 2215 .  midazolam (VERSED) 5 MG/5ML injection, , , ,  .  montelukast (SINGULAIR) tablet 10 mg, 10 mg, Oral, QHS PRN, Jonelle Sidle,  Mohammad L, MD .  ondansetron (ZOFRAN) tablet 4 mg, 4 mg, Oral, Q6H PRN **OR** ondansetron (ZOFRAN) injection 4 mg, 4 mg, Intravenous, Q6H PRN, Garba, Mohammad L, MD .  pantoprazole (PROTONIX) EC tablet 40 mg, 40 mg, Oral, Daily, Jonelle Sidle, Mohammad L, MD, 40 mg at 03/18/20 1015 .  polyethylene glycol (MIRALAX / GLYCOLAX) packet 17 g, 17 g, Oral, Daily PRN, Jonelle Sidle, Mohammad L, MD .  predniSONE (DELTASONE) tablet 40 mg, 40 mg, Oral, Q breakfast, Sharen Hones,  MD, 40 mg at 03/18/20 1712 .  sodium chloride flush (NS) 0.9 % injection 10 mL, 10 mL, Intravenous, Q12H, Sharen Hones, MD, 10 mL at 03/18/20 2200  Facility-Administered Medications Ordered in Other Encounters:  .  sodium chloride flush (NS) 0.9 % injection 10 mL, 10 mL, Intravenous, PRN, Lloyd Huger, MD, 10 mL at 03/10/18 1200    Family History  Problem Relation Age of Onset  . Heart disease Mother   . Heart disease Father   . Heart disease Sister   . Heart attack Sister   . Ulcerative colitis Brother   . Lung cancer Brother   . Thyroid cancer Sister   . Asthma Sister   . Diabetes Sister   . Asthma Sister   . Pancreatic cancer Sister   . Dementia Sister   . Asthma Brother   . Heart disease Brother   . Asthma Brother   . Lung cancer Brother   . Asthma Brother   . Lung cancer Brother   . Lung cancer Brother   . Rheum arthritis Brother     Social History   Socioeconomic History  . Marital status: Widowed    Spouse name: Not on file  . Number of children: Not on file  . Years of education: Not on file  . Highest education level: Not on file  Occupational History  . Not on file  Tobacco Use  . Smoking status: Never Smoker  . Smokeless tobacco: Never Used  Vaping Use  . Vaping Use: Never used  Substance and Sexual Activity  . Alcohol use: No  . Drug use: No  . Sexual activity: Never  Other Topics Concern  . Not on file  Social History Narrative  . Not on file   Social Determinants of Health   Financial Resource Strain:   . Difficulty of Paying Living Expenses:   Food Insecurity:   . Worried About Charity fundraiser in the Last Year:   . Arboriculturist in the Last Year:   Transportation Needs:   . Film/video editor (Medical):   Marland Kitchen Lack of Transportation (Non-Medical):   Physical Activity:   . Days of Exercise per Week:   . Minutes of Exercise per Session:   Stress:   . Feeling of Stress :   Social Connections:   . Frequency of  Communication with Friends and Family:   . Frequency of Social Gatherings with Friends and Family:   . Attends Religious Services:   . Active Member of Clubs or Organizations:   . Attends Archivist Meetings:   Marland Kitchen Marital Status:     Review of Systems: A 12 point ROS discussed and pertinent positives are indicated in the HPI above.  All other systems are negative.  Review of Systems  Vital Signs: BP (!) 155/90 (BP Location: Left Arm)   Pulse 68   Temp 98.2 F (36.8 C)   Resp 16   Ht 5\' 1"  (1.549 m)  Wt (!) 43.1 kg   SpO2 99%   BMI 17.95 kg/m   Physical Exam Constitutional:      Appearance: Normal appearance.  HENT:     Mouth/Throat:     Mouth: Mucous membranes are moist.     Pharynx: Oropharynx is clear.  Cardiovascular:     Rate and Rhythm: Normal rate and regular rhythm.     Heart sounds: Normal heart sounds.  Pulmonary:     Effort: Pulmonary effort is normal. No respiratory distress.     Breath sounds: Normal breath sounds.  Abdominal:     General: There is no distension.     Palpations: Abdomen is soft. There is no mass.     Tenderness: There is no abdominal tenderness.  Neurological:     General: No focal deficit present.     Mental Status: She is alert and oriented to person, place, and time.  Psychiatric:        Mood and Affect: Mood normal.        Thought Content: Thought content normal.        Judgment: Judgment normal.      Imaging: CT ABDOMEN PELVIS W CONTRAST  Result Date: 03/15/2020 CLINICAL DATA:  Abdominal abscess or infection suspected. EXAM: CT ABDOMEN AND PELVIS WITH CONTRAST TECHNIQUE: Multidetector CT imaging of the abdomen and pelvis was performed using the standard protocol following bolus administration of intravenous contrast. CONTRAST:  70mL OMNIPAQUE IOHEXOL 300 MG/ML  SOLN COMPARISON:  November 14, 2019 FINDINGS: Lower chest: Hiatal hernia similar to prior study. No consolidation. No pleural effusion. Hepatobiliary: New hepatic  masses. (Image 19, series 2) 5.2 x 4.7 cm. This is in the RIGHT hemi liver. And RIGHT hepatic vein and portal veins can be seen passing through this area. Another lesion measuring 2.4 cm in the anterior RIGHT hemi liver (image 15, series 2) 2 additional foci in the central RIGHT liver and 2 more near the dome of the RIGHT hemi liver of similar size. These are low-density and septate. Surrounding edema in the liver is not appreciated. No significant biliary duct dilation or pericholecystic stranding. Pancreas: Pancreas is largely encased in the distal splenorenal ligament by tumor implant that is now more confluent than on the previous study. This measures approximately 7.7 x 5.0 cm as compared to 9.3 x 5.5 cm. This shows some small locules of gas within the lateral portion of the tumor on image 16 of series 2, this is of uncertain significance adjacent to the splenic flexure. Spleen: Splenic hilum infiltrated by tumor with similar appearance as described. Adrenals/Urinary Tract: RIGHT adrenal lesion unchanged. Symmetric renal enhancement. No hydronephrosis. Urinary is distended and extends below the pelvic floor. Stomach/Bowel: Postoperative changes about the rectum. Individual bowel loops are difficult to assess due to extensive tumor in the abdomen mildly dilated and filled with stool like material small-bowel loops are pushed into the RIGHT hemiabdomen. The appendix is normal. Vascular/Lymphatic: Tortuous abdominal aorta without aneurysmal dilation with calcified atheromatous plaques similar to the previous study. No retroperitoneal lymphadenopathy. No pelvic lymphadenopathy. Reproductive: Post hysterectomy. Signs of pelvic floor dysfunction as described. Other: No free air. LEFT retroperitoneal tumor implant is similar to the prior study. Musculoskeletal: No acute bone finding. Spinal degenerative changes and scoliotic curvature of the spine with similar appearance to previous imaging. IMPRESSION: 1. New hepatic  masses that are suspicious for metastatic disease with some atypical features given that vessels can be seen passing through these areas and that they are seen in the  setting of tumor with peritoneal predilection, less commonly associated with hepatic parenchymal disease though this could be seen in the setting of advanced ovarian cancer. In the setting of fever, hepatic abscesses should be considered. Correlate with any recent antibiotic administration as an outpatient that may explain the lack of edema. Ultimately sampling of these areas may be helpful to differentiate. 2. Decrease in size of some areas but with gas in the area of tumor in the splenorenal ligament, this raises the question of secondary infection or gas related to necrosis. 3. Enlarging dominant lesion in the anterior abdomen with displacement of numerous bowel loops. Aortic Atherosclerosis (ICD10-I70.0). Electronically Signed   By: Zetta Bills M.D.   On: 03/15/2020 20:07   DG Chest Portable 1 View  Result Date: 03/16/2020 CLINICAL DATA:  Shortness of breath EXAM: PORTABLE CHEST 1 VIEW COMPARISON:  March 15, 2020 FINDINGS: There is mild cardiomegaly. Aortic knob calcifications are seen. Prominence of the central pulmonary vasculature are again noted. No large airspace consolidation or pleural effusion. A right-sided MediPort catheter seen with the tip in the mid SVC. No acute osseous abnormality. A small hiatal hernia is present. IMPRESSION: Mild pulmonary vascular congestion. Electronically Signed   By: Prudencio Pair M.D.   On: 03/16/2020 01:23   DG Chest Port 1 View  Result Date: 03/15/2020 CLINICAL DATA:  Fever.  Chemotherapy yesterday. EXAM: PORTABLE CHEST 1 VIEW COMPARISON:  No prior chest imaging. FINDINGS: Right chest port remains in place. Upper normal heart size with retrocardiac hiatal hernia. No focal airspace disease. No pleural effusion or pneumothorax. No evidence of pulmonary edema. Scoliotic curvature of the spine without  acute osseous abnormalities. IMPRESSION: 1. No acute chest findings. 2. Hiatal hernia. Electronically Signed   By: Keith Rake M.D.   On: 03/15/2020 19:52   DG Shoulder Left  Result Date: 03/18/2020 CLINICAL DATA:  Left shoulder pain EXAM: LEFT SHOULDER - 2+ VIEW COMPARISON:  None. FINDINGS: Alignment appears anatomic. There is no acute fracture. No substantial joint space narrowing. Mild degenerative changes at the clavicular joint. Small subacromial spur. IMPRESSION: No significant osseous abnormality. Electronically Signed   By: Macy Mis M.D.   On: 03/18/2020 15:43   ECHOCARDIOGRAM COMPLETE  Result Date: 03/17/2020    ECHOCARDIOGRAM REPORT   Patient Name:   HETVI SHAWHAN Date of Exam: 03/17/2020 Medical Rec #:  [id number]      Height:       61.0 in Accession #:    [id number]     Weight:       95.0 lb Date of Birth:  [birth year]      BSA:          1.376 m Patient Age:    12 years       BP:           124/76 mmHg Patient Gender: F              HR:           90 bpm. Exam Location:  [facility] Procedure: 2D Echo Indications:     CHF-ACUTE DIASTOLIC 213.08/M57.84  History:         Patient has no prior history of Echocardiogram examinations.                  Risk Factors:Hypertension. ASTHMA.  Sonographer:     Avanell Shackleton Referring Phys:  [id number] Sharen Hones Diagnosing Phys: Kirk Ruths MD IMPRESSIONS  1. Left ventricular ejection fraction, by estimation, is 60 to  65%. The left ventricle has normal function. The left ventricle has no regional wall motion abnormalities. There is mild left ventricular hypertrophy. Left ventricular diastolic parameters are consistent with Grade I diastolic dysfunction (impaired relaxation). Elevated left atrial pressure.  2. Right ventricular systolic function is normal. The right ventricular size is normal. There is normal pulmonary artery systolic pressure.  3. Left atrial size was mildly dilated.  4. Moderate pleural effusion in the left lateral region.  5. The mitral  valve is normal in structure. Trivial mitral valve regurgitation. No evidence of mitral stenosis.  6. The aortic valve was not well visualized. Aortic valve regurgitation is not visualized. No aortic stenosis is present.  7. The inferior vena cava is normal in size with greater than 50% respiratory variability, suggesting right atrial pressure of 3 mmHg. FINDINGS  Left Ventricle: Left ventricular ejection fraction, by estimation, is 60 to 65%. The left ventricle has normal function. The left ventricle has no regional wall motion abnormalities. The left ventricular internal cavity size was normal in size. There is  mild left ventricular hypertrophy. Left ventricular diastolic parameters are consistent with Grade I diastolic dysfunction (impaired relaxation). Elevated left atrial pressure. Right Ventricle: The right ventricular size is normal.Right ventricular systolic function is normal. There is normal pulmonary artery systolic pressure. The tricuspid regurgitant velocity is 2.62 m/s, and with an assumed right atrial pressure of 3 mmHg, the estimated right ventricular systolic pressure is 78.2 mmHg. Left Atrium: Left atrial size was mildly dilated. Right Atrium: Right atrial size was normal in size. Pericardium: There is no evidence of pericardial effusion. Mitral Valve: The mitral valve is normal in structure. Normal mobility of the mitral valve leaflets. Mild mitral annular calcification. Trivial mitral valve regurgitation. No evidence of mitral valve stenosis. Tricuspid Valve: The tricuspid valve is normal in structure. Tricuspid valve regurgitation is mild . No evidence of tricuspid stenosis. Aortic Valve: The aortic valve was not well visualized. Aortic valve regurgitation is not visualized. No aortic stenosis is present. Aortic valve mean gradient measures 7.7 mmHg. Aortic valve peak gradient measures 14.0 mmHg. Aortic valve area, by VTI measures 1.30 cm. Pulmonic Valve: The pulmonic valve was not well  visualized. Pulmonic valve regurgitation is not visualized. No evidence of pulmonic stenosis. Aorta: The aortic root is normal in size and structure. Venous: The inferior vena cava is normal in size with greater than 50% respiratory variability, suggesting right atrial pressure of 3 mmHg. IAS/Shunts: No atrial level shunt detected by color flow Doppler. Additional Comments: There is a moderate pleural effusion in the left lateral region.  LEFT VENTRICLE PLAX 2D LVIDd:         3.42 cm  Diastology LVIDs:         2.04 cm  LV e' lateral:   6.42 cm/s LV PW:         0.75 cm  LV E/e' lateral: 19.0 LV IVS:        1.08 cm  LV e' medial:    7.83 cm/s LVOT diam:     1.50 cm  LV E/e' medial:  15.6 LV SV:         47 LV SV Index:   34 LVOT Area:     1.77 cm  IVC IVC diam: 1.69 cm LEFT ATRIUM             Index       RIGHT ATRIUM           Index LA diam:  3.60 cm 2.62 cm/m  RA Area:     12.00 cm LA Vol (A2C):   51.2 ml 37.20 ml/m RA Volume:   23.00 ml  16.71 ml/m LA Vol (A4C):   39.7 ml 28.84 ml/m LA Biplane Vol: 46.7 ml 33.93 ml/m  AORTIC VALVE AV Area (Vmax):    1.19 cm AV Area (Vmean):   1.28 cm AV Area (VTI):     1.30 cm AV Vmax:           187.33 cm/s AV Vmean:          130.000 cm/s AV VTI:            0.359 m AV Peak Grad:      14.0 mmHg AV Mean Grad:      7.7 mmHg LVOT Vmax:         126.50 cm/s LVOT Vmean:        94.250 cm/s LVOT VTI:          0.264 m LVOT/AV VTI ratio: 0.73  AORTA Ao Root diam: 2.70 cm MITRAL VALVE                TRICUSPID VALVE MV Area (PHT): 4.10 cm     TR Peak grad:   27.5 mmHg MV Decel Time: 185 msec     TR Vmax:        262.00 cm/s MV E velocity: 122.00 cm/s MV A velocity: 157.00 cm/s  SHUNTS MV E/A ratio:  0.78         Systemic VTI:  0.26 m                             Systemic Diam: 1.50 cm Kirk Ruths MD Electronically signed by Kirk Ruths MD Signature Date/Time: 03/17/2020/3:04:49 PM    Final    US Abdomen Limited RUQ  Result Date: 03/16/2020 CLINICAL DATA:  Multiple liver  lesions. Possible Mets versus abscesses. EXAM: ULTRASOUND ABDOMEN LIMITED RIGHT UPPER QUADRANT COMPARISON:  CT abdomen/pelvis 03/15/2020 FINDINGS: Gallbladder: No gallstones or wall thickening visualized. No sonographic Murphy sign noted by sonographer. Common bile duct: Diameter: 3 mm Liver: Multiple hypoechoic masses are present scattered throughout the liver. The masses appear hypoechoic but solid in nature. No definite internal fluid component or debris to suggest the presence of abscesses. The largest mass in the central aspect of the right hemi liver measures 7.3 x 5.2 x 6.9 cm. Two additional index lesions measured 2 x 1.4 x 2.2 cm and 1.9 x 1.5 x 1.6 cm respectively. Portal vein is patent on color Doppler imaging with normal direction of blood flow towards the liver. Other: Technically challenging examination secondary to labored breathing. IMPRESSION: Technically challenging examination secondary to labored breathing. The multifocal hepatic lesions appear hypoechoic but solid in nature more consistent with hepatic metastatic disease than multifocal hepatic abscesses. Electronically Signed   By: Jacqulynn Cadet M.D.   On: 03/16/2020 14:51    Labs:  CBC: Recent Labs    03/15/20 1748 03/16/20 0502 03/17/20 0602 03/19/20 0409  WBC 18.8* 15.6* 10.3 8.1  HGB 7.4* 8.5* 8.5* 9.6*  HCT 21.5* 26.0* 24.9* 28.8*  PLT 306 262 220 176    COAGS: Recent Labs    03/15/20 1748 03/16/20 0502 03/19/20 0409  INR 1.3* 1.4* 1.2  APTT  --   --  47*    BMP: Recent Labs    03/16/20 0502 03/17/20 0602 03/18/20 0642 03/19/20 0409  NA 128*  129* 131* 131*  K 2.9* 3.5 3.3* 4.3  CL 99 99 101 101  CO2 20* 20* 24 22  GLUCOSE 112* 88 98 150*  BUN 28* 29* 25* 24*  CALCIUM 7.8* 7.8* 7.9* 8.2*  CREATININE 0.87 1.02* 1.05* 0.72  GFRNONAA 59* 48* 47* >60  GFRAA >60 56* 54* >60    LIVER FUNCTION TESTS: Recent Labs    02/22/20 1012 03/14/20 1005 03/15/20 1748 03/16/20 0502  BILITOT 0.4 0.7 0.7  1.2  AST 25 28 149* 152*  ALT 13 14 74* 88*  ALKPHOS 75 77 86 66  PROT 6.6 6.6 5.6* 5.2*  ALBUMIN 3.5 3.6 2.8* 2.6*    TUMOR MARKERS: No results for input(s): AFPTM, CEA, CA199, CHROMGRNA in the last 8760 hours.  Assessment and Plan: Liver masses, possible abscess vs mets. Hx of ovarian cancer but admitted with fevers, bacteremia For US guided aspiration/biopsy of liver lesion Risks and benefits of liver lesion bx/aspiration was discussed with the patient and/or patient's family including, but not limited to bleeding, infection, damage to adjacent structures or low yield requiring additional tests.  All of the questions were answered and there is agreement to proceed.  Consent signed and in chart.    Thank you for this interesting consult.  I greatly enjoyed meeting Tamara Mckinney and look forward to participating in their care.  A copy of this report was sent to the requesting provider on this date.  Electronically Signed: Ascencion Dike, PA-C 03/19/2020, 10:48 AM   I spent a total of 20 minutes in face to face in clinical consultation, greater than 50% of which was counseling/coordinating care for liver lesion bx/asp

## 2020-03-19 NOTE — Progress Notes (Signed)
Pt back form [place] - band aide present to ruq - clean dry and intact - surrounding skin soft with no s/s of abn bleeding.

## 2020-03-19 NOTE — Evaluation (Signed)
Physical Therapy Evaluation Patient Details Name: Tamara Mckinney MRN: [id number] DOB: [birth year] Today's Date: 03/19/2020   History of Present Illness  Pt is a 86 yo female that presented to ED for fever, weakness, and fatigue PMH includes ovarian cancer, asthma, GERD, HTN. Also receives single agent gecitabine on 03/14/2020.  Imaging positive for new hepatic masses, guided mass aspiration/biopsy performed 8/3.    Clinical Impression  Patient alert, in bed, family at bedside. The patient reported at baseline she is modI/I for ADLs, uses rollator for ambulation, pt cooks, has someone to assist with cleaning and family is available 24/7 if needed (lives across the street).   The patient demonstrated bed mobility with minA for weight shifting. Able to sit EOB with fair balance for several minutes, pt endorsed dizziness, BP assessed 138/89 spO2 97%. HR ranged from 110s-120s throughout mobility. Sit <> Stand with rollator several times, able to perform with CGA/supervision. The patient ambulated ~37ft and then had a seated rest break, and then an additional 49ft. Pt educated on activity pacing, and seated rest breaks as needed to promote safety at home. Pt with OT at bedside at end of session.  Overall the patient demonstrated deficits (see "PT Problem List") that impede the patient's functional abilities, safety, and mobility and would benefit from skilled PT intervention. Recommendation is HHPT with supervision/assistance 24/7, family verbalized agreement and understanding.         Follow Up Recommendations Home health PT;Supervision/Assistance - 24 hour    Equipment Recommendations  None recommended by PT    Recommendations for Other Services OT consult     Precautions / Restrictions Precautions Precautions: Fall Restrictions Weight Bearing Restrictions: No      Mobility  Bed Mobility Overal bed mobility: Needs Assistance Bed Mobility: Supine to Sit     Supine to sit: Min assist      General bed mobility comments: very light minA  Transfers Overall transfer level: Needs assistance Equipment used: 4-wheeled walker Transfers: Sit to/from Stand Sit to Stand: Supervision         General transfer comment: cues for hand placement  Ambulation/Gait Ambulation/Gait assistance: Min guard Gait Distance (Feet):  (69ft, and then additional 57ft) Assistive device: 4-wheeled walker       General Gait Details: decreased gait velocity, HR in 110s-120s, seated rest break ~53ft with PT insistence for education on activity pacing.  Stairs            Wheelchair Mobility    Modified Rankin (Stroke Patients Only)       Balance Overall balance assessment: Needs assistance Sitting-balance support: Feet supported Sitting balance-Leahy Scale: Fair       Standing balance-Leahy Scale: Poor Standing balance comment: reliant on UE support                             Pertinent Vitals/Pain Pain Assessment: Faces Faces Pain Scale: Hurts even more Pain Location: R flank/shoulder with mobility Pain Descriptors / Indicators: Aching;Moaning;Grimacing;Guarding Pain Intervention(s): Limited activity within patient's tolerance;Monitored during session;Repositioned    Home Living Family/patient expects to be discharged to:: Private residence Living Arrangements: Alone;Children (sons live right next door, very close) Available Help at Discharge: Family;Available 24 hours/day Type of Home: House Home Access: Stairs to enter Entrance Stairs-Rails: None Entrance Stairs-Number of Steps: 1 Home Layout: One level Home Equipment: Cane - single point;Walker - 4 wheels;Shower seat;Grab bars - toilet;Grab bars - tub/shower      Prior Function  Level of Independence: Independent with assistive device(s)         Comments: rollator for ambulation. Has a house cleaner. Pt reported she still does her own cooking. son checks on her 2-3 times a day.     Hand  Dominance        Extremity/Trunk Assessment   Upper Extremity Assessment Upper Extremity Assessment: Generalized weakness    Lower Extremity Assessment Lower Extremity Assessment: Generalized weakness    Cervical / Trunk Assessment Cervical / Trunk Assessment: Kyphotic  Communication   Communication: No difficulties  Cognition Arousal/Alertness: Awake/alert Behavior During Therapy: WFL for tasks assessed/performed Overall Cognitive Status: Within Functional Limits for tasks assessed                                        General Comments      Exercises Other Exercises Other Exercises: Educated on activity pacing, especially with ambulation, and benefit of family support/supervision upon discharge initially. Family verbalized understanding   Assessment/Plan    PT Assessment Patient needs continued PT services  PT Problem List Decreased strength;Decreased range of motion;Decreased balance;Decreased activity tolerance;Decreased mobility;Pain       PT Treatment Interventions DME instruction;Balance training;Gait training;Neuromuscular re-education;Stair training;Functional mobility training;Therapeutic activities;Patient/family education;Therapeutic exercise    PT Goals (Current goals can be found in the Care Plan section)  Acute Rehab PT Goals Patient Stated Goal: to go home PT Goal Formulation: With patient Time For Goal Achievement: 04/02/20 Potential to Achieve Goals: Good    Frequency Min 2X/week   Barriers to discharge        Co-evaluation               AM-PAC PT "6 Clicks" Mobility  Outcome Measure Help needed turning from your back to your side while in a flat bed without using bedrails?: A Little Help needed moving from lying on your back to sitting on the side of a flat bed without using bedrails?: A Little Help needed moving to and from a bed to a chair (including a wheelchair)?: A Little Help needed standing up from a chair  using your arms (e.g., wheelchair or bedside chair)?: A Little Help needed to walk in hospital room?: A Little Help needed climbing 3-5 steps with a railing? : A Lot 6 Click Score: 17    End of Session Equipment Utilized During Treatment: Gait belt Activity Tolerance: Patient tolerated treatment well Patient left: in chair;Other (comment);with chair alarm set (with OT at bedside) Nurse Communication: Mobility status PT Visit Diagnosis: Muscle weakness (generalized) (M62.81);Other abnormalities of gait and mobility (R26.89);Difficulty in walking, not elsewhere classified (R26.2)    Time: 1937-9024 PT Time Calculation (min) (ACUTE ONLY): 35 min   Charges:   PT Evaluation $PT Eval Moderate Complexity: 1 Mod PT Treatments $Therapeutic Exercise: 23-37 mins       Lieutenant Diego PT, DPT 4:26 PM,03/19/20

## 2020-03-19 NOTE — Progress Notes (Signed)
ID Pt had biopsy/aspirate of the liver lesion and it was sent for pathology and culture. Continue ceftriaxone Follow culture

## 2020-03-19 NOTE — Progress Notes (Signed)
PROGRESS NOTE    [name]  NKN:[id number] DOB: 1928/02/24 DOA: 03/15/2020 PCP: [name], MD   Follow-up on sepsis.   Brief Narrative:  Tamara Mckinney a 86 y.o.femalewith medical history significant ofovarian cancer on chemotherapy, GERD, hypertension, asthma, lupus anticoagulant osteoarthritis, anemia of chronic disease, who was seen yesterday with her cycle 6 chemotherapy and has been consistently doing fine until this morning when she started having fever.Patient is a poor historian, could not provide additional information.  7/31.Due to suspicion of liver abscess, obtain liver ultrasound. Continue coverage with cefepime and Flagyl. Also on vancomycin. He developed significant short of breath and hypoxemia after IV fluids, received IV Lasix. Currently off oxygen, no significant short of breath.  8/1.Right upper quadrant ultrasound did not show any evidence of abscess. Blood culture came back with Streptococcus, pending final results. Antibiotic switched to Rocephin.  8/2.  ID consult obtained.  Scheduled liver aspiration to rule out liver abscess.  Continue   8/3.  Liver biopsy/aspiration performed today.  Culture and pathology sent out.    Assessment & Plan:   Principal Problem:   Sepsis ([place]) Active Problems:   Anemia   Malignant neoplasm of ovary (HCC)   Asthma   Esophageal reflux   Essential hypertension   Fibromyalgia   Sjogren's disease ([place])   Hyponatremia   Leucocytosis   Severe protein-calorie malnutrition [name]: less than 60% of standard weight) ([place])   Acute hypoxemic respiratory failure (HCC)   Streptococcal sepsis, unspecified ([place])   Cancer ([place])  #1.  Streptococcus septicemia. There is concern for possible liver abscess.  Status post liver biopsy/aspiration.  Will wait for the culture results.  Continue Rocephin.  Appreciate ID consult.  2.  Acute hypoxemic respiratory failure secondary to volume overload. Condition has  resolved.  3.  Iron deficient anemia. Status post IV iron, will start oral iron supplement.  4.  Hyponatremia. Stable.  5.  Ovarian cancer. Follow-up with oncology as outpatient.  6.  Bronchospasm. Continue steroids, condition improved after steroids.  7.  Left shoulder pain. X-ray did not show any fracture.  Continue Lidoderm patch.  DVT prophylaxis:Lovenox Code Status:Full Family Communication:Son at bedside. Disposition Plan:  Patient came from:Home  Anticipated d/c place:  Barriers to d/c OR conditions which need to be met to effect a safe d/c:   Consultants:  None  Procedures:None Antimicrobials: Rocephin.     Subjective: Patient doing better today.  She had a liver biopsy performed, did not have any pain in the stomach. She feels weak, will start walking again. Left shoulder pain has resolved after Lidoderm patch No fever or chills.  Objective: Vitals:   03/19/20 1145 03/19/20 1200 03/19/20 1208 03/19/20 1215  BP: (!) 168/87 (!) 152/86  (!) 158/86  Pulse: 74 73 76 76  Resp: _0 Temp:      TempSrc:      SpO2: 100% 100%  96%  Weight:      Height:        Intake/Output Summary (Last 24 hours) at 03/19/2020 1411 Last data filed at 03/19/2020 0930 Gross per 24 hour  Intake 302.43 ml  Output 1001 ml  Net -698.57 ml   Filed Weights   03/15/20 1733  Weight: (!) 43.1 kg    Examination:  General exam: Appears calm and comfortable  Respiratory system: Clear to auscultation. Respiratory effort normal. Cardiovascular system: S1 & S2 heard, RRR. No JVD, murmurs, rubs, gallops or clicks. No pedal edema. Gastrointestinal system: Abdomen  is nondistended, soft and nontender. No organomegaly or masses felt. Normal bowel sounds heard. Central nervous system: Alert and oriented. No focal neurological deficits. Extremities:  Symmetric 5 x 5 power. Skin: No rashes, lesions or ulcers Psychiatry: Judgement and insight appear normal. Mood & affect appropriate.     Data Reviewed: I have personally reviewed following labs and imaging studies  CBC: Recent Labs  Lab 03/14/20 1005 03/15/20 1748 03/16/20 0502 03/17/20 0602 03/19/20 0409  WBC 18.5* 18.8* 15.6* 10.3 8.1  NEUTROABS 16.4* 17.9*  --  8.8* 7.6  HGB 8.9* 7.4* 8.5* 8.5* 9.6*  HCT 26.7* 21.5* 26.0* 24.9* 28.8*  MCV 93.7 90.7 88.7 84.7 87.0  PLT 518* 306 262 220 478   Basic Metabolic Panel: Recent Labs  Lab 03/15/20 1748 03/16/20 0502 03/17/20 0602 03/18/20 0642 03/19/20 0409  NA 129* 128* 129* 131* 131*  K 3.8 2.9* 3.5 3.3* 4.3  CL 101 99 99 101 101  CO2 19* 20* 20* 24 22  GLUCOSE 120* 112* 88 98 150*  BUN 37* 28* 29* 25* 24*  CREATININE 1.02* 0.87 1.02* 1.05* 0.72  CALCIUM 7.7* 7.8* 7.8* 7.9* 8.2*  MG  --   --  1.7 1.7 1.8  PHOS  --   --   --  2.7  --    GFR: Estimated Creatinine Clearance: 31.8 mL/min (by C-G formula based on SCr of 0.72 mg/dL). Liver Function Tests: Recent Labs  Lab 03/14/20 1005 03/15/20 1748 03/16/20 0502  AST 28 149* 152*  ALT 14 74* 88*  ALKPHOS 77 86 66  BILITOT 0.7 0.7 1.2  PROT 6.6 5.6* 5.2*  ALBUMIN 3.6 2.8* 2.6*   No results for input(s): LIPASE, AMYLASE in the last 168 hours. No results for input(s): AMMONIA in the last 168 hours. Coagulation Profile: Recent Labs  Lab 03/15/20 1748 03/16/20 0502 03/19/20 0409  INR 1.3* 1.4* 1.2   Cardiac Enzymes: No results for input(s): CKTOTAL, CKMB, CKMBINDEX, TROPONINI in the last 168 hours. BNP (last 3 results) No results for input(s): PROBNP in the last 8760 hours. HbA1C: No results for input(s): HGBA1C in the last 72 hours. CBG: Recent Labs  Lab 03/16/20 0324 03/18/20 0734 03/19/20 0742  GLUCAP 107* 70 105*  105*   Lipid Profile: No results for input(s): CHOL, HDL, LDLCALC, TRIG, CHOLHDL, LDLDIRECT in the last 72 hours. Thyroid Function  Tests: No results for input(s): TSH, T4TOTAL, FREET4, T3FREE, THYROIDAB in the last 72 hours. Anemia Panel: No results for input(s): VITAMINB12, FOLATE, FERRITIN, TIBC, IRON, RETICCTPCT in the last 72 hours. Sepsis Labs: Recent Labs  Lab 03/15/20 1748 03/15/20 2042 03/16/20 0502 03/18/20 0642 03/19/20 0409  PROCALCITON  --   --  28.40 13.72 6.94  LATICACIDVEN 1.6 1.2  --   --   --     Recent Results (from the past 240 hour(s))  Culture, blood (Routine x 2)     Status: Abnormal   Collection Time: 03/15/20  5:48 PM   Specimen: BLOOD  Result Value Ref Range Status   Specimen Description   Final    BLOOD LARM Performed at [facility], [address] [place] [address] [place], [place] [address]    Special Requests   Final    BOTTLES DRAWN AEROBIC AND ANAEROBIC Blood Culture adequate volume Performed at [facility], [address] [place], [place] [address]    Culture  Setup Time   Final    IN BOTH AEROBIC AND ANAEROBIC BOTTLES NO ORGANISMS SEEN Performed at [facility],  [place], [place] [address]    Culture (A)  Final    STREPTOCOCCUS INTERMEDIUS SUSCEPTIBILITIES PERFORMED ON PREVIOUS CULTURE WITHIN THE LAST 5 DAYS. Performed at [facility], [place] [address], [place], [place] [address]    Report Status 03/19/2020 FINAL  Final  Culture, blood (Routine x 2)     Status: Abnormal   Collection Time: 03/15/20  5:49 PM   Specimen: BLOOD  Result Value Ref Range Status   Specimen Description   Final    BLOOD RAC Performed at [facility], [address] [place], [place] [address]    Special Requests   Final    BOTTLES DRAWN AEROBIC AND ANAEROBIC Blood Culture adequate volume Performed at [facility], [address] [place], [place] [address]    Culture  Setup Time   Final    Organism ID to follow IN BOTH AEROBIC AND ANAEROBIC BOTTLES GRAM POSITIVE COCCI CRITICAL RESULT CALLED TO, READ BACK BY AND VERIFIED WITH:  DAVID BESANTI _0  03/17/2020 TTG Performed at [facility], [address] [place], [place] [address]    Culture STREPTOCOCCUS INTERMEDIUS (A)  Final   Report Status 03/19/2020 FINAL  Final   Organism ID, Bacteria STREPTOCOCCUS INTERMEDIUS  Final      Susceptibility   Streptococcus intermedius - MIC*    PENICILLIN <=0.06 SENSITIVE Sensitive     CEFTRIAXONE <=0.12 SENSITIVE Sensitive     ERYTHROMYCIN <=0.12 SENSITIVE Sensitive     LEVOFLOXACIN 0.5 SENSITIVE Sensitive     VANCOMYCIN 0.5 SENSITIVE Sensitive     * STREPTOCOCCUS INTERMEDIUS  Blood Culture ID Panel (Reflexed)     Status: Abnormal   Collection Time: 03/15/20  5:49 PM  Result Value Ref Range Status   Enterococcus species NOT DETECTED NOT DETECTED Final   Listeria monocytogenes NOT DETECTED NOT DETECTED Final   Staphylococcus species NOT DETECTED NOT DETECTED Final   Staphylococcus aureus (BCID) NOT DETECTED NOT DETECTED Final   Streptococcus species DETECTED (A) NOT DETECTED Final    Comment: Not Enterococcus species, Streptococcus agalactiae, Streptococcus pyogenes, or Streptococcus pneumoniae. CRITICAL RESULT CALLED TO, READ BACK BY AND VERIFIED WITH: DAVID BESANTI _1  03/17/2020 TTG    Streptococcus agalactiae NOT DETECTED NOT DETECTED Final   Streptococcus pneumoniae NOT DETECTED NOT DETECTED Final   Streptococcus pyogenes NOT DETECTED NOT DETECTED Final   Acinetobacter baumannii NOT DETECTED NOT DETECTED Final   Enterobacteriaceae species NOT DETECTED NOT DETECTED Final   Enterobacter cloacae complex NOT DETECTED NOT DETECTED Final   Escherichia coli NOT DETECTED NOT DETECTED Final   Klebsiella oxytoca NOT DETECTED NOT DETECTED Final   Klebsiella pneumoniae NOT DETECTED NOT DETECTED Final   Proteus species NOT DETECTED NOT DETECTED Final   Serratia marcescens NOT DETECTED NOT DETECTED Final   Haemophilus influenzae NOT DETECTED NOT DETECTED Final   Neisseria meningitidis NOT DETECTED NOT DETECTED Final    Pseudomonas aeruginosa NOT DETECTED NOT DETECTED Final   Candida albicans NOT DETECTED NOT DETECTED Final   Candida glabrata NOT DETECTED NOT DETECTED Final   Candida krusei NOT DETECTED NOT DETECTED Final   Candida parapsilosis NOT DETECTED NOT DETECTED Final   Candida tropicalis NOT DETECTED NOT DETECTED Final    Comment: Performed at [facility], [address] [place], [place] [address]  SARS Coronavirus 2 by RT PCR (hospital order, performed in [facility] hospital lab) Nasopharyngeal Nasopharyngeal Swab     Status: None   Collection Time: 03/15/20  8:42 PM  Specimen: Nasopharyngeal Swab  Result Value Ref Range Status   SARS Coronavirus 2 NEGATIVE NEGATIVE Final    Comment: (NOTE) SARS-CoV-2 target nucleic acids are NOT DETECTED.  The SARS-CoV-2 RNA is generally detectable in upper and lower respiratory specimens during the acute phase of infection. The lowest concentration of SARS-CoV-2 viral copies this assay can detect is 250 copies / mL. A negative result does not preclude SARS-CoV-2 infection and should not be used as the sole basis for treatment or other patient management decisions.  A negative result may occur with improper specimen collection / handling, submission of specimen other than nasopharyngeal swab, presence of viral mutation(s) within the areas targeted by this assay, and inadequate number of viral copies (<250 copies / mL). A negative result must be combined with clinical observations, patient history, and epidemiological information.  Fact Sheet for Patients:   StrictlyIdeas.no  Fact Sheet for Healthcare Providers: BankingDealers.co.za  This test is not yet approved or  cleared by the [place] FDA and has been authorized for detection and/or diagnosis of SARS-CoV-2 by FDA under an Emergency Use Authorization (EUA).  This EUA will remain in effect (meaning this test can be used) for the  duration of the COVID-19 declaration under Section 564(b)(1) of the Act, 21 U.S.C. section 360bbb-3(b)(1), unless the authorization is terminated or revoked sooner.  Performed at [facility], [address], [place], [place] [address]   Urine culture     Status: None   Collection Time: 03/15/20  8:42 PM   Specimen: Urine, Random  Result Value Ref Range Status   Specimen Description   Final    URINE, RANDOM Performed at [facility], [address], [place], [place] [address]    Special Requests   Final    NONE Performed at [facility], [address], [place], [place] [address]    Culture   Final    NO GROWTH Performed at [facility], [place] [address], [place], [place] [address]    Report Status 03/17/2020 FINAL  Final  MRSA PCR Screening     Status: None   Collection Time: 03/16/20  5:00 AM   Specimen: Nasopharyngeal  Result Value Ref Range Status   MRSA by PCR NEGATIVE NEGATIVE Final    Comment:        The GeneXpert MRSA Assay (FDA approved for NASAL specimens only), is one component of a comprehensive MRSA colonization surveillance program. It is not intended to diagnose MRSA infection nor to guide or monitor treatment for MRSA infections. Performed at [facility], [address] [place], [place] [address]   CULTURE, BLOOD (ROUTINE X 2) w Reflex to ID Panel     Status: None (Preliminary result)   Collection Time: 03/17/20  7:44 AM   Specimen: BLOOD  Result Value Ref Range Status   Specimen Description BLOOD LFA  Final   Special Requests   Final    BOTTLES DRAWN AEROBIC AND ANAEROBIC Blood Culture adequate volume   Culture   Final    NO GROWTH 2 DAYS Performed at [facility], [address], [place], [place] [address]    Report Status PENDING  Incomplete  CULTURE, BLOOD (ROUTINE X 2) w Reflex to ID Panel     Status: None (Preliminary result)   Collection Time: 03/17/20  7:51 AM   Specimen: BLOOD    Result Value Ref Range Status   Specimen Description BLOOD RAC  Final   Special Requests  Final    BOTTLES DRAWN AEROBIC AND ANAEROBIC Blood Culture adequate volume   Culture   Final    NO GROWTH 2 DAYS Performed at [facility], [address] [place], [place] [address]    Report Status PENDING  Incomplete         Radiology Studies: US Guided Needle Placement  Result Date: 03/19/2020 INDICATION: History of ovarian cancer, enlarging hepatic lesions EXAM: ULTRASOUND CORE BIOPSY RIGHT INFERIOR HEPATIC LESION ULTRASOUND ASPIRATION RIGHT INFERIOR HEPATIC LESION (FOR CULTURE) MEDICATIONS: 1% ANESTHESIA/SEDATION: Moderate (conscious) sedation was employed during this procedure. A total of Versed 0.5 mg and Fentanyl 50 mcg was administered intravenously. Moderate Sedation Time: 15 minutes. The patient's level of consciousness and vital signs were monitored continuously by radiology nursing throughout the procedure under my direct supervision. FLUOROSCOPY TIME:  Fluoroscopy Time: NONE. COMPLICATIONS: None immediate. PROCEDURE: Informed written consent was obtained from the patient's family after a thorough discussion of the procedural risks, benefits and alternatives. All questions were addressed. Maximal Sterile Barrier Technique was utilized including caps, mask, sterile gowns, sterile gloves, sterile drape, hand hygiene and skin antiseptic. A timeout was performed prior to the initiation of the procedure. Previous imaging reviewed. Preliminary ultrasound performed. A heterogeneous mixed echogenicity right inferior liver mass was localized and marked in the mid axillary line through a lower intercostal space. This lesion was correlated with the prior CT from 03/15/2020. Of note, the lesion is complex and echogenic and more solid by ultrasound than a fluid collection or abscess. Under sterile conditions and local anesthesia, a 17 gauge coaxial guide needle was advanced to the margin of the  lesion. Needle position confirmed with ultrasound. Images obtained for documentation. Several core biopsies were obtained of the lesion. Sampling was fragmented but core biopsies were obtained. Samples placed in formalin. Also, the 17 gauge needle was utilized to perform aspiration of a more central cystic or necrotic component. Approximately 8 cc exudative bloody fluid was aspirated. This was sent for culture. Needle removed. Postprocedure imaging demonstrates no hemorrhage or hematoma. Patient tolerated the biopsy well. IMPRESSION: Successful ultrasound right inferior liver core biopsy and aspiration as above. Sample sent for surgical pathology and culture. Electronically Signed   By: Jerilynn Mages.  Shick M.D.   On: 03/19/2020 12:57   US BIOPSY (LIVER)  Result Date: 03/19/2020 INDICATION: History of ovarian cancer, enlarging hepatic lesions EXAM: ULTRASOUND CORE BIOPSY RIGHT INFERIOR HEPATIC LESION ULTRASOUND ASPIRATION RIGHT INFERIOR HEPATIC LESION (FOR CULTURE) MEDICATIONS: 1% ANESTHESIA/SEDATION: Moderate (conscious) sedation was employed during this procedure. A total of Versed 0.5 mg and Fentanyl 50 mcg was administered intravenously. Moderate Sedation Time: 15 minutes. The patient's level of consciousness and vital signs were monitored continuously by radiology nursing throughout the procedure under my direct supervision. FLUOROSCOPY TIME:  Fluoroscopy Time: NONE. COMPLICATIONS: None immediate. PROCEDURE: Informed written consent was obtained from the patient's family after a thorough discussion of the procedural risks, benefits and alternatives. All questions were addressed. Maximal Sterile Barrier Technique was utilized including caps, mask, sterile gowns, sterile gloves, sterile drape, hand hygiene and skin antiseptic. A timeout was performed prior to the initiation of the procedure. Previous imaging reviewed. Preliminary ultrasound performed. A heterogeneous mixed echogenicity right inferior liver mass was  localized and marked in the mid axillary line through a lower intercostal space. This lesion was correlated with the prior CT from 03/15/2020. Of note, the lesion is complex and echogenic and more solid by ultrasound than a fluid collection or abscess. Under sterile conditions and local anesthesia,  a 17 gauge coaxial guide needle was advanced to the margin of the lesion. Needle position confirmed with ultrasound. Images obtained for documentation. Several core biopsies were obtained of the lesion. Sampling was fragmented but core biopsies were obtained. Samples placed in formalin. Also, the 17 gauge needle was utilized to perform aspiration of a more central cystic or necrotic component. Approximately 8 cc exudative bloody fluid was aspirated. This was sent for culture. Needle removed. Postprocedure imaging demonstrates no hemorrhage or hematoma. Patient tolerated the biopsy well. IMPRESSION: Successful ultrasound right inferior liver core biopsy and aspiration as above. Sample sent for surgical pathology and culture. Electronically Signed   By: Jerilynn Mages.  Shick M.D.   On: 03/19/2020 12:57   DG Shoulder Left  Result Date: 03/18/2020 CLINICAL DATA:  Left shoulder pain EXAM: LEFT SHOULDER - 2+ VIEW COMPARISON:  None. FINDINGS: Alignment appears anatomic. There is no acute fracture. No substantial joint space narrowing. Mild degenerative changes at the clavicular joint. Small subacromial spur. IMPRESSION: No significant osseous abnormality. Electronically Signed   By: Macy Mis M.D.   On: 03/18/2020 15:43   ECHOCARDIOGRAM COMPLETE  Result Date: 03/17/2020    ECHOCARDIOGRAM REPORT   Patient Name:   Tamara Mckinney Date of Exam: 03/17/2020 Medical Rec #:  [id number]      Height:       61.0 in Accession #:    [id number]     Weight:       95.0 lb Date of Birth:  May 28, 1928      BSA:          1.376 m Patient Age:    38 years       BP:           124/76 mmHg Patient Gender: F              HR:           90 bpm. Exam Location:   [facility] Procedure: 2D Echo Indications:     CHF-ACUTE DIASTOLIC 081.44/Y18.56  History:         Patient has no prior history of Echocardiogram examinations.                  Risk Factors:Hypertension. ASTHMA.  Sonographer:     Avanell Shackleton Referring Phys:  [id number] Sharen Hones Diagnosing Phys: Kirk Ruths MD IMPRESSIONS  1. Left ventricular ejection fraction, by estimation, is 60 to 65%. The left ventricle has normal function. The left ventricle has no regional wall motion abnormalities. There is mild left ventricular hypertrophy. Left ventricular diastolic parameters are consistent with Grade I diastolic dysfunction (impaired relaxation). Elevated left atrial pressure.  2. Right ventricular systolic function is normal. The right ventricular size is normal. There is normal pulmonary artery systolic pressure.  3. Left atrial size was mildly dilated.  4. Moderate pleural effusion in the left lateral region.  5. The mitral valve is normal in structure. Trivial mitral valve regurgitation. No evidence of mitral stenosis.  6. The aortic valve was not well visualized. Aortic valve regurgitation is not visualized. No aortic stenosis is present.  7. The inferior vena cava is normal in size with greater than 50% respiratory variability, suggesting right atrial pressure of 3 mmHg. FINDINGS  Left Ventricle: Left ventricular ejection fraction, by estimation, is 60 to 65%. The left ventricle has normal function. The left ventricle has no regional wall motion abnormalities. The left ventricular internal cavity size was normal in size. There is  mild left ventricular hypertrophy. Left  ventricular diastolic parameters are consistent with Grade I diastolic dysfunction (impaired relaxation). Elevated left atrial pressure. Right Ventricle: The right ventricular size is normal.Right ventricular systolic function is normal. There is normal pulmonary artery systolic pressure. The tricuspid regurgitant velocity is 2.62 m/s, and with an  assumed right atrial pressure of 3 mmHg, the estimated right ventricular systolic pressure is 81.4 mmHg. Left Atrium: Left atrial size was mildly dilated. Right Atrium: Right atrial size was normal in size. Pericardium: There is no evidence of pericardial effusion. Mitral Valve: The mitral valve is normal in structure. Normal mobility of the mitral valve leaflets. Mild mitral annular calcification. Trivial mitral valve regurgitation. No evidence of mitral valve stenosis. Tricuspid Valve: The tricuspid valve is normal in structure. Tricuspid valve regurgitation is mild . No evidence of tricuspid stenosis. Aortic Valve: The aortic valve was not well visualized. Aortic valve regurgitation is not visualized. No aortic stenosis is present. Aortic valve mean gradient measures 7.7 mmHg. Aortic valve peak gradient measures 14.0 mmHg. Aortic valve area, by VTI measures 1.30 cm. Pulmonic Valve: The pulmonic valve was not well visualized. Pulmonic valve regurgitation is not visualized. No evidence of pulmonic stenosis. Aorta: The aortic root is normal in size and structure. Venous: The inferior vena cava is normal in size with greater than 50% respiratory variability, suggesting right atrial pressure of 3 mmHg. IAS/Shunts: No atrial level shunt detected by color flow Doppler. Additional Comments: There is a moderate pleural effusion in the left lateral region.  LEFT VENTRICLE PLAX 2D LVIDd:         3.42 cm  Diastology LVIDs:         2.04 cm  LV e' lateral:   6.42 cm/s LV PW:         0.75 cm  LV E/e' lateral: 19.0 LV IVS:        1.08 cm  LV e' medial:    7.83 cm/s LVOT diam:     1.50 cm  LV E/e' medial:  15.6 LV SV:         47 LV SV Index:   34 LVOT Area:     1.77 cm  IVC IVC diam: 1.69 cm LEFT ATRIUM             Index       RIGHT ATRIUM           Index LA diam:        3.60 cm 2.62 cm/m  RA Area:     12.00 cm LA Vol (A2C):   51.2 ml 37.20 ml/m RA Volume:   23.00 ml  16.71 ml/m LA Vol (A4C):   39.7 ml 28.84 ml/m LA  Biplane Vol: 46.7 ml 33.93 ml/m  AORTIC VALVE AV Area (Vmax):    1.19 cm AV Area (Vmean):   1.28 cm AV Area (VTI):     1.30 cm AV Vmax:           187.33 cm/s AV Vmean:          130.000 cm/s AV VTI:            0.359 m AV Peak Grad:      14.0 mmHg AV Mean Grad:      7.7 mmHg LVOT Vmax:         126.50 cm/s LVOT Vmean:        94.250 cm/s LVOT VTI:          0.264 m LVOT/AV VTI ratio: 0.73  AORTA Ao Root diam: 2.70  cm MITRAL VALVE                TRICUSPID VALVE MV Area (PHT): 4.10 cm     TR Peak grad:   27.5 mmHg MV Decel Time: 185 msec     TR Vmax:        262.00 cm/s MV E velocity: 122.00 cm/s MV A velocity: 157.00 cm/s  SHUNTS MV E/A ratio:  0.78         Systemic VTI:  0.26 m                             Systemic Diam: 1.50 cm Kirk Ruths MD Electronically signed by Kirk Ruths MD Signature Date/Time: 03/17/2020/3:04:49 PM    Final         Scheduled Meds: . Chlorhexidine Gluconate Cloth  6 each Topical Daily  . enoxaparin (LOVENOX) injection  30 mg Subcutaneous Q24H  . [START ON 03/20/2020] feeding supplement (ENSURE ENLIVE)  237 mL Oral BID BM  . lidocaine  1 patch Transdermal Q24H  . midazolam      . pantoprazole  40 mg Oral Daily  . predniSONE  40 mg Oral Q breakfast  . sodium chloride flush  10 mL Intravenous Q12H   Continuous Infusions: . sodium chloride    . sodium chloride Stopped (03/17/20 1749)  . cefTRIAXone (ROCEPHIN)  IV 2 g (03/19/20 0450)  . metronidazole 500 mg (03/18/20 2215)     LOS: 4 days    Time spent: 27 minutes    Sharen Hones, MD [facility]   To contact the attending provider between 7A-7P or the covering provider during after hours 7P-7A, please log into the web site [email or web address] and access using universal [place] password for that web site. If you do not have the password, please call the hospital operator.  03/19/2020, 2:11 PM

## 2020-03-19 NOTE — Procedures (Signed)
Interventional Radiology Procedure Note  Procedure: Ultrasound guided core biopsy of liver lesion with aspirate    Complications: None  Estimated Blood Loss:  <10 mL  Findings: Successful 18 ga core biopsy x5 with aspirate for culture.    Tamera Punt, MD

## 2020-03-19 NOTE — Evaluation (Signed)
Occupational Therapy Evaluation Patient Details Name: Tamara Mckinney MRN: [id number] DOB: May 13, 1928 Today's Date: 03/19/2020    History of Present Illness Pt is a 86 yo female that presented to ED for fever, weakness, and fatigue PMH includes ovarian cancer, asthma, GERD, HTN. Also receives single agent gecitabine on 03/14/2020.  Imaging positive for new hepatic masses, guided mass aspiration/biopsy performed 8/3.   Clinical Impression   Pt was seen for OT evaluation this date. Prior to hospital admission, pt was MOD I with I/ADLs including cooking meals for herself and sons. Pt lives alone, but sons live next door/in close proximity. Currently pt demonstrates impairments as described below (See OT problem list) which functionally limit her ability to perform ADL/self-care tasks. Pt currently requires Supv with t/f's with RW, and MIN A with LB ADLs.  Pt would benefit from skilled OT to address noted impairments and functional limitations (see below for any additional details) in order to maximize safety and independence while minimizing falls risk and caregiver burden. Upon hospital discharge, recommend HHOT to maximize pt safety and return to functional independence during meaningful occupations of daily life.     Follow Up Recommendations  Home health OT    Equipment Recommendations  3 in 1 bedside commode    Recommendations for Other Services       Precautions / Restrictions Precautions Precautions: Fall Restrictions Weight Bearing Restrictions: No      Mobility Bed Mobility Overal bed mobility: Needs Assistance Bed Mobility: Supine to Sit     Supine to sit: Min assist     General bed mobility comments: very light minA  Transfers Overall transfer level: Needs assistance Equipment used: 4-wheeled walker Transfers: Sit to/from Stand Sit to Stand: Supervision         General transfer comment: cues for hand placement    Balance Overall balance assessment: Needs  assistance Sitting-balance support: Feet supported Sitting balance-Leahy Scale: Fair       Standing balance-Leahy Scale: Poor Standing balance comment: reliant on UE support                           ADL either performed or assessed with clinical judgement   ADL                                         General ADL Comments: Pt requires MIN A with LB ADLs including dressing d/t some report of R hip pain     Vision Patient Visual Report: No change from baseline       Perception     Praxis      Pertinent Vitals/Pain Pain Assessment: Faces Faces Pain Scale: Hurts even more Pain Location: R flank/shoulder with mobility Pain Descriptors / Indicators: Aching;Moaning;Grimacing;Guarding Pain Intervention(s): Limited activity within patient's tolerance;Monitored during session;Repositioned     Hand Dominance     Extremity/Trunk Assessment Upper Extremity Assessment Upper Extremity Assessment: Generalized weakness   Lower Extremity Assessment Lower Extremity Assessment: Generalized weakness   Cervical / Trunk Assessment Cervical / Trunk Assessment: Kyphotic   Communication Communication Communication: No difficulties   Cognition Arousal/Alertness: Awake/alert Behavior During Therapy: WFL for tasks assessed/performed Overall Cognitive Status: Within Functional Limits for tasks assessed  General Comments       Exercises Other Exercises Other Exercises: OT faciltiates education with pt and sons re: role of OT and safety with ADLs.   Shoulder Instructions      Home Living Family/patient expects to be discharged to:: Private residence Living Arrangements: Alone;Children (sons live next door) Available Help at Discharge: Family;Available 24 hours/day Type of Home: House Home Access: Stairs to enter [employer] of Steps: 1 Entrance Stairs-Rails: None Home Layout: One level      Bathroom Shower/Tub: Occupational psychologist: Standard Bathroom Accessibility: Yes   Home Equipment: Cane - single point;Walker - 4 wheels;Shower seat;Grab bars - toilet;Grab bars - tub/shower          Prior Functioning/Environment Level of Independence: Independent with assistive device(s)        Comments: rollator for ambulation. Has a house cleaner. Pt reported she still does her own cooking. son checks on her 2-3 times a day.        OT Problem List: Decreased strength;Decreased activity tolerance;Impaired balance (sitting and/or standing);Decreased cognition      OT Treatment/Interventions: Self-care/ADL training;Therapeutic exercise;DME and/or AE instruction;Therapeutic activities;Energy conservation;Patient/family education    OT Goals(Current goals can be found in the care plan section) Acute Rehab OT Goals Patient Stated Goal: to go home OT Goal Formulation: With patient Time For Goal Achievement: 04/02/20 Potential to Achieve Goals: Good  OT Frequency: Min 2X/week   Barriers to D/C:            Co-evaluation              AM-PAC OT "6 Clicks" Daily Activity     Outcome Measure Help from another person eating meals?: None Help from another person taking care of personal grooming?: A Little Help from another person toileting, which includes using toliet, bedpan, or urinal?: A Lot Help from another person bathing (including washing, rinsing, drying)?: A Lot Help from another person to put on and taking off regular upper body clothing?: A Little Help from another person to put on and taking off regular lower body clothing?: A Lot 6 Click Score: 16   End of Session Equipment Utilized During Treatment: Gait belt;Rolling walker  Activity Tolerance: Patient tolerated treatment well Patient left: in chair;with call bell/phone within reach;with chair alarm set  OT Visit Diagnosis: Unsteadiness on feet (R26.81);Muscle weakness (generalized) (M62.81)                 Time: 8841-6606 OT Time Calculation (min): 17 min Charges:  OT General Charges $OT Visit: 1 Visit OT Evaluation $OT Eval Moderate Complexity: [place], MS, OTR/L ascom [phone] 03/19/20, 4:44 PM

## 2020-03-19 NOTE — Progress Notes (Signed)
Initial Nutrition Assessment  DOCUMENTATION CODES:   Severe malnutrition in context of chronic illness, Underweight  INTERVENTION:  Provide Ensure Enlive po BID, each supplement provides 350 kcal and 20 grams of protein.  Provide Magic cup BID with lunch and dinner, each supplement provides 290 kcal and 9 grams of protein.  NUTRITION DIAGNOSIS:   Severe Malnutrition related to chronic illness (recurrent ovarian cancer) as evidenced by severe fat depletion, severe muscle depletion.  GOAL:   Patient will meet greater than or equal to 90% of their needs  MONITOR:   PO intake, Supplement acceptance, Labs, Weight trends, I & O's  REASON FOR ASSESSMENT:   Other (Comment) (Low BMI)    ASSESSMENT:   86 year old female with PMHx of asthma, arthritis, anemia, GERD, HTN, recurrent ovarian cancer receiving single agent gemcitabine for palliative treatment (last infusion 03/14/2020) admitted with streptococcus intermedius bacteremia, also with multiple liver lesions concerning for hepatic abscesses.   -Plan is for liver aspiration to rule out liver abscess.  Met with patient and her sons at bedside. Patient reports she has had a decreased appetite and anorexia (lack of hunger) for a while now. She still eats well at meals because she knows she needs the nutrition. Patient typically has 3 meals per day. For breakfast she has eggs, toast, milk, and coffee. For lunch and dinner she reports she has a protein at each meal. Patient has had Ensure/Boost in the past when she first started chemotherapy but does not drink them regularly anymore. She is currently NPO for planned procedure today. She is amenable to trying Ensure once diet is advanced. Patient also enjoys ice cream. Encouraged intake of calorie- and protein-dense foods. Diet was liberalized to regular yesterday after family discussed with MD - agree with liberalization.  Patient reports she has been weight-stable at approximately 99 lbs for  a while now. Per chart weight fluctuates between 43-45 kg. Patient is currently 43.1 kg (95 lbs).  Medications reviewed and include: Protonix, prednisone 40 mg daily, ceftriaxone, Flagyl.  Labs reviewed: CBG 70-105, Sodium 131, BUN 24.  NUTRITION - FOCUSED PHYSICAL EXAM:    Most Recent Value  Orbital Region Severe depletion  Upper Arm Region Severe depletion  Thoracic and Lumbar Region Severe depletion  Buccal Region Severe depletion  Temple Region Severe depletion  Clavicle Bone Region Severe depletion  Clavicle and Acromion Bone Region Severe depletion  Scapular Bone Region Unable to assess  Dorsal Hand Severe depletion  Patellar Region Severe depletion  Anterior Thigh Region Severe depletion  Posterior Calf Region Severe depletion  Edema (RD Assessment) None  Hair Reviewed  Eyes Reviewed  Mouth Reviewed  Skin Reviewed  Nails Reviewed     Diet Order:   Diet Order            Diet NPO time specified  Diet effective midnight                EDUCATION NEEDS:   No education needs have been identified at this time  Skin:  Skin Assessment: Reviewed RN Assessment  Last BM:  03/18/2020 per chart  Height:   Ht Readings from Last 1 Encounters:  03/15/20 '5\' 1"'$  (1.549 m)   Weight:   Wt Readings from Last 1 Encounters:  03/15/20 (!) 43.1 kg   Ideal Body Weight:  47.7 kg  BMI:  Body mass index is 17.95 kg/m.  Estimated Nutritional Needs:   Kcal:  1300-1500  Protein:  65-75 grams  Fluid:  1.2-1.5 L/day  Jacklynn Barnacle, MS, RD, LDN Pager number available on Amion

## 2020-03-20 DIAGNOSIS — B955 Unspecified streptococcus as the cause of diseases classified elsewhere: Secondary | ICD-10-CM

## 2020-03-20 DIAGNOSIS — R5381 Other malaise: Secondary | ICD-10-CM

## 2020-03-20 DIAGNOSIS — M25512 Pain in left shoulder: Secondary | ICD-10-CM

## 2020-03-20 DIAGNOSIS — D509 Iron deficiency anemia, unspecified: Secondary | ICD-10-CM

## 2020-03-20 DIAGNOSIS — K750 Abscess of liver: Secondary | ICD-10-CM

## 2020-03-20 DIAGNOSIS — R7881 Bacteremia: Secondary | ICD-10-CM

## 2020-03-20 DIAGNOSIS — R160 Hepatomegaly, not elsewhere classified: Secondary | ICD-10-CM

## 2020-03-20 DIAGNOSIS — M545 Low back pain: Secondary | ICD-10-CM

## 2020-03-20 MED ORDER — OXYCODONE HCL 5 MG PO TABS
5.0000 mg | ORAL_TABLET | Freq: Four times a day (QID) | ORAL | Status: DC | PRN
  Administered 2020-03-20 – 2020-03-21 (×3): 5 mg via ORAL
  Filled 2020-03-20 (×3): qty 1

## 2020-03-20 NOTE — Progress Notes (Signed)
ID Gram stain of liver aspirate shows gram positive cocci and WBC Await final culture Will need TEE to r/o endocarditis Discussed with patient and son

## 2020-03-20 NOTE — Progress Notes (Signed)
Physical Therapy Treatment Patient Details Name: Tamara Mckinney MRN: [id number] DOB: 01-05-1928 Today's Date: 03/20/2020    History of Present Illness Pt is a 86 yo female that presented to ED for fever, weakness, and fatigue PMH includes ovarian cancer, asthma, GERD, HTN. Also receives single agent gecitabine on 03/14/2020.  Imaging positive for new hepatic masses, guided mass aspiration/biopsy performed 8/3.    PT Comments    Pt alert, in chair, family at bedside. Pt reported low back pain/R side pain, 8/10 PT informed RN request of pain medication after session. The patient was able to perform sit <> stand from recliner with CGA and rollator, first attempt pt very dizzy and needed a seated rest break. Second attempt pt reported decreased dizziness, and ambulated ~32ft with CGA and rollator, close chair follow for safety. HR ranged from 110s-120s, max 124 BPM. Further mobility deferred due to elevated HR and IV pole battery depleted. The patient would benefit from further skilled PT intervention to continue to progress towards goals. Recommendation remains appropriate.    Follow Up Recommendations  Home health PT;Supervision/Assistance - 24 hour     Equipment Recommendations  None recommended by PT    Recommendations for Other Services OT consult     Precautions / Restrictions Precautions Precautions: Fall Restrictions Weight Bearing Restrictions: No    Mobility  Bed Mobility               General bed mobility comments: pt up in chair at start of session  Transfers Overall transfer level: Needs assistance Equipment used: 4-wheeled walker Transfers: Sit to/from Stand Sit to Stand: Supervision;Min guard         General transfer comment: cues for hand placement  Ambulation/Gait Ambulation/Gait assistance: Min guard Gait Distance (Feet): 60 Feet Assistive device: 4-wheeled walker       General Gait Details: HR in 110s-low 120s with ambulation, more sustained in  120s than previous admission. Further ambulation held due to elevated HR (and IV pole battery depleted).   Stairs             Wheelchair Mobility    Modified Rankin (Stroke Patients Only)       Balance Overall balance assessment: Needs assistance Sitting-balance support: Feet supported Sitting balance-Leahy Scale: Fair       Standing balance-Leahy Scale: Poor Standing balance comment: reliant on UE support                            Cognition Arousal/Alertness: Awake/alert Behavior During Therapy: WFL for tasks assessed/performed Overall Cognitive Status: Within Functional Limits for tasks assessed                                        Exercises Other Exercises Other Exercises: initial sit to stand pt reported dizziness needed seated rest break BP assessed in sitting (143/79), in standing 138/85.Pt reported decreased dizziness with second attempt    General Comments        Pertinent Vitals/Pain Pain Assessment: Faces Pain Score: 8  Pain Location: R flank/low back Pain Descriptors / Indicators: Aching;Moaning;Grimacing;Guarding Pain Intervention(s): Limited activity within patient's tolerance;Monitored during session;Repositioned;Patient requesting pain meds-RN notified    Home Living                      Prior Function  PT Goals (current goals can now be found in the care plan section) Progress towards PT goals: Progressing toward goals    Frequency    Min 2X/week      PT Plan Current plan remains appropriate    Co-evaluation              AM-PAC PT "6 Clicks" Mobility   Outcome Measure  Help needed turning from your back to your side while in a flat bed without using bedrails?: A Little Help needed moving from lying on your back to sitting on the side of a flat bed without using bedrails?: A Little Help needed moving to and from a bed to a chair (including a wheelchair)?: A Little Help  needed standing up from a chair using your arms (e.g., wheelchair or bedside chair)?: A Little Help needed to walk in hospital room?: A Little Help needed climbing 3-5 steps with a railing? : A Lot 6 Click Score: 17    End of Session Equipment Utilized During Treatment: Gait belt Activity Tolerance: Patient tolerated treatment well Patient left: in chair;with family/visitor present;with call bell/phone within reach;with chair alarm set Nurse Communication: Mobility status PT Visit Diagnosis: Muscle weakness (generalized) (M62.81);Other abnormalities of gait and mobility (R26.89);Difficulty in walking, not elsewhere classified (R26.2)     Time: 8592-7639 PT Time Calculation (min) (ACUTE ONLY): 26 min  Charges:  $Therapeutic Exercise: 23-37 mins                    Lieutenant Diego PT, DPT 10:43 AM,03/20/20

## 2020-03-20 NOTE — Progress Notes (Signed)
Patient had extended visit with oldest son this evening, and she is in her usual pleasant disposition. RN infused antibiotics as ordered. Patient requested PureWick placement prior to HS to prevent sleep interruption. She became uncomfortable with her positioning in bed. States her chronic back pain is preventing her from getting to and staying asleep. RN repositioned her pillows and blankets; also gave Tylenol for pain. She is resting well presently. She remains afebrile; vital signs are stable; breathing is even and unlabored on room air. Will continue to monitor.

## 2020-03-20 NOTE — Progress Notes (Signed)
Patient having some back discomfort and pain. RN repositioned pillows and gave Tylenol. Patient states she feels more comfortable in bed now. Will monitor for effectiveness of pain relief

## 2020-03-20 NOTE — Progress Notes (Signed)
PROGRESS NOTE  Tamara Mckinney:[id number] DOB: [birth year]   PCP: [name], MD  Patient is from: Home.  Uses walker at baseline.  DOA: 03/15/2020 LOS: 5  Brief Narrative / Interim history: 86 year old female with history of ovarian cancer on chemotherapy, lupus anticoagulant, HTN, asthma, GERD, osteoarthritis and anemia of chronic disease presenting with fever for 1 day.  CT abdomen and pelvis concerning for new hepatic mass/abscess, and known tumors.  He was started on vancomycin, cefepime and Flagyl.  RUQ [place] did not reveal evidence of abscess.  Blood culture with Streptococcus.  ID consulted.  Antibiotics switched to Rocephin.  ID recommended liver aspiration to exclude liver abscess which was performed on 8/3.  Culture and pathology pending.  Hospital course complicated by acute respiratory failure with hypoxia likely due to IV fluid resuscitation.  Received IV Lasix.  Eventually liberated of oxygen.  Subjective: Seen and examined earlier this morning.  No major events overnight of this morning.  Complaints right-sided back pain.  She attributes this to lying in the bed.  Denies chest pain, shortness of breath, abdominal pain, nausea, vomiting or UTI symptoms.  Patient's son at bedside.   Objective: Vitals:   03/20/20 0032 03/20/20 0447 03/20/20 0812 03/20/20 1212  BP: (!) 149/88 (!) 152/88 (!) 149/81 133/74  Pulse: 75 89 64   Resp:  18 17 19   Temp: 98.6 F (37 C) 97.7 F (36.5 C) 97.6 F (36.4 C) 98.2 F (36.8 C)  TempSrc: Oral Axillary Oral Oral  SpO2: 95% 97% 98% 98%  Weight:      Height:        Intake/Output Summary (Last 24 hours) at 03/20/2020 1226 Last data filed at 03/20/2020 1009 Gross per 24 hour  Intake 480 ml  Output 400 ml  Net 80 ml   Filed Weights   03/15/20 1733  Weight: (!) 43.1 kg    Examination:  GENERAL: Frail looking elderly female.  No distress.  Nontoxic. HEENT: MMM.  Vision and hearing grossly intact.  NECK: Supple.  No apparent JVD.    RESP: On RA.  No IWOB.  Fair aeration bilaterally. CVS:  RRR. Heart sounds normal.  ABD/GI/GU: BS+. Abd soft, NTND.  MSK/EXT:  Moves extremities.  No tenderness over spinous process.  Significant muscle mass and subcu fat loss. SKIN: no apparent skin lesion or wound NEURO: Awake, alert and oriented fairly.  No apparent focal neuro deficit. PSYCH: Calm. Normal affect.  Procedures:  03/19/2020-US guided liver biopsy  Microbiology summarized: 7/30-COVID-19 PCR negative. 7/30-blood culture with pansensitive Streptococcus intermedius 7/31-MRSA PCR negative. 80/3-liver abscess culture NGTD.  Gram stain with moderate GPC.   Assessment & Plan: Sepsis due to streptococcus bacteremia-concern about liver abscess as a source.  Liver aspiration culture NGTD but Gram stain with GPC's.  Sepsis physiology -Appreciate input by ID -Continue IV Rocephin  Acute hypoxemic respiratory failure with hypoxia likely due to fluid overload from IV fluid resuscitation for sepsis.  Echo with normal EF, G1 DD.  Not on diuretics.  Respiratory failure resolved. -Monitor fluid status  Iron deficiency anemia the 8-9.  Stable. -Status post IV iron -Continue p.o. iron  Hyponatremia: SIADH?  In the setting of malignancy.  Stable. -Continue monitoring  Ovarian cancer with metastasis: On chemotherapy. -Outpatient follow-up with oncology  Asthma exacerbation/bronchospasm:  Improved after steroid -Continue prednisone and breathing treatments.  Left shoulder pain: X-ray without fracture. Right lower back pain-no tenderness over spinous processes.  No radiculopathy. -Continue Lidoderm patch and as needed analgesics.  Debility/physical deconditioning: -OOB/PT/OT  Severe malnutrition: Body mass index is 17.95 kg/m. Nutrition Problem: Severe Malnutrition Etiology: chronic illness (recurrent ovarian cancer) Signs/Symptoms: severe fat depletion, severe muscle depletion Interventions: Refer to RD note for  recommendations   DVT prophylaxis:  enoxaparin (LOVENOX) injection 30 mg Start: 03/16/20 2300  Code Status: Full code. Family Communication: Updated patient's son at bedside. Status is: Inpatient  Remains inpatient appropriate because:Ongoing diagnostic testing needed not appropriate for outpatient work up, IV treatments appropriate due to intensity of illness or inability to take PO and Inpatient level of care appropriate due to severity of illness.  Remains on IV ceftriaxone pending tissue cultures and final recommendation by ID   Dispo: The patient is from: Home              Anticipated d/c is to: Home              Anticipated d/c date is: 2 days              Patient currently is not medically stable to d/c.       Consultants:  Infectious disease IR   Sch Meds:  Scheduled Meds: . Chlorhexidine Gluconate Cloth  6 each Topical Daily  . enoxaparin (LOVENOX) injection  30 mg Subcutaneous Q24H  . feeding supplement (ENSURE ENLIVE)  237 mL Oral BID BM  . iron polysaccharides  150 mg Oral Daily  . lidocaine  1 patch Transdermal Q24H  . pantoprazole  40 mg Oral Daily  . predniSONE  40 mg Oral Q breakfast  . sodium chloride flush  10 mL Intravenous Q12H   Continuous Infusions: . sodium chloride    . sodium chloride Stopped (03/17/20 1749)  . cefTRIAXone (ROCEPHIN)  IV 2 g (03/20/20 0456)  . metronidazole 500 mg (03/20/20 0911)   PRN Meds:.sodium chloride, acetaminophen **OR** acetaminophen, cyclobenzaprine, ipratropium-albuterol, montelukast, ondansetron **OR** ondansetron (ZOFRAN) IV, oxyCODONE, polyethylene glycol  Antimicrobials: Anti-infectives (From admission, onward)   Start     Dose/Rate Route Frequency Ordered Stop   03/17/20 0445  cefTRIAXone (ROCEPHIN) 2 g in sodium chloride 0.9 % 100 mL IVPB     Discontinue     2 g 200 mL/hr over 30 Minutes Intravenous Every 24 hours 03/17/20 0442     03/16/20 2200  ceFEPIme (MAXIPIME) 2 g in sodium chloride 0.9 % 100 mL IVPB   Status:  Discontinued        2 g 200 mL/hr over 30 Minutes Intravenous Every 12 hours 03/16/20 1312 03/17/20 0442   03/16/20 2000  vancomycin (VANCOREADY) IVPB 500 mg/100 mL  Status:  Discontinued        500 mg 100 mL/hr over 60 Minutes Intravenous Every 24 hours 03/15/20 2059 03/17/20 0425   03/16/20 1800  ceFEPIme (MAXIPIME) 2 g in sodium chloride 0.9 % 100 mL IVPB  Status:  Discontinued        2 g 200 mL/hr over 30 Minutes Intravenous Every 24 hours 03/15/20 2101 03/16/20 1312   03/15/20 2300  ceFEPIme (MAXIPIME) 2 g in sodium chloride 0.9 % 100 mL IVPB        2 g 200 mL/hr over 30 Minutes Intravenous  Once 03/15/20 2258 03/16/20 0024   03/15/20 2300  metroNIDAZOLE (FLAGYL) IVPB 500 mg     Discontinue     500 mg 100 mL/hr over 60 Minutes Intravenous Every 8 hours 03/15/20 2258     03/15/20 2300  vancomycin (VANCOCIN) IVPB 1000 mg/200 mL premix  1,000 mg 200 mL/hr over 60 Minutes Intravenous  Once 03/15/20 2258 03/16/20 0059   03/15/20 1815  ceFEPIme (MAXIPIME) 2 g in sodium chloride 0.9 % 100 mL IVPB        2 g 200 mL/hr over 30 Minutes Intravenous  Once 03/15/20 1804 03/15/20 2040   03/15/20 1815  vancomycin (VANCOCIN) IVPB 1000 mg/200 mL premix        1,000 mg 200 mL/hr over 60 Minutes Intravenous  Once 03/15/20 1804 03/15/20 2040       I have personally reviewed the following labs and images: CBC: Recent Labs  Lab 03/14/20 1005 03/15/20 1748 03/16/20 0502 03/17/20 0602 03/19/20 0409  WBC 18.5* 18.8* 15.6* 10.3 8.1  NEUTROABS 16.4* 17.9*  --  8.8* 7.6  HGB 8.9* 7.4* 8.5* 8.5* 9.6*  HCT 26.7* 21.5* 26.0* 24.9* 28.8*  MCV 93.7 90.7 88.7 84.7 87.0  PLT 518* 306 262 220 176   BMP &GFR Recent Labs  Lab 03/15/20 1748 03/16/20 0502 03/17/20 0602 03/18/20 0642 03/19/20 0409  NA 129* 128* 129* 131* 131*  K 3.8 2.9* 3.5 3.3* 4.3  CL 101 99 99 101 101  CO2 19* 20* 20* 24 22  GLUCOSE 120* 112* 88 98 150*  BUN 37* 28* 29* 25* 24*  CREATININE 1.02* 0.87 1.02*  1.05* 0.72  CALCIUM 7.7* 7.8* 7.8* 7.9* 8.2*  MG  --   --  1.7 1.7 1.8  PHOS  --   --   --  2.7  --    Estimated Creatinine Clearance: 31.8 mL/min (by C-G formula based on SCr of 0.72 mg/dL). Liver & Pancreas: Recent Labs  Lab 03/14/20 1005 03/15/20 1748 03/16/20 0502  AST 28 149* 152*  ALT 14 74* 88*  ALKPHOS 77 86 66  BILITOT 0.7 0.7 1.2  PROT 6.6 5.6* 5.2*  ALBUMIN 3.6 2.8* 2.6*   No results for input(s): LIPASE, AMYLASE in the last 168 hours. No results for input(s): AMMONIA in the last 168 hours. Diabetic: No results for input(s): HGBA1C in the last 72 hours. Recent Labs  Lab 03/16/20 0324 03/18/20 0734 03/19/20 0742  GLUCAP 107* 70 105*  105*   Cardiac Enzymes: No results for input(s): CKTOTAL, CKMB, CKMBINDEX, TROPONINI in the last 168 hours. No results for input(s): PROBNP in the last 8760 hours. Coagulation Profile: Recent Labs  Lab 03/15/20 1748 03/16/20 0502 03/19/20 0409  INR 1.3* 1.4* 1.2   Thyroid Function Tests: No results for input(s): TSH, T4TOTAL, FREET4, T3FREE, THYROIDAB in the last 72 hours. Lipid Profile: No results for input(s): CHOL, HDL, LDLCALC, TRIG, CHOLHDL, LDLDIRECT in the last 72 hours. Anemia Panel: No results for input(s): VITAMINB12, FOLATE, FERRITIN, TIBC, IRON, RETICCTPCT in the last 72 hours. Urine analysis:    Component Value Date/Time   COLORURINE YELLOW (A) 03/15/2020 2042   APPEARANCEUR HAZY (A) 03/15/2020 2042   LABSPEC 1.021 03/15/2020 2042   PHURINE 5.0 03/15/2020 2042   GLUCOSEU 50 (A) 03/15/2020 2042   HGBUR MODERATE (A) 03/15/2020 2042   BILIRUBINUR NEGATIVE 03/15/2020 2042   KETONESUR NEGATIVE 03/15/2020 2042   PROTEINUR 30 (A) 03/15/2020 2042   NITRITE NEGATIVE 03/15/2020 2042   LEUKOCYTESUR NEGATIVE 03/15/2020 2042   Sepsis Labs: Invalid input(s): PROCALCITONIN, [place]  Microbiology: Recent Results (from the past 240 hour(s))  Culture, blood (Routine x 2)     Status: Abnormal   Collection Time:  03/15/20  5:48 PM   Specimen: BLOOD  Result Value Ref Range Status   Specimen Description  Final    BLOOD LARM Performed at [facility], [address], [place], [place] [address]    Special Requests   Final    BOTTLES DRAWN AEROBIC AND ANAEROBIC Blood Culture adequate volume Performed at [facility], [address] [place], [place] [address]    Culture  Setup Time   Final    IN BOTH AEROBIC AND ANAEROBIC BOTTLES NO ORGANISMS SEEN Performed at [facility], [address] [place], [place] [address]    Culture (A)  Final    STREPTOCOCCUS INTERMEDIUS SUSCEPTIBILITIES PERFORMED ON PREVIOUS CULTURE WITHIN THE LAST 5 DAYS. Performed at [facility], [place] [address], [place], [place] [address]    Report Status 03/19/2020 FINAL  Final  Culture, blood (Routine x 2)     Status: Abnormal   Collection Time: 03/15/20  5:49 PM   Specimen: BLOOD  Result Value Ref Range Status   Specimen Description   Final    BLOOD RAC Performed at [facility], [address] [place], [place] [address]    Special Requests   Final    BOTTLES DRAWN AEROBIC AND ANAEROBIC Blood Culture adequate volume Performed at [facility], [address] [place], [place] [address]    Culture  Setup Time   Final    Organism ID to follow IN BOTH AEROBIC AND ANAEROBIC BOTTLES GRAM POSITIVE COCCI CRITICAL RESULT CALLED TO, READ BACK BY AND VERIFIED WITH: DAVID BESANTI @228  03/17/2020 TTG Performed at [facility], [address] [place],  [address]    Culture STREPTOCOCCUS INTERMEDIUS (A)  Final   Report Status 03/19/2020 FINAL  Final   Organism ID, Bacteria STREPTOCOCCUS INTERMEDIUS  Final      Susceptibility   Streptococcus intermedius - MIC*    PENICILLIN <=0.06 SENSITIVE Sensitive     CEFTRIAXONE <=0.12 SENSITIVE Sensitive     ERYTHROMYCIN <=0.12 SENSITIVE Sensitive     LEVOFLOXACIN 0.5 SENSITIVE Sensitive     VANCOMYCIN 0.5 SENSITIVE  Sensitive     * STREPTOCOCCUS INTERMEDIUS  Blood Culture ID Panel (Reflexed)     Status: Abnormal   Collection Time: 03/15/20  5:49 PM  Result Value Ref Range Status   Enterococcus species NOT DETECTED NOT DETECTED Final   Listeria monocytogenes NOT DETECTED NOT DETECTED Final   Staphylococcus species NOT DETECTED NOT DETECTED Final   Staphylococcus aureus (BCID) NOT DETECTED NOT DETECTED Final   Streptococcus species DETECTED (A) NOT DETECTED Final    Comment: Not Enterococcus species, Streptococcus agalactiae, Streptococcus pyogenes, or Streptococcus pneumoniae. CRITICAL RESULT CALLED TO, READ BACK BY AND VERIFIED WITH: DAVID BESANTI @[id number]  03/17/2020 TTG    Streptococcus agalactiae NOT DETECTED NOT DETECTED Final   Streptococcus pneumoniae NOT DETECTED NOT DETECTED Final   Streptococcus pyogenes NOT DETECTED NOT DETECTED Final   Acinetobacter baumannii NOT DETECTED NOT DETECTED Final   Enterobacteriaceae species NOT DETECTED NOT DETECTED Final   Enterobacter cloacae complex NOT DETECTED NOT DETECTED Final   Escherichia coli NOT DETECTED NOT DETECTED Final   Klebsiella oxytoca NOT DETECTED NOT DETECTED Final   Klebsiella pneumoniae NOT DETECTED NOT DETECTED Final   Proteus species NOT DETECTED NOT DETECTED Final   Serratia marcescens NOT DETECTED NOT DETECTED Final   Haemophilus influenzae NOT DETECTED NOT DETECTED Final   Neisseria meningitidis NOT DETECTED NOT DETECTED Final   Pseudomonas aeruginosa NOT DETECTED NOT DETECTED Final   Candida albicans NOT DETECTED NOT DETECTED Final   Candida glabrata NOT DETECTED NOT DETECTED Final  Candida krusei NOT DETECTED NOT DETECTED Final   Candida parapsilosis NOT DETECTED NOT DETECTED Final   Candida tropicalis NOT DETECTED NOT DETECTED Final    Comment: Performed at [facility], [address] [place], [place] [address]  SARS Coronavirus 2 by RT PCR (hospital order, performed in [facility] hospital lab) Nasopharyngeal  Nasopharyngeal Swab     Status: None   Collection Time: 03/15/20  8:42 PM   Specimen: Nasopharyngeal Swab  Result Value Ref Range Status   SARS Coronavirus 2 NEGATIVE NEGATIVE Final    Comment: (NOTE) SARS-CoV-2 target nucleic acids are NOT DETECTED.  The SARS-CoV-2 RNA is generally detectable in upper and lower respiratory specimens during the acute phase of infection. The lowest concentration of SARS-CoV-2 viral copies this assay can detect is 250 copies / mL. A negative result does not preclude SARS-CoV-2 infection and should not be used as the sole basis for treatment or other patient management decisions.  A negative result may occur with improper specimen collection / handling, submission of specimen other than nasopharyngeal swab, presence of viral mutation(s) within the areas targeted by this assay, and inadequate number of viral copies (<250 copies / mL). A negative result must be combined with clinical observations, patient history, and epidemiological information.  Fact Sheet for Patients:   StrictlyIdeas.no  Fact Sheet for Healthcare Providers: BankingDealers.co.za  This test is not yet approved or  cleared by the [place] FDA and has been authorized for detection and/or diagnosis of SARS-CoV-2 by FDA under an Emergency Use Authorization (EUA).  This EUA will remain in effect (meaning this test can be used) for the duration of the COVID-19 declaration under Section 564(b)(1) of the Act, 21 U.S.C. section 360bbb-3(b)(1), unless the authorization is terminated or revoked sooner.  Performed at [facility], [address], [place], [place] [address]   Urine culture     Status: None   Collection Time: 03/15/20  8:42 PM   Specimen: Urine, Random  Result Value Ref Range Status   Specimen Description   Final    URINE, RANDOM Performed at [facility], [address], [place], [place] [address]     Special Requests   Final    NONE Performed at [facility], [address], [place], [place] [address]    Culture   Final    NO GROWTH Performed at [facility], [place] [address], [place], [place] [address]    Report Status 03/17/2020 FINAL  Final  MRSA PCR Screening     Status: None   Collection Time: 03/16/20  5:00 AM   Specimen: Nasopharyngeal  Result Value Ref Range Status   MRSA by PCR NEGATIVE NEGATIVE Final    Comment:        The GeneXpert MRSA Assay (FDA approved for NASAL specimens only), is one component of a comprehensive MRSA colonization surveillance program. It is not intended to diagnose MRSA infection nor to guide or monitor treatment for MRSA infections. Performed at [facility], [address] [place], [place] [address]   CULTURE, BLOOD (ROUTINE X 2) w Reflex to ID Panel     Status: None (Preliminary result)   Collection Time: 03/17/20  7:44 AM   Specimen: BLOOD  Result Value Ref Range Status   Specimen Description BLOOD LFA  Final   Special Requests   Final    BOTTLES DRAWN AEROBIC AND ANAEROBIC Blood Culture adequate volume   Culture   Final    NO  GROWTH 3 DAYS Performed at [facility], [address] [place], [place] [address]    Report Status PENDING  Incomplete  CULTURE, BLOOD (ROUTINE X 2) w Reflex to ID Panel     Status: None (Preliminary result)   Collection Time: 03/17/20  7:51 AM   Specimen: BLOOD  Result Value Ref Range Status   Specimen Description BLOOD RAC  Final   Special Requests   Final    BOTTLES DRAWN AEROBIC AND ANAEROBIC Blood Culture adequate volume   Culture   Final    NO GROWTH 3 DAYS Performed at [facility], [address], [place], [place] [address]    Report Status PENDING  Incomplete  Aerobic/Anaerobic Culture (surgical/deep wound)     Status: None (Preliminary result)   Collection Time: 03/19/20 11:40 AM   Specimen: Liver; Abscess  Result Value Ref Range Status    Specimen Description   Final    LIVER Performed at [facility], [address] [place], [place] [address]    Special Requests   Final    NONE Performed at [facility], [address] [place], [place] [address]    Gram Stain   Final    MODERATE WBC PRESENT, PREDOMINANTLY PMN MODERATE GRAM POSITIVE COCCI    Culture   Final    NO GROWTH < 24 HOURS Performed at [facility], [place] [address] [place], [place] [address]    Report Status PENDING  Incomplete    Radiology Studies: No results found.   45 minutes with more than 50% spent in reviewing records, counseling patient/family and coordinating care.   Statia Burdick T. Hilliard  If 7PM-7AM, please contact night-coverage [email or web address] Password [email or web address] 03/20/2020, 12:26 PM

## 2020-03-20 NOTE — Progress Notes (Signed)
[name]  [phone] [phone]  ID: Tamara Mckinney OB: 10-18-1927  MR#: [id number]  AOZ#:[id number]  Patient Care Team: [name], MD as PCP - General (Family Medicine) [name], RN as Registered Nurse Herbert Pun, MD as Consulting Physician (General Surgery) Lloyd Huger, MD as Consulting Physician (Oncology)  CHIEF COMPLAINT: Recurrent ovarian cancer, bacteremia, liver abscesses.  INTERVAL HISTORY: Patient sitting up in chair at time of evaluation.  She continues to complain of increased fatigue and poor memory, but offers no further specific complaints today.  REVIEW OF SYSTEMS:   Review of Systems  Constitutional: Positive for malaise/fatigue. Negative for fever and weight loss.  Respiratory: Negative.  Negative for cough, hemoptysis and shortness of breath.   Cardiovascular: Negative.  Negative for chest pain and leg swelling.  Gastrointestinal: Negative.  Negative for abdominal pain.  Genitourinary: Negative.  Negative for dysuria.  Musculoskeletal: Negative.  Negative for back pain.  Skin: Negative.  Negative for rash.  Neurological: Positive for weakness. Negative for dizziness, focal weakness and headaches.  Psychiatric/Behavioral: Positive for memory loss. The patient is not nervous/anxious.     As per HPI. Otherwise, a complete review of systems is negative.  PAST MEDICAL HISTORY: Past Medical History:  Diagnosis Date  . Anemia   . Arthritis   . Asthma   . Dyspnea   . GERD (gastroesophageal reflux disease)   . Hypertension   . Ovarian cancer ([place]) 2018    PAST SURGICAL HISTORY: Past Surgical History:  Procedure Laterality Date  . ABDOMINAL HYSTERECTOMY    . BREAST BIOPSY     x6  . BUNIONECTOMY Bilateral   . CATARACT EXTRACTION, BILATERAL    . FLEXIBLE SIGMOIDOSCOPY N/A 05/21/2017   Procedure: FLEXIBLE SIGMOIDOSCOPY;  Surgeon: Lin Landsman, MD;  Location: [facility] ENDOSCOPY;   Service: Gastroenterology;  Laterality: N/A;  . INCONTINENCE SURGERY    . PORTA CATH INSERTION N/A 06/16/2017   Procedure: PORTA CATH INSERTION;  Surgeon: Algernon Huxley, MD;  Location: [place] [facility] LAB;  Service: Cardiovascular;  Laterality: N/A;  . RECTAL SURGERY  04/2018  . REPAIR OF RECTAL PROLAPSE N/A 05/11/2017   Procedure: REPAIR OF RECTAL PROLAPSE;  Surgeon: Leonie Green, MD;  Location: [facility] ORS;  Service: General;  Laterality: N/A;  . TONSILLECTOMY    . VAGINA SURGERY     uncertain procedure performed    FAMILY HISTORY: Family History  Problem Relation Age of Onset  . Heart disease Mother   . Heart disease Father   . Heart disease Sister   . Heart attack Sister   . Ulcerative colitis Brother   . Lung cancer Brother   . Thyroid cancer Sister   . Asthma Sister   . Diabetes Sister   . Asthma Sister   . Pancreatic cancer Sister   . Dementia Sister   . Asthma Brother   . Heart disease Brother   . Asthma Brother   . Lung cancer Brother   . Asthma Brother   . Lung cancer Brother   . Lung cancer Brother   . Rheum arthritis Brother     ADVANCED DIRECTIVES (Y/N):  @ADVDIR @  HEALTH MAINTENANCE: Social History   Tobacco Use  . Smoking status: Never Smoker  . Smokeless tobacco: Never Used  Vaping Use  . Vaping Use: Never used  Substance Use Topics  . Alcohol use: No  . Drug use: No     Colonoscopy:  PAP:  Bone density:  Lipid panel:  No Known Allergies  Current Facility-Administered Medications  Medication Dose Route Frequency Provider Last Rate Last Admin  . 0.9 %  sodium chloride infusion   Intravenous Once Gala Romney L, MD      . 0.9 %  sodium chloride infusion   Intravenous PRN Sharen Hones, MD   Stopped at 03/17/20 1749  . acetaminophen (TYLENOL) tablet 650 mg  650 mg Oral Q6H PRN Elwyn Reach, MD   650 mg at 03/20/20 1058   Or  . acetaminophen (TYLENOL) suppository 650 mg  650 mg Rectal Q6H PRN Gala Romney L, MD      .  cefTRIAXone (ROCEPHIN) 2 g in sodium chloride 0.9 % 100 mL IVPB  2 g Intravenous Q24H Sharion Settler, NP 200 mL/hr at 03/20/20 0456 2 g at 03/20/20 0456  . Chlorhexidine Gluconate Cloth 2 % PADS 6 each  6 each Topical Daily Sharen Hones, MD   6 each at 03/20/20 [phone]  . cyclobenzaprine (FLEXERIL) tablet 10 mg  10 mg Oral TID PRN Elwyn Reach, MD   10 mg at 03/19/20 1746  . enoxaparin (LOVENOX) injection 30 mg  30 mg Subcutaneous Q24H Hallaji, Sheema M, RPH   30 mg at 03/19/20 2205  . feeding supplement (ENSURE ENLIVE) (ENSURE ENLIVE) liquid 237 mL  237 mL Oral BID BM Sharen Hones, MD   237 mL at 03/20/20 1712  . ipratropium-albuterol (DUONEB) 0.5-2.5 (3) MG/3ML nebulizer solution 3 mL  3 mL Nebulization Q2H PRN Sharion Settler, NP   3 mL at 03/16/20 0045  . iron polysaccharides (NIFEREX) capsule 150 mg  150 mg Oral Daily Sharen Hones, MD   150 mg at 03/20/20 0825  . lidocaine (LIDODERM) 5 % 1 patch  1 patch Transdermal Q24H Sharen Hones, MD   1 patch at 03/20/20 1600  . metroNIDAZOLE (FLAGYL) IVPB 500 mg  500 mg Intravenous Q8H Garba, Mohammad L, MD 100 mL/hr at 03/20/20 1800 500 mg at 03/20/20 1800  . montelukast (SINGULAIR) tablet 10 mg  10 mg Oral QHS PRN Gala Romney L, MD      . ondansetron (ZOFRAN) tablet 4 mg  4 mg Oral Q6H PRN Elwyn Reach, MD       Or  . ondansetron (ZOFRAN) injection 4 mg  4 mg Intravenous Q6H PRN Gala Romney L, MD      . oxyCODONE (Oxy IR/ROXICODONE) immediate release tablet 5 mg  5 mg Oral Q6H PRN Mercy Riding, MD   5 mg at 03/20/20 2056  . pantoprazole (PROTONIX) EC tablet 40 mg  40 mg Oral Daily Elwyn Reach, MD   40 mg at 03/20/20 0825  . polyethylene glycol (MIRALAX / GLYCOLAX) packet 17 g  17 g Oral Daily PRN Gala Romney L, MD      . predniSONE (DELTASONE) tablet 40 mg  40 mg Oral Q breakfast Sharen Hones, MD   40 mg at 03/20/20 0825  . sodium chloride flush (NS) 0.9 % injection 10 mL  10 mL Intravenous Q12H Sharen Hones, MD   10 mL at  03/20/20 0825   Facility-Administered Medications Ordered in Other Encounters  Medication Dose Route Frequency Provider Last Rate Last Admin  . sodium chloride flush (NS) 0.9 % injection 10 mL  10 mL Intravenous PRN Lloyd Huger, MD   10 mL at 03/10/18 1200    OBJECTIVE: Vitals:   03/20/20 1600 03/20/20 2113  BP: 137/70 (!) 155/85  Pulse:  73  Resp: 18 16  Temp: 98.1  F (36.7 C) 97.6 F (36.4 C)  SpO2: 98% 96%     Body mass index is 17.95 kg/m.    ECOG FS:2 - Symptomatic, <50% confined to bed  General: Thin, no acute distress. Eyes: Pink conjunctiva, anicteric sclera. HEENT: Normocephalic, moist mucous membranes. Lungs: No audible wheezing or coughing. Heart: Regular rate and rhythm. Abdomen: Soft, nontender, no obvious distention. Musculoskeletal: No edema, cyanosis, or clubbing. Neuro: Alert, answering all questions appropriately. Cranial nerves grossly intact. Skin: No rashes or petechiae noted. Psych: Normal affect.  LAB RESULTS:  Lab Results  Component Value Date   NA 131 (L) 03/19/2020   K 4.3 03/19/2020   CL 101 03/19/2020   CO2 22 03/19/2020   GLUCOSE 150 (H) 03/19/2020   BUN 24 (H) 03/19/2020   CREATININE 0.72 03/19/2020   CALCIUM 8.2 (L) 03/19/2020   PROT 5.2 (L) 03/16/2020   ALBUMIN 2.6 (L) 03/16/2020   AST 152 (H) 03/16/2020   ALT 88 (H) 03/16/2020   ALKPHOS 66 03/16/2020   BILITOT 1.2 03/16/2020   GFRNONAA >60 03/19/2020   GFRAA >60 03/19/2020    Lab Results  Component Value Date   WBC 8.1 03/19/2020   NEUTROABS 7.6 03/19/2020   HGB 9.6 (L) 03/19/2020   HCT 28.8 (L) 03/19/2020   MCV 87.0 03/19/2020   PLT 176 03/19/2020     STUDIES: US Guided Needle Placement  Result Date: 03/19/2020 INDICATION: History of ovarian cancer, enlarging hepatic lesions EXAM: ULTRASOUND CORE BIOPSY RIGHT INFERIOR HEPATIC LESION ULTRASOUND ASPIRATION RIGHT INFERIOR HEPATIC LESION (FOR CULTURE) MEDICATIONS: 1% ANESTHESIA/SEDATION: Moderate (conscious)  sedation was employed during this procedure. A total of Versed 0.5 mg and Fentanyl 50 mcg was administered intravenously. Moderate Sedation Time: 15 minutes. The patient's level of consciousness and vital signs were monitored continuously by radiology nursing throughout the procedure under my direct supervision. FLUOROSCOPY TIME:  Fluoroscopy Time: NONE. COMPLICATIONS: None immediate. PROCEDURE: Informed written consent was obtained from the patient's family after a thorough discussion of the procedural risks, benefits and alternatives. All questions were addressed. Maximal Sterile Barrier Technique was utilized including caps, mask, sterile gowns, sterile gloves, sterile drape, hand hygiene and skin antiseptic. A timeout was performed prior to the initiation of the procedure. Previous imaging reviewed. Preliminary ultrasound performed. A heterogeneous mixed echogenicity right inferior liver mass was localized and marked in the mid axillary line through a lower intercostal space. This lesion was correlated with the prior CT from 03/15/2020. Of note, the lesion is complex and echogenic and more solid by ultrasound than a fluid collection or abscess. Under sterile conditions and local anesthesia, a 17 gauge coaxial guide needle was advanced to the margin of the lesion. Needle position confirmed with ultrasound. Images obtained for documentation. Several core biopsies were obtained of the lesion. Sampling was fragmented but core biopsies were obtained. Samples placed in formalin. Also, the 17 gauge needle was utilized to perform aspiration of a more central cystic or necrotic component. Approximately 8 cc exudative bloody fluid was aspirated. This was sent for culture. Needle removed. Postprocedure imaging demonstrates no hemorrhage or hematoma. Patient tolerated the biopsy well. IMPRESSION: Successful ultrasound right inferior liver core biopsy and aspiration as above. Sample sent for surgical pathology and culture.  Electronically Signed   By: Jerilynn Mages.  Shick M.D.   On: 03/19/2020 12:57   CT ABDOMEN PELVIS W CONTRAST  Result Date: 03/15/2020 CLINICAL DATA:  Abdominal abscess or infection suspected. EXAM: CT ABDOMEN AND PELVIS WITH CONTRAST TECHNIQUE: Multidetector CT imaging of the abdomen  and pelvis was performed using the standard protocol following bolus administration of intravenous contrast. CONTRAST:  79mL OMNIPAQUE IOHEXOL 300 MG/ML  SOLN COMPARISON:  November 14, 2019 FINDINGS: Lower chest: Hiatal hernia similar to prior study. No consolidation. No pleural effusion. Hepatobiliary: New hepatic masses. (Image 19, series 2) 5.2 x 4.7 cm. This is in the RIGHT hemi liver. And RIGHT hepatic vein and portal veins can be seen passing through this area. Another lesion measuring 2.4 cm in the anterior RIGHT hemi liver (image 15, series 2) 2 additional foci in the central RIGHT liver and 2 more near the dome of the RIGHT hemi liver of similar size. These are low-density and septate. Surrounding edema in the liver is not appreciated. No significant biliary duct dilation or pericholecystic stranding. Pancreas: Pancreas is largely encased in the distal splenorenal ligament by tumor implant that is now more confluent than on the previous study. This measures approximately 7.7 x 5.0 cm as compared to 9.3 x 5.5 cm. This shows some small locules of gas within the lateral portion of the tumor on image 16 of series 2, this is of uncertain significance adjacent to the splenic flexure. Spleen: Splenic hilum infiltrated by tumor with similar appearance as described. Adrenals/Urinary Tract: RIGHT adrenal lesion unchanged. Symmetric renal enhancement. No hydronephrosis. Urinary is distended and extends below the pelvic floor. Stomach/Bowel: Postoperative changes about the rectum. Individual bowel loops are difficult to assess due to extensive tumor in the abdomen mildly dilated and filled with stool like material small-bowel loops are pushed into  the RIGHT hemiabdomen. The appendix is normal. Vascular/Lymphatic: Tortuous abdominal aorta without aneurysmal dilation with calcified atheromatous plaques similar to the previous study. No retroperitoneal lymphadenopathy. No pelvic lymphadenopathy. Reproductive: Post hysterectomy. Signs of pelvic floor dysfunction as described. Other: No free air. LEFT retroperitoneal tumor implant is similar to the prior study. Musculoskeletal: No acute bone finding. Spinal degenerative changes and scoliotic curvature of the spine with similar appearance to previous imaging. IMPRESSION: 1. New hepatic masses that are suspicious for metastatic disease with some atypical features given that vessels can be seen passing through these areas and that they are seen in the setting of tumor with peritoneal predilection, less commonly associated with hepatic parenchymal disease though this could be seen in the setting of advanced ovarian cancer. In the setting of fever, hepatic abscesses should be considered. Correlate with any recent antibiotic administration as an outpatient that may explain the lack of edema. Ultimately sampling of these areas may be helpful to differentiate. 2. Decrease in size of some areas but with gas in the area of tumor in the splenorenal ligament, this raises the question of secondary infection or gas related to necrosis. 3. Enlarging dominant lesion in the anterior abdomen with displacement of numerous bowel loops. Aortic Atherosclerosis (ICD10-I70.0). Electronically Signed   By: Zetta Bills M.D.   On: 03/15/2020 20:07   US BIOPSY (LIVER)  Result Date: 03/19/2020 INDICATION: History of ovarian cancer, enlarging hepatic lesions EXAM: ULTRASOUND CORE BIOPSY RIGHT INFERIOR HEPATIC LESION ULTRASOUND ASPIRATION RIGHT INFERIOR HEPATIC LESION (FOR CULTURE) MEDICATIONS: 1% ANESTHESIA/SEDATION: Moderate (conscious) sedation was employed during this procedure. A total of Versed 0.5 mg and Fentanyl 50 mcg was  administered intravenously. Moderate Sedation Time: 15 minutes. The patient's level of consciousness and vital signs were monitored continuously by radiology nursing throughout the procedure under my direct supervision. FLUOROSCOPY TIME:  Fluoroscopy Time: NONE. COMPLICATIONS: None immediate. PROCEDURE: Informed written consent was obtained from the patient's family after a thorough discussion  of the procedural risks, benefits and alternatives. All questions were addressed. Maximal Sterile Barrier Technique was utilized including caps, mask, sterile gowns, sterile gloves, sterile drape, hand hygiene and skin antiseptic. A timeout was performed prior to the initiation of the procedure. Previous imaging reviewed. Preliminary ultrasound performed. A heterogeneous mixed echogenicity right inferior liver mass was localized and marked in the mid axillary line through a lower intercostal space. This lesion was correlated with the prior CT from 03/15/2020. Of note, the lesion is complex and echogenic and more solid by ultrasound than a fluid collection or abscess. Under sterile conditions and local anesthesia, a 17 gauge coaxial guide needle was advanced to the margin of the lesion. Needle position confirmed with ultrasound. Images obtained for documentation. Several core biopsies were obtained of the lesion. Sampling was fragmented but core biopsies were obtained. Samples placed in formalin. Also, the 17 gauge needle was utilized to perform aspiration of a more central cystic or necrotic component. Approximately 8 cc exudative bloody fluid was aspirated. This was sent for culture. Needle removed. Postprocedure imaging demonstrates no hemorrhage or hematoma. Patient tolerated the biopsy well. IMPRESSION: Successful ultrasound right inferior liver core biopsy and aspiration as above. Sample sent for surgical pathology and culture. Electronically Signed   By: Jerilynn Mages.  Shick M.D.   On: 03/19/2020 12:57   DG Chest Portable 1  View  Result Date: 03/16/2020 CLINICAL DATA:  Shortness of breath EXAM: PORTABLE CHEST 1 VIEW COMPARISON:  March 15, 2020 FINDINGS: There is mild cardiomegaly. Aortic knob calcifications are seen. Prominence of the central pulmonary vasculature are again noted. No large airspace consolidation or pleural effusion. A right-sided MediPort catheter seen with the tip in the mid SVC. No acute osseous abnormality. A small hiatal hernia is present. IMPRESSION: Mild pulmonary vascular congestion. Electronically Signed   By: Prudencio Pair M.D.   On: 03/16/2020 01:23   DG Chest Port 1 View  Result Date: 03/15/2020 CLINICAL DATA:  Fever.  Chemotherapy yesterday. EXAM: PORTABLE CHEST 1 VIEW COMPARISON:  No prior chest imaging. FINDINGS: Right chest port remains in place. Upper normal heart size with retrocardiac hiatal hernia. No focal airspace disease. No pleural effusion or pneumothorax. No evidence of pulmonary edema. Scoliotic curvature of the spine without acute osseous abnormalities. IMPRESSION: 1. No acute chest findings. 2. Hiatal hernia. Electronically Signed   By: Keith Rake M.D.   On: 03/15/2020 19:52   DG Shoulder Left  Result Date: 03/18/2020 CLINICAL DATA:  Left shoulder pain EXAM: LEFT SHOULDER - 2+ VIEW COMPARISON:  None. FINDINGS: Alignment appears anatomic. There is no acute fracture. No substantial joint space narrowing. Mild degenerative changes at the clavicular joint. Small subacromial spur. IMPRESSION: No significant osseous abnormality. Electronically Signed   By: Macy Mis M.D.   On: 03/18/2020 15:43   ECHOCARDIOGRAM COMPLETE  Result Date: 03/17/2020    ECHOCARDIOGRAM REPORT   Patient Name:   Tamara Mckinney Date of Exam: 03/17/2020 Medical Rec #:  [id number]      Height:       61.0 in Accession #:    [id number]     Weight:       95.0 lb Date of Birth:  12/27/1927      BSA:          1.376 m Patient Age:    58 years       BP:           124/76 mmHg Patient Gender: F  HR:            90 bpm. Exam Location:  [facility] Procedure: 2D Echo Indications:     CHF-ACUTE DIASTOLIC 950.93/O67.12  History:         Patient has no prior history of Echocardiogram examinations.                  Risk Factors:Hypertension. ASTHMA.  Sonographer:     Avanell Shackleton Referring Phys:  [id number] Sharen Hones Diagnosing Phys: Kirk Ruths MD IMPRESSIONS  1. Left ventricular ejection fraction, by estimation, is 60 to 65%. The left ventricle has normal function. The left ventricle has no regional wall motion abnormalities. There is mild left ventricular hypertrophy. Left ventricular diastolic parameters are consistent with Grade I diastolic dysfunction (impaired relaxation). Elevated left atrial pressure.  2. Right ventricular systolic function is normal. The right ventricular size is normal. There is normal pulmonary artery systolic pressure.  3. Left atrial size was mildly dilated.  4. Moderate pleural effusion in the left lateral region.  5. The mitral valve is normal in structure. Trivial mitral valve regurgitation. No evidence of mitral stenosis.  6. The aortic valve was not well visualized. Aortic valve regurgitation is not visualized. No aortic stenosis is present.  7. The inferior vena cava is normal in size with greater than 50% respiratory variability, suggesting right atrial pressure of 3 mmHg. FINDINGS  Left Ventricle: Left ventricular ejection fraction, by estimation, is 60 to 65%. The left ventricle has normal function. The left ventricle has no regional wall motion abnormalities. The left ventricular internal cavity size was normal in size. There is  mild left ventricular hypertrophy. Left ventricular diastolic parameters are consistent with Grade I diastolic dysfunction (impaired relaxation). Elevated left atrial pressure. Right Ventricle: The right ventricular size is normal.Right ventricular systolic function is normal. There is normal pulmonary artery systolic pressure. The tricuspid regurgitant velocity  is 2.62 m/s, and with an assumed right atrial pressure of 3 mmHg, the estimated right ventricular systolic pressure is 33.8 mmHg. Left Atrium: Left atrial size was mildly dilated. Right Atrium: Right atrial size was normal in size. Pericardium: There is no evidence of pericardial effusion. Mitral Valve: The mitral valve is normal in structure. Normal mobility of the mitral valve leaflets. Mild mitral annular calcification. Trivial mitral valve regurgitation. No evidence of mitral valve stenosis. Tricuspid Valve: The tricuspid valve is normal in structure. Tricuspid valve regurgitation is mild . No evidence of tricuspid stenosis. Aortic Valve: The aortic valve was not well visualized. Aortic valve regurgitation is not visualized. No aortic stenosis is present. Aortic valve mean gradient measures 7.7 mmHg. Aortic valve peak gradient measures 14.0 mmHg. Aortic valve area, by VTI measures 1.30 cm. Pulmonic Valve: The pulmonic valve was not well visualized. Pulmonic valve regurgitation is not visualized. No evidence of pulmonic stenosis. Aorta: The aortic root is normal in size and structure. Venous: The inferior vena cava is normal in size with greater than 50% respiratory variability, suggesting right atrial pressure of 3 mmHg. IAS/Shunts: No atrial level shunt detected by color flow Doppler. Additional Comments: There is a moderate pleural effusion in the left lateral region.  LEFT VENTRICLE PLAX 2D LVIDd:         3.42 cm  Diastology LVIDs:         2.04 cm  LV e' lateral:   6.42 cm/s LV PW:         0.75 cm  LV E/e' lateral: 19.0 LV IVS:  1.08 cm  LV e' medial:    7.83 cm/s LVOT diam:     1.50 cm  LV E/e' medial:  15.6 LV SV:         47 LV SV Index:   34 LVOT Area:     1.77 cm  IVC IVC diam: 1.69 cm LEFT ATRIUM             Index       RIGHT ATRIUM           Index LA diam:        3.60 cm 2.62 cm/m  RA Area:     12.00 cm LA Vol (A2C):   51.2 ml 37.20 ml/m RA Volume:   23.00 ml  16.71 ml/m LA Vol (A4C):   39.7  ml 28.84 ml/m LA Biplane Vol: 46.7 ml 33.93 ml/m  AORTIC VALVE AV Area (Vmax):    1.19 cm AV Area (Vmean):   1.28 cm AV Area (VTI):     1.30 cm AV Vmax:           187.33 cm/s AV Vmean:          130.000 cm/s AV VTI:            0.359 m AV Peak Grad:      14.0 mmHg AV Mean Grad:      7.7 mmHg LVOT Vmax:         126.50 cm/s LVOT Vmean:        94.250 cm/s LVOT VTI:          0.264 m LVOT/AV VTI ratio: 0.73  AORTA Ao Root diam: 2.70 cm MITRAL VALVE                TRICUSPID VALVE MV Area (PHT): 4.10 cm     TR Peak grad:   27.5 mmHg MV Decel Time: 185 msec     TR Vmax:        262.00 cm/s MV E velocity: 122.00 cm/s MV A velocity: 157.00 cm/s  SHUNTS MV E/A ratio:  0.78         Systemic VTI:  0.26 m                             Systemic Diam: 1.50 cm Kirk Ruths MD Electronically signed by Kirk Ruths MD Signature Date/Time: 03/17/2020/3:04:49 PM    Final    US Abdomen Limited RUQ  Result Date: 03/16/2020 CLINICAL DATA:  Multiple liver lesions. Possible Mets versus abscesses. EXAM: ULTRASOUND ABDOMEN LIMITED RIGHT UPPER QUADRANT COMPARISON:  CT abdomen/pelvis 03/15/2020 FINDINGS: Gallbladder: No gallstones or wall thickening visualized. No sonographic Murphy sign noted by sonographer. Common bile duct: Diameter: 3 mm Liver: Multiple hypoechoic masses are present scattered throughout the liver. The masses appear hypoechoic but solid in nature. No definite internal fluid component or debris to suggest the presence of abscesses. The largest mass in the central aspect of the right hemi liver measures 7.3 x 5.2 x 6.9 cm. Two additional index lesions measured 2 x 1.4 x 2.2 cm and 1.9 x 1.5 x 1.6 cm respectively. Portal vein is patent on color Doppler imaging with normal direction of blood flow towards the liver. Other: Technically challenging examination secondary to labored breathing. IMPRESSION: Technically challenging examination secondary to labored breathing. The multifocal hepatic lesions appear hypoechoic but  solid in nature more consistent with hepatic metastatic disease than multifocal hepatic abscesses. Electronically Signed   By: Myrle Sheng  Laurence Ferrari M.D.   On: 03/16/2020 14:51    ASSESSMENT: Recurrent ovarian cancer, bacteremia, liver abscesses.  PLAN:    1.  Ovarian cancer: Patient last received single agent gemcitabine on Thursday, March 14, 2020.  She was scheduled for her next infusion on March 21, 2020, but this will be delayed until her underlying infection resolves.  Her most recent CA-125 continue to improve and is 146.0. 2.  Bacteremia/liver abscesses: Blood cultures positive for Streptococcus intermedius.  Gram stain of liver abscess shows gram-positive cocci and white blood cells.  Appreciate infectious disease input.  Continue current antibiotics.    Patient will require TEE to rule out endocarditis. 3.  Anemia: Chronic and unchanged.    Patient hemoglobin is mildly improved to 9.6.Appreciate consult, will follow.   Lloyd Huger, MD   03/20/2020 10:37 PM

## 2020-03-21 ENCOUNTER — Inpatient Hospital Stay: Payer: Medicare PPO

## 2020-03-21 ENCOUNTER — Inpatient Hospital Stay: Payer: Medicare PPO | Admitting: Oncology

## 2020-03-21 DIAGNOSIS — Z7189 Other specified counseling: Secondary | ICD-10-CM

## 2020-03-21 DIAGNOSIS — Z66 Do not resuscitate: Secondary | ICD-10-CM

## 2020-03-21 LAB — IRON AND TIBC
Iron: 49 ug/dL (ref 28–170)
Saturation Ratios: 25 % (ref 10.4–31.8)
TIBC: 199 ug/dL — ABNORMAL LOW (ref 250–450)
UIBC: 150 ug/dL

## 2020-03-21 LAB — RETICULOCYTES
Immature Retic Fract: 47.4 % — ABNORMAL HIGH (ref 2.3–15.9)
RBC.: 3.61 MIL/uL — ABNORMAL LOW (ref 3.87–5.11)
Retic Count, Absolute: 18.1 10*3/uL — ABNORMAL LOW (ref 19.0–186.0)
Retic Ct Pct: 0.5 % (ref 0.4–3.1)

## 2020-03-21 LAB — FOLATE: Folate: 16.4 ng/mL (ref >5.9)

## 2020-03-21 LAB — VITAMIN B12: Vitamin B-12: 1463 pg/mL — ABNORMAL HIGH (ref 180–914)

## 2020-03-21 LAB — FERRITIN: Ferritin: 628 ng/mL — ABNORMAL HIGH (ref 11–307)

## 2020-03-21 MED ORDER — IRBESARTAN 150 MG PO TABS
150.0000 mg | ORAL_TABLET | Freq: Every day | ORAL | Status: DC
  Administered 2020-03-21 – 2020-03-22 (×2): 150 mg via ORAL
  Filled 2020-03-21 (×2): qty 1

## 2020-03-21 NOTE — Progress Notes (Signed)
D/C tele monitor per Dr Cyndia Skeeters

## 2020-03-21 NOTE — Progress Notes (Signed)
PT Cancellation Note  Patient Details Name: Tamara Mckinney MRN: [id number] DOB: 12/27/1927   Cancelled Treatment:    Reason Eval/Treat Not Completed: Other (comment) (Pt and family reported a more difficult morning, originally up to chair but vomited during breakfast. Pt very sleepy in bed, awaiting TEE today. PT to re-attempt as able.)   Lieutenant Diego PT, DPT 11:05 AM,03/21/20

## 2020-03-21 NOTE — Progress Notes (Signed)
PROGRESS NOTE  CHELLIE VANLUE UYQ:[id number] DOB: Dec 03, 1927   PCP: [name], MD  Patient is from: Home.  Uses walker at baseline.  DOA: 03/15/2020 LOS: 6  Brief Narrative / Interim history: 86 year old female with history of ovarian cancer on chemotherapy, lupus anticoagulant, HTN, asthma, GERD, osteoarthritis and anemia of chronic disease presenting with fever for 1 day.  CT abdomen and pelvis concerning for new hepatic mass/abscess, and known tumors.  He was started on vancomycin, cefepime and Flagyl.  RUQ [place] did not reveal evidence of abscess.  Blood culture with Streptococcus.  ID consulted.  Antibiotics switched to Rocephin.  ID recommended liver aspiration and TEE.  Liver aspiration performed on 8/3.  Culture and pathology pending.  Repeat blood culture NGTD.  GI consulted for TEE.  Hospital course complicated by acute respiratory failure with hypoxia likely due to IV fluid resuscitation.  Received IV Lasix.  Eventually liberated of oxygen.  Subjective: Seen and examined earlier this morning.  No major events overnight of this morning.  Complaints pain over RUQ where she had liver biopsy.  No other complaints.  She denies chest pain, dyspnea, GI or UTI symptoms.  Patient and patient's son confirmed that she is DNR/DNI.  Objective: Vitals:   03/21/20 0447 03/21/20 0803 03/21/20 0940 03/21/20 1157  BP: (!) 173/97 (!) 176/111 (!) 158/87 140/84  Pulse: 72 86 94 (!) 105  Resp: 16 16  18   Temp: (!) 97.5 F (36.4 C) 98.7 F (37.1 C)  98.3 F (36.8 C)  TempSrc: Oral Oral  Oral  SpO2: 99% 99%  95%  Weight:      Height:        Intake/Output Summary (Last 24 hours) at 03/21/2020 1207 Last data filed at 03/21/2020 1000 Gross per 24 hour  Intake 400 ml  Output 1150 ml  Net -750 ml   Filed Weights   03/15/20 1733  Weight: (!) 43.1 kg    Examination:  GENERAL: Frail looking elderly female.  No distress.  Nontoxic. HEENT: MMM.  Vision and hearing grossly intact.  NECK:  Supple.  No apparent JVD.  RESP:  No IWOB.  Fair aeration bilaterally. CVS:  RRR. Heart sounds normal.  ABD/GI/GU: BS+. Abd soft, NTND.  Some tenderness over RUQ where she had a liver biopsy. MSK/EXT:  Moves extremities. No apparent deformity. No edema.  SKIN: no apparent skin lesion or wound NEURO: Awake, alert and oriented fairly.  No apparent focal neuro deficit. PSYCH: Calm. Normal affect.  Procedures:  03/19/2020-US guided liver biopsy  Microbiology summarized: 7/30-COVID-19 PCR negative. 7/30-blood culture with pansensitive Streptococcus intermedius 7/31-MRSA PCR negative. 8/1-repeat blood cultures NGTD. 80/3-liver abscess culture NGTD.  Gram stain with moderate GPC.   Assessment & Plan: Sepsis due to streptococcus bacteremia-concern about liver abscess as a source.  Liver aspiration culture NGTD but Gram stain with GPC's.  Repeat blood cultures on 8/1 NGTD.  Sepsis physiology resolved -Appreciate input by ID -Cardiology consulted for TEE -Continue IV Rocephin  Acute hypoxemic respiratory failure with hypoxia likely due to fluid overload from IV fluid resuscitation for sepsis.  Echo with normal EF, G1 DD.  Not on diuretics.  Respiratory failure resolved. -Monitor fluid status  Essential hypertension: Blood pressure elevated this morning.  On Diovan at home. -Start Avapro  Iron deficiency anemia the 8-9.  Stable. -Status post IV iron -Continue p.o. iron  Hyponatremia: SIADH?  In the setting of malignancy.  Stable. -Continue monitoring  Ovarian cancer with metastasis: On chemotherapy. -Outpatient follow-up with oncology  Asthma exacerbation/bronchospasm:  Improved after steroid -Continue prednisone and breathing treatments.  Left shoulder pain: X-ray without fracture. Right lower back pain-no tenderness over spinous processes.  No radiculopathy. -Continue Lidoderm patch and as needed analgesics.  Debility/physical  deconditioning: -OOB/PT/OT  GOC/DNR/DNI-discussed and confirmed this with patient and patient's son at bedside.  Severe malnutrition: Body mass index is 17.95 kg/m. Nutrition Problem: Severe Malnutrition Etiology: chronic illness (recurrent ovarian cancer) Signs/Symptoms: severe fat depletion, severe muscle depletion Interventions: Refer to RD note for recommendations   DVT prophylaxis:  enoxaparin (LOVENOX) injection 30 mg Start: 03/16/20 2300  Code Status: DNR/DNI Family Communication: Updated patient's son at bedside. Status is: Inpatient  Remains inpatient appropriate because:Ongoing diagnostic testing needed not appropriate for outpatient work up, IV treatments appropriate due to intensity of illness or inability to take PO and Inpatient level of care appropriate due to severity of illness.  Remains on IV ceftriaxone pending tissue cultures and final recommendation by ID   Dispo: The patient is from: Home              Anticipated d/c is to: Home              Anticipated d/c date is: 2 days              Patient currently is not medically stable to d/c.       Consultants:  Infectious disease IR Cardiology   Sch Meds:  Scheduled Meds: . Chlorhexidine Gluconate Cloth  6 each Topical Daily  . enoxaparin (LOVENOX) injection  30 mg Subcutaneous Q24H  . feeding supplement (ENSURE ENLIVE)  237 mL Oral BID BM  . irbesartan  150 mg Oral Daily  . iron polysaccharides  150 mg Oral Daily  . lidocaine  1 patch Transdermal Q24H  . pantoprazole  40 mg Oral Daily  . predniSONE  40 mg Oral Q breakfast  . sodium chloride flush  10 mL Intravenous Q12H   Continuous Infusions: . sodium chloride    . sodium chloride Stopped (03/17/20 1749)  . cefTRIAXone (ROCEPHIN)  IV 2 g (03/21/20 0503)   PRN Meds:.sodium chloride, acetaminophen **OR** acetaminophen, cyclobenzaprine, ipratropium-albuterol, montelukast, ondansetron **OR** ondansetron (ZOFRAN) IV, oxyCODONE, polyethylene  glycol  Antimicrobials: Anti-infectives (From admission, onward)   Start     Dose/Rate Route Frequency Ordered Stop   03/17/20 0445  cefTRIAXone (ROCEPHIN) 2 g in sodium chloride 0.9 % 100 mL IVPB     Discontinue     2 g 200 mL/hr over 30 Minutes Intravenous Every 24 hours 03/17/20 0442     03/16/20 2200  ceFEPIme (MAXIPIME) 2 g in sodium chloride 0.9 % 100 mL IVPB  Status:  Discontinued        2 g 200 mL/hr over 30 Minutes Intravenous Every 12 hours 03/16/20 1312 03/17/20 0442   03/16/20 2000  vancomycin (VANCOREADY) IVPB 500 mg/100 mL  Status:  Discontinued        500 mg 100 mL/hr over 60 Minutes Intravenous Every 24 hours 03/15/20 2059 03/17/20 0425   03/16/20 1800  ceFEPIme (MAXIPIME) 2 g in sodium chloride 0.9 % 100 mL IVPB  Status:  Discontinued        2 g 200 mL/hr over 30 Minutes Intravenous Every 24 hours 03/15/20 2101 03/16/20 1312   03/15/20 2300  ceFEPIme (MAXIPIME) 2 g in sodium chloride 0.9 % 100 mL IVPB        2 g 200 mL/hr over 30 Minutes Intravenous  Once 03/15/20 2258 03/16/20 0024  03/15/20 2300  metroNIDAZOLE (FLAGYL) IVPB 500 mg  Status:  Discontinued        500 mg 100 mL/hr over 60 Minutes Intravenous Every 8 hours 03/15/20 2258 03/21/20 0650   03/15/20 2300  vancomycin (VANCOCIN) IVPB 1000 mg/200 mL premix        1,000 mg 200 mL/hr over 60 Minutes Intravenous  Once 03/15/20 2258 03/16/20 0059   03/15/20 1815  ceFEPIme (MAXIPIME) 2 g in sodium chloride 0.9 % 100 mL IVPB        2 g 200 mL/hr over 30 Minutes Intravenous  Once 03/15/20 1804 03/15/20 2040   03/15/20 1815  vancomycin (VANCOCIN) IVPB 1000 mg/200 mL premix        1,000 mg 200 mL/hr over 60 Minutes Intravenous  Once 03/15/20 1804 03/15/20 2040       I have personally reviewed the following labs and images: CBC: Recent Labs  Lab 03/15/20 1748 03/16/20 0502 03/17/20 0602 03/19/20 0409  WBC 18.8* 15.6* 10.3 8.1  NEUTROABS 17.9*  --  8.8* 7.6  HGB 7.4* 8.5* 8.5* 9.6*  HCT 21.5* 26.0* 24.9*  28.8*  MCV 90.7 88.7 84.7 87.0  PLT 306 262 220 176   BMP &GFR Recent Labs  Lab 03/15/20 1748 03/16/20 0502 03/17/20 0602 03/18/20 0642 03/19/20 0409  NA 129* 128* 129* 131* 131*  K 3.8 2.9* 3.5 3.3* 4.3  CL 101 99 99 101 101  CO2 19* 20* 20* 24 22  GLUCOSE 120* 112* 88 98 150*  BUN 37* 28* 29* 25* 24*  CREATININE 1.02* 0.87 1.02* 1.05* 0.72  CALCIUM 7.7* 7.8* 7.8* 7.9* 8.2*  MG  --   --  1.7 1.7 1.8  PHOS  --   --   --  2.7  --    Estimated Creatinine Clearance: 31.8 mL/min (by C-G formula based on SCr of 0.72 mg/dL). Liver & Pancreas: Recent Labs  Lab 03/15/20 1748 03/16/20 0502  AST 149* 152*  ALT 74* 88*  ALKPHOS 86 66  BILITOT 0.7 1.2  PROT 5.6* 5.2*  ALBUMIN 2.8* 2.6*   No results for input(s): LIPASE, AMYLASE in the last 168 hours. No results for input(s): AMMONIA in the last 168 hours. Diabetic: No results for input(s): HGBA1C in the last 72 hours. Recent Labs  Lab 03/16/20 0324 03/18/20 0734 03/19/20 0742  GLUCAP 107* 70 105*  105*   Cardiac Enzymes: No results for input(s): CKTOTAL, CKMB, CKMBINDEX, TROPONINI in the last 168 hours. No results for input(s): PROBNP in the last 8760 hours. Coagulation Profile: Recent Labs  Lab 03/15/20 1748 03/16/20 0502 03/19/20 0409  INR 1.3* 1.4* 1.2   Thyroid Function Tests: No results for input(s): TSH, T4TOTAL, FREET4, T3FREE, THYROIDAB in the last 72 hours. Lipid Profile: No results for input(s): CHOL, HDL, LDLCALC, TRIG, CHOLHDL, LDLDIRECT in the last 72 hours. Anemia Panel: Recent Labs    03/21/20 0510  VITAMINB12 1,463*  FOLATE 16.4  FERRITIN 628*  TIBC 199*  IRON 49  RETICCTPCT 0.5   Urine analysis:    Component Value Date/Time   COLORURINE YELLOW (A) 03/15/2020 2042   APPEARANCEUR HAZY (A) 03/15/2020 2042   LABSPEC 1.021 03/15/2020 2042   PHURINE 5.0 03/15/2020 2042   GLUCOSEU 50 (A) 03/15/2020 2042   HGBUR MODERATE (A) 03/15/2020 2042   BILIRUBINUR NEGATIVE 03/15/2020 2042    KETONESUR NEGATIVE 03/15/2020 2042   PROTEINUR 30 (A) 03/15/2020 2042   NITRITE NEGATIVE 03/15/2020 2042   LEUKOCYTESUR NEGATIVE 03/15/2020 2042   Sepsis Labs:  Invalid input(s): PROCALCITONIN, [place]  Microbiology: Recent Results (from the past 240 hour(s))  Culture, blood (Routine x 2)     Status: Abnormal   Collection Time: 03/15/20  5:48 PM   Specimen: BLOOD  Result Value Ref Range Status   Specimen Description   Final    BLOOD LARM Performed at [facility], [address] [place], [place] [address]    Special Requests   Final    BOTTLES DRAWN AEROBIC AND ANAEROBIC Blood Culture adequate volume Performed at [facility], [address] [place], [place] [address]    Culture  Setup Time   Final    IN BOTH AEROBIC AND ANAEROBIC BOTTLES NO ORGANISMS SEEN Performed at [facility], [address], [place], [place] [address]    Culture (A)  Final    STREPTOCOCCUS INTERMEDIUS SUSCEPTIBILITIES PERFORMED ON PREVIOUS CULTURE WITHIN THE LAST 5 DAYS. Performed at [facility], [place] [address], [place], [place] [address]    Report Status 03/19/2020 FINAL  Final  Culture, blood (Routine x 2)     Status: Abnormal   Collection Time: 03/15/20  5:49 PM   Specimen: BLOOD  Result Value Ref Range Status   Specimen Description   Final    BLOOD RAC Performed at [facility], [address] [place], [place] [address]    Special Requests   Final    BOTTLES DRAWN AEROBIC AND ANAEROBIC Blood Culture adequate volume Performed at [facility], [address] [place], [place] [address]    Culture  Setup Time   Final    Organism ID to follow IN BOTH AEROBIC AND ANAEROBIC BOTTLES GRAM POSITIVE COCCI CRITICAL RESULT CALLED TO, READ BACK BY AND VERIFIED WITH: DAVID BESANTI @228  03/17/2020 TTG Performed at [facility], [address] [place], [place] [address]    Culture STREPTOCOCCUS INTERMEDIUS (A)  Final   Report  Status 03/19/2020 FINAL  Final   Organism ID, Bacteria STREPTOCOCCUS INTERMEDIUS  Final      Susceptibility   Streptococcus intermedius - MIC*    PENICILLIN <=0.06 SENSITIVE Sensitive     CEFTRIAXONE <=0.12 SENSITIVE Sensitive     ERYTHROMYCIN <=0.12 SENSITIVE Sensitive     LEVOFLOXACIN 0.5 SENSITIVE Sensitive     VANCOMYCIN 0.5 SENSITIVE Sensitive     * STREPTOCOCCUS INTERMEDIUS  Blood Culture ID Panel (Reflexed)     Status: Abnormal   Collection Time: 03/15/20  5:49 PM  Result Value Ref Range Status   Enterococcus species NOT DETECTED NOT DETECTED Final   Listeria monocytogenes NOT DETECTED NOT DETECTED Final   Staphylococcus species NOT DETECTED NOT DETECTED Final   Staphylococcus aureus (BCID) NOT DETECTED NOT DETECTED Final   Streptococcus species DETECTED (A) NOT DETECTED Final    Comment: Not Enterococcus species, Streptococcus agalactiae, Streptococcus pyogenes, or Streptococcus pneumoniae. CRITICAL RESULT CALLED TO, READ BACK BY AND VERIFIED WITH: DAVID BESANTI @[id number]  03/17/2020 TTG    Streptococcus agalactiae NOT DETECTED NOT DETECTED Final   Streptococcus pneumoniae NOT DETECTED NOT DETECTED Final   Streptococcus pyogenes NOT DETECTED NOT DETECTED Final   Acinetobacter baumannii NOT DETECTED NOT DETECTED Final   Enterobacteriaceae species NOT DETECTED NOT DETECTED Final   Enterobacter cloacae complex NOT DETECTED NOT DETECTED Final   Escherichia coli NOT DETECTED NOT DETECTED Final   Klebsiella oxytoca NOT DETECTED NOT DETECTED Final   Klebsiella pneumoniae NOT DETECTED NOT DETECTED Final   Proteus species NOT DETECTED NOT DETECTED Final   Serratia marcescens NOT DETECTED  NOT DETECTED Final   Haemophilus influenzae NOT DETECTED NOT DETECTED Final   Neisseria meningitidis NOT DETECTED NOT DETECTED Final   Pseudomonas aeruginosa NOT DETECTED NOT DETECTED Final   Candida albicans NOT DETECTED NOT DETECTED Final   Candida glabrata NOT DETECTED NOT DETECTED Final   Candida  krusei NOT DETECTED NOT DETECTED Final   Candida parapsilosis NOT DETECTED NOT DETECTED Final   Candida tropicalis NOT DETECTED NOT DETECTED Final    Comment: Performed at [facility], [address] [place], [place] [address]  SARS Coronavirus 2 by RT PCR (hospital order, performed in [place] hospital lab) Nasopharyngeal Nasopharyngeal Swab     Status: None   Collection Time: 03/15/20  8:42 PM   Specimen: Nasopharyngeal Swab  Result Value Ref Range Status   SARS Coronavirus 2 NEGATIVE NEGATIVE Final    Comment: (NOTE) SARS-CoV-2 target nucleic acids are NOT DETECTED.  The SARS-CoV-2 RNA is generally detectable in upper and lower respiratory specimens during the acute phase of infection. The lowest concentration of SARS-CoV-2 viral copies this assay can detect is 250 copies / mL. A negative result does not preclude SARS-CoV-2 infection and should not be used as the sole basis for treatment or other patient management decisions.  A negative result may occur with improper specimen collection / handling, submission of specimen other than nasopharyngeal swab, presence of viral mutation(s) within the areas targeted by this assay, and inadequate number of viral copies (<250 copies / mL). A negative result must be combined with clinical observations, patient history, and epidemiological information.  Fact Sheet for Patients:   StrictlyIdeas.no  Fact Sheet for Healthcare Providers: BankingDealers.co.za  This test is not yet approved or  cleared by the [place] FDA and has been authorized for detection and/or diagnosis of SARS-CoV-2 by FDA under an Emergency Use Authorization (EUA).  This EUA will remain in effect (meaning this test can be used) for the duration of the COVID-19 declaration under Section 564(b)(1) of the Act, 21 U.S.C. section 360bbb-3(b)(1), unless the authorization is terminated or revoked  sooner.  Performed at [facility], [address], [place], [place] [address]   Urine culture     Status: None   Collection Time: 03/15/20  8:42 PM   Specimen: Urine, Random  Result Value Ref Range Status   Specimen Description   Final    URINE, RANDOM Performed at [facility], [address] [place], [place] [address]    Special Requests   Final    NONE Performed at [facility], [address] [place], [place] [address]    Culture   Final    NO GROWTH Performed at [facility], [place] [address], [place], [place] [address]    Report Status 03/17/2020 FINAL  Final  MRSA PCR Screening     Status: None   Collection Time: 03/16/20  5:00 AM   Specimen: Nasopharyngeal  Result Value Ref Range Status   MRSA by PCR NEGATIVE NEGATIVE Final    Comment:        The GeneXpert MRSA Assay (FDA approved for NASAL specimens only), is one component of a comprehensive MRSA colonization surveillance program. It is not intended to diagnose MRSA infection nor to guide or monitor treatment for MRSA infections. Performed at [facility], [address] [place],  [address]   CULTURE, BLOOD (ROUTINE X 2) w Reflex to ID Panel     Status: None (Preliminary result)   Collection Time: 03/17/20  7:44 AM   Specimen: BLOOD  Result Value Ref Range Status   Specimen Description BLOOD LFA  Final   Special Requests   Final    BOTTLES DRAWN AEROBIC AND ANAEROBIC Blood Culture adequate volume   Culture   Final    NO GROWTH 4 DAYS Performed at [facility], [address], [place], [place] [address]    Report Status PENDING  Incomplete  CULTURE, BLOOD (ROUTINE X 2) w Reflex to ID Panel     Status: None (Preliminary result)   Collection Time: 03/17/20  7:51 AM   Specimen: BLOOD  Result Value Ref Range Status   Specimen Description BLOOD RAC  Final   Special Requests   Final    BOTTLES DRAWN AEROBIC AND ANAEROBIC Blood Culture adequate  volume   Culture   Final    NO GROWTH 4 DAYS Performed at [facility], [address] [place], [place] [address]    Report Status PENDING  Incomplete  Aerobic/Anaerobic Culture (surgical/deep wound)     Status: None (Preliminary result)   Collection Time: 03/19/20 11:40 AM   Specimen: Liver; Abscess  Result Value Ref Range Status   Specimen Description   Final    LIVER Performed at [facility], [address] [place], [place] [address]    Special Requests   Final    NONE Performed at [facility], [address] [place], [place] [address]    Gram Stain   Final    MODERATE WBC PRESENT, PREDOMINANTLY PMN MODERATE GRAM POSITIVE COCCI    Culture   Final    NO GROWTH 2 DAYS Performed at [facility], [place] [address], [place],  [address]    Report Status PENDING  Incomplete    Radiology Studies: No results found.   Gurtaj Ruz T. [place]  If 7PM-7AM, please contact night-coverage [email or web address] Password [facility] 03/21/2020, 12:07 PM

## 2020-03-21 NOTE — Care Management Important Message (Signed)
Important Message  Patient Details  Name: Tamara Mckinney MRN: [id number] Date of Birth: [birth year]   Medicare Important Message Given:  Yes     Juliann Pulse A Nishi Neiswonger 03/21/2020, 12:08 PM

## 2020-03-21 NOTE — H&P (View-Only) (Signed)
Cardiology Consultation:   Patient ID: Tamara Mckinney MRN: [id number]; DOB: 12-31-1927  Admit date: 03/15/2020 Date of Consult: 03/21/2020  Primary Care Provider: [name], MD [facility] Cardiologist: No primary care provider on file.  [place] Electrophysiologist:  None    Patient Profile:   Tamara Mckinney is a 86 y.o. female with a hx of stage III ovarian cancer on chemotherapy who is being seen today for the evaluation of TEE at the request of [place].  History of Present Illness:   Tamara Mckinney is a 86 year old female with no prior cardiac history.  She has known history of stage III ovarian cancer currently on chemotherapy, GERD, essential hypertension, lupus anticoagulant and anemia of chronic disease who presented with fatigue and intermittent fever.  She was found to have MSSA bacteremia.  CT abdomen showed new hepatic masses suspicious for metastatic disease with concern for hepatic abscess.  The patient has been treated with antibiotics.  Echocardiogram showed normal LV systolic function and no clear evidence of vegetations.  The patient had aspiration of liver lesions showing gram-positive cocci.  Due to MSSA bacteremia, a transesophageal echocardiogram was recommended by infectious diseases. The patient had no prior TEE.  She had no previous thoracic surgery or radiation therapy.  She denies any dysphagia.   Past Medical History:  Diagnosis Date  . Anemia   . Arthritis   . Asthma   . Dyspnea   . GERD (gastroesophageal reflux disease)   . Hypertension   . Ovarian cancer ([place]) 2018    Past Surgical History:  Procedure Laterality Date  . ABDOMINAL HYSTERECTOMY    . BREAST BIOPSY     x6  . BUNIONECTOMY Bilateral   . CATARACT EXTRACTION, BILATERAL    . FLEXIBLE SIGMOIDOSCOPY N/A 05/21/2017   Procedure: FLEXIBLE SIGMOIDOSCOPY;  Surgeon: Lin Landsman, MD;  Location: [facility] ENDOSCOPY;  Service: Gastroenterology;  Laterality: N/A;  . INCONTINENCE SURGERY     . PORTA CATH INSERTION N/A 06/16/2017   Procedure: PORTA CATH INSERTION;  Surgeon: Algernon Huxley, MD;  Location: [facility] CV LAB;  Service: Cardiovascular;  Laterality: N/A;  . RECTAL SURGERY  04/2018  . REPAIR OF RECTAL PROLAPSE N/A 05/11/2017   Procedure: REPAIR OF RECTAL PROLAPSE;  Surgeon: Leonie Green, MD;  Location: [facility] ORS;  Service: General;  Laterality: N/A;  . TONSILLECTOMY    . VAGINA SURGERY     uncertain procedure performed     Home Medications:  Prior to Admission medications   Medication Sig Start Date End Date Taking? Authorizing Provider  acetaminophen (TYLENOL) 500 MG tablet Take 500-1,000 mg by mouth every 6 (six) hours as needed for mild pain, moderate pain or fever.    Yes [provider]  cyclobenzaprine (FLEXERIL) 10 MG tablet TAKE ONE TABLET BY MOUTH THREE TIMES A DAY AS NEEDED Patient taking differently: Take 10 mg by mouth 3 (three) times daily as needed for muscle spasms.  12/29/19  Yes Lloyd Huger, MD  esomeprazole (NEXIUM) 40 MG capsule Take 40 mg by mouth daily as needed (GERD symptoms).    Yes [provider]  lidocaine-prilocaine (EMLA) cream APPLY A SMALL AMOUNT TO PORT SITE AT LEAST 1 HOUR PRIOR TO IT BEING ACCESSED THEN COVER WITH PLASTIC WRAP 01/12/19  Yes Lloyd Huger, MD  montelukast (SINGULAIR) 10 MG tablet Take 10 mg by mouth at bedtime as needed (allergy symptoms).    Yes [provider]  polyethylene glycol (MIRALAX / GLYCOLAX) packet Take 17  g by mouth daily as needed for mild constipation or moderate constipation.    Yes [provider]  valsartan (DIOVAN) 160 MG tablet Take 160 mg by mouth daily.    Yes [provider]    Inpatient Medications: Scheduled Meds: . Chlorhexidine Gluconate Cloth  6 each Topical Daily  . enoxaparin (LOVENOX) injection  30 mg Subcutaneous Q24H  . feeding supplement (ENSURE ENLIVE)  237 mL Oral BID BM  . irbesartan  150 mg Oral Daily  . iron  polysaccharides  150 mg Oral Daily  . lidocaine  1 patch Transdermal Q24H  . pantoprazole  40 mg Oral Daily  . predniSONE  40 mg Oral Q breakfast  . sodium chloride flush  10 mL Intravenous Q12H   Continuous Infusions: . sodium chloride    . sodium chloride Stopped (03/17/20 1749)  . cefTRIAXone (ROCEPHIN)  IV 2 g (03/21/20 0503)   PRN Meds: sodium chloride, acetaminophen **OR** acetaminophen, cyclobenzaprine, ipratropium-albuterol, montelukast, ondansetron **OR** ondansetron (ZOFRAN) IV, oxyCODONE, polyethylene glycol  Allergies:   No Known Allergies  Social History:   Social History   Socioeconomic History  . Marital status: Widowed    Spouse name: Not on file  . Number of children: Not on file  . Years of education: Not on file  . Highest education level: Not on file  Occupational History  . Not on file  Tobacco Use  . Smoking status: Never Smoker  . Smokeless tobacco: Never Used  Vaping Use  . Vaping Use: Never used  Substance and Sexual Activity  . Alcohol use: No  . Drug use: No  . Sexual activity: Never  Other Topics Concern  . Not on file  Social History Narrative  . Not on file   Social Determinants of Health   Financial Resource Strain:   . Difficulty of Paying Living Expenses:   Food Insecurity:   . Worried About Charity fundraiser in the Last Year:   . Arboriculturist in the Last Year:   Transportation Needs:   . Film/video editor (Medical):   Marland Kitchen Lack of Transportation (Non-Medical):   Physical Activity:   . Days of Exercise per Week:   . Minutes of Exercise per Session:   Stress:   . Feeling of Stress :   Social Connections:   . Frequency of Communication with Friends and Family:   . Frequency of Social Gatherings with Friends and Family:   . Attends Religious Services:   . Active Member of Clubs or Organizations:   . Attends Archivist Meetings:   Marland Kitchen Marital Status:   Intimate Partner Violence:   . Fear of Current or  Ex-Partner:   . Emotionally Abused:   Marland Kitchen Physically Abused:   . Sexually Abused:     Family History:    Family History  Problem Relation Age of Onset  . Heart disease Mother   . Heart disease Father   . Heart disease Sister   . Heart attack Sister   . Ulcerative colitis Brother   . Lung cancer Brother   . Thyroid cancer Sister   . Asthma Sister   . Diabetes Sister   . Asthma Sister   . Pancreatic cancer Sister   . Dementia Sister   . Asthma Brother   . Heart disease Brother   . Asthma Brother   . Lung cancer Brother   . Asthma Brother   . Lung cancer Brother   . Lung cancer Brother   .  Rheum arthritis Brother      ROS:  Please see the history of present illness.   All other ROS reviewed and negative.     Physical Exam/Data:   Vitals:   03/21/20 0447 03/21/20 0803 03/21/20 0940 03/21/20 1157  BP: (!) 173/97 (!) 176/111 (!) 158/87 140/84  Pulse: 72 86 94 (!) 105  Resp: 16 16  18   Temp: (!) 97.5 F (36.4 C) 98.7 F (37.1 C)  98.3 F (36.8 C)  TempSrc: Oral Oral  Oral  SpO2: 99% 99%  95%  Weight:      Height:        Intake/Output Summary (Last 24 hours) at 03/21/2020 1201 Last data filed at 03/21/2020 1000 Gross per 24 hour  Intake 400 ml  Output 1150 ml  Net -750 ml   Last 3 Weights 03/15/2020 03/14/2020 02/21/2020  Weight (lbs) 95 lb 95 lb 97 lb 4.8 oz  Weight (kg) 43.092 kg 43.092 kg 44.135 kg     Body mass index is 17.95 kg/m.  General: Elderly female in no acute distress. HEENT: normal Lymph: no adenopathy Neck: no JVD Endocrine:  No thryomegaly Vascular: No carotid bruits; FA pulses 2+ bilaterally without bruits  Cardiac:  normal S1, S2; RRR; 1 out of 6 systolic murmur at the left sternal border Lungs:  clear to auscultation bilaterally, no wheezing, rhonchi or rales  Abd: soft, nontender, no hepatomegaly  Ext: no edema Musculoskeletal:  No deformities, BUE and BLE strength normal and equal Skin: warm and dry  Neuro:  CNs 2-12 intact, no focal  abnormalities noted Psych:  Normal affect   EKG: No EKG performed   Relevant CV Studies: I personally reviewed her transthoracic echocardiogram which showed normal LV systolic function, calcified aortic valve without stenosis and no clear vegetations.  Laboratory Data:  High Sensitivity Troponin:  No results for input(s): TROPONINIHS in the last 720 hours.   Chemistry Recent Labs  Lab 03/17/20 0602 03/18/20 0642 03/19/20 0409  NA 129* 131* 131*  K 3.5 3.3* 4.3  CL 99 101 101  CO2 20* 24 22  GLUCOSE 88 98 150*  BUN 29* 25* 24*  CREATININE 1.02* 1.05* 0.72  CALCIUM 7.8* 7.9* 8.2*  GFRNONAA 48* 47* >60  GFRAA 56* 54* >60  ANIONGAP 10 6 8     Recent Labs  Lab 03/15/20 1748 03/16/20 0502  PROT 5.6* 5.2*  ALBUMIN 2.8* 2.6*  AST 149* 152*  ALT 74* 88*  ALKPHOS 86 66  BILITOT 0.7 1.2   Hematology Recent Labs  Lab 03/16/20 0502 03/16/20 0502 03/17/20 0602 03/19/20 0409 03/21/20 0510  WBC 15.6*  --  10.3 8.1  --   RBC 2.93*   < > 2.94* 3.31* 3.61*  HGB 8.5*  --  8.5* 9.6*  --   HCT 26.0*  --  24.9* 28.8*  --   MCV 88.7  --  84.7 87.0  --   MCH 29.0  --  28.9 29.0  --   MCHC 32.7  --  34.1 33.3  --   RDW 21.0*  --  21.0* 19.9*  --   PLT 262  --  220 176  --    < > = values in this interval not displayed.   BNP Recent Labs  Lab 03/15/20 1748  BNP 485.6*    DDimer No results for input(s): DDIMER in the last 168 hours.   Radiology/Studies:  US Guided Needle Placement  Result Date: 03/19/2020 INDICATION: History of ovarian cancer, enlarging hepatic  lesions EXAM: ULTRASOUND CORE BIOPSY RIGHT INFERIOR HEPATIC LESION ULTRASOUND ASPIRATION RIGHT INFERIOR HEPATIC LESION (FOR CULTURE) MEDICATIONS: 1% ANESTHESIA/SEDATION: Moderate (conscious) sedation was employed during this procedure. A total of Versed 0.5 mg and Fentanyl 50 mcg was administered intravenously. Moderate Sedation Time: 15 minutes. The patient's level of consciousness and vital signs were monitored  continuously by radiology nursing throughout the procedure under my direct supervision. FLUOROSCOPY TIME:  Fluoroscopy Time: NONE. COMPLICATIONS: None immediate. PROCEDURE: Informed written consent was obtained from the patient's family after a thorough discussion of the procedural risks, benefits and alternatives. All questions were addressed. Maximal Sterile Barrier Technique was utilized including caps, mask, sterile gowns, sterile gloves, sterile drape, hand hygiene and skin antiseptic. A timeout was performed prior to the initiation of the procedure. Previous imaging reviewed. Preliminary ultrasound performed. A heterogeneous mixed echogenicity right inferior liver mass was localized and marked in the mid axillary line through a lower intercostal space. This lesion was correlated with the prior CT from 03/15/2020. Of note, the lesion is complex and echogenic and more solid by ultrasound than a fluid collection or abscess. Under sterile conditions and local anesthesia, a 17 gauge coaxial guide needle was advanced to the margin of the lesion. Needle position confirmed with ultrasound. Images obtained for documentation. Several core biopsies were obtained of the lesion. Sampling was fragmented but core biopsies were obtained. Samples placed in formalin. Also, the 17 gauge needle was utilized to perform aspiration of a more central cystic or necrotic component. Approximately 8 cc exudative bloody fluid was aspirated. This was sent for culture. Needle removed. Postprocedure imaging demonstrates no hemorrhage or hematoma. Patient tolerated the biopsy well. IMPRESSION: Successful ultrasound right inferior liver core biopsy and aspiration as above. Sample sent for surgical pathology and culture. Electronically Signed   By: Jerilynn Mages.  Shick M.D.   On: 03/19/2020 12:57   US BIOPSY (LIVER)  Result Date: 03/19/2020 INDICATION: History of ovarian cancer, enlarging hepatic lesions EXAM: ULTRASOUND CORE BIOPSY RIGHT INFERIOR  HEPATIC LESION ULTRASOUND ASPIRATION RIGHT INFERIOR HEPATIC LESION (FOR CULTURE) MEDICATIONS: 1% ANESTHESIA/SEDATION: Moderate (conscious) sedation was employed during this procedure. A total of Versed 0.5 mg and Fentanyl 50 mcg was administered intravenously. Moderate Sedation Time: 15 minutes. The patient's level of consciousness and vital signs were monitored continuously by radiology nursing throughout the procedure under my direct supervision. FLUOROSCOPY TIME:  Fluoroscopy Time: NONE. COMPLICATIONS: None immediate. PROCEDURE: Informed written consent was obtained from the patient's family after a thorough discussion of the procedural risks, benefits and alternatives. All questions were addressed. Maximal Sterile Barrier Technique was utilized including caps, mask, sterile gowns, sterile gloves, sterile drape, hand hygiene and skin antiseptic. A timeout was performed prior to the initiation of the procedure. Previous imaging reviewed. Preliminary ultrasound performed. A heterogeneous mixed echogenicity right inferior liver mass was localized and marked in the mid axillary line through a lower intercostal space. This lesion was correlated with the prior CT from 03/15/2020. Of note, the lesion is complex and echogenic and more solid by ultrasound than a fluid collection or abscess. Under sterile conditions and local anesthesia, a 17 gauge coaxial guide needle was advanced to the margin of the lesion. Needle position confirmed with ultrasound. Images obtained for documentation. Several core biopsies were obtained of the lesion. Sampling was fragmented but core biopsies were obtained. Samples placed in formalin. Also, the 17 gauge needle was utilized to perform aspiration of a more central cystic or necrotic component. Approximately 8 cc exudative bloody fluid was aspirated. This was  sent for culture. Needle removed. Postprocedure imaging demonstrates no hemorrhage or hematoma. Patient tolerated the biopsy well.  IMPRESSION: Successful ultrasound right inferior liver core biopsy and aspiration as above. Sample sent for surgical pathology and culture. Electronically Signed   By: Jerilynn Mages.  Shick M.D.   On: 03/19/2020 12:57   DG Shoulder Left  Result Date: 03/18/2020 CLINICAL DATA:  Left shoulder pain EXAM: LEFT SHOULDER - 2+ VIEW COMPARISON:  None. FINDINGS: Alignment appears anatomic. There is no acute fracture. No substantial joint space narrowing. Mild degenerative changes at the clavicular joint. Small subacromial spur. IMPRESSION: No significant osseous abnormality. Electronically Signed   By: Macy Mis M.D.   On: 03/18/2020 15:43   ECHOCARDIOGRAM COMPLETE  Result Date: 03/17/2020    ECHOCARDIOGRAM REPORT   Patient Name:   KYLEN ISMAEL Date of Exam: 03/17/2020 Medical Rec #:  [id number]      Height:       61.0 in Accession #:    [id number]     Weight:       95.0 lb Date of Birth:  [birth year]      BSA:          1.376 m Patient Age:    46 years       BP:           124/76 mmHg Patient Gender: F              HR:           90 bpm. Exam Location:  [facility] Procedure: 2D Echo Indications:     CHF-ACUTE DIASTOLIC 295.28/U13.24  History:         Patient has no prior history of Echocardiogram examinations.                  Risk Factors:Hypertension. ASTHMA.  Sonographer:     Avanell Shackleton Referring Phys:  [id number] Sharen Hones Diagnosing Phys: Kirk Ruths MD IMPRESSIONS  1. Left ventricular ejection fraction, by estimation, is 60 to 65%. The left ventricle has normal function. The left ventricle has no regional wall motion abnormalities. There is mild left ventricular hypertrophy. Left ventricular diastolic parameters are consistent with Grade I diastolic dysfunction (impaired relaxation). Elevated left atrial pressure.  2. Right ventricular systolic function is normal. The right ventricular size is normal. There is normal pulmonary artery systolic pressure.  3. Left atrial size was mildly dilated.  4. Moderate pleural effusion  in the left lateral region.  5. The mitral valve is normal in structure. Trivial mitral valve regurgitation. No evidence of mitral stenosis.  6. The aortic valve was not well visualized. Aortic valve regurgitation is not visualized. No aortic stenosis is present.  7. The inferior vena cava is normal in size with greater than 50% respiratory variability, suggesting right atrial pressure of 3 mmHg. FINDINGS  Left Ventricle: Left ventricular ejection fraction, by estimation, is 60 to 65%. The left ventricle has normal function. The left ventricle has no regional wall motion abnormalities. The left ventricular internal cavity size was normal in size. There is  mild left ventricular hypertrophy. Left ventricular diastolic parameters are consistent with Grade I diastolic dysfunction (impaired relaxation). Elevated left atrial pressure. Right Ventricle: The right ventricular size is normal.Right ventricular systolic function is normal. There is normal pulmonary artery systolic pressure. The tricuspid regurgitant velocity is 2.62 m/s, and with an assumed right atrial pressure of 3 mmHg, the estimated right ventricular systolic pressure is 53.6 mmHg. Left Atrium: Left atrial size was mildly dilated.  Right Atrium: Right atrial size was normal in size. Pericardium: There is no evidence of pericardial effusion. Mitral Valve: The mitral valve is normal in structure. Normal mobility of the mitral valve leaflets. Mild mitral annular calcification. Trivial mitral valve regurgitation. No evidence of mitral valve stenosis. Tricuspid Valve: The tricuspid valve is normal in structure. Tricuspid valve regurgitation is mild . No evidence of tricuspid stenosis. Aortic Valve: The aortic valve was not well visualized. Aortic valve regurgitation is not visualized. No aortic stenosis is present. Aortic valve mean gradient measures 7.7 mmHg. Aortic valve peak gradient measures 14.0 mmHg. Aortic valve area, by VTI measures 1.30 cm. Pulmonic  Valve: The pulmonic valve was not well visualized. Pulmonic valve regurgitation is not visualized. No evidence of pulmonic stenosis. Aorta: The aortic root is normal in size and structure. Venous: The inferior vena cava is normal in size with greater than 50% respiratory variability, suggesting right atrial pressure of 3 mmHg. IAS/Shunts: No atrial level shunt detected by color flow Doppler. Additional Comments: There is a moderate pleural effusion in the left lateral region.  LEFT VENTRICLE PLAX 2D LVIDd:         3.42 cm  Diastology LVIDs:         2.04 cm  LV e' lateral:   6.42 cm/s LV PW:         0.75 cm  LV E/e' lateral: 19.0 LV IVS:        1.08 cm  LV e' medial:    7.83 cm/s LVOT diam:     1.50 cm  LV E/e' medial:  15.6 LV SV:         47 LV SV Index:   34 LVOT Area:     1.77 cm  IVC IVC diam: 1.69 cm LEFT ATRIUM             Index       RIGHT ATRIUM           Index LA diam:        3.60 cm 2.62 cm/m  RA Area:     12.00 cm LA Vol (A2C):   51.2 ml 37.20 ml/m RA Volume:   23.00 ml  16.71 ml/m LA Vol (A4C):   39.7 ml 28.84 ml/m LA Biplane Vol: 46.7 ml 33.93 ml/m  AORTIC VALVE AV Area (Vmax):    1.19 cm AV Area (Vmean):   1.28 cm AV Area (VTI):     1.30 cm AV Vmax:           187.33 cm/s AV Vmean:          130.000 cm/s AV VTI:            0.359 m AV Peak Grad:      14.0 mmHg AV Mean Grad:      7.7 mmHg LVOT Vmax:         126.50 cm/s LVOT Vmean:        94.250 cm/s LVOT VTI:          0.264 m LVOT/AV VTI ratio: 0.73  AORTA Ao Root diam: 2.70 cm MITRAL VALVE                TRICUSPID VALVE MV Area (PHT): 4.10 cm     TR Peak grad:   27.5 mmHg MV Decel Time: 185 msec     TR Vmax:        262.00 cm/s MV E velocity: 122.00 cm/s MV A velocity: 157.00 cm/s  SHUNTS MV E/A ratio:  0.78         Systemic VTI:  0.26 m                             Systemic Diam: 1.50 cm Kirk Ruths MD Electronically signed by Kirk Ruths MD Signature Date/Time: 03/17/2020/3:04:49 PM    Final    {  Assessment and Plan:   1. MSSA  bacteremia: Likely source is liver abscess but also need to rule out cardiac involvement.  I agree with a transesophageal echocardiogram.  The patient does not seem to have a contraindication for TEE.  I discussed the procedure in details with the patient and her son who was at the bedside.  I discussed risks and benefits and they are agreeable to proceed.  We will schedule for tomorrow with Dr. Saunders Revel.  Continue supportive care per ID and internal medicine. 2. Stage III ovarian cancer: Currently on chemotherapy.  Followed by Dr. Grayland Ormond.      For questions or updates, please contact [place] Please consult [email or web address] for contact info under    Signed, Kathlyn [place], MD  03/21/2020 12:01 PM

## 2020-03-21 NOTE — Consult Note (Signed)
Cardiology Consultation:   Patient ID: Tamara Mckinney MRN: [id number]; DOB: [birth year]  Admit date: 03/15/2020 Date of Consult: 03/21/2020  Primary Care Provider: [name], MD [facility] Cardiologist: No primary care provider on file.  [place] Electrophysiologist:  None    Patient Profile:   Tamara Mckinney is a 86 y.o. female with a hx of stage III ovarian cancer on chemotherapy who is being seen today for the evaluation of TEE at the request of [place].  History of Present Illness:   Tamara Mckinney is a 86 year old female with no prior cardiac history.  She has known history of stage III ovarian cancer currently on chemotherapy, GERD, essential hypertension, lupus anticoagulant and anemia of chronic disease who presented with fatigue and intermittent fever.  She was found to have MSSA bacteremia.  CT abdomen showed new hepatic masses suspicious for metastatic disease with concern for hepatic abscess.  The patient has been treated with antibiotics.  Echocardiogram showed normal LV systolic function and no clear evidence of vegetations.  The patient had aspiration of liver lesions showing gram-positive cocci.  Due to MSSA bacteremia, a transesophageal echocardiogram was recommended by infectious diseases. The patient had no prior TEE.  She had no previous thoracic surgery or radiation therapy.  She denies any dysphagia.   Past Medical History:  Diagnosis Date  . Anemia   . Arthritis   . Asthma   . Dyspnea   . GERD (gastroesophageal reflux disease)   . Hypertension   . Ovarian cancer ([place]) 2018    Past Surgical History:  Procedure Laterality Date  . ABDOMINAL HYSTERECTOMY    . BREAST BIOPSY     x6  . BUNIONECTOMY Bilateral   . CATARACT EXTRACTION, BILATERAL    . FLEXIBLE SIGMOIDOSCOPY N/A 05/21/2017   Procedure: FLEXIBLE SIGMOIDOSCOPY;  Surgeon: Lin Landsman, MD;  Location: [facility] ENDOSCOPY;  Service: Gastroenterology;  Laterality: N/A;  . INCONTINENCE SURGERY     . PORTA CATH INSERTION N/A 06/16/2017   Procedure: PORTA CATH INSERTION;  Surgeon: Algernon Huxley, MD;  Location: [place] CV LAB;  Service: Cardiovascular;  Laterality: N/A;  . RECTAL SURGERY  04/2018  . REPAIR OF RECTAL PROLAPSE N/A 05/11/2017   Procedure: REPAIR OF RECTAL PROLAPSE;  Surgeon: Leonie Green, MD;  Location: [facility] ORS;  Service: General;  Laterality: N/A;  . TONSILLECTOMY    . VAGINA SURGERY     uncertain procedure performed     Home Medications:  Prior to Admission medications   Medication Sig Start Date End Date Taking? Authorizing Provider  acetaminophen (TYLENOL) 500 MG tablet Take 500-1,000 mg by mouth every 6 (six) hours as needed for mild pain, moderate pain or fever.    Yes [provider]  cyclobenzaprine (FLEXERIL) 10 MG tablet TAKE ONE TABLET BY MOUTH THREE TIMES A DAY AS NEEDED Patient taking differently: Take 10 mg by mouth 3 (three) times daily as needed for muscle spasms.  12/29/19  Yes Lloyd Huger, MD  esomeprazole (NEXIUM) 40 MG capsule Take 40 mg by mouth daily as needed (GERD symptoms).    Yes [provider]  lidocaine-prilocaine (EMLA) cream APPLY A SMALL AMOUNT TO PORT SITE AT LEAST 1 HOUR PRIOR TO IT BEING ACCESSED THEN COVER WITH PLASTIC WRAP 01/12/19  Yes Lloyd Huger, MD  montelukast (SINGULAIR) 10 MG tablet Take 10 mg by mouth at bedtime as needed (allergy symptoms).    Yes [provider]  polyethylene glycol (MIRALAX / GLYCOLAX) packet Take 17  g by mouth daily as needed for mild constipation or moderate constipation.    Yes [provider]  valsartan (DIOVAN) 160 MG tablet Take 160 mg by mouth daily.    Yes [provider]    Inpatient Medications: Scheduled Meds: . Chlorhexidine Gluconate Cloth  6 each Topical Daily  . enoxaparin (LOVENOX) injection  30 mg Subcutaneous Q24H  . feeding supplement (ENSURE ENLIVE)  237 mL Oral BID BM  . irbesartan  150 mg Oral Daily  . iron  polysaccharides  150 mg Oral Daily  . lidocaine  1 patch Transdermal Q24H  . pantoprazole  40 mg Oral Daily  . predniSONE  40 mg Oral Q breakfast  . sodium chloride flush  10 mL Intravenous Q12H   Continuous Infusions: . sodium chloride    . sodium chloride Stopped (03/17/20 1749)  . cefTRIAXone (ROCEPHIN)  IV 2 g (03/21/20 0503)   PRN Meds: sodium chloride, acetaminophen **OR** acetaminophen, cyclobenzaprine, ipratropium-albuterol, montelukast, ondansetron **OR** ondansetron (ZOFRAN) IV, oxyCODONE, polyethylene glycol  Allergies:   No Known Allergies  Social History:   Social History   Socioeconomic History  . Marital status: Widowed    Spouse name: Not on file  . Number of children: Not on file  . Years of education: Not on file  . Highest education level: Not on file  Occupational History  . Not on file  Tobacco Use  . Smoking status: Never Smoker  . Smokeless tobacco: Never Used  Vaping Use  . Vaping Use: Never used  Substance and Sexual Activity  . Alcohol use: No  . Drug use: No  . Sexual activity: Never  Other Topics Concern  . Not on file  Social History Narrative  . Not on file   Social Determinants of Health   Financial Resource Strain:   . Difficulty of Paying Living Expenses:   Food Insecurity:   . Worried About Charity fundraiser in the Last Year:   . Arboriculturist in the Last Year:   Transportation Needs:   . Film/video editor (Medical):   Marland Kitchen Lack of Transportation (Non-Medical):   Physical Activity:   . Days of Exercise per Week:   . Minutes of Exercise per Session:   Stress:   . Feeling of Stress :   Social Connections:   . Frequency of Communication with Friends and Family:   . Frequency of Social Gatherings with Friends and Family:   . Attends Religious Services:   . Active Member of Clubs or Organizations:   . Attends Archivist Meetings:   Marland Kitchen Marital Status:   Intimate Partner Violence:   . Fear of Current or  Ex-Partner:   . Emotionally Abused:   Marland Kitchen Physically Abused:   . Sexually Abused:     Family History:    Family History  Problem Relation Age of Onset  . Heart disease Mother   . Heart disease Father   . Heart disease Sister   . Heart attack Sister   . Ulcerative colitis Brother   . Lung cancer Brother   . Thyroid cancer Sister   . Asthma Sister   . Diabetes Sister   . Asthma Sister   . Pancreatic cancer Sister   . Dementia Sister   . Asthma Brother   . Heart disease Brother   . Asthma Brother   . Lung cancer Brother   . Asthma Brother   . Lung cancer Brother   . Lung cancer Brother   .  Rheum arthritis Brother      ROS:  Please see the history of present illness.   All other ROS reviewed and negative.     Physical Exam/Data:   Vitals:   03/21/20 0447 03/21/20 0803 03/21/20 0940 03/21/20 1157  BP: (!) 173/97 (!) 176/111 (!) 158/87 140/84  Pulse: 72 86 94 (!) 105  Resp: 16 16  18   Temp: (!) 97.5 F (36.4 C) 98.7 F (37.1 C)  98.3 F (36.8 C)  TempSrc: Oral Oral  Oral  SpO2: 99% 99%  95%  Weight:      Height:        Intake/Output Summary (Last 24 hours) at 03/21/2020 1201 Last data filed at 03/21/2020 1000 Gross per 24 hour  Intake 400 ml  Output 1150 ml  Net -750 ml   Last 3 Weights 03/15/2020 03/14/2020 02/21/2020  Weight (lbs) 95 lb 95 lb 97 lb 4.8 oz  Weight (kg) 43.092 kg 43.092 kg 44.135 kg     Body mass index is 17.95 kg/m.  General: Elderly female in no acute distress. HEENT: normal Lymph: no adenopathy Neck: no JVD Endocrine:  No thryomegaly Vascular: No carotid bruits; FA pulses 2+ bilaterally without bruits  Cardiac:  normal S1, S2; RRR; 1 out of 6 systolic murmur at the left sternal border Lungs:  clear to auscultation bilaterally, no wheezing, rhonchi or rales  Abd: soft, nontender, no hepatomegaly  Ext: no edema Musculoskeletal:  No deformities, BUE and BLE strength normal and equal Skin: warm and dry  Neuro:  CNs 2-12 intact, no focal  abnormalities noted Psych:  Normal affect   EKG: No EKG performed   Relevant CV Studies: I personally reviewed her transthoracic echocardiogram which showed normal LV systolic function, calcified aortic valve without stenosis and no clear vegetations.  Laboratory Data:  High Sensitivity Troponin:  No results for input(s): TROPONINIHS in the last 720 hours.   Chemistry Recent Labs  Lab 03/17/20 0602 03/18/20 0642 03/19/20 0409  NA 129* 131* 131*  K 3.5 3.3* 4.3  CL 99 101 101  CO2 20* 24 22  GLUCOSE 88 98 150*  BUN 29* 25* 24*  CREATININE 1.02* 1.05* 0.72  CALCIUM 7.8* 7.9* 8.2*  GFRNONAA 48* 47* >60  GFRAA 56* 54* >60  ANIONGAP 10 6 8     Recent Labs  Lab 03/15/20 1748 03/16/20 0502  PROT 5.6* 5.2*  ALBUMIN 2.8* 2.6*  AST 149* 152*  ALT 74* 88*  ALKPHOS 86 66  BILITOT 0.7 1.2   Hematology Recent Labs  Lab 03/16/20 0502 03/16/20 0502 03/17/20 0602 03/19/20 0409 03/21/20 0510  WBC 15.6*  --  10.3 8.1  --   RBC 2.93*   < > 2.94* 3.31* 3.61*  HGB 8.5*  --  8.5* 9.6*  --   HCT 26.0*  --  24.9* 28.8*  --   MCV 88.7  --  84.7 87.0  --   MCH 29.0  --  28.9 29.0  --   MCHC 32.7  --  34.1 33.3  --   RDW 21.0*  --  21.0* 19.9*  --   PLT 262  --  220 176  --    < > = values in this interval not displayed.   BNP Recent Labs  Lab 03/15/20 1748  BNP 485.6*    DDimer No results for input(s): DDIMER in the last 168 hours.   Radiology/Studies:  US Guided Needle Placement  Result Date: 03/19/2020 INDICATION: History of ovarian cancer, enlarging hepatic  lesions EXAM: ULTRASOUND CORE BIOPSY RIGHT INFERIOR HEPATIC LESION ULTRASOUND ASPIRATION RIGHT INFERIOR HEPATIC LESION (FOR CULTURE) MEDICATIONS: 1% ANESTHESIA/SEDATION: Moderate (conscious) sedation was employed during this procedure. A total of Versed 0.5 mg and Fentanyl 50 mcg was administered intravenously. Moderate Sedation Time: 15 minutes. The patient's level of consciousness and vital signs were monitored  continuously by radiology nursing throughout the procedure under my direct supervision. FLUOROSCOPY TIME:  Fluoroscopy Time: NONE. COMPLICATIONS: None immediate. PROCEDURE: Informed written consent was obtained from the patient's family after a thorough discussion of the procedural risks, benefits and alternatives. All questions were addressed. Maximal Sterile Barrier Technique was utilized including caps, mask, sterile gowns, sterile gloves, sterile drape, hand hygiene and skin antiseptic. A timeout was performed prior to the initiation of the procedure. Previous imaging reviewed. Preliminary ultrasound performed. A heterogeneous mixed echogenicity right inferior liver mass was localized and marked in the mid axillary line through a lower intercostal space. This lesion was correlated with the prior CT from 03/15/2020. Of note, the lesion is complex and echogenic and more solid by ultrasound than a fluid collection or abscess. Under sterile conditions and local anesthesia, a 17 gauge coaxial guide needle was advanced to the margin of the lesion. Needle position confirmed with ultrasound. Images obtained for documentation. Several core biopsies were obtained of the lesion. Sampling was fragmented but core biopsies were obtained. Samples placed in formalin. Also, the 17 gauge needle was utilized to perform aspiration of a more central cystic or necrotic component. Approximately 8 cc exudative bloody fluid was aspirated. This was sent for culture. Needle removed. Postprocedure imaging demonstrates no hemorrhage or hematoma. Patient tolerated the biopsy well. IMPRESSION: Successful ultrasound right inferior liver core biopsy and aspiration as above. Sample sent for surgical pathology and culture. Electronically Signed   By: Jerilynn Mages.  Shick M.D.   On: 03/19/2020 12:57   US BIOPSY (LIVER)  Result Date: 03/19/2020 INDICATION: History of ovarian cancer, enlarging hepatic lesions EXAM: ULTRASOUND CORE BIOPSY RIGHT INFERIOR  HEPATIC LESION ULTRASOUND ASPIRATION RIGHT INFERIOR HEPATIC LESION (FOR CULTURE) MEDICATIONS: 1% ANESTHESIA/SEDATION: Moderate (conscious) sedation was employed during this procedure. A total of Versed 0.5 mg and Fentanyl 50 mcg was administered intravenously. Moderate Sedation Time: 15 minutes. The patient's level of consciousness and vital signs were monitored continuously by radiology nursing throughout the procedure under my direct supervision. FLUOROSCOPY TIME:  Fluoroscopy Time: NONE. COMPLICATIONS: None immediate. PROCEDURE: Informed written consent was obtained from the patient's family after a thorough discussion of the procedural risks, benefits and alternatives. All questions were addressed. Maximal Sterile Barrier Technique was utilized including caps, mask, sterile gowns, sterile gloves, sterile drape, hand hygiene and skin antiseptic. A timeout was performed prior to the initiation of the procedure. Previous imaging reviewed. Preliminary ultrasound performed. A heterogeneous mixed echogenicity right inferior liver mass was localized and marked in the mid axillary line through a lower intercostal space. This lesion was correlated with the prior CT from 03/15/2020. Of note, the lesion is complex and echogenic and more solid by ultrasound than a fluid collection or abscess. Under sterile conditions and local anesthesia, a 17 gauge coaxial guide needle was advanced to the margin of the lesion. Needle position confirmed with ultrasound. Images obtained for documentation. Several core biopsies were obtained of the lesion. Sampling was fragmented but core biopsies were obtained. Samples placed in formalin. Also, the 17 gauge needle was utilized to perform aspiration of a more central cystic or necrotic component. Approximately 8 cc exudative bloody fluid was aspirated. This was  sent for culture. Needle removed. Postprocedure imaging demonstrates no hemorrhage or hematoma. Patient tolerated the biopsy well.  IMPRESSION: Successful ultrasound right inferior liver core biopsy and aspiration as above. Sample sent for surgical pathology and culture. Electronically Signed   By: Jerilynn Mages.  Shick M.D.   On: 03/19/2020 12:57   DG Shoulder Left  Result Date: 03/18/2020 CLINICAL DATA:  Left shoulder pain EXAM: LEFT SHOULDER - 2+ VIEW COMPARISON:  None. FINDINGS: Alignment appears anatomic. There is no acute fracture. No substantial joint space narrowing. Mild degenerative changes at the clavicular joint. Small subacromial spur. IMPRESSION: No significant osseous abnormality. Electronically Signed   By: Macy Mis M.D.   On: 03/18/2020 15:43   ECHOCARDIOGRAM COMPLETE  Result Date: 03/17/2020    ECHOCARDIOGRAM REPORT   Patient Name:   Tamara Mckinney Date of Exam: 03/17/2020 Medical Rec #:  [id number]      Height:       61.0 in Accession #:    [id number]     Weight:       95.0 lb Date of Birth:  04-30-1928      BSA:          1.376 m Patient Age:    41 years       BP:           124/76 mmHg Patient Gender: F              HR:           90 bpm. Exam Location:  [facility] Procedure: 2D Echo Indications:     CHF-ACUTE DIASTOLIC 829.56/O13.08  History:         Patient has no prior history of Echocardiogram examinations.                  Risk Factors:Hypertension. ASTHMA.  Sonographer:     Avanell Shackleton Referring Phys:  [id number] Sharen Hones Diagnosing Phys: Kirk Ruths MD IMPRESSIONS  1. Left ventricular ejection fraction, by estimation, is 60 to 65%. The left ventricle has normal function. The left ventricle has no regional wall motion abnormalities. There is mild left ventricular hypertrophy. Left ventricular diastolic parameters are consistent with Grade I diastolic dysfunction (impaired relaxation). Elevated left atrial pressure.  2. Right ventricular systolic function is normal. The right ventricular size is normal. There is normal pulmonary artery systolic pressure.  3. Left atrial size was mildly dilated.  4. Moderate pleural effusion  in the left lateral region.  5. The mitral valve is normal in structure. Trivial mitral valve regurgitation. No evidence of mitral stenosis.  6. The aortic valve was not well visualized. Aortic valve regurgitation is not visualized. No aortic stenosis is present.  7. The inferior vena cava is normal in size with greater than 50% respiratory variability, suggesting right atrial pressure of 3 mmHg. FINDINGS  Left Ventricle: Left ventricular ejection fraction, by estimation, is 60 to 65%. The left ventricle has normal function. The left ventricle has no regional wall motion abnormalities. The left ventricular internal cavity size was normal in size. There is  mild left ventricular hypertrophy. Left ventricular diastolic parameters are consistent with Grade I diastolic dysfunction (impaired relaxation). Elevated left atrial pressure. Right Ventricle: The right ventricular size is normal.Right ventricular systolic function is normal. There is normal pulmonary artery systolic pressure. The tricuspid regurgitant velocity is 2.62 m/s, and with an assumed right atrial pressure of 3 mmHg, the estimated right ventricular systolic pressure is 62.9 mmHg. Left Atrium: Left atrial size was mildly dilated.  Right Atrium: Right atrial size was normal in size. Pericardium: There is no evidence of pericardial effusion. Mitral Valve: The mitral valve is normal in structure. Normal mobility of the mitral valve leaflets. Mild mitral annular calcification. Trivial mitral valve regurgitation. No evidence of mitral valve stenosis. Tricuspid Valve: The tricuspid valve is normal in structure. Tricuspid valve regurgitation is mild . No evidence of tricuspid stenosis. Aortic Valve: The aortic valve was not well visualized. Aortic valve regurgitation is not visualized. No aortic stenosis is present. Aortic valve mean gradient measures 7.7 mmHg. Aortic valve peak gradient measures 14.0 mmHg. Aortic valve area, by VTI measures 1.30 cm. Pulmonic  Valve: The pulmonic valve was not well visualized. Pulmonic valve regurgitation is not visualized. No evidence of pulmonic stenosis. Aorta: The aortic root is normal in size and structure. Venous: The inferior vena cava is normal in size with greater than 50% respiratory variability, suggesting right atrial pressure of 3 mmHg. IAS/Shunts: No atrial level shunt detected by color flow Doppler. Additional Comments: There is a moderate pleural effusion in the left lateral region.  LEFT VENTRICLE PLAX 2D LVIDd:         3.42 cm  Diastology LVIDs:         2.04 cm  LV e' lateral:   6.42 cm/s LV PW:         0.75 cm  LV E/e' lateral: 19.0 LV IVS:        1.08 cm  LV e' medial:    7.83 cm/s LVOT diam:     1.50 cm  LV E/e' medial:  15.6 LV SV:         47 LV SV Index:   34 LVOT Area:     1.77 cm  IVC IVC diam: 1.69 cm LEFT ATRIUM             Index       RIGHT ATRIUM           Index LA diam:        3.60 cm 2.62 cm/m  RA Area:     12.00 cm LA Vol (A2C):   51.2 ml 37.20 ml/m RA Volume:   23.00 ml  16.71 ml/m LA Vol (A4C):   39.7 ml 28.84 ml/m LA Biplane Vol: 46.7 ml 33.93 ml/m  AORTIC VALVE AV Area (Vmax):    1.19 cm AV Area (Vmean):   1.28 cm AV Area (VTI):     1.30 cm AV Vmax:           187.33 cm/s AV Vmean:          130.000 cm/s AV VTI:            0.359 m AV Peak Grad:      14.0 mmHg AV Mean Grad:      7.7 mmHg LVOT Vmax:         126.50 cm/s LVOT Vmean:        94.250 cm/s LVOT VTI:          0.264 m LVOT/AV VTI ratio: 0.73  AORTA Ao Root diam: 2.70 cm MITRAL VALVE                TRICUSPID VALVE MV Area (PHT): 4.10 cm     TR Peak grad:   27.5 mmHg MV Decel Time: 185 msec     TR Vmax:        262.00 cm/s MV E velocity: 122.00 cm/s MV A velocity: 157.00 cm/s  SHUNTS MV E/A ratio:  0.78         Systemic VTI:  0.26 m                             Systemic Diam: 1.50 cm Kirk Ruths MD Electronically signed by Kirk Ruths MD Signature Date/Time: 03/17/2020/3:04:49 PM    Final    {  Assessment and Plan:   1. MSSA  bacteremia: Likely source is liver abscess but also need to rule out cardiac involvement.  I agree with a transesophageal echocardiogram.  The patient does not seem to have a contraindication for TEE.  I discussed the procedure in details with the patient and her son who was at the bedside.  I discussed risks and benefits and they are agreeable to proceed.  We will schedule for tomorrow with Dr. Saunders Revel.  Continue supportive care per ID and internal medicine. 2. Stage III ovarian cancer: Currently on chemotherapy.  Followed by Dr. Grayland Ormond.      For questions or updates, please contact [place] Please consult [email or web address] for contact info under    Signed, Kathlyn Sacramento, MD  03/21/2020 12:01 PM

## 2020-03-22 ENCOUNTER — Inpatient Hospital Stay (HOSPITAL_COMMUNITY)
Admit: 2020-03-22 | Discharge: 2020-03-22 | Disposition: A | Discharge status: Home / Self Care | Payer: Medicare PPO | Attending: Physician Assistant | Admitting: Physician Assistant

## 2020-03-22 ENCOUNTER — Other Ambulatory Visit: Payer: Self-pay | Admitting: *Deleted

## 2020-03-22 ENCOUNTER — Encounter
Admission: EM | Disposition: A | Discharge status: Home Health | Payer: Self-pay | Source: Home / Self Care | Attending: Internal Medicine

## 2020-03-22 DIAGNOSIS — I340 Nonrheumatic mitral (valve) insufficiency: Secondary | ICD-10-CM

## 2020-03-22 DIAGNOSIS — I371 Nonrheumatic pulmonary valve insufficiency: Secondary | ICD-10-CM

## 2020-03-22 DIAGNOSIS — C569 Malignant neoplasm of unspecified ovary: Secondary | ICD-10-CM

## 2020-03-22 DIAGNOSIS — I341 Nonrheumatic mitral (valve) prolapse: Secondary | ICD-10-CM

## 2020-03-22 DIAGNOSIS — R7881 Bacteremia: Secondary | ICD-10-CM

## 2020-03-22 DIAGNOSIS — I361 Nonrheumatic tricuspid (valve) insufficiency: Secondary | ICD-10-CM

## 2020-03-22 HISTORY — PX: TEE WITHOUT CARDIOVERSION: SHX5443

## 2020-03-22 LAB — CREATININE, SERUM
Creatinine, Ser: 0.83 mg/dL (ref 0.44–1.00)
GFR calc Af Amer: >60 mL/min (ref >60)
GFR calc non Af Amer: >60 mL/min (ref >60)

## 2020-03-22 LAB — RENAL FUNCTION PANEL
Albumin: 2.2 g/dL — ABNORMAL LOW (ref 3.5–5.0)
Anion gap: 8 (ref 5–15)
BUN: 30 mg/dL — ABNORMAL HIGH (ref 8–23)
CO2: 27 mmol/L (ref 22–32)
Calcium: 8 mg/dL — ABNORMAL LOW (ref 8.9–10.3)
Chloride: 99 mmol/L (ref 98–111)
Creatinine, Ser: 0.82 mg/dL (ref 0.44–1.00)
GFR calc Af Amer: >60 mL/min (ref >60)
GFR calc non Af Amer: >60 mL/min (ref >60)
Glucose, Bld: 107 mg/dL — ABNORMAL HIGH (ref 70–99)
Phosphorus: 3.8 mg/dL (ref 2.5–4.6)
Potassium: 4.5 mmol/L (ref 3.5–5.1)
Sodium: 134 mmol/L — ABNORMAL LOW (ref 135–145)

## 2020-03-22 LAB — CULTURE, BLOOD (ROUTINE X 2)
Culture: NO GROWTH
Culture: NO GROWTH
Special Requests: ADEQUATE
Special Requests: ADEQUATE

## 2020-03-22 LAB — HEMOGLOBIN AND HEMATOCRIT, BLOOD
HCT: 25.6 % — ABNORMAL LOW (ref 36.0–46.0)
Hemoglobin: 9 g/dL — ABNORMAL LOW (ref 12.0–15.0)

## 2020-03-22 LAB — SURGICAL PATHOLOGY

## 2020-03-22 LAB — MAGNESIUM: Magnesium: 1.9 mg/dL (ref 1.7–2.4)

## 2020-03-22 SURGERY — ECHOCARDIOGRAM, TRANSESOPHAGEAL
Anesthesia: Moderate Sedation

## 2020-03-22 MED ORDER — ONDANSETRON HCL 4 MG PO TABS: 4.0000 mg | ORAL_TABLET | Freq: Four times a day (QID) | ORAL | 0 refills | Status: AC | PRN

## 2020-03-22 MED ORDER — SODIUM CHLORIDE 0.9 % IV SOLN: INTRAVENOUS | Status: DC

## 2020-03-22 MED ORDER — SENNOSIDES-DOCUSATE SODIUM 8.6-50 MG PO TABS: 1.0000 | ORAL_TABLET | Freq: Two times a day (BID) | ORAL | Status: DC | PRN

## 2020-03-22 MED ORDER — ENSURE ENLIVE PO LIQD: 237.0000 mL | Freq: Two times a day (BID) | ORAL | 12 refills | Status: AC

## 2020-03-22 MED ORDER — HEPARIN SOD (PORK) LOCK FLUSH 10 UNIT/ML IV SOLN
10.0000 [IU] | Freq: Once | INTRAVENOUS | Status: AC
  Administered 2020-03-22: 10 [IU]
  Filled 2020-03-22: qty 1

## 2020-03-22 MED ORDER — SODIUM CHLORIDE FLUSH 0.9 % IV SOLN
INTRAVENOUS | Status: AC
  Filled 2020-03-22: qty 10

## 2020-03-22 MED ORDER — CEFTRIAXONE IV (FOR PTA / DISCHARGE USE ONLY): 2.0000 g | INTRAVENOUS | 0 refills | Status: AC

## 2020-03-22 MED ORDER — POLYSACCHARIDE IRON COMPLEX 150 MG PO CAPS: 150.0000 mg | ORAL_CAPSULE | Freq: Every day | ORAL | 1 refills | Status: AC

## 2020-03-22 MED ORDER — MIDAZOLAM HCL 5 MG/5ML IJ SOLN
INTRAMUSCULAR | Status: AC | PRN
  Administered 2020-03-22 (×3): 1 mg via INTRAVENOUS

## 2020-03-22 MED ORDER — FENTANYL CITRATE (PF) 100 MCG/2ML IJ SOLN
INTRAMUSCULAR | Status: AC | PRN
  Administered 2020-03-22 (×2): 25 ug via INTRAVENOUS

## 2020-03-22 MED ORDER — BUTAMBEN-TETRACAINE-BENZOCAINE 2-2-14 % EX AERO
INHALATION_SPRAY | CUTANEOUS | Status: AC
  Filled 2020-03-22: qty 5

## 2020-03-22 MED ORDER — FENTANYL CITRATE (PF) 100 MCG/2ML IJ SOLN
INTRAMUSCULAR | Status: AC
  Filled 2020-03-22: qty 2

## 2020-03-22 MED ORDER — MIDAZOLAM HCL 5 MG/5ML IJ SOLN
INTRAMUSCULAR | Status: AC
  Filled 2020-03-22: qty 5

## 2020-03-22 MED ORDER — LIDOCAINE VISCOUS HCL 2 % MT SOLN
OROMUCOSAL | Status: AC
  Filled 2020-03-22: qty 15

## 2020-03-22 NOTE — Progress Notes (Signed)
*  PRELIMINARY RESULTS* Echocardiogram Echocardiogram Transesophageal has been performed.  Sherrie Sport 03/22/2020, 8:51 AM

## 2020-03-22 NOTE — CV Procedure (Signed)
    Transesophageal Echocardiogram Note  ALISYN LEQUIRE [id number] 1928/03/28  Procedure: Transesophageal Echocardiogram Indications: MSSA bacteremia  Procedure Details Consent: Obtained Time Out: Verified patient identification, verified procedure, site/side was marked, verified correct patient position, special equipment/implants available, Radiology Safety Procedures followed,  medications/allergies/relevent history reviewed, required imaging and test results available.  Performed  Medications:  During this procedure the patient is administered a total of Versed 3 mg and Fentanyl 50 mcg  to achieve and maintain moderate conscious sedation.  The patient's heart rate, blood pressure, and oxygen saturation are monitored continuously during the procedure. The period of conscious sedation is 15 minutes, of which I was present face-to-face 100% of this time.  Left Ventrical:  Normal  Mitral Valve: Degenerative with mild thickening, mild MAC, and mild to moderate regurgitation.  No vegetation.  Aortic Valve: Trileaflet.  Mildly thickened.  Trace regurgitation.  No vegetation.  Tricuspid Valve: Normal with mild regurgitation.  No vegetation.  Pulmonic Valve: Normal with mild regurgitation.  Left Atrium/ Left atrial appendage: No thrombus.  Atrial septum: Intact by color Doppler.  Aorta: Grossly normal.   Complications: No apparent complications Patient did tolerate procedure well.   Nelva Bush, MD 03/22/2020, 8:46 AM

## 2020-03-22 NOTE — Interval H&P Note (Signed)
History and Physical Interval Note:  03/22/2020 8:11 AM  Tamara Mckinney  has presented today for surgery, with the diagnosis of bacteremia.  The various methods of treatment have been discussed with the patient and family. After consideration of risks, benefits and other options for treatment, the patient has consented to  Procedure(s): TRANSESOPHAGEAL ECHOCARDIOGRAM (TEE) (N/A) as a surgical intervention.  The patient's history has been reviewed, patient examined, no change in status, stable for surgery.  I have reviewed the patient's chart and labs.  Questions were answered to the patient's satisfaction.     Linus Weckerly

## 2020-03-22 NOTE — Discharge Summary (Signed)
Physician Discharge Summary  Tamara Mckinney QAS:[id number] DOB: July 11, 1928 DOA: 03/15/2020  PCP: [name], MD  Admit date: 03/15/2020 Discharge date: 03/22/2020  Admitted From: Home Disposition: Home  Recommendations for Outpatient Follow-up:  1. Follow ups as below. 2. Please obtain CBC/BMP/Mag at follow up 3. Please follow up on the following pending results: None  Home Health: PT/OT.  [facility] RN for IV infusion Equipment/Devices: 3 in 1 commode Discharge Condition: Stable CODE STATUS: DNR/DNI   Hospital Course: 86 year old female with history of ovarian cancer on chemotherapy, lupus anticoagulant, HTN, asthma, GERD, osteoarthritis and anemia of chronic disease presenting with fever for 1 day.  CT abdomen and pelvis concerning for new hepatic mass/abscess, and known tumors.  He was started on vancomycin, cefepime and Flagyl.  RUQ [place] did not reveal evidence of abscess.  Blood culture with Streptococcus.  ID consulted.  Antibiotics switched to Rocephin.  ID recommended liver aspiration and TEE.  Liver aspiration performed on 8/3.    Tissue Gram stain with GPC's.  Tissue culture NGTD.  Pathology with necrosis and cholangitis but no neoplasm.  Repeat blood culture NGTD.    TEE without vegetation.   Cleared for discharge on IV ceftriaxone for a total of 4 weeks.  ID follow-up arranged.   Hospital course complicated by acute respiratory failure with hypoxia likely due to IV fluid resuscitation. Received IV Lasix.  Eventually liberated of oxygen and remained stable.  Evaluated by therapy who recommended home health PT/OT and 3 in 1 commode.  See individual problem list below for more hospital course.  Discharge Diagnoses:  Sepsis due to streptococcus bacteremia-concern about liver abscess as a source.  Liver aspiration culture NGTD but Gram stain with GPC's.  Liver pathology with necrosis and cholangitis. Repeat blood cultures on 8/1 NGTD.  TTE without vegetation. Sepsis physiology  resolved -Discharged on ceftriaxone for 4 weeks (through 04/13/2020) per ID recommendation. -Outpatient follow-up with ID on 04/09/2020 at 9:30 AM.  Acute hypoxemic respiratory failure with hypoxia likely due to fluid overload from IV fluid resuscitation for sepsis.  Echo with normal EF, G1 DD.  Not on diuretics.  Respiratory failure resolved.  Essential hypertension: Blood pressure elevated this morning.  On Diovan at home. -Discharged on home medications.  Iron deficiency anemia the 8-9.  Stable. -Continue p.o. iron with bowel regimen.  Hyponatremia: SIADH?  In the setting of malignancy.  Stable. -Recheck BMP at follow-up.  Ovarian cancer with metastasis: On chemotherapy. -Outpatient follow-up with oncology  Asthma exacerbation/bronchospasm:  Improved after steroid -Completed treatment course with prednisone  Left shoulder pain: X-ray without fracture. Right lower back pain-no tenderness over spinous processes.  No radiculopathy. -As needed pain medications  Debility/physical deconditioning: -Home health PT/OT  GOC/DNR/DNI-discussed and confirmed this with patient and patient's son at bedside.  Severe malnutrition: -Ensure Enlive Body mass index is 17.95 kg/m. Nutrition Problem: Severe Malnutrition Etiology: chronic illness (recurrent ovarian cancer) Signs/Symptoms: severe fat depletion, severe muscle depletion Interventions: Refer to RD note for recommendations      Discharge Exam: Vitals:   03/22/20 0947 03/22/20 1222  BP: (!) 147/72 (!) 151/78  Pulse: 68 74  Resp: 18 16  Temp: (!) 97.5 F (36.4 C) (!) 97.5 F (36.4 C)  SpO2: 98% 99%    GENERAL: Frail looking elderly female.  Nontoxic. HEENT: MMM.  Vision and hearing grossly intact.  NECK: Supple.  No apparent JVD.  RESP: On RA.  No IWOB.  Fair aeration bilaterally. CVS:  RRR. Heart sounds normal.  ABD/GI/GU:  Bowel sounds present. Soft. Non tender.  MSK/EXT:  Moves extremities. No apparent  deformity. No edema.  SKIN: no apparent skin lesion or wound NEURO: Awake, alert and oriented appropriately.  No apparent focal neuro deficit. PSYCH: Calm. Normal affect.   Discharge Instructions  Discharge Instructions    [facility] pharmacist to adjust dose for Vancomycin, Aminoglycosides and other anti-infective therapies as requested by physician.   Complete by: As directed    [facility] to provide Cath Flo 69m   Complete by: As directed    Administer for PICC line occlusion and as ordered by physician for other access device issues.   Anaphylaxis Kit: Provided to treat any anaphylactic reaction to the medication being provided to the patient if First Dose or when requested by physician   Complete by: As directed    Epinephrine 179mml vial / amp: Administer 0.24m36m0.24ml80mubcutaneously once for moderate to severe anaphylaxis, nurse to call physician and pharmacy when reaction occurs and call 911 if needed for immediate care   Diphenhydramine 50mg224mIV vial: Administer 25-50mg 40mM PRN for first dose reaction, rash, itching, mild reaction, nurse to call physician and pharmacy when reaction occurs   Sodium Chloride 0.9% NS 500ml I68mdminister if needed for hypovolemic blood pressure drop or as ordered by physician after call to physician with anaphylactic reaction   Call MD for:  difficulty breathing, headache or visual disturbances   Complete by: As directed    Call MD for:  persistant dizziness or light-headedness   Complete by: As directed    Call MD for:  persistant nausea and vomiting   Complete by: As directed    Call MD for:  severe uncontrolled pain   Complete by: As directed    Call MD for:  temperature >100.4   Complete by: As directed    Change dressing on IV access line weekly and PRN   Complete by: As directed    Diet - low sodium heart healthy   Complete by: As directed    Discharge instructions   Complete by: As directed    It has been a  pleasure taking care of you!  You were hospitalized and treated for blood and liver infection.  You were treated with IV antibiotics and your symptoms improved.  We are discharging you more IV antibiotics to complete treatment course.    We may have started you on other new medications or made some changes to your home medications during this hospitalization. Please review your new medication list and the directions carefully before you take them.   Please go to your hospital follow-up appointments or call to reschedule as recommended.   Take care,   Flush IV access with Sodium Chloride 0.9% and Heparin 10 units/ml or 100 units/ml   Complete by: As directed    Home infusion instructions - [facility]   Complete by: As directed    Instructions: Flush IV access with Sodium Chloride 0.9% and Heparin 10units/ml or 100units/ml   Change dressing on IV access line: Weekly and PRN   Instructions Cath Flo 2mg: Ad824mister for PICC Line occlusion and as ordered by physician for other access device   [facility] pharmacist to adjust dose for: Vancomycin, Aminoglycosides and other anti-infective therapies as requested by physician   Increase activity slowly   Complete by: As directed    Method of administration may be changed at the discretion of home infusion pharmacist based upon assessment of the patient  and/or caregiver's ability to self-administer the medication ordered   Complete by: As directed    No wound care   Complete by: As directed      Allergies as of 03/22/2020   No Known Allergies     Medication List    STOP taking these medications   esomeprazole 40 MG capsule Commonly known as: [place] these medications   acetaminophen 500 MG tablet Commonly known as: TYLENOL Take 500-1,000 mg by mouth every 6 (six) hours as needed for mild pain, moderate pain or fever.   cefTRIAXone  IVPB Commonly known as: ROCEPHIN Inject 2 g into the vein daily for 21  days. Indication: S. Intermedius bacteremia and liver abscess First Dose: Yes Last Day of Therapy:  04/13/2020 Labs - Once weekly:  CBC/D and CMP Method of administration: IV Push Method of administration may be changed at the discretion of home infusion pharmacist based upon assessment of the patient and/or caregiver's ability to self-administer the medication ordered. Start taking on: March 23, 2020   cyclobenzaprine 10 MG tablet Commonly known as: FLEXERIL TAKE ONE TABLET BY MOUTH THREE TIMES A DAY AS NEEDED What changed: reasons to take this   feeding supplement (ENSURE ENLIVE) Liqd Take 237 mLs by mouth 2 (two) times daily between meals. Start taking on: March 23, 2020   iron polysaccharides 150 MG capsule Commonly known as: NIFEREX Take 1 capsule (150 mg total) by mouth daily. Start taking on: March 23, 2020   lidocaine-prilocaine cream Commonly known as: EMLA APPLY A SMALL AMOUNT TO PORT SITE AT LEAST 1 HOUR PRIOR TO IT BEING ACCESSED THEN COVER WITH PLASTIC WRAP   montelukast 10 MG tablet Commonly known as: SINGULAIR Take 10 mg by mouth at bedtime as needed (allergy symptoms).   ondansetron 4 MG tablet Commonly known as: ZOFRAN Take 1 tablet (4 mg total) by mouth every 6 (six) hours as needed for nausea.   polyethylene glycol 17 g packet Commonly known as: MIRALAX / GLYCOLAX Take 17 g by mouth daily as needed for mild constipation or moderate constipation.   valsartan 160 MG tablet Commonly known as: DIOVAN Take 160 mg by mouth daily.            Durable Medical Equipment  (From admission, onward)         Start     Ordered   03/22/20 1419  For home use only DME Bedside commode  Once       Comments: 3 in 1 commode  Question:  Patient needs a bedside commode to treat with the following condition  Answer:  Generalized weakness   03/22/20 1419           Discharge Care Instructions  (From admission, onward)         Start     Ordered   03/22/20 0000   Change dressing on IV access line weekly and PRN  (Home infusion instructions - [facility] )        03/22/20 1444          Consultations:  Infectious disease  Oncology  IR  Procedures/Studies:  03/19/2020-ultrasound guided liver biopsy  03/22/2020 TEE  1. Left ventricular ejection fraction, by estimation, is 55 to 60%. The  left ventricle has normal function. The left ventricle has no regional  wall motion abnormalities.  2. Right ventricular systolic function is normal. The right ventricular  size is normal.  3. No left atrial/left atrial appendage thrombus was  detected.  4. The mitral valve is degenerative. Mild to moderate mitral valve  regurgitation.  5. The aortic valve is tricuspid. Aortic valve regurgitation is trivial.  6. Mildly dilated pulmonary artery.    US Guided Needle Placement  Result Date: 03/19/2020 INDICATION: History of ovarian cancer, enlarging hepatic lesions EXAM: ULTRASOUND CORE BIOPSY RIGHT INFERIOR HEPATIC LESION ULTRASOUND ASPIRATION RIGHT INFERIOR HEPATIC LESION (FOR CULTURE) MEDICATIONS: 1% ANESTHESIA/SEDATION: Moderate (conscious) sedation was employed during this procedure. A total of Versed 0.5 mg and Fentanyl 50 mcg was administered intravenously. Moderate Sedation Time: 15 minutes. The patient's level of consciousness and vital signs were monitored continuously by radiology nursing throughout the procedure under my direct supervision. FLUOROSCOPY TIME:  Fluoroscopy Time: NONE. COMPLICATIONS: None immediate. PROCEDURE: Informed written consent was obtained from the patient's family after a thorough discussion of the procedural risks, benefits and alternatives. All questions were addressed. Maximal Sterile Barrier Technique was utilized including caps, mask, sterile gowns, sterile gloves, sterile drape, hand hygiene and skin antiseptic. A timeout was performed prior to the initiation of the procedure. Previous imaging reviewed.  Preliminary ultrasound performed. A heterogeneous mixed echogenicity right inferior liver mass was localized and marked in the mid axillary line through a lower intercostal space. This lesion was correlated with the prior CT from 03/15/2020. Of note, the lesion is complex and echogenic and more solid by ultrasound than a fluid collection or abscess. Under sterile conditions and local anesthesia, a 17 gauge coaxial guide needle was advanced to the margin of the lesion. Needle position confirmed with ultrasound. Images obtained for documentation. Several core biopsies were obtained of the lesion. Sampling was fragmented but core biopsies were obtained. Samples placed in formalin. Also, the 17 gauge needle was utilized to perform aspiration of a more central cystic or necrotic component. Approximately 8 cc exudative bloody fluid was aspirated. This was sent for culture. Needle removed. Postprocedure imaging demonstrates no hemorrhage or hematoma. Patient tolerated the biopsy well. IMPRESSION: Successful ultrasound right inferior liver core biopsy and aspiration as above. Sample sent for surgical pathology and culture. Electronically Signed   By: Jerilynn Mages.  Shick M.D.   On: 03/19/2020 12:57   CT ABDOMEN PELVIS W CONTRAST  Result Date: 03/15/2020 CLINICAL DATA:  Abdominal abscess or infection suspected. EXAM: CT ABDOMEN AND PELVIS WITH CONTRAST TECHNIQUE: Multidetector CT imaging of the abdomen and pelvis was performed using the standard protocol following bolus administration of intravenous contrast. CONTRAST:  67m OMNIPAQUE IOHEXOL 300 MG/ML  SOLN COMPARISON:  November 14, 2019 FINDINGS: Lower chest: Hiatal hernia similar to prior study. No consolidation. No pleural effusion. Hepatobiliary: New hepatic masses. (Image 19, series 2) 5.2 x 4.7 cm. This is in the RIGHT hemi liver. And RIGHT hepatic vein and portal veins can be seen passing through this area. Another lesion measuring 2.4 cm in the anterior RIGHT hemi liver  (image 15, series 2) 2 additional foci in the central RIGHT liver and 2 more near the dome of the RIGHT hemi liver of similar size. These are low-density and septate. Surrounding edema in the liver is not appreciated. No significant biliary duct dilation or pericholecystic stranding. Pancreas: Pancreas is largely encased in the distal splenorenal ligament by tumor implant that is now more confluent than on the previous study. This measures approximately 7.7 x 5.0 cm as compared to 9.3 x 5.5 cm. This shows some small locules of gas within the lateral portion of the tumor on image 16 of series 2, this is of uncertain significance adjacent to the  splenic flexure. Spleen: Splenic hilum infiltrated by tumor with similar appearance as described. Adrenals/Urinary Tract: RIGHT adrenal lesion unchanged. Symmetric renal enhancement. No hydronephrosis. Urinary is distended and extends below the pelvic floor. Stomach/Bowel: Postoperative changes about the rectum. Individual bowel loops are difficult to assess due to extensive tumor in the abdomen mildly dilated and filled with stool like material small-bowel loops are pushed into the RIGHT hemiabdomen. The appendix is normal. Vascular/Lymphatic: Tortuous abdominal aorta without aneurysmal dilation with calcified atheromatous plaques similar to the previous study. No retroperitoneal lymphadenopathy. No pelvic lymphadenopathy. Reproductive: Post hysterectomy. Signs of pelvic floor dysfunction as described. Other: No free air. LEFT retroperitoneal tumor implant is similar to the prior study. Musculoskeletal: No acute bone finding. Spinal degenerative changes and scoliotic curvature of the spine with similar appearance to previous imaging. IMPRESSION: 1. New hepatic masses that are suspicious for metastatic disease with some atypical features given that vessels can be seen passing through these areas and that they are seen in the setting of tumor with peritoneal predilection, less  commonly associated with hepatic parenchymal disease though this could be seen in the setting of advanced ovarian cancer. In the setting of fever, hepatic abscesses should be considered. Correlate with any recent antibiotic administration as an outpatient that may explain the lack of edema. Ultimately sampling of these areas may be helpful to differentiate. 2. Decrease in size of some areas but with gas in the area of tumor in the splenorenal ligament, this raises the question of secondary infection or gas related to necrosis. 3. Enlarging dominant lesion in the anterior abdomen with displacement of numerous bowel loops. Aortic Atherosclerosis (ICD10-I70.0). Electronically Signed   By: Zetta Bills M.D.   On: 03/15/2020 20:07   US BIOPSY (LIVER)  Result Date: 03/19/2020 INDICATION: History of ovarian cancer, enlarging hepatic lesions EXAM: ULTRASOUND CORE BIOPSY RIGHT INFERIOR HEPATIC LESION ULTRASOUND ASPIRATION RIGHT INFERIOR HEPATIC LESION (FOR CULTURE) MEDICATIONS: 1% ANESTHESIA/SEDATION: Moderate (conscious) sedation was employed during this procedure. A total of Versed 0.5 mg and Fentanyl 50 mcg was administered intravenously. Moderate Sedation Time: 15 minutes. The patient's level of consciousness and vital signs were monitored continuously by radiology nursing throughout the procedure under my direct supervision. FLUOROSCOPY TIME:  Fluoroscopy Time: NONE. COMPLICATIONS: None immediate. PROCEDURE: Informed written consent was obtained from the patient's family after a thorough discussion of the procedural risks, benefits and alternatives. All questions were addressed. Maximal Sterile Barrier Technique was utilized including caps, mask, sterile gowns, sterile gloves, sterile drape, hand hygiene and skin antiseptic. A timeout was performed prior to the initiation of the procedure. Previous imaging reviewed. Preliminary ultrasound performed. A heterogeneous mixed echogenicity right inferior liver mass was  localized and marked in the mid axillary line through a lower intercostal space. This lesion was correlated with the prior CT from 03/15/2020. Of note, the lesion is complex and echogenic and more solid by ultrasound than a fluid collection or abscess. Under sterile conditions and local anesthesia, a 17 gauge coaxial guide needle was advanced to the margin of the lesion. Needle position confirmed with ultrasound. Images obtained for documentation. Several core biopsies were obtained of the lesion. Sampling was fragmented but core biopsies were obtained. Samples placed in formalin. Also, the 17 gauge needle was utilized to perform aspiration of a more central cystic or necrotic component. Approximately 8 cc exudative bloody fluid was aspirated. This was sent for culture. Needle removed. Postprocedure imaging demonstrates no hemorrhage or hematoma. Patient tolerated the biopsy well. IMPRESSION: Successful ultrasound right inferior  liver core biopsy and aspiration as above. Sample sent for surgical pathology and culture. Electronically Signed   By: Jerilynn Mages.  Shick M.D.   On: 03/19/2020 12:57   DG Chest Portable 1 View  Result Date: 03/16/2020 CLINICAL DATA:  Shortness of breath EXAM: PORTABLE CHEST 1 VIEW COMPARISON:  March 15, 2020 FINDINGS: There is mild cardiomegaly. Aortic knob calcifications are seen. Prominence of the central pulmonary vasculature are again noted. No large airspace consolidation or pleural effusion. A right-sided MediPort catheter seen with the tip in the mid SVC. No acute osseous abnormality. A small hiatal hernia is present. IMPRESSION: Mild pulmonary vascular congestion. Electronically Signed   By: Prudencio Pair M.D.   On: 03/16/2020 01:23   DG Chest Port 1 View  Result Date: 03/15/2020 CLINICAL DATA:  Fever.  Chemotherapy yesterday. EXAM: PORTABLE CHEST 1 VIEW COMPARISON:  No prior chest imaging. FINDINGS: Right chest port remains in place. Upper normal heart size with retrocardiac hiatal  hernia. No focal airspace disease. No pleural effusion or pneumothorax. No evidence of pulmonary edema. Scoliotic curvature of the spine without acute osseous abnormalities. IMPRESSION: 1. No acute chest findings. 2. Hiatal hernia. Electronically Signed   By: Keith Rake M.D.   On: 03/15/2020 19:52   DG Shoulder Left  Result Date: 03/18/2020 CLINICAL DATA:  Left shoulder pain EXAM: LEFT SHOULDER - 2+ VIEW COMPARISON:  None. FINDINGS: Alignment appears anatomic. There is no acute fracture. No substantial joint space narrowing. Mild degenerative changes at the clavicular joint. Small subacromial spur. IMPRESSION: No significant osseous abnormality. Electronically Signed   By: Macy Mis M.D.   On: 03/18/2020 15:43   ECHOCARDIOGRAM COMPLETE  Result Date: 03/17/2020    ECHOCARDIOGRAM REPORT   Patient Name:   SHAUNTIA LEVENGOOD Date of Exam: 03/17/2020 Medical Rec #:  [id number]      Height:       61.0 in Accession #:    [id number]     Weight:       95.0 lb Date of Birth:  1928-08-01      BSA:          1.376 m Patient Age:    86 years       BP:           124/76 mmHg Patient Gender: F              HR:           90 bpm. Exam Location:  [facility] Procedure: 2D Echo Indications:     CHF-ACUTE DIASTOLIC 287.86/V67.20  History:         Patient has no prior history of Echocardiogram examinations.                  Risk Factors:Hypertension. ASTHMA.  Sonographer:     Avanell Shackleton Referring Phys:  [id number] Sharen Hones Diagnosing Phys: Kirk Ruths MD IMPRESSIONS  1. Left ventricular ejection fraction, by estimation, is 60 to 65%. The left ventricle has normal function. The left ventricle has no regional wall motion abnormalities. There is mild left ventricular hypertrophy. Left ventricular diastolic parameters are consistent with Grade I diastolic dysfunction (impaired relaxation). Elevated left atrial pressure.  2. Right ventricular systolic function is normal. The right ventricular size is normal. There is normal pulmonary  artery systolic pressure.  3. Left atrial size was mildly dilated.  4. Moderate pleural effusion in the left lateral region.  5. The mitral valve is normal in structure. Trivial mitral valve regurgitation. No evidence of  mitral stenosis.  6. The aortic valve was not well visualized. Aortic valve regurgitation is not visualized. No aortic stenosis is present.  7. The inferior vena cava is normal in size with greater than 50% respiratory variability, suggesting right atrial pressure of 3 mmHg. FINDINGS  Left Ventricle: Left ventricular ejection fraction, by estimation, is 60 to 65%. The left ventricle has normal function. The left ventricle has no regional wall motion abnormalities. The left ventricular internal cavity size was normal in size. There is  mild left ventricular hypertrophy. Left ventricular diastolic parameters are consistent with Grade I diastolic dysfunction (impaired relaxation). Elevated left atrial pressure. Right Ventricle: The right ventricular size is normal.Right ventricular systolic function is normal. There is normal pulmonary artery systolic pressure. The tricuspid regurgitant velocity is 2.62 m/s, and with an assumed right atrial pressure of 3 mmHg, the estimated right ventricular systolic pressure is 51.8 mmHg. Left Atrium: Left atrial size was mildly dilated. Right Atrium: Right atrial size was normal in size. Pericardium: There is no evidence of pericardial effusion. Mitral Valve: The mitral valve is normal in structure. Normal mobility of the mitral valve leaflets. Mild mitral annular calcification. Trivial mitral valve regurgitation. No evidence of mitral valve stenosis. Tricuspid Valve: The tricuspid valve is normal in structure. Tricuspid valve regurgitation is mild . No evidence of tricuspid stenosis. Aortic Valve: The aortic valve was not well visualized. Aortic valve regurgitation is not visualized. No aortic stenosis is present. Aortic valve mean gradient measures 7.7 mmHg. Aortic  valve peak gradient measures 14.0 mmHg. Aortic valve area, by VTI measures 1.30 cm. Pulmonic Valve: The pulmonic valve was not well visualized. Pulmonic valve regurgitation is not visualized. No evidence of pulmonic stenosis. Aorta: The aortic root is normal in size and structure. Venous: The inferior vena cava is normal in size with greater than 50% respiratory variability, suggesting right atrial pressure of 3 mmHg. IAS/Shunts: No atrial level shunt detected by color flow Doppler. Additional Comments: There is a moderate pleural effusion in the left lateral region.  LEFT VENTRICLE PLAX 2D LVIDd:         3.42 cm  Diastology LVIDs:         2.04 cm  LV e' lateral:   6.42 cm/s LV PW:         0.75 cm  LV E/e' lateral: 19.0 LV IVS:        1.08 cm  LV e' medial:    7.83 cm/s LVOT diam:     1.50 cm  LV E/e' medial:  15.6 LV SV:         47 LV SV Index:   34 LVOT Area:     1.77 cm  IVC IVC diam: 1.69 cm LEFT ATRIUM             Index       RIGHT ATRIUM           Index LA diam:        3.60 cm 2.62 cm/m  RA Area:     12.00 cm LA Vol (A2C):   51.2 ml 37.20 ml/m RA Volume:   23.00 ml  16.71 ml/m LA Vol (A4C):   39.7 ml 28.84 ml/m LA Biplane Vol: 46.7 ml 33.93 ml/m  AORTIC VALVE AV Area (Vmax):    1.19 cm AV Area (Vmean):   1.28 cm AV Area (VTI):     1.30 cm AV Vmax:           187.33 cm/s AV Vmean:  130.000 cm/s AV VTI:            0.359 m AV Peak Grad:      14.0 mmHg AV Mean Grad:      7.7 mmHg LVOT Vmax:         126.50 cm/s LVOT Vmean:        94.250 cm/s LVOT VTI:          0.264 m LVOT/AV VTI ratio: 0.73  AORTA Ao Root diam: 2.70 cm MITRAL VALVE                TRICUSPID VALVE MV Area (PHT): 4.10 cm     TR Peak grad:   27.5 mmHg MV Decel Time: 185 msec     TR Vmax:        262.00 cm/s MV E velocity: 122.00 cm/s MV A velocity: 157.00 cm/s  SHUNTS MV E/A ratio:  0.78         Systemic VTI:  0.26 m                             Systemic Diam: 1.50 cm Kirk Ruths MD Electronically signed by Kirk Ruths MD  Signature Date/Time: 03/17/2020/3:04:49 PM    Final    ECHO TEE  Result Date: 03/22/2020    TRANSESOPHOGEAL ECHO REPORT   Patient Name:   GENEAL HUEBERT Date of Exam: 03/22/2020 Medical Rec #:  [id number]      Height:       61.0 in Accession #:    [id number]     Weight:       95.0 lb Date of Birth:  03-Jul-1928      BSA:          1.376 m Patient Age:    74 years       BP:           143/78 mmHg Patient Gender: F              HR:           79 bpm. Exam Location:  [facility] Procedure: Transesophageal Echo, Cardiac Doppler and Color Doppler Indications:     MSSA bacteremia  History:         Patient has prior history of Echocardiogram examinations, most                  recent 03/17/2020. Signs/Symptoms:Dyspnea; Risk                  Factors:Hypertension.  Sonographer:     Sherrie Sport RDCS (AE) Referring Phys:  [id number] Arvil Chaco Diagnosing Phys: Nelva Bush MD PROCEDURE: After discussion of the risks and benefits of a TEE, an informed consent was obtained from the patient. TEE procedure time was 15 minutes. The transesophogeal probe was passed without difficulty through the esophogus of the patient. Imaged were obtained with the patient in a left lateral decubitus position. Local oropharyngeal anesthetic was provided with viscous lidocaine. Sedation performed by performing physician. Patients was under conscious sedation during this procedure. Anesthetic administered: 76mg of Fentanyl, 35mof Versed. Image quality was adequate. The patient's vital signs; including heart rate, blood pressure, and oxygen saturation; remained stable throughout the procedure. The patient developed no complications during the procedure. IMPRESSIONS  1. Left ventricular ejection fraction, by estimation, is 55 to 60%. The left ventricle has normal function. The left ventricle has no regional wall motion abnormalities.  2. Right ventricular  systolic function is normal. The right ventricular size is normal.  3. No left atrial/left atrial  appendage thrombus was detected.  4. The mitral valve is degenerative. Mild to moderate mitral valve regurgitation.  5. The aortic valve is tricuspid. Aortic valve regurgitation is trivial.  6. Mildly dilated pulmonary artery. Conclusion(s)/Recommendation(s): No evidence of vegetation/infective endocarditis on this transesophageal echocardiogram. FINDINGS  Left Ventricle: Left ventricular ejection fraction, by estimation, is 55 to 60%. The left ventricle has normal function. The left ventricle has no regional wall motion abnormalities. Right Ventricle: The right ventricular size is normal. Right ventricular systolic function is normal. Left Atrium: No left atrial/left atrial appendage thrombus was detected. Pericardium: Trivial pericardial effusion is present. Mitral Valve: The mitral valve is degenerative in appearance. There is mild prolapse of of the mitral valve. There is mild thickening of the mitral valve leaflet(s). Mild mitral annular calcification. Mild to moderate mitral valve regurgitation. There is  no evidence of mitral valve vegetation. Tricuspid Valve: The tricuspid valve is normal in structure. Tricuspid valve regurgitation is mild. There is no evidence of tricuspid valve vegetation. Aortic Valve: The aortic valve is tricuspid. Aortic valve regurgitation is trivial. There is mild calcification of the aortic valve. There is no evidence of aortic valve vegetation. Pulmonic Valve: The pulmonic valve was normal in structure. Pulmonic valve regurgitation is mild. There is no evidence of pulmonic valve vegetation. Aorta: The aortic root and ascending aorta are structurally normal, with no evidence of dilitation. Pulmonary Artery: The pulmonary artery is mildly dilated. IAS/Shunts: No atrial level shunt detected by color flow Doppler. Nelva Bush MD Electronically signed by Nelva Bush MD Signature Date/Time: 03/22/2020/12:18:38 PM    Final    US Abdomen Limited RUQ  Result Date:  03/16/2020 CLINICAL DATA:  Multiple liver lesions. Possible Mets versus abscesses. EXAM: ULTRASOUND ABDOMEN LIMITED RIGHT UPPER QUADRANT COMPARISON:  CT abdomen/pelvis 03/15/2020 FINDINGS: Gallbladder: No gallstones or wall thickening visualized. No sonographic Murphy sign noted by sonographer. Common bile duct: Diameter: 3 mm Liver: Multiple hypoechoic masses are present scattered throughout the liver. The masses appear hypoechoic but solid in nature. No definite internal fluid component or debris to suggest the presence of abscesses. The largest mass in the central aspect of the right hemi liver measures 7.3 x 5.2 x 6.9 cm. Two additional index lesions measured 2 x 1.4 x 2.2 cm and 1.9 x 1.5 x 1.6 cm respectively. Portal vein is patent on color Doppler imaging with normal direction of blood flow towards the liver. Other: Technically challenging examination secondary to labored breathing. IMPRESSION: Technically challenging examination secondary to labored breathing. The multifocal hepatic lesions appear hypoechoic but solid in nature more consistent with hepatic metastatic disease than multifocal hepatic abscesses. Electronically Signed   By: Jacqulynn Cadet M.D.   On: 03/16/2020 14:51        The results of significant diagnostics from this hospitalization (including imaging, microbiology, ancillary and laboratory) are listed below for reference.     Microbiology: Recent Results (from the past 240 hour(s))  Culture, blood (Routine x 2)     Status: Abnormal   Collection Time: 03/15/20  5:48 PM   Specimen: BLOOD  Result Value Ref Range Status   Specimen Description   Final    BLOOD LARM Performed at [facility], [address], [place], [place] [address]    Special Requests   Final    BOTTLES DRAWN AEROBIC AND ANAEROBIC Blood Culture adequate volume Performed at [facility], [address] [place], [place] [address]    Culture  Setup Time   Final    IN BOTH AEROBIC  AND ANAEROBIC BOTTLES NO ORGANISMS SEEN Performed at [facility], [address] [place], [place] [address]    Culture (A)  Final    STREPTOCOCCUS INTERMEDIUS SUSCEPTIBILITIES PERFORMED ON PREVIOUS CULTURE WITHIN THE LAST 5 DAYS. Performed at [facility], [place] [address], [place], [place] [address]    Report Status 03/19/2020 FINAL  Final  Culture, blood (Routine x 2)     Status: Abnormal   Collection Time: 03/15/20  5:49 PM   Specimen: BLOOD  Result Value Ref Range Status   Specimen Description   Final    BLOOD RAC Performed at [facility], [address] [place], [place] [address]    Special Requests   Final    BOTTLES DRAWN AEROBIC AND ANAEROBIC Blood Culture adequate volume Performed at [facility], [address] [place], [place] [address]    Culture  Setup Time   Final    Organism ID to follow IN BOTH AEROBIC AND ANAEROBIC BOTTLES GRAM POSITIVE COCCI CRITICAL RESULT CALLED TO, READ BACK BY AND VERIFIED WITH: DAVID BESANTI _0  03/17/2020 TTG Performed at [facility], [address] [place], [place] [address]    Culture STREPTOCOCCUS INTERMEDIUS (A)  Final   Report Status 03/19/2020 FINAL  Final   Organism ID, Bacteria STREPTOCOCCUS INTERMEDIUS  Final      Susceptibility   Streptococcus intermedius - MIC*    PENICILLIN <=0.06 SENSITIVE Sensitive     CEFTRIAXONE <=0.12 SENSITIVE Sensitive     ERYTHROMYCIN <=0.12 SENSITIVE Sensitive     LEVOFLOXACIN 0.5 SENSITIVE Sensitive     VANCOMYCIN 0.5 SENSITIVE Sensitive     * STREPTOCOCCUS INTERMEDIUS  Blood Culture ID Panel (Reflexed)     Status: Abnormal   Collection Time: 03/15/20  5:49 PM  Result Value Ref Range Status   Enterococcus species NOT DETECTED NOT DETECTED Final   Listeria monocytogenes NOT DETECTED NOT DETECTED Final   Staphylococcus species NOT DETECTED NOT DETECTED Final   Staphylococcus aureus (BCID) NOT DETECTED NOT DETECTED Final   Streptococcus species  DETECTED (A) NOT DETECTED Final    Comment: Not Enterococcus species, Streptococcus agalactiae, Streptococcus pyogenes, or Streptococcus pneumoniae. CRITICAL RESULT CALLED TO, READ BACK BY AND VERIFIED WITH: DAVID BESANTI _1  03/17/2020 TTG    Streptococcus agalactiae NOT DETECTED NOT DETECTED Final   Streptococcus pneumoniae NOT DETECTED NOT DETECTED Final   Streptococcus pyogenes NOT DETECTED NOT DETECTED Final   Acinetobacter baumannii NOT DETECTED NOT DETECTED Final   Enterobacteriaceae species NOT DETECTED NOT DETECTED Final   Enterobacter cloacae complex NOT DETECTED NOT DETECTED Final   Escherichia coli NOT DETECTED NOT DETECTED Final   Klebsiella oxytoca NOT DETECTED NOT DETECTED Final   Klebsiella pneumoniae NOT DETECTED NOT DETECTED Final   Proteus species NOT DETECTED NOT DETECTED Final   Serratia marcescens NOT DETECTED NOT DETECTED Final   Haemophilus influenzae NOT DETECTED NOT DETECTED Final   Neisseria meningitidis NOT DETECTED NOT DETECTED Final   Pseudomonas aeruginosa NOT DETECTED NOT DETECTED Final   Candida albicans NOT DETECTED NOT DETECTED Final   Candida glabrata NOT DETECTED NOT DETECTED Final   Candida krusei NOT DETECTED NOT DETECTED Final   Candida parapsilosis NOT DETECTED NOT DETECTED Final   Candida tropicalis NOT DETECTED NOT DETECTED Final    Comment: Performed at [facility], [address], [place], [place] [address]  SARS Coronavirus  2 by RT PCR (hospital order, performed in [facility] hospital lab) Nasopharyngeal Nasopharyngeal Swab     Status: None   Collection Time: 03/15/20  8:42 PM   Specimen: Nasopharyngeal Swab  Result Value Ref Range Status   SARS Coronavirus 2 NEGATIVE NEGATIVE Final    Comment: (NOTE) SARS-CoV-2 target nucleic acids are NOT DETECTED.  The SARS-CoV-2 RNA is generally detectable in upper and lower respiratory specimens during the acute phase of infection. The lowest concentration of SARS-CoV-2 viral copies  this assay can detect is 250 copies / mL. A negative result does not preclude SARS-CoV-2 infection and should not be used as the sole basis for treatment or other patient management decisions.  A negative result may occur with improper specimen collection / handling, submission of specimen other than nasopharyngeal swab, presence of viral mutation(s) within the areas targeted by this assay, and inadequate number of viral copies (<250 copies / mL). A negative result must be combined with clinical observations, patient history, and epidemiological information.  Fact Sheet for Patients:   StrictlyIdeas.no  Fact Sheet for Healthcare Providers: BankingDealers.co.za  This test is not yet approved or  cleared by the [place] FDA and has been authorized for detection and/or diagnosis of SARS-CoV-2 by FDA under an Emergency Use Authorization (EUA).  This EUA will remain in effect (meaning this test can be used) for the duration of the COVID-19 declaration under Section 564(b)(1) of the Act, 21 U.S.C. section 360bbb-3(b)(1), unless the authorization is terminated or revoked sooner.  Performed at [facility], [address], [place], [place] [address]   Urine culture     Status: None   Collection Time: 03/15/20  8:42 PM   Specimen: Urine, Random  Result Value Ref Range Status   Specimen Description   Final    URINE, RANDOM Performed at [facility], [address] [place], [place] [address]    Special Requests   Final    NONE Performed at [facility], [address], [place], [place] [address]    Culture   Final    NO GROWTH Performed at [facility], [place] [address] [place], [place] [address]    Report Status 03/17/2020 FINAL  Final  MRSA PCR Screening     Status: None   Collection Time: 03/16/20  5:00 AM   Specimen: Nasopharyngeal  Result Value Ref Range Status   MRSA by PCR NEGATIVE  NEGATIVE Final    Comment:        The GeneXpert MRSA Assay (FDA approved for NASAL specimens only), is one component of a comprehensive MRSA colonization surveillance program. It is not intended to diagnose MRSA infection nor to guide or monitor treatment for MRSA infections. Performed at [facility], [address] [place], [place] [address]   CULTURE, BLOOD (ROUTINE X 2) w Reflex to ID Panel     Status: None   Collection Time: 03/17/20  7:44 AM   Specimen: BLOOD  Result Value Ref Range Status   Specimen Description BLOOD LFA  Final   Special Requests   Final    BOTTLES DRAWN AEROBIC AND ANAEROBIC Blood Culture adequate volume   Culture   Final    NO GROWTH 5 DAYS Performed at [facility], [address], [place],  [address]    Report Status 03/22/2020 FINAL  Final  CULTURE, BLOOD (ROUTINE X 2) w Reflex to ID Panel     Status: None   Collection Time: 03/17/20  7:51 AM   Specimen: BLOOD  Result Value Ref Range Status   Specimen Description BLOOD RAC  Final   Special Requests   Final    BOTTLES DRAWN AEROBIC AND ANAEROBIC Blood Culture adequate volume   Culture   Final    NO GROWTH 5 DAYS Performed at [facility], [address], [place], [place] [address]    Report Status 03/22/2020 FINAL  Final  Aerobic/Anaerobic Culture (surgical/deep wound)     Status: None (Preliminary result)   Collection Time: 03/19/20 11:40 AM   Specimen: Liver; Abscess  Result Value Ref Range Status   Specimen Description   Final    LIVER Performed at [facility], [address] [place], [place] [address]    Special Requests   Final    NONE Performed at [facility], [address] [place], [place] [address]    Gram Stain   Final    MODERATE WBC PRESENT, PREDOMINANTLY PMN MODERATE GRAM POSITIVE COCCI    Culture   Final    NO GROWTH 3 DAYS NO ANAEROBES ISOLATED; CULTURE IN PROGRESS FOR 5 DAYS Performed at [place] [address], [place], [place] [address]    Report Status PENDING  Incomplete     Labs: BNP (last 3 results) Recent Labs    03/15/20 1748  BNP 812.7*   Basic Metabolic Panel: Recent Labs  Lab 03/16/20 0502 03/17/20 0602 03/18/20 0642 03/19/20 0409 03/22/20 0530  NA 128* 129* 131* 131* 134*  K 2.9* 3.5 3.3* 4.3 4.5  CL 99 99 101 101 99  CO2 20* 20* _0 GLUCOSE 112* 88 98 150* 107*  BUN 28* 29* 25* 24* 30*  CREATININE 0.87 1.02* 1.05* 0.72 0.82  0.83  CALCIUM 7.8* 7.8* 7.9* 8.2* 8.0*  MG  --  1.7 1.7 1.8 1.9  PHOS  --   --  2.7  --  3.8   Liver Function Tests: Recent Labs  Lab 03/15/20 1748 03/16/20 0502 03/22/20 0530  AST 149* 152*  --   ALT 74* 88*  --   ALKPHOS 86 66  --   BILITOT 0.7 1.2  --   PROT 5.6* 5.2*  --   ALBUMIN 2.8* 2.6* 2.2*   No results for input(s): LIPASE, AMYLASE in the last 168 hours. No results for input(s): AMMONIA in the last 168 hours. CBC: Recent Labs  Lab 03/15/20 1748 03/16/20 0502 03/17/20 0602 03/19/20 0409 03/22/20 0530  WBC 18.8* 15.6* 10.3 8.1  --   NEUTROABS 17.9*  --  8.8* 7.6  --   HGB 7.4* 8.5* 8.5* 9.6* 9.0*  HCT 21.5* 26.0* 24.9* 28.8* 25.6*  MCV 90.7 88.7 84.7 87.0  --   PLT 306 262 220 176  --    Cardiac Enzymes: No results for input(s): CKTOTAL, CKMB, CKMBINDEX, TROPONINI in the last 168 hours. BNP: Invalid input(s): POCBNP CBG: Recent Labs  Lab 03/16/20 0324 03/18/20 0734 03/19/20 0742  GLUCAP 107* 70 105*  105*   D-Dimer No results for input(s): DDIMER in the last 72 hours. Hgb A1c No results for input(s): HGBA1C in the last 72 hours. Lipid Profile No results for input(s): CHOL, HDL, LDLCALC, TRIG, CHOLHDL, LDLDIRECT in the last 72 hours. Thyroid function studies No results for input(s): TSH, T4TOTAL, T3FREE, THYROIDAB in the last 72 hours.  Invalid input(s): FREET3 Anemia work up Recent Labs    03/21/20 0510  VITAMINB12 1,463*  FOLATE 16.4  FERRITIN 628*  TIBC 199*  IRON 49    RETICCTPCT 0.5   Urinalysis    Component Value Date/Time   COLORURINE YELLOW (A) 03/15/2020 2042   APPEARANCEUR HAZY (A) 03/15/2020 2042   LABSPEC 1.021 03/15/2020 2042   PHURINE 5.0 03/15/2020 2042   GLUCOSEU 50 (A) 03/15/2020 2042   HGBUR MODERATE (A) 03/15/2020 2042   BILIRUBINUR NEGATIVE 03/15/2020 2042   KETONESUR NEGATIVE 03/15/2020 2042   PROTEINUR 30 (A) 03/15/2020 2042   NITRITE NEGATIVE 03/15/2020 2042   LEUKOCYTESUR NEGATIVE 03/15/2020 2042   Sepsis Labs Invalid input(s): PROCALCITONIN,  WBC,  LACTICIDVEN   Time coordinating discharge: 45 minutes  SIGNED:  Mercy Riding, MD  [facility] 03/22/2020, 2:44 PM  If 7PM-7AM, please contact night-coverage [email or web address] Password [email or web address]

## 2020-03-22 NOTE — TOC Transition Note (Addendum)
Transition of Care [facility]) - CM/SW Discharge Note   Patient Details  Name: Tamara Mckinney MRN: [id number] Date of Birth: 01-Oct-1927  Transition of Care [facility]) CM/SW Contact:  Elease Hashimoto, LCSW Phone Number: 03/22/2020, 3:08 PM   Clinical Narrative:   Met with two son's-Rudy and Phil to discuss discharge needs. She will need HHRN, PT and OT no preference of agencies. Have referred to [facility] and [facility] will do education prior to her discharge home today. Both son's aware pt will need 24 hr care when first goes home. Between her family members someone will be there with her. Pt was independent with rollator prior to admission. Son's plan to make bed lower for pt to more easily access. Made son's aware Alvis Lemmings will call for follow up and be out to home tomorrow. Have added 3 in1 via [employer] decided after note done      Barriers to Discharge: No Barriers Identified   Patient Goals and CMS Choice   CMS Medicare.gov Compare Post Acute Care list provided to:: Patient Choice offered to / list presented to : Patient  Discharge Placement                       Discharge Plan and Services In-house Referral: Clinical Social Work   Post Acute Care Choice: Durable Medical Equipment, Home Health (has rollator rolling walker)                    HH Arranged: RN, PT, OT HH Agency: [place] Date [facility] Agency Contacted: 03/22/20 Time [place]: [phone] Representative spoke with at [facility]: [place] ([place]) Interventions     Readmission Risk Interventions No flowsheet data found.

## 2020-03-22 NOTE — Progress Notes (Addendum)
Patient discharged home per MD order. Prescription given to patient. All discharge instructions given and all questions answered. 

## 2020-03-22 NOTE — Progress Notes (Signed)
ID  Pt doing well TEE negative Patient Vitals for the past 24 hrs:  BP Temp Temp src Pulse Resp SpO2  03/22/20 1222 (!) 151/78 (!) 97.5 F (36.4 C) Oral 74 16 99 %  03/22/20 0947 (!) 147/72 (!) 97.5 F (36.4 C) Oral 68 18 98 %  03/22/20 0915 -- -- -- 91 17 96 %  03/22/20 0900 (!) 155/77 -- -- 86 18 94 %  03/22/20 0845 (!) 143/78 -- -- 79 15 99 %  03/22/20 0837 (!) 176/89 -- -- 89 15 99 %  03/22/20 0832 (!) 186/100 -- -- 91 20 97 %  03/22/20 0827 (!) 150/89 -- -- 68 14 100 %  03/22/20 0822 (!) 180/79 -- -- 76 18 99 %  03/22/20 0751 (!) 170/72 -- -- 65 (!) 22 98 %  03/22/20 0546 140/77 97.8 F (36.6 C) Oral 69 15 96 %  03/22/20 0002 135/81 97.6 F (36.4 C) -- 77 18 96 %  03/21/20 1933 127/77 97.6 F (36.4 C) Oral 91 -- 96 %  03/21/20 1719 (!) 146/90 97.7 F (36.5 C) Oral 88 18 95 %    Awake and alert Chest b/l air entry abd soft Cns non focal PORT in place  Labs CBC Latest Ref Rng & Units 03/22/2020 03/19/2020 03/17/2020  WBC 4.0 - 10.5 K/uL - 8.1 10.3  Hemoglobin 12.0 - 15.0 g/dL 9.0(L) 9.6(L) 8.5(L)  Hematocrit 36 - 46 % 25.6(L) 28.8(L) 24.9(L)  Platelets 150 - 400 K/uL - 176 220    CMP Latest Ref Rng & Units 03/22/2020 03/22/2020 03/19/2020  Glucose 70 - 99 mg/dL 107(H) - 150(H)  BUN 8 - 23 mg/dL 30(H) - 24(H)  Creatinine 0.44 - 1.00 mg/dL 0.82 0.83 0.72  Sodium 135 - 145 mmol/L 134(L) - 131(L)  Potassium 3.5 - 5.1 mmol/L 4.5 - 4.3  Chloride 98 - 111 mmol/L 99 - 101  CO2 22 - 32 mmol/L 27 - 22  Calcium 8.9 - 10.3 mg/dL 8.0(L) - 8.2(L)  Total Protein 6.5 - 8.1 g/dL - - -  Total Bilirubin 0.3 - 1.2 mg/dL - - -  Alkaline Phos 38 - 126 U/L - - -  AST 15 - 41 U/L - - -  ALT 0 - 44 U/L - - -   Streptococcus intermedius bacteremia.  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 TEE negative for endocarditis This is not PORT infection so we  dont need to remove PORT Continue ceftriaxone 2 grams IV qd until 04/13/20  ?Stage IIIa ovarian cancer with peritoneal mets and splenorenal ligament lesion.  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

## 2020-03-22 NOTE — Progress Notes (Signed)
OT Cancellation Note  Patient Details Name: Tamara Mckinney MRN: [id number] DOB: 03-Apr-1928   Cancelled Treatment:    Reason Eval/Treat Not Completed: Patient at procedure or test/ unavailable  Pt off floor for TEE this AM. Will f/u for OT treatment as appropriate/able. Thank you.  Gerrianne Scale, [place], OTR/L ascom [phone] 03/22/20, 8:37 AM

## 2020-03-22 NOTE — Progress Notes (Signed)
PHARMACY CONSULT NOTE FOR:  OUTPATIENT  PARENTERAL ANTIBIOTIC THERAPY (OPAT)  Indication: S. Intermedius bacteremia and liver abscess  Regimen: Ceftriaxone 2gm IV q24h End date: 04/13/2020  IV antibiotic discharge orders are pended. To discharging provider:  please sign these orders via discharge navigator,  Select New Orders & click on the button choice - Manage This Unsigned Work.     Thank you for allowing pharmacy to be a part of this patient's care.  Doreene Eland, PharmD, BCPS.   Work Cell: [phone] 03/22/2020 2:34 PM

## 2020-03-22 NOTE — Treatment Plan (Signed)
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

## 2020-03-24 LAB — AEROBIC/ANAEROBIC CULTURE W GRAM STAIN (SURGICAL/DEEP WOUND): Culture: NO GROWTH

## 2020-03-25 ENCOUNTER — Encounter: Payer: Self-pay | Admitting: Internal Medicine

## 2020-03-26 ENCOUNTER — Telehealth: Payer: Self-pay | Admitting: *Deleted

## 2020-03-26 MED ORDER — MAGIC MOUTHWASH W/LIDOCAINE
5.0000 mL | Freq: Four times a day (QID) | ORAL | 1 refills | Status: DC | PRN
Start: 2020-03-26 — End: 2020-11-07

## 2020-03-26 NOTE — Telephone Encounter (Signed)
Script faxed.

## 2020-03-26 NOTE — Telephone Encounter (Signed)
Agreed and MM is fine. So is she drawing the tumor marker?

## 2020-03-26 NOTE — Telephone Encounter (Signed)
RN spoke with granddaughter and instructed medicated mouthwash had been faxed to [employer] in [place].  CG verbalized understanding.

## 2020-03-26 NOTE — Telephone Encounter (Signed)
She is not drawing tumor marker so patient will ned to keep her appointment for Lab/ MD as scheduled.

## 2020-03-26 NOTE — Telephone Encounter (Signed)
Dianne, RN with [facility] called asking if she could draw the labs we have ordered this Thursday for patient today. I advised her that patient is also to see doctor Thursday as well. She states that she does not know what the tumor marker is and that we can just go ahead and draw her labs as planned She reports that patient is having sever constipation, right sided abdominal pain, lower extremity edema, and mouth sores. She is having difficulty eating and drinking due to mouth sores she has had for a week now. She is requesting that we order Medicated mouth wash for patient. I advised that she can add Senna twice a day to her Miralax that she is already taking for her constipation. Please advise of any orders

## 2020-03-26 NOTE — Telephone Encounter (Signed)
Per Kristopher Oppenheim. Magic mouth wash script is not able to be compounded at their pharmacy. I personally faxed the script to [facility] and called and confirmed that they can fill this script at their pharmacy. Spoke with patient's son "rudy", who is aware to pick up magic mouth wash prescription at [facility].

## 2020-03-28 ENCOUNTER — Inpatient Hospital Stay: Payer: Medicare PPO | Attending: Oncology | Admitting: Oncology

## 2020-03-28 ENCOUNTER — Encounter: Payer: Self-pay | Admitting: Oncology

## 2020-03-28 ENCOUNTER — Inpatient Hospital Stay: Payer: Medicare PPO

## 2020-03-28 ENCOUNTER — Other Ambulatory Visit: Payer: Self-pay

## 2020-03-28 DIAGNOSIS — Z792 Long term (current) use of antibiotics: Secondary | ICD-10-CM | POA: Diagnosis not present

## 2020-03-28 DIAGNOSIS — D649 Anemia, unspecified: Secondary | ICD-10-CM | POA: Insufficient documentation

## 2020-03-28 DIAGNOSIS — E871 Hypo-osmolality and hyponatremia: Secondary | ICD-10-CM | POA: Insufficient documentation

## 2020-03-28 DIAGNOSIS — R5383 Other fatigue: Secondary | ICD-10-CM | POA: Diagnosis not present

## 2020-03-28 DIAGNOSIS — C569 Malignant neoplasm of unspecified ovary: Secondary | ICD-10-CM | POA: Diagnosis present

## 2020-03-28 DIAGNOSIS — M1990 Unspecified osteoarthritis, unspecified site: Secondary | ICD-10-CM | POA: Insufficient documentation

## 2020-03-28 DIAGNOSIS — R5381 Other malaise: Secondary | ICD-10-CM | POA: Insufficient documentation

## 2020-03-28 DIAGNOSIS — K219 Gastro-esophageal reflux disease without esophagitis: Secondary | ICD-10-CM | POA: Diagnosis not present

## 2020-03-28 DIAGNOSIS — R109 Unspecified abdominal pain: Secondary | ICD-10-CM | POA: Insufficient documentation

## 2020-03-28 DIAGNOSIS — R531 Weakness: Secondary | ICD-10-CM | POA: Insufficient documentation

## 2020-03-28 DIAGNOSIS — Z79899 Other long term (current) drug therapy: Secondary | ICD-10-CM | POA: Insufficient documentation

## 2020-03-28 DIAGNOSIS — K5900 Constipation, unspecified: Secondary | ICD-10-CM | POA: Insufficient documentation

## 2020-03-28 DIAGNOSIS — K750 Abscess of liver: Secondary | ICD-10-CM | POA: Insufficient documentation

## 2020-03-28 DIAGNOSIS — I110 Hypertensive heart disease with heart failure: Secondary | ICD-10-CM | POA: Insufficient documentation

## 2020-03-28 DIAGNOSIS — Z801 Family history of malignant neoplasm of trachea, bronchus and lung: Secondary | ICD-10-CM | POA: Insufficient documentation

## 2020-03-28 DIAGNOSIS — Z95828 Presence of other vascular implants and grafts: Secondary | ICD-10-CM

## 2020-03-28 LAB — COMPREHENSIVE METABOLIC PANEL
ALT: 28 U/L (ref 0–44)
AST: 38 U/L (ref 15–41)
Albumin: 2.8 g/dL — ABNORMAL LOW (ref 3.5–5.0)
Alkaline Phosphatase: 229 U/L — ABNORMAL HIGH (ref 38–126)
Anion gap: 11 (ref 5–15)
BUN: 22 mg/dL (ref 8–23)
CO2: 30 mmol/L (ref 22–32)
Calcium: 8.2 mg/dL — ABNORMAL LOW (ref 8.9–10.3)
Chloride: 91 mmol/L — ABNORMAL LOW (ref 98–111)
Creatinine, Ser: 0.78 mg/dL (ref 0.44–1.00)
GFR calc Af Amer: >60 mL/min (ref >60)
GFR calc non Af Amer: >60 mL/min (ref >60)
Glucose, Bld: 118 mg/dL — ABNORMAL HIGH (ref 70–99)
Potassium: 3.8 mmol/L (ref 3.5–5.1)
Sodium: 132 mmol/L — ABNORMAL LOW (ref 135–145)
Total Bilirubin: 0.4 mg/dL (ref 0.3–1.2)
Total Protein: 6.8 g/dL (ref 6.5–8.1)

## 2020-03-28 LAB — CBC WITH DIFFERENTIAL/PLATELET
Abs Immature Granulocytes: 0.58 10*3/uL — ABNORMAL HIGH (ref 0.00–0.07)
Basophils Absolute: 0 10*3/uL (ref 0.0–0.1)
Basophils Relative: 0 %
Eosinophils Absolute: 0 10*3/uL (ref 0.0–0.5)
Eosinophils Relative: 0 %
HCT: 29.4 % — ABNORMAL LOW (ref 36.0–46.0)
Hemoglobin: 9.6 g/dL — ABNORMAL LOW (ref 12.0–15.0)
Immature Granulocytes: 5 %
Lymphocytes Relative: 4 %
Lymphs Abs: 0.5 10*3/uL — ABNORMAL LOW (ref 0.7–4.0)
MCH: 28.4 pg (ref 26.0–34.0)
MCHC: 32.7 g/dL (ref 30.0–36.0)
MCV: 87 fL (ref 80.0–100.0)
Monocytes Absolute: 1 10*3/uL (ref 0.1–1.0)
Monocytes Relative: 8 %
Neutro Abs: 9.7 10*3/uL — ABNORMAL HIGH (ref 1.7–7.7)
Neutrophils Relative %: 83 %
Platelets: 346 10*3/uL (ref 150–400)
RBC: 3.38 MIL/uL — ABNORMAL LOW (ref 3.87–5.11)
RDW: 20.1 % — ABNORMAL HIGH (ref 11.5–15.5)
WBC: 11.7 10*3/uL — ABNORMAL HIGH (ref 4.0–10.5)
nRBC: 0 % (ref 0.0–0.2)

## 2020-03-28 MED ORDER — HEPARIN SOD (PORK) LOCK FLUSH 100 UNIT/ML IV SOLN
INTRAVENOUS | Status: AC
  Filled 2020-03-28: qty 5

## 2020-03-28 MED ORDER — HEPARIN SOD (PORK) LOCK FLUSH 100 UNIT/ML IV SOLN
500.0000 [IU] | Freq: Once | INTRAVENOUS | Status: AC
  Administered 2020-03-28: 500 [IU] via INTRAVENOUS
  Filled 2020-03-28: qty 5

## 2020-03-28 MED ORDER — SODIUM CHLORIDE 0.9% FLUSH
10.0000 mL | Freq: Once | INTRAVENOUS | Status: AC
  Administered 2020-03-28: 10 mL via INTRAVENOUS
  Filled 2020-03-28: qty 10

## 2020-03-28 NOTE — Progress Notes (Signed)
Tamara Mckinney  [phone] [phone]  ID: Tamara Mckinney OB: [birth year]  MR#: [id number]  IDP#:[id number]  Patient Care Team: Tamara Pitch, MD as PCP - General (Family Medicine) Tamara Jacks, RN as Registered Nurse Tamara Pun, MD as Consulting Physician (General Surgery) Tamara Huger, MD as Consulting Physician (Oncology)  CHIEF COMPLAINT: Stage IIIC ovarian cancer.  INTERVAL HISTORY: Patient returns to clinic today for further evaluation and hospital follow-up.  She was recently noted to have multiple liver abscess and is now on chronic IV antibiotics.  She currently feels well and is nearly back to her baseline.  She continues to complain of decreased memory.  She does not complain of any abdominal pain or bloating.  She has no neurologic complaints.  She has a good appetite and her weight has remained stable.  She denies any chest pain, shortness of breath, cough, or hemoptysis.  She has no abdominal pain.  She denies any nausea, vomiting, or diarrhea. She has no urinary complaints.  Patient offers no further specific complaints today.  REVIEW OF SYSTEMS:   Review of Systems  Constitutional: Positive for malaise/fatigue. Negative for fever and weight loss.  Respiratory: Negative.  Negative for cough and shortness of breath.   Cardiovascular: Negative.  Negative for chest pain and leg swelling.  Gastrointestinal: Positive for constipation. Negative for abdominal pain, blood in stool, diarrhea, heartburn, melena, nausea and vomiting.  Genitourinary: Negative.  Negative for dysuria.  Musculoskeletal: Positive for back pain. Negative for neck pain.  Skin: Negative.  Negative for itching and rash.  Neurological: Positive for weakness. Negative for dizziness and focal weakness.  Endo/Heme/Allergies: Does not bruise/bleed easily.  Psychiatric/Behavioral: Positive for memory loss. The patient is not nervous/anxious.     As per  HPI. Otherwise, a complete review of systems is negative.  PAST MEDICAL HISTORY: Past Medical History:  Diagnosis Date  . Anemia   . Arthritis   . Asthma   . Dyspnea   . GERD (gastroesophageal reflux disease)   . Hypertension   . Ovarian cancer ([place]) 2018    PAST SURGICAL HISTORY: Past Surgical History:  Procedure Laterality Date  . ABDOMINAL HYSTERECTOMY    . BREAST BIOPSY     x6  . BUNIONECTOMY Bilateral   . CATARACT EXTRACTION, BILATERAL    . FLEXIBLE SIGMOIDOSCOPY N/A 05/21/2017   Procedure: FLEXIBLE SIGMOIDOSCOPY;  Surgeon: Lin Landsman, MD;  Location: [facility] ENDOSCOPY;  Service: Gastroenterology;  Laterality: N/A;  . INCONTINENCE SURGERY    . PORTA CATH INSERTION N/A 06/16/2017   Procedure: PORTA CATH INSERTION;  Surgeon: Algernon Huxley, MD;  Location: [place] [facility] LAB;  Service: Cardiovascular;  Laterality: N/A;  . RECTAL SURGERY  04/2018  . REPAIR OF RECTAL PROLAPSE N/A 05/11/2017   Procedure: REPAIR OF RECTAL PROLAPSE;  Surgeon: Leonie Green, MD;  Location: [facility] ORS;  Service: General;  Laterality: N/A;  . TEE WITHOUT CARDIOVERSION N/A 03/22/2020   Procedure: TRANSESOPHAGEAL ECHOCARDIOGRAM (TEE);  Surgeon: Nelva Bush, MD;  Location: [facility] ORS;  Service: Cardiovascular;  Laterality: N/A;  . TONSILLECTOMY    . VAGINA SURGERY     uncertain procedure performed    FAMILY HISTORY: Family History  Problem Relation Age of Onset  . Heart disease Mother   . Heart disease Father   . Heart disease Sister   . Heart attack Sister   . Ulcerative colitis Brother   . Lung cancer Brother   . Thyroid cancer Sister   . Asthma  Sister   . Diabetes Sister   . Asthma Sister   . Pancreatic cancer Sister   . Dementia Sister   . Asthma Brother   . Heart disease Brother   . Asthma Brother   . Lung cancer Brother   . Asthma Brother   . Lung cancer Brother   . Lung cancer Brother   . Rheum arthritis Brother     ADVANCED DIRECTIVES (Y/N):  N  HEALTH  MAINTENANCE: Social History   Tobacco Use  . Smoking status: Never Smoker  . Smokeless tobacco: Never Used  Vaping Use  . Vaping Use: Never used  Substance Use Topics  . Alcohol use: No  . Drug use: No     Colonoscopy:  PAP:  Bone density:  Lipid panel:  No Known Allergies  Current Outpatient Medications  Medication Sig Dispense Refill  . acetaminophen (TYLENOL) 500 MG tablet Take 500-1,000 mg by mouth every 6 (six) hours as needed for mild pain, moderate pain or fever.     . cefTRIAXone (ROCEPHIN) IVPB Inject 2 g into the vein daily for 21 days. Indication: S. Intermedius bacteremia and liver abscess First Dose: Yes Last Day of Therapy:  04/13/2020 Labs - Once weekly:  CBC/D and CMP Method of administration: IV Push Method of administration may be changed at the discretion of home infusion pharmacist based upon assessment of the patient and/or caregiver's ability to self-administer the medication ordered. 22 Units 0  . cyclobenzaprine (FLEXERIL) 10 MG tablet TAKE ONE TABLET BY MOUTH THREE TIMES A DAY AS NEEDED (Patient taking differently: Take 10 mg by mouth 3 (three) times daily as needed for muscle spasms. ) 30 tablet 0  . feeding supplement, ENSURE ENLIVE, (ENSURE ENLIVE) LIQD Take 237 mLs by mouth 2 (two) times daily between meals. 237 mL 12  . iron polysaccharides (NIFEREX) 150 MG capsule Take 1 capsule (150 mg total) by mouth daily. 90 capsule 1  . lidocaine-prilocaine (EMLA) cream APPLY A SMALL AMOUNT TO PORT SITE AT LEAST 1 HOUR PRIOR TO IT BEING ACCESSED THEN COVER WITH PLASTIC WRAP 30 g 0  . magic mouthwash w/lidocaine SOLN Take 5 mLs by mouth 4 (four) times daily as needed for mouth pain. 240 mL 1  . montelukast (SINGULAIR) 10 MG tablet Take 10 mg by mouth at bedtime as needed (allergy symptoms).     . ondansetron (ZOFRAN) 4 MG tablet Take 1 tablet (4 mg total) by mouth every 6 (six) hours as needed for nausea. 20 tablet 0  . polyethylene glycol (MIRALAX / GLYCOLAX)  packet Take 17 g by mouth daily as needed for mild constipation or moderate constipation.     . valsartan (DIOVAN) 160 MG tablet Take 160 mg by mouth daily.      No current facility-administered medications for this visit.   Facility-Administered Medications Ordered in Other Visits  Medication Dose Route Frequency Provider Last Rate Last Admin  . sodium chloride flush (NS) 0.9 % injection 10 mL  10 mL Intravenous PRN Tamara Huger, MD   10 mL at 03/10/18 1200    OBJECTIVE: Vitals:   03/28/20 1133  BP: 118/76  Pulse: (!) 102  Resp: 18  Temp: 98.2 F (36.8 C)  SpO2: 97%     Body mass index is 18.18 kg/m.    ECOG FS:0 - Asymptomatic  General: Thin, no acute distress.  Sitting in a wheelchair. Eyes: Pink conjunctiva, anicteric sclera. HEENT: Normocephalic, moist mucous membranes. Lungs: No audible wheezing or coughing. Heart: Regular  rate and rhythm. Abdomen: Soft, nontender, no obvious distention. Musculoskeletal: No edema, cyanosis, or clubbing. Neuro: Alert, answering all questions appropriately. Cranial nerves grossly intact. Skin: No rashes or petechiae noted. Psych: Normal affect.   LAB RESULTS:  Lab Results  Component Value Date   NA 132 (L) 03/28/2020   K 3.8 03/28/2020   CL 91 (L) 03/28/2020   CO2 30 03/28/2020   GLUCOSE 118 (H) 03/28/2020   BUN 22 03/28/2020   CREATININE 0.78 03/28/2020   CALCIUM 8.2 (L) 03/28/2020   PROT 6.8 03/28/2020   ALBUMIN 2.8 (L) 03/28/2020   AST 38 03/28/2020   ALT 28 03/28/2020   ALKPHOS 229 (H) 03/28/2020   BILITOT 0.4 03/28/2020   GFRNONAA >60 03/28/2020   GFRAA >60 03/28/2020    Lab Results  Component Value Date   WBC 11.7 (H) 03/28/2020   NEUTROABS 9.7 (H) 03/28/2020   HGB 9.6 (L) 03/28/2020   HCT 29.4 (L) 03/28/2020   MCV 87.0 03/28/2020   PLT 346 03/28/2020     STUDIES: US Guided Needle Placement  Result Date: 03/19/2020 INDICATION: History of ovarian cancer, enlarging hepatic lesions EXAM: ULTRASOUND  CORE BIOPSY RIGHT INFERIOR HEPATIC LESION ULTRASOUND ASPIRATION RIGHT INFERIOR HEPATIC LESION (FOR CULTURE) MEDICATIONS: 1% ANESTHESIA/SEDATION: Moderate (conscious) sedation was employed during this procedure. A total of Versed 0.5 mg and Fentanyl 50 mcg was administered intravenously. Moderate Sedation Time: 15 minutes. The patient's level of consciousness and vital signs were monitored continuously by radiology nursing throughout the procedure under my direct supervision. FLUOROSCOPY TIME:  Fluoroscopy Time: NONE. COMPLICATIONS: None immediate. PROCEDURE: Informed written consent was obtained from the patient's family after a thorough discussion of the procedural risks, benefits and alternatives. All questions were addressed. Maximal Sterile Barrier Technique was utilized including caps, mask, sterile gowns, sterile gloves, sterile drape, hand hygiene and skin antiseptic. A timeout was performed prior to the initiation of the procedure. Previous imaging reviewed. Preliminary ultrasound performed. A heterogeneous mixed echogenicity right inferior liver mass was localized and marked in the mid axillary line through a lower intercostal space. This lesion was correlated with the prior CT from 03/15/2020. Of note, the lesion is complex and echogenic and more solid by ultrasound than a fluid collection or abscess. Under sterile conditions and local anesthesia, a 17 gauge coaxial guide needle was advanced to the margin of the lesion. Needle position confirmed with ultrasound. Images obtained for documentation. Several core biopsies were obtained of the lesion. Sampling was fragmented but core biopsies were obtained. Samples placed in formalin. Also, the 17 gauge needle was utilized to perform aspiration of a more central cystic or necrotic component. Approximately 8 cc exudative bloody fluid was aspirated. This was sent for culture. Needle removed. Postprocedure imaging demonstrates no hemorrhage or hematoma. Patient  tolerated the biopsy well. IMPRESSION: Successful ultrasound right inferior liver core biopsy and aspiration as above. Sample sent for surgical pathology and culture. Electronically Signed   By: Tamara Mckinney.  Tamara Mckinney M.D.   On: 03/19/2020 12:57   CT ABDOMEN PELVIS W CONTRAST  Result Date: 03/15/2020 CLINICAL DATA:  Abdominal abscess or infection suspected. EXAM: CT ABDOMEN AND PELVIS WITH CONTRAST TECHNIQUE: Multidetector CT imaging of the abdomen and pelvis was performed using the standard protocol following bolus administration of intravenous contrast. CONTRAST:  40mL OMNIPAQUE IOHEXOL 300 MG/ML  SOLN COMPARISON:  November 14, 2019 FINDINGS: Lower chest: Hiatal hernia similar to prior study. No consolidation. No pleural effusion. Hepatobiliary: New hepatic masses. (Image 19, series 2) 5.2 x 4.7 cm. This  is in the RIGHT hemi liver. And RIGHT hepatic vein and portal veins can be seen passing through this area. Another lesion measuring 2.4 cm in the anterior RIGHT hemi liver (image 15, series 2) 2 additional foci in the central RIGHT liver and 2 more near the dome of the RIGHT hemi liver of similar size. These are low-density and septate. Surrounding edema in the liver is not appreciated. No significant biliary duct dilation or pericholecystic stranding. Pancreas: Pancreas is largely encased in the distal splenorenal ligament by tumor implant that is now more confluent than on the previous study. This measures approximately 7.7 x 5.0 cm as compared to 9.3 x 5.5 cm. This shows some small locules of gas within the lateral portion of the tumor on image 16 of series 2, this is of uncertain significance adjacent to the splenic flexure. Spleen: Splenic hilum infiltrated by tumor with similar appearance as described. Adrenals/Urinary Tract: RIGHT adrenal lesion unchanged. Symmetric renal enhancement. No hydronephrosis. Urinary is distended and extends below the pelvic floor. Stomach/Bowel: Postoperative changes about the rectum.  Individual bowel loops are difficult to assess due to extensive tumor in the abdomen mildly dilated and filled with stool like material small-bowel loops are pushed into the RIGHT hemiabdomen. The appendix is normal. Vascular/Lymphatic: Tortuous abdominal aorta without aneurysmal dilation with calcified atheromatous plaques similar to the previous study. No retroperitoneal lymphadenopathy. No pelvic lymphadenopathy. Reproductive: Post hysterectomy. Signs of pelvic floor dysfunction as described. Other: No free air. LEFT retroperitoneal tumor implant is similar to the prior study. Musculoskeletal: No acute bone finding. Spinal degenerative changes and scoliotic curvature of the spine with similar appearance to previous imaging. IMPRESSION: 1. New hepatic masses that are suspicious for metastatic disease with some atypical features given that vessels can be seen passing through these areas and that they are seen in the setting of tumor with peritoneal predilection, less commonly associated with hepatic parenchymal disease though this could be seen in the setting of advanced ovarian cancer. In the setting of fever, hepatic abscesses should be considered. Correlate with any recent antibiotic administration as an outpatient that may explain the lack of edema. Ultimately sampling of these areas may be helpful to differentiate. 2. Decrease in size of some areas but with gas in the area of tumor in the splenorenal ligament, this raises the question of secondary infection or gas related to necrosis. 3. Enlarging dominant lesion in the anterior abdomen with displacement of numerous bowel loops. Aortic Atherosclerosis (ICD10-I70.0). Electronically Signed   By: Tamara Mckinney M.D.   On: 03/15/2020 20:07   US BIOPSY (LIVER)  Result Date: 03/19/2020 INDICATION: History of ovarian cancer, enlarging hepatic lesions EXAM: ULTRASOUND CORE BIOPSY RIGHT INFERIOR HEPATIC LESION ULTRASOUND ASPIRATION RIGHT INFERIOR HEPATIC LESION (FOR  CULTURE) MEDICATIONS: 1% ANESTHESIA/SEDATION: Moderate (conscious) sedation was employed during this procedure. A total of Versed 0.5 mg and Fentanyl 50 mcg was administered intravenously. Moderate Sedation Time: 15 minutes. The patient's level of consciousness and vital signs were monitored continuously by radiology nursing throughout the procedure under my direct supervision. FLUOROSCOPY TIME:  Fluoroscopy Time: NONE. COMPLICATIONS: None immediate. PROCEDURE: Informed written consent was obtained from the patient's family after a thorough discussion of the procedural risks, benefits and alternatives. All questions were addressed. Maximal Sterile Barrier Technique was utilized including caps, mask, sterile gowns, sterile gloves, sterile drape, hand hygiene and skin antiseptic. A timeout was performed prior to the initiation of the procedure. Previous imaging reviewed. Preliminary ultrasound performed. A heterogeneous mixed echogenicity right inferior liver  mass was localized and marked in the mid axillary line through a lower intercostal space. This lesion was correlated with the prior CT from 03/15/2020. Of note, the lesion is complex and echogenic and more solid by ultrasound than a fluid collection or abscess. Under sterile conditions and local anesthesia, a 17 gauge coaxial guide needle was advanced to the margin of the lesion. Needle position confirmed with ultrasound. Images obtained for documentation. Several core biopsies were obtained of the lesion. Sampling was fragmented but core biopsies were obtained. Samples placed in formalin. Also, the 17 gauge needle was utilized to perform aspiration of a more central cystic or necrotic component. Approximately 8 cc exudative bloody fluid was aspirated. This was sent for culture. Needle removed. Postprocedure imaging demonstrates no hemorrhage or hematoma. Patient tolerated the biopsy well. IMPRESSION: Successful ultrasound right inferior liver core biopsy and  aspiration as above. Sample sent for surgical pathology and culture. Electronically Signed   By: Tamara Mckinney.  Tamara Mckinney M.D.   On: 03/19/2020 12:57   DG Chest Portable 1 View  Result Date: 03/16/2020 CLINICAL DATA:  Shortness of breath EXAM: PORTABLE CHEST 1 VIEW COMPARISON:  March 15, 2020 FINDINGS: There is mild cardiomegaly. Aortic knob calcifications are seen. Prominence of the central pulmonary vasculature are again noted. No large airspace consolidation or pleural effusion. A right-sided MediPort catheter seen with the tip in the mid SVC. No acute osseous abnormality. A small hiatal hernia is present. IMPRESSION: Mild pulmonary vascular congestion. Electronically Signed   By: Tamara Mckinney M.D.   On: 03/16/2020 01:23   DG Chest Port 1 View  Result Date: 03/15/2020 CLINICAL DATA:  Fever.  Chemotherapy yesterday. EXAM: PORTABLE CHEST 1 VIEW COMPARISON:  No prior chest imaging. FINDINGS: Right chest port remains in place. Upper normal heart size with retrocardiac hiatal hernia. No focal airspace disease. No pleural effusion or pneumothorax. No evidence of pulmonary edema. Scoliotic curvature of the spine without acute osseous abnormalities. IMPRESSION: 1. No acute chest findings. 2. Hiatal hernia. Electronically Signed   By: Tamara Mckinney M.D.   On: 03/15/2020 19:52   DG Shoulder Left  Result Date: 03/18/2020 CLINICAL DATA:  Left shoulder pain EXAM: LEFT SHOULDER - 2+ VIEW COMPARISON:  None. FINDINGS: Alignment appears anatomic. There is no acute fracture. No substantial joint space narrowing. Mild degenerative changes at the clavicular joint. Small subacromial spur. IMPRESSION: No significant osseous abnormality. Electronically Signed   By: Tamara Mckinney M.D.   On: 03/18/2020 15:43   ECHOCARDIOGRAM COMPLETE  Result Date: 03/17/2020    ECHOCARDIOGRAM REPORT   Patient Name:   Tamara Mckinney Date of Exam: 03/17/2020 Medical Rec #:  [id number]      Height:       61.0 in Accession #:    [id number]     Weight:        95.0 lb Date of Birth:  Dec 04, 1927      BSA:          1.376 m Patient Age:    21 years       BP:           124/76 mmHg Patient Gender: F              HR:           90 bpm. Exam Location:  [facility] Procedure: 2D Echo Indications:     CHF-ACUTE DIASTOLIC 703.50/K93.81  History:         Patient has no prior history of Echocardiogram examinations.  Risk Factors:Hypertension. ASTHMA.  Sonographer:     Avanell Shackleton Referring Phys:  [id number] Sharen Hones Diagnosing Phys: Kirk Ruths MD IMPRESSIONS  1. Left ventricular ejection fraction, by estimation, is 60 to 65%. The left ventricle has normal function. The left ventricle has no regional wall motion abnormalities. There is mild left ventricular hypertrophy. Left ventricular diastolic parameters are consistent with Grade I diastolic dysfunction (impaired relaxation). Elevated left atrial pressure.  2. Right ventricular systolic function is normal. The right ventricular size is normal. There is normal pulmonary artery systolic pressure.  3. Left atrial size was mildly dilated.  4. Moderate pleural effusion in the left lateral region.  5. The mitral valve is normal in structure. Trivial mitral valve regurgitation. No evidence of mitral stenosis.  6. The aortic valve was not well visualized. Aortic valve regurgitation is not visualized. No aortic stenosis is present.  7. The inferior vena cava is normal in size with greater than 50% respiratory variability, suggesting right atrial pressure of 3 mmHg. FINDINGS  Left Ventricle: Left ventricular ejection fraction, by estimation, is 60 to 65%. The left ventricle has normal function. The left ventricle has no regional wall motion abnormalities. The left ventricular internal cavity size was normal in size. There is  mild left ventricular hypertrophy. Left ventricular diastolic parameters are consistent with Grade I diastolic dysfunction (impaired relaxation). Elevated left atrial pressure. Right Ventricle: The right  ventricular size is normal.Right ventricular systolic function is normal. There is normal pulmonary artery systolic pressure. The tricuspid regurgitant velocity is 2.62 m/s, and with an assumed right atrial pressure of 3 mmHg, the estimated right ventricular systolic pressure is 32.5 mmHg. Left Atrium: Left atrial size was mildly dilated. Right Atrium: Right atrial size was normal in size. Pericardium: There is no evidence of pericardial effusion. Mitral Valve: The mitral valve is normal in structure. Normal mobility of the mitral valve leaflets. Mild mitral annular calcification. Trivial mitral valve regurgitation. No evidence of mitral valve stenosis. Tricuspid Valve: The tricuspid valve is normal in structure. Tricuspid valve regurgitation is mild . No evidence of tricuspid stenosis. Aortic Valve: The aortic valve was not well visualized. Aortic valve regurgitation is not visualized. No aortic stenosis is present. Aortic valve mean gradient measures 7.7 mmHg. Aortic valve peak gradient measures 14.0 mmHg. Aortic valve area, by VTI measures 1.30 cm. Pulmonic Valve: The pulmonic valve was not well visualized. Pulmonic valve regurgitation is not visualized. No evidence of pulmonic stenosis. Aorta: The aortic root is normal in size and structure. Venous: The inferior vena cava is normal in size with greater than 50% respiratory variability, suggesting right atrial pressure of 3 mmHg. IAS/Shunts: No atrial level shunt detected by color flow Doppler. Additional Comments: There is a moderate pleural effusion in the left lateral region.  LEFT VENTRICLE PLAX 2D LVIDd:         3.42 cm  Diastology LVIDs:         2.04 cm  LV e' lateral:   6.42 cm/s LV PW:         0.75 cm  LV E/e' lateral: 19.0 LV IVS:        1.08 cm  LV e' medial:    7.83 cm/s LVOT diam:     1.50 cm  LV E/e' medial:  15.6 LV SV:         47 LV SV Index:   34 LVOT Area:     1.77 cm  IVC IVC diam: 1.69 cm LEFT ATRIUM  Index       RIGHT ATRIUM            Index LA diam:        3.60 cm 2.62 cm/m  RA Area:     12.00 cm LA Vol (A2C):   51.2 ml 37.20 ml/m RA Volume:   23.00 ml  16.71 ml/m LA Vol (A4C):   39.7 ml 28.84 ml/m LA Biplane Vol: 46.7 ml 33.93 ml/m  AORTIC VALVE AV Area (Vmax):    1.19 cm AV Area (Vmean):   1.28 cm AV Area (VTI):     1.30 cm AV Vmax:           187.33 cm/s AV Vmean:          130.000 cm/s AV VTI:            0.359 m AV Peak Grad:      14.0 mmHg AV Mean Grad:      7.7 mmHg LVOT Vmax:         126.50 cm/s LVOT Vmean:        94.250 cm/s LVOT VTI:          0.264 m LVOT/AV VTI ratio: 0.73  AORTA Ao Root diam: 2.70 cm MITRAL VALVE                TRICUSPID VALVE MV Area (PHT): 4.10 cm     TR Peak grad:   27.5 mmHg MV Decel Time: 185 msec     TR Vmax:        262.00 cm/s MV E velocity: 122.00 cm/s MV A velocity: 157.00 cm/s  SHUNTS MV E/A ratio:  0.78         Systemic VTI:  0.26 m                             Systemic Diam: 1.50 cm Kirk Ruths MD Electronically signed by Kirk Ruths MD Signature Date/Time: 03/17/2020/3:04:49 PM    Final    ECHO TEE  Result Date: 03/22/2020    TRANSESOPHOGEAL ECHO REPORT   Patient Name:   Tamara Mckinney Date of Exam: 03/22/2020 Medical Rec #:  [id number]      Height:       61.0 in Accession #:    [id number]     Weight:       95.0 lb Date of Birth:  11-Feb-1928      BSA:          1.376 m Patient Age:    42 years       BP:           143/78 mmHg Patient Gender: F              HR:           79 bpm. Exam Location:  [facility] Procedure: Transesophageal Echo, Cardiac Doppler and Color Doppler Indications:     MSSA bacteremia  History:         Patient has prior history of Echocardiogram examinations, most                  recent 03/17/2020. Signs/Symptoms:Dyspnea; Risk                  Factors:Hypertension.  Sonographer:     Sherrie Sport RDCS (AE) Referring Phys:  [id number] Arvil Chaco Diagnosing Phys: Nelva Bush MD PROCEDURE: After discussion of the risks and benefits of a TEE, an  informed consent was obtained from  the patient. TEE procedure time was 15 minutes. The transesophogeal probe was passed without difficulty through the esophogus of the patient. Imaged were obtained with the patient in a left lateral decubitus position. Local oropharyngeal anesthetic was provided with viscous lidocaine. Sedation performed by performing physician. Patients was under conscious sedation during this procedure. Anesthetic administered: 70mcg of Fentanyl, 3mg  of Versed. Image quality was adequate. The patient's vital signs; including heart rate, blood pressure, and oxygen saturation; remained stable throughout the procedure. The patient developed no complications during the procedure. IMPRESSIONS  1. Left ventricular ejection fraction, by estimation, is 55 to 60%. The left ventricle has normal function. The left ventricle has no regional wall motion abnormalities.  2. Right ventricular systolic function is normal. The right ventricular size is normal.  3. No left atrial/left atrial appendage thrombus was detected.  4. The mitral valve is degenerative. Mild to moderate mitral valve regurgitation.  5. The aortic valve is tricuspid. Aortic valve regurgitation is trivial.  6. Mildly dilated pulmonary artery. Conclusion(s)/Recommendation(s): No evidence of vegetation/infective endocarditis on this transesophageal echocardiogram. FINDINGS  Left Ventricle: Left ventricular ejection fraction, by estimation, is 55 to 60%. The left ventricle has normal function. The left ventricle has no regional wall motion abnormalities. Right Ventricle: The right ventricular size is normal. Right ventricular systolic function is normal. Left Atrium: No left atrial/left atrial appendage thrombus was detected. Pericardium: Trivial pericardial effusion is present. Mitral Valve: The mitral valve is degenerative in appearance. There is mild prolapse of of the mitral valve. There is mild thickening of the mitral valve leaflet(s). Mild mitral annular calcification. Mild  to moderate mitral valve regurgitation. There is  no evidence of mitral valve vegetation. Tricuspid Valve: The tricuspid valve is normal in structure. Tricuspid valve regurgitation is mild. There is no evidence of tricuspid valve vegetation. Aortic Valve: The aortic valve is tricuspid. Aortic valve regurgitation is trivial. There is mild calcification of the aortic valve. There is no evidence of aortic valve vegetation. Pulmonic Valve: The pulmonic valve was normal in structure. Pulmonic valve regurgitation is mild. There is no evidence of pulmonic valve vegetation. Aorta: The aortic root and ascending aorta are structurally normal, with no evidence of dilitation. Pulmonary Artery: The pulmonary artery is mildly dilated. IAS/Shunts: No atrial level shunt detected by color flow Doppler. Nelva Bush MD Electronically signed by Nelva Bush MD Signature Date/Time: 03/22/2020/12:18:38 PM    Final    US Abdomen Limited RUQ  Result Date: 03/16/2020 CLINICAL DATA:  Multiple liver lesions. Possible Mets versus abscesses. EXAM: ULTRASOUND ABDOMEN LIMITED RIGHT UPPER QUADRANT COMPARISON:  CT abdomen/pelvis 03/15/2020 FINDINGS: Gallbladder: No gallstones or wall thickening visualized. No sonographic Murphy sign noted by sonographer. Common bile duct: Diameter: 3 mm Liver: Multiple hypoechoic masses are present scattered throughout the liver. The masses appear hypoechoic but solid in nature. No definite internal fluid component or debris to suggest the presence of abscesses. The largest mass in the central aspect of the right hemi liver measures 7.3 x 5.2 x 6.9 cm. Two additional index lesions measured 2 x 1.4 x 2.2 cm and 1.9 x 1.5 x 1.6 cm respectively. Portal vein is patent on color Doppler imaging with normal direction of blood flow towards the liver. Other: Technically challenging examination secondary to labored breathing. IMPRESSION: Technically challenging examination secondary to labored breathing. The  multifocal hepatic lesions appear hypoechoic but solid in nature more consistent with hepatic metastatic disease than multifocal hepatic abscesses. Electronically Signed   By:  Tamara Mckinney M.D.   On: 03/16/2020 14:51    ASSESSMENT: Stage IIIC ovarian cancer.  PLAN:    1. Stage IIIC ovarian cancer: Patient is now considered platinum refractory and is receiving single agent gemcitabine. Plan to give treatment on days 1, 8, and 15 with a 22 off.  Patient received cycle 7, day 1 of single agent gemcitabine on March 14, 2020.  We will continue to hold treatment while patient is receiving IV antibiotics for her recently diagnosed liver abscesses.  Return to clinic in 1 month for further evaluation and discussion of reinitiation of treatment. 2.  Liver abscess: Appreciate infectious disease input.  Continue IV antibiotics.  Hold treatment as above. 3. Lupus anticoagulant: Patient was noted to have an elevated PTT as well as increased bleeding during her surgery for rectal prolapse.   4. Anemia: Chronic and unchanged.  Patient's most recent hemoglobin is 9.6. Previously, iron stores are within normal limits.  5.  Constipation: Continue OTC remedies as needed. 6.  Hyponatremia: Chronic and unchanged.  Sodium is 132 today. 7.  Back/abdominal pain: Continue tramadol as needed. 8.  Rectal bleeding: Likely secondary to hemorrhoids, monitor.  Patient also has a history of rectal prolapse and can consider referral back to surgery if necessary. 9.  Weakness and fatigue: Chronic and unchanged.  Patient expressed understanding and was in agreement with this plan. She also understands that She can call clinic at any time with any questions, concerns, or complaints.   Cancer Staging Malignant neoplasm of ovary [facility]) Staging form: Ovary, Fallopian Tube, and Primary Peritoneal Carcinoma, AJCC 8th Edition - Clinical stage from 05/29/2017: Stage IIIC (cT3c, cN1b, cM0) - Signed by Tamara Huger, MD on  05/29/2017   Tamara Huger, MD   03/28/2020 4:51 PM

## 2020-03-29 LAB — CA 125: Cancer Antigen (CA) 125: 120 U/mL — ABNORMAL HIGH (ref 0.0–38.1)

## 2020-04-05 ENCOUNTER — Telehealth: Payer: Self-pay | Admitting: Infectious Diseases

## 2020-04-05 ENCOUNTER — Other Ambulatory Visit: Payer: Self-pay | Admitting: *Deleted

## 2020-04-05 ENCOUNTER — Encounter: Payer: Self-pay | Admitting: Infectious Diseases

## 2020-04-05 DIAGNOSIS — K750 Abscess of liver: Secondary | ICD-10-CM

## 2020-04-05 NOTE — Telephone Encounter (Signed)
Called patient and spoke to son regarding critical labs on Low glucose of 25 and Ca of 4.5. patient is doing well- Likely a lab error- repeat labs on Monday She has an appt with me on Tuesday. Will discuss with Dr.Finnegan regarding repeat CT scan to look for the necrotic lesions in her liver- Pt is being treated fro strep intermidis bacteremia, multiple liver abscesses with ceftriaxone- she has underlying ovarian ca

## 2020-04-09 ENCOUNTER — Encounter: Payer: Self-pay | Admitting: Infectious Diseases

## 2020-04-09 ENCOUNTER — Other Ambulatory Visit: Payer: Self-pay

## 2020-04-09 ENCOUNTER — Ambulatory Visit: Payer: Medicare PPO | Attending: Infectious Diseases | Admitting: Infectious Diseases

## 2020-04-09 ENCOUNTER — Other Ambulatory Visit
Admission: RE | Admit: 2020-04-09 | Discharge: 2020-04-09 | Disposition: A | Discharge status: Home / Self Care | Payer: Medicare PPO | Attending: Infectious Diseases | Admitting: Infectious Diseases

## 2020-04-09 VITALS — BP 107/68 | HR 106 | Temp 98.6°F | Resp 16 | Ht 61.0 in | Wt 96.0 lb

## 2020-04-09 DIAGNOSIS — C569 Malignant neoplasm of unspecified ovary: Secondary | ICD-10-CM | POA: Diagnosis not present

## 2020-04-09 DIAGNOSIS — B954 Other streptococcus as the cause of diseases classified elsewhere: Secondary | ICD-10-CM | POA: Diagnosis not present

## 2020-04-09 DIAGNOSIS — K219 Gastro-esophageal reflux disease without esophagitis: Secondary | ICD-10-CM | POA: Diagnosis not present

## 2020-04-09 DIAGNOSIS — B955 Unspecified streptococcus as the cause of diseases classified elsewhere: Secondary | ICD-10-CM

## 2020-04-09 DIAGNOSIS — J45909 Unspecified asthma, uncomplicated: Secondary | ICD-10-CM | POA: Diagnosis not present

## 2020-04-09 DIAGNOSIS — K750 Abscess of liver: Secondary | ICD-10-CM | POA: Insufficient documentation

## 2020-04-09 DIAGNOSIS — K8309 Other cholangitis: Secondary | ICD-10-CM | POA: Diagnosis present

## 2020-04-09 DIAGNOSIS — D649 Anemia, unspecified: Secondary | ICD-10-CM

## 2020-04-09 DIAGNOSIS — R7881 Bacteremia: Secondary | ICD-10-CM | POA: Diagnosis not present

## 2020-04-09 DIAGNOSIS — D630 Anemia in neoplastic disease: Secondary | ICD-10-CM | POA: Diagnosis not present

## 2020-04-09 DIAGNOSIS — Z8249 Family history of ischemic heart disease and other diseases of the circulatory system: Secondary | ICD-10-CM | POA: Insufficient documentation

## 2020-04-09 DIAGNOSIS — Z79899 Other long term (current) drug therapy: Secondary | ICD-10-CM | POA: Insufficient documentation

## 2020-04-09 DIAGNOSIS — R760 Raised antibody titer: Secondary | ICD-10-CM | POA: Insufficient documentation

## 2020-04-09 DIAGNOSIS — M1990 Unspecified osteoarthritis, unspecified site: Secondary | ICD-10-CM | POA: Diagnosis not present

## 2020-04-09 DIAGNOSIS — I10 Essential (primary) hypertension: Secondary | ICD-10-CM | POA: Diagnosis not present

## 2020-04-09 DIAGNOSIS — C786 Secondary malignant neoplasm of retroperitoneum and peritoneum: Secondary | ICD-10-CM | POA: Insufficient documentation

## 2020-04-09 LAB — CBC WITH DIFFERENTIAL/PLATELET
Abs Immature Granulocytes: 0.09 10*3/uL — ABNORMAL HIGH (ref 0.00–0.07)
Basophils Absolute: 0.1 10*3/uL (ref 0.0–0.1)
Basophils Relative: 1 %
Eosinophils Absolute: 0.1 10*3/uL (ref 0.0–0.5)
Eosinophils Relative: 1 %
HCT: 31.5 % — ABNORMAL LOW (ref 36.0–46.0)
Hemoglobin: 9.8 g/dL — ABNORMAL LOW (ref 12.0–15.0)
Immature Granulocytes: 1 %
Lymphocytes Relative: 9 %
Lymphs Abs: 0.7 10*3/uL (ref 0.7–4.0)
MCH: 28.6 pg (ref 26.0–34.0)
MCHC: 31.1 g/dL (ref 30.0–36.0)
MCV: 91.8 fL (ref 80.0–100.0)
Monocytes Absolute: 0.8 10*3/uL (ref 0.1–1.0)
Monocytes Relative: 10 %
Neutro Abs: 6.2 10*3/uL (ref 1.7–7.7)
Neutrophils Relative %: 78 %
Platelets: 514 10*3/uL — ABNORMAL HIGH (ref 150–400)
RBC: 3.43 MIL/uL — ABNORMAL LOW (ref 3.87–5.11)
RDW: 20 % — ABNORMAL HIGH (ref 11.5–15.5)
WBC: 7.9 10*3/uL (ref 4.0–10.5)
nRBC: 0 % (ref 0.0–0.2)

## 2020-04-09 LAB — COMPREHENSIVE METABOLIC PANEL
ALT: 27 U/L (ref 0–44)
AST: 39 U/L (ref 15–41)
Albumin: 3.3 g/dL — ABNORMAL LOW (ref 3.5–5.0)
Alkaline Phosphatase: 144 U/L — ABNORMAL HIGH (ref 38–126)
Anion gap: 11 (ref 5–15)
BUN: 32 mg/dL — ABNORMAL HIGH (ref 8–23)
CO2: 28 mmol/L (ref 22–32)
Calcium: 8.7 mg/dL — ABNORMAL LOW (ref 8.9–10.3)
Chloride: 96 mmol/L — ABNORMAL LOW (ref 98–111)
Creatinine, Ser: 0.65 mg/dL (ref 0.44–1.00)
GFR calc Af Amer: >60 mL/min (ref >60)
GFR calc non Af Amer: >60 mL/min (ref >60)
Glucose, Bld: 78 mg/dL (ref 70–99)
Potassium: 4.8 mmol/L (ref 3.5–5.1)
Sodium: 135 mmol/L (ref 135–145)
Total Bilirubin: 0.6 mg/dL (ref 0.3–1.2)
Total Protein: 7.4 g/dL (ref 6.5–8.1)

## 2020-04-09 NOTE — Patient Instructions (Signed)
You are here for follow up of liver abscesses- you are on ceftriaxone and you will complete 4 weeks on 04/13/20. Will continue for another week or so till the repeat CT abdomen is done. Dr.Finnegan is organizing the CT abdomen- I have communicated with him

## 2020-04-09 NOTE — Progress Notes (Signed)
NAME: Tamara Mckinney  DOB: 1927-10-08  MRN: [id number]  Date/Time: 04/09/2020 9:31 AM   Subjective:  Here for follow up visit after recent hospitalization Was hospitalized between 03/15/20-03/22/20 for fever, bacteremia and multiple liver abscesses Pt here with her son  ? Tamara Mckinney is a 86 y.o. female with a history of Stage III ovarian cancer on chemotherapy, positive lupus anticoagulant, HTN, asthma, GERD, osteoarthritis and anemia of chronic disease  Was recently in hospital with fever and blood culture positive for streptococcus intermidius- She had multiple lesions in the liver which were initially thought to be metastatic disease- Had liver aspiration /biopsy on 03/19/20 and it showed HEPATIC TISSUE WITH EXTENSIVE NECROSIS, AND ASCENDING CHOLANGITIS,  WITH ABSCESS FORMATION.  She was discharged home on IV ceftriaxone to complete 4 weeks on 04/13/20 She is doing fine Has poor appetitie and feels full quickly Has seen Dr.Finnegan after discharge  Past Medical History:  Diagnosis Date  . Anemia   . Arthritis   . Asthma   . Dyspnea   . GERD (gastroesophageal reflux disease)   . Hypertension   . Ovarian cancer ([place]) 2018    Past Surgical History:  Procedure Laterality Date  . ABDOMINAL HYSTERECTOMY    . BREAST BIOPSY     x6  . BUNIONECTOMY Bilateral   . CATARACT EXTRACTION, BILATERAL    . FLEXIBLE SIGMOIDOSCOPY N/A 05/21/2017   Procedure: FLEXIBLE SIGMOIDOSCOPY;  Surgeon: Lin Landsman, MD;  Location: [facility];  Service: Gastroenterology;  Laterality: N/A;  . INCONTINENCE SURGERY    . PORTA CATH INSERTION N/A 06/16/2017   Procedure: PORTA CATH INSERTION;  Surgeon: Algernon Huxley, MD;  Location: [facility];  Service: Cardiovascular;  Laterality: N/A;  . RECTAL SURGERY  04/2018  . REPAIR OF RECTAL PROLAPSE N/A 05/11/2017   Procedure: REPAIR OF RECTAL PROLAPSE;  Surgeon: Leonie Green, MD;  Location: [facility];  Service: General;  Laterality: N/A;  . TEE  WITHOUT CARDIOVERSION N/A 03/22/2020   Procedure: TRANSESOPHAGEAL ECHOCARDIOGRAM (TEE);  Surgeon: Nelva Bush, MD;  Location: [facility];  Service: Cardiovascular;  Laterality: N/A;  . TONSILLECTOMY    . VAGINA SURGERY     uncertain procedure performed    Social History   Socioeconomic History  . Marital status: Widowed    Spouse name: Not on file  . Number of children: Not on file  . Years of education: Not on file  . Highest education level: Not on file  Occupational History  . Not on file  Tobacco Use  . Smoking status: Never Smoker  . Smokeless tobacco: Never Used  Vaping Use  . Vaping Use: Never used  Substance and Sexual Activity  . Alcohol use: No  . Drug use: No  . Sexual activity: Never  Other Topics Concern  . Not on file  Social History Narrative  . Not on file   Social Determinants of Health   Financial Resource Strain:   . Difficulty of Paying Living Expenses: Not on file  Food Insecurity:   . Worried About Charity fundraiser in the Last Year: Not on file  . Ran Out of Food in the Last Year: Not on file  Transportation Needs:   . Lack of Transportation (Medical): Not on file  . Lack of Transportation (Non-Medical): Not on file  Physical Activity:   . Days of Exercise per Week: Not on file  . Minutes of Exercise per Session: Not on file  Stress:   . Feeling  of Stress : Not on file  Social Connections:   . Frequency of Communication with Friends and Family: Not on file  . Frequency of Social Gatherings with Friends and Family: Not on file  . Attends Religious Services: Not on file  . Active Member of Clubs or Organizations: Not on file  . Attends Archivist Meetings: Not on file  . Marital Status: Not on file  Intimate Partner Violence:   . Fear of Current or Ex-Partner: Not on file  . Emotionally Abused: Not on file  . Physically Abused: Not on file  . Sexually Abused: Not on file    Family History  Problem Relation Age of Onset  .  Heart disease Mother   . Heart disease Father   . Heart disease Sister   . Heart attack Sister   . Ulcerative colitis Brother   . Lung cancer Brother   . Thyroid cancer Sister   . Asthma Sister   . Diabetes Sister   . Asthma Sister   . Pancreatic cancer Sister   . Dementia Sister   . Asthma Brother   . Heart disease Brother   . Asthma Brother   . Lung cancer Brother   . Asthma Brother   . Lung cancer Brother   . Lung cancer Brother   . Rheum arthritis Brother    No Known Allergies  ? Current Outpatient Medications  Medication Sig Dispense Refill  . acetaminophen (TYLENOL) 500 MG tablet Take 500-1,000 mg by mouth every 6 (six) hours as needed for mild pain, moderate pain or fever.     . cefTRIAXone (ROCEPHIN) IVPB Inject 2 g into the vein daily for 21 days. Indication: S. Intermedius bacteremia and liver abscess First Dose: Yes Last Day of Therapy:  04/13/2020 Labs - Once weekly:  CBC/D and CMP Method of administration: IV Push Method of administration may be changed at the discretion of home infusion pharmacist based upon assessment of the patient and/or caregiver's ability to self-administer the medication ordered. 22 Units 0  . cyclobenzaprine (FLEXERIL) 10 MG tablet TAKE ONE TABLET BY MOUTH THREE TIMES A DAY AS NEEDED (Patient taking differently: Take 10 mg by mouth 3 (three) times daily as needed for muscle spasms. ) 30 tablet 0  . feeding supplement, ENSURE ENLIVE, (ENSURE ENLIVE) LIQD Take 237 mLs by mouth 2 (two) times daily between meals. 237 mL 12  . iron polysaccharides (NIFEREX) 150 MG capsule Take 1 capsule (150 mg total) by mouth daily. 90 capsule 1  . lidocaine-prilocaine (EMLA) cream APPLY A SMALL AMOUNT TO PORT SITE AT LEAST 1 HOUR PRIOR TO IT BEING ACCESSED THEN COVER WITH PLASTIC WRAP 30 g 0  . magic mouthwash w/lidocaine SOLN Take 5 mLs by mouth 4 (four) times daily as needed for mouth pain. 240 mL 1  . montelukast (SINGULAIR) 10 MG tablet Take 10 mg by mouth  at bedtime as needed (allergy symptoms).     . ondansetron (ZOFRAN) 4 MG tablet Take 1 tablet (4 mg total) by mouth every 6 (six) hours as needed for nausea. 20 tablet 0  . polyethylene glycol (MIRALAX / GLYCOLAX) packet Take 17 g by mouth daily as needed for mild constipation or moderate constipation.     . valsartan (DIOVAN) 160 MG tablet Take 160 mg by mouth daily.      No current facility-administered medications for this visit.   Facility-Administered Medications Ordered in Other Visits  Medication Dose Route Frequency Provider Last Rate Last Admin  .  sodium chloride flush (NS) 0.9 % injection 10 mL  10 mL Intravenous PRN Lloyd Huger, MD   10 mL at 03/10/18 1200     Abtx:  Anti-infectives (From admission, onward)   None      REVIEW OF SYSTEMS:  Const: negative fever, negative chills, ++ weight loss Eyes: negative diplopia or visual changes, negative eye pain ENT: negative coryza, negative sore throat Resp: negative cough, hemoptysis, dyspnea Cards: negative for chest pain, palpitations, lower extremity edema GU: negative for frequency, dysuria and hematuria GI:+ abdominal pain, fullness Skin: negative for rash and pruritus Heme: negative for easy bruising and gum/nose bleeding MS: muscle weakness Neurolo:negative for headaches, dizziness, vertigo, memory problems  Psych: negative for feelings of anxiety, depression  Endocrine: negative for thyroid, diabetes Allergy/Immunology- negative for any medication or food allergies ?  Objective:  VITALS:  BP 107/68   Pulse (!) 106   Temp 98.6 F (37 C)   Resp 16   Ht 5\' 1"  (1.549 m)   Wt 96 lb (43.5 kg)   SpO2 98%   BMI 18.14 kg/m PHYSICAL EXAM:  General: Alert, cooperative, no distress, appears stated age.  Head: Normocephalic, without obvious abnormality, atraumatic. Eyes: Conjunctivae clear, anicteric sclerae. Pupils are equal ENT Nares normal. No drainage or sinus tenderness. Lips, mucosa, and tongue normal.  No Thrush Neck: Supple, symmetrical, no adenopathy, thyroid: non tender no carotid bruit and no JVD. Back: No CVA tenderness. Lungs: Clear to auscultation bilaterally. No Wheezing or Rhonchi. No rales. Heart: Regular rate and rhythm, no murmur, rub or gallop. Abdomen: Soft, firm mass felt occupying the upper abdomen Extremities: atraumatic, no cyanosis. No edema. No clubbing Skin: No rashes or lesions. Or bruising Lymph: Cervical, supraclavicular normal. Neurologic: Grossly non-focal Pertinent Labs Lab Results CBC    Component Value Date/Time   WBC 11.7 (H) 03/28/2020 1052   RBC 3.38 (L) 03/28/2020 1052   HGB 9.6 (L) 03/28/2020 1052   HCT 29.4 (L) 03/28/2020 1052   HCT 24.2 (L) 05/21/2017 1257   PLT 346 03/28/2020 1052   MCV 87.0 03/28/2020 1052   MCH 28.4 03/28/2020 1052   MCHC 32.7 03/28/2020 1052   RDW 20.1 (H) 03/28/2020 1052   LYMPHSABS 0.5 (L) 03/28/2020 1052   MONOABS 1.0 03/28/2020 1052   EOSABS 0.0 03/28/2020 1052   BASOSABS 0.0 03/28/2020 1052    CMP Latest Ref Rng & Units 03/28/2020 03/22/2020 03/22/2020  Glucose 70 - 99 mg/dL 118(H) 107(H) -  BUN 8 - 23 mg/dL 22 30(H) -  Creatinine 0.44 - 1.00 mg/dL 0.78 0.82 0.83  Sodium 135 - 145 mmol/L 132(L) 134(L) -  Potassium 3.5 - 5.1 mmol/L 3.8 4.5 -  Chloride 98 - 111 mmol/L 91(L) 99 -  CO2 22 - 32 mmol/L 30 27 -  Calcium 8.9 - 10.3 mg/dL 8.2(L) 8.0(L) -  Total Protein 6.5 - 8.1 g/dL 6.8 - -  Total Bilirubin 0.3 - 1.2 mg/dL 0.4 - -  Alkaline Phos 38 - 126 U/L 229(H) - -  AST 15 - 41 U/L 38 - -  ALT 0 - 44 U/L 28 - -     ? Impression/Recommendation ? ?Streptococcus intermedius bacteremia.  multiple lesions in the liver which are infectious necrosis and cholangitis- Gram stain shows many wbc and gram positive cocci but culture was negative- . TEE negative for endocarditis PORT not a source of infection and hence was not removed On  ceftriaxone 2 grams IV qd until 04/13/20 She will need a CT imaging  which will be  orginzed thru Dr.Finnegan's office- Communicated with him She will continue ceftriaxone  ?Stage IIIa ovarian cancer with peritoneal mets and splenorenal ligament lesion. 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 ___________________________________________________ Discussed with patient,and her  son in detail Note:  This document was prepared using Dragon voice recognition software and may include unintentional dictation errors.

## 2020-04-26 ENCOUNTER — Encounter: Payer: Self-pay | Admitting: Oncology

## 2020-04-26 NOTE — Progress Notes (Signed)
Spoke to patient's son states patient is having a lot of abdominal swelling and terrible indigestion . Would like to discuss. States patient's memory has also gotten worse. Would like to discuss any remedies for indigestion. Patent's son would also like to become the primary contact for [place] to call from now on. States his mother's memory has gotten worse.

## 2020-04-27 NOTE — Progress Notes (Signed)
[place]  [phone] Fax:(336) [phone]  ID: Tamara Mckinney OB: 09-06-1927  MR#: [id number]  VQQ#:[id number]  Patient Care Team: [name], MD as PCP - General (Family Medicine) [name], RN as Registered Nurse Herbert Pun, MD as Consulting Physician (General Surgery) Lloyd Huger, MD as Consulting Physician (Oncology)  CHIEF COMPLAINT: Stage IIIC ovarian cancer.  INTERVAL HISTORY: Patient returns to clinic today for repeat laboratory work and further evaluation. She has noticed increased abdominal pain and bloating. She also has increased reflux symptoms. She also states her memory has become worse over the past 1 to 2 weeks. She has no neurologic complaints.  She has a good appetite and her weight has remained stable.  She denies any chest pain, shortness of breath, cough, or hemoptysis.   She denies any nausea, vomiting, or diarrhea. She has no urinary complaints. Patient offers no further specific complaints today.  REVIEW OF SYSTEMS:   Review of Systems  Constitutional: Positive for malaise/fatigue. Negative for fever and weight loss.  Respiratory: Negative.  Negative for cough and shortness of breath.   Cardiovascular: Negative.  Negative for chest pain and leg swelling.  Gastrointestinal: Positive for abdominal pain, constipation and heartburn. Negative for blood in stool, diarrhea, melena, nausea and vomiting.  Genitourinary: Negative.  Negative for dysuria.  Musculoskeletal: Positive for back pain. Negative for neck pain.  Skin: Negative.  Negative for itching and rash.  Neurological: Positive for weakness. Negative for dizziness and focal weakness.  Endo/Heme/Allergies: Does not bruise/bleed easily.  Psychiatric/Behavioral: Positive for memory loss. The patient is not nervous/anxious.     As per HPI. Otherwise, a complete review of systems is negative.  PAST MEDICAL HISTORY: Past Medical History:    Diagnosis Date  . Anemia   . Arthritis   . Asthma   . Dyspnea   . GERD (gastroesophageal reflux disease)   . Hypertension   . Ovarian cancer ([place]) 2018    PAST SURGICAL HISTORY: Past Surgical History:  Procedure Laterality Date  . ABDOMINAL HYSTERECTOMY    . BREAST BIOPSY     x6  . BUNIONECTOMY Bilateral   . CATARACT EXTRACTION, BILATERAL    . FLEXIBLE SIGMOIDOSCOPY N/A 05/21/2017   Procedure: FLEXIBLE SIGMOIDOSCOPY;  Surgeon: Lin Landsman, MD;  Location: [facility] ENDOSCOPY;  Service: Gastroenterology;  Laterality: N/A;  . INCONTINENCE SURGERY    . PORTA CATH INSERTION N/A 06/16/2017   Procedure: PORTA CATH INSERTION;  Surgeon: Algernon Huxley, MD;  Location: [place] CV LAB;  Service: Cardiovascular;  Laterality: N/A;  . RECTAL SURGERY  04/2018  . REPAIR OF RECTAL PROLAPSE N/A 05/11/2017   Procedure: REPAIR OF RECTAL PROLAPSE;  Surgeon: Leonie Green, MD;  Location: [facility] ORS;  Service: General;  Laterality: N/A;  . TEE WITHOUT CARDIOVERSION N/A 03/22/2020   Procedure: TRANSESOPHAGEAL ECHOCARDIOGRAM (TEE);  Surgeon: Nelva Bush, MD;  Location: [facility] ORS;  Service: Cardiovascular;  Laterality: N/A;  . TONSILLECTOMY    . VAGINA SURGERY     uncertain procedure performed    FAMILY HISTORY: Family History  Problem Relation Age of Onset  . Heart disease Mother   . Heart disease Father   . Heart disease Sister   . Heart attack Sister   . Ulcerative colitis Brother   . Lung cancer Brother   . Thyroid cancer Sister   . Asthma Sister   . Diabetes Sister   . Asthma Sister   . Pancreatic cancer Sister   . Dementia Sister   .  Asthma Brother   . Heart disease Brother   . Asthma Brother   . Lung cancer Brother   . Asthma Brother   . Lung cancer Brother   . Lung cancer Brother   . Rheum arthritis Brother     ADVANCED DIRECTIVES (Y/N):  N  HEALTH MAINTENANCE: Social History   Tobacco Use  . Smoking status: Never Smoker  . Smokeless tobacco: Never Used   Vaping Use  . Vaping Use: Never used  Substance Use Topics  . Alcohol use: No  . Drug use: No     Colonoscopy:  PAP:  Bone density:  Lipid panel:  No Known Allergies  Current Outpatient Medications  Medication Sig Dispense Refill  . acetaminophen (TYLENOL) 500 MG tablet Take 500-1,000 mg by mouth every 6 (six) hours as needed for mild pain, moderate pain or fever.     . cyclobenzaprine (FLEXERIL) 10 MG tablet TAKE ONE TABLET BY MOUTH THREE TIMES A DAY AS NEEDED (Patient taking differently: Take 10 mg by mouth 3 (three) times daily as needed for muscle spasms. ) 30 tablet 0  . feeding supplement, ENSURE ENLIVE, (ENSURE ENLIVE) LIQD Take 237 mLs by mouth 2 (two) times daily between meals. 237 mL 12  . iron polysaccharides (NIFEREX) 150 MG capsule Take 1 capsule (150 mg total) by mouth daily. 90 capsule 1  . lidocaine-prilocaine (EMLA) cream APPLY A SMALL AMOUNT TO PORT SITE AT LEAST 1 HOUR PRIOR TO IT BEING ACCESSED THEN COVER WITH PLASTIC WRAP 30 g 0  . magic mouthwash w/lidocaine SOLN Take 5 mLs by mouth 4 (four) times daily as needed for mouth pain. 240 mL 1  . montelukast (SINGULAIR) 10 MG tablet Take 10 mg by mouth at bedtime as needed (allergy symptoms).     . ondansetron (ZOFRAN) 4 MG tablet Take 1 tablet (4 mg total) by mouth every 6 (six) hours as needed for nausea. 20 tablet 0  . polyethylene glycol (MIRALAX / GLYCOLAX) packet Take 17 g by mouth daily as needed for mild constipation or moderate constipation.     . valsartan (DIOVAN) 160 MG tablet Take 160 mg by mouth daily.      No current facility-administered medications for this visit.   Facility-Administered Medications Ordered in Other Visits  Medication Dose Route Frequency Provider Last Rate Last Admin  . sodium chloride flush (NS) 0.9 % injection 10 mL  10 mL Intravenous PRN Lloyd Huger, MD   10 mL at 03/10/18 1200    OBJECTIVE: Vitals:   04/29/20 1042  BP: (!) 136/100  Pulse: (!) 110  Resp: 20  Temp:  98.7 F (37.1 C)  SpO2: 100%     Body mass index is 17.61 kg/m.    ECOG FS:0 - Asymptomatic  General: Thin, no acute distress. Eyes: Pink conjunctiva, anicteric sclera. HEENT: Normocephalic, moist mucous membranes. Lungs: No audible wheezing or coughing. Heart: Regular rate and rhythm. Abdomen: Soft, nontender, no obvious distention. Musculoskeletal: No edema, cyanosis, or clubbing. Neuro: Alert, answering all questions appropriately. Cranial nerves grossly intact. Skin: No rashes or petechiae noted. Psych: Normal affect.  LAB RESULTS:  Lab Results  Component Value Date   NA 136 04/29/2020   K 4.4 04/29/2020   CL 101 04/29/2020   CO2 27 04/29/2020   GLUCOSE 137 (H) 04/29/2020   BUN 35 (H) 04/29/2020   CREATININE 0.81 04/29/2020   CALCIUM 8.4 (L) 04/29/2020   PROT 6.9 04/29/2020   ALBUMIN 3.5 04/29/2020   AST 40 04/29/2020  ALT 27 04/29/2020   ALKPHOS 83 04/29/2020   BILITOT 0.5 04/29/2020   GFRNONAA >60 04/29/2020   GFRAA >60 04/29/2020    Lab Results  Component Value Date   WBC 8.9 04/29/2020   NEUTROABS 6.9 04/29/2020   HGB 10.1 (L) 04/29/2020   HCT 32.2 (L) 04/29/2020   MCV 90.7 04/29/2020   PLT 269 04/29/2020     STUDIES: No results found.  ASSESSMENT: Stage IIIC ovarian cancer.  PLAN:    1. Stage IIIC ovarian cancer: Patient is now considered platinum refractory and is receiving single agent gemcitabine. Plan to give treatment on days 1, 8, and 15 with a 22 off.  Patient received cycle 7, day 1 of single agent gemcitabine on March 14, 2020. Patient has now completed IV antibiotics, therefore will reconsider starting treatment. Will get CT scan of the abdomen and pelvis to obtain a new baseline and to further assess her abdominal pain and bloating. Return to clinic on May 09, 2028 to reinitiate treatment.  2.  Liver abscess: Appreciate infectious disease input. Patient completed IV antibiotics approximately 1 week ago. Repeat CT scan as above to  assess for interval training of liver lesions. 3. Lupus anticoagulant: Patient was noted to have an elevated PTT as well as increased bleeding during her surgery for rectal prolapse.   4. Anemia: Mildly improved. Patient's hemoglobin is 10.1 today. Previously, iron stores are within normal limits.  5.  Constipation: Continue OTC remedies as needed. 6.  Hyponatremia: Resolved. 7.  Back/abdominal pain: Continue tramadol as needed. Imaging as above. 8.  Rectal bleeding: Likely secondary to hemorrhoids, monitor.  Patient also has a history of rectal prolapse and can consider referral back to surgery if necessary. 9.  Weakness and fatigue: Chronic and unchanged. 10. Memory: Patient reports she feels her memory is worse since discontinuing antibiotics. It is unlikely patient has recurrent infection. Monitor. 11. Reflux: Patient has been instructed to reinitiate Nexium.  Patient expressed understanding and was in agreement with this plan. She also understands that She can call clinic at any time with any questions, concerns, or complaints.   Cancer Staging Malignant neoplasm of ovary [facility]) Staging form: Ovary, Fallopian Tube, and Primary Peritoneal Carcinoma, AJCC 8th Edition - Clinical stage from 05/29/2017: Stage IIIC (cT3c, cN1b, cM0) - Signed by Lloyd Huger, MD on 05/29/2017   Lloyd Huger, MD   04/29/2020 2:54 PM

## 2020-04-29 ENCOUNTER — Inpatient Hospital Stay (HOSPITAL_BASED_OUTPATIENT_CLINIC_OR_DEPARTMENT_OTHER): Payer: Medicare PPO | Admitting: Oncology

## 2020-04-29 ENCOUNTER — Inpatient Hospital Stay: Payer: Medicare PPO | Attending: Oncology

## 2020-04-29 ENCOUNTER — Encounter: Payer: Self-pay | Admitting: Oncology

## 2020-04-29 ENCOUNTER — Other Ambulatory Visit: Payer: Self-pay

## 2020-04-29 VITALS — BP 136/100 | HR 110 | Temp 98.7°F | Resp 20 | Wt 93.2 lb

## 2020-04-29 DIAGNOSIS — Z8249 Family history of ischemic heart disease and other diseases of the circulatory system: Secondary | ICD-10-CM | POA: Insufficient documentation

## 2020-04-29 DIAGNOSIS — Z79899 Other long term (current) drug therapy: Secondary | ICD-10-CM | POA: Insufficient documentation

## 2020-04-29 DIAGNOSIS — Z833 Family history of diabetes mellitus: Secondary | ICD-10-CM | POA: Diagnosis not present

## 2020-04-29 DIAGNOSIS — K219 Gastro-esophageal reflux disease without esophagitis: Secondary | ICD-10-CM | POA: Diagnosis not present

## 2020-04-29 DIAGNOSIS — C569 Malignant neoplasm of unspecified ovary: Secondary | ICD-10-CM | POA: Diagnosis present

## 2020-04-29 DIAGNOSIS — Z95828 Presence of other vascular implants and grafts: Secondary | ICD-10-CM

## 2020-04-29 DIAGNOSIS — K5900 Constipation, unspecified: Secondary | ICD-10-CM | POA: Diagnosis not present

## 2020-04-29 DIAGNOSIS — Z800 Family history of malignant neoplasm of digestive organs: Secondary | ICD-10-CM | POA: Diagnosis not present

## 2020-04-29 DIAGNOSIS — J45909 Unspecified asthma, uncomplicated: Secondary | ICD-10-CM | POA: Diagnosis not present

## 2020-04-29 DIAGNOSIS — Z8261 Family history of arthritis: Secondary | ICD-10-CM | POA: Insufficient documentation

## 2020-04-29 DIAGNOSIS — I10 Essential (primary) hypertension: Secondary | ICD-10-CM | POA: Diagnosis not present

## 2020-04-29 DIAGNOSIS — Z5111 Encounter for antineoplastic chemotherapy: Secondary | ICD-10-CM | POA: Diagnosis present

## 2020-04-29 DIAGNOSIS — Z90710 Acquired absence of both cervix and uterus: Secondary | ICD-10-CM | POA: Insufficient documentation

## 2020-04-29 DIAGNOSIS — Z808 Family history of malignant neoplasm of other organs or systems: Secondary | ICD-10-CM | POA: Insufficient documentation

## 2020-04-29 DIAGNOSIS — D649 Anemia, unspecified: Secondary | ICD-10-CM | POA: Diagnosis not present

## 2020-04-29 DIAGNOSIS — E871 Hypo-osmolality and hyponatremia: Secondary | ICD-10-CM | POA: Insufficient documentation

## 2020-04-29 DIAGNOSIS — Z801 Family history of malignant neoplasm of trachea, bronchus and lung: Secondary | ICD-10-CM | POA: Diagnosis not present

## 2020-04-29 LAB — CBC WITH DIFFERENTIAL/PLATELET
Abs Immature Granulocytes: 0.02 10*3/uL (ref 0.00–0.07)
Basophils Absolute: 0.1 10*3/uL (ref 0.0–0.1)
Basophils Relative: 1 %
Eosinophils Absolute: 0 10*3/uL (ref 0.0–0.5)
Eosinophils Relative: 0 %
HCT: 32.2 % — ABNORMAL LOW (ref 36.0–46.0)
Hemoglobin: 10.1 g/dL — ABNORMAL LOW (ref 12.0–15.0)
Immature Granulocytes: 0 %
Lymphocytes Relative: 11 %
Lymphs Abs: 1 10*3/uL (ref 0.7–4.0)
MCH: 28.5 pg (ref 26.0–34.0)
MCHC: 31.4 g/dL (ref 30.0–36.0)
MCV: 90.7 fL (ref 80.0–100.0)
Monocytes Absolute: 0.9 10*3/uL (ref 0.1–1.0)
Monocytes Relative: 10 %
Neutro Abs: 6.9 10*3/uL (ref 1.7–7.7)
Neutrophils Relative %: 78 %
Platelets: 269 10*3/uL (ref 150–400)
RBC: 3.55 MIL/uL — ABNORMAL LOW (ref 3.87–5.11)
RDW: 18.7 % — ABNORMAL HIGH (ref 11.5–15.5)
WBC: 8.9 10*3/uL (ref 4.0–10.5)
nRBC: 0 % (ref 0.0–0.2)

## 2020-04-29 LAB — COMPREHENSIVE METABOLIC PANEL
ALT: 27 U/L (ref 0–44)
AST: 40 U/L (ref 15–41)
Albumin: 3.5 g/dL (ref 3.5–5.0)
Alkaline Phosphatase: 83 U/L (ref 38–126)
Anion gap: 8 (ref 5–15)
BUN: 35 mg/dL — ABNORMAL HIGH (ref 8–23)
CO2: 27 mmol/L (ref 22–32)
Calcium: 8.4 mg/dL — ABNORMAL LOW (ref 8.9–10.3)
Chloride: 101 mmol/L (ref 98–111)
Creatinine, Ser: 0.81 mg/dL (ref 0.44–1.00)
GFR calc Af Amer: >60 mL/min (ref >60)
GFR calc non Af Amer: >60 mL/min (ref >60)
Glucose, Bld: 137 mg/dL — ABNORMAL HIGH (ref 70–99)
Potassium: 4.4 mmol/L (ref 3.5–5.1)
Sodium: 136 mmol/L (ref 135–145)
Total Bilirubin: 0.5 mg/dL (ref 0.3–1.2)
Total Protein: 6.9 g/dL (ref 6.5–8.1)

## 2020-04-29 MED ORDER — HEPARIN SOD (PORK) LOCK FLUSH 100 UNIT/ML IV SOLN
500.0000 [IU] | Freq: Once | INTRAVENOUS | Status: AC
  Administered 2020-04-29: 500 [IU] via INTRAVENOUS
  Filled 2020-04-29: qty 5

## 2020-04-29 MED ORDER — SODIUM CHLORIDE 0.9% FLUSH
10.0000 mL | Freq: Once | INTRAVENOUS | Status: AC
  Administered 2020-04-29: 10 mL via INTRAVENOUS
  Filled 2020-04-29: qty 10

## 2020-04-30 LAB — CA 125: Cancer Antigen (CA) 125: 469 U/mL — ABNORMAL HIGH (ref 0.0–38.1)

## 2020-05-05 NOTE — Progress Notes (Signed)
[place]  Telephone:(336) [phone] Fax:(336) [phone]  ID: Tamara Mckinney OB: [birth year]  MR#: [id number]  KGY#:[id number]  Patient Care Team: [name], MD as PCP - General (Family Medicine) [name], RN as Registered Nurse Herbert Pun, MD as Consulting Physician (General Surgery) Lloyd Huger, MD as Consulting Physician (Oncology)  CHIEF COMPLAINT: Stage IIIC ovarian cancer.  INTERVAL HISTORY: Patient returns to clinic today for further evaluation, discussion of her imaging results, and reinitiation of single agent gemcitabine.  She continues to complain of worsening memory.  She also has increased abdominal pain and bloating.  She has no neurologic complaints.  She has a fair appetite and her weight has remained stable.  She denies any chest pain, shortness of breath, cough, or hemoptysis.  She denies any nausea, vomiting, or diarrhea.  She has occasional constipation.  She has no urinary complaints.  Patient offers no further specific complaints today.  REVIEW OF SYSTEMS:   Review of Systems  Constitutional: Positive for malaise/fatigue. Negative for fever and weight loss.  Respiratory: Negative.  Negative for cough and shortness of breath.   Cardiovascular: Negative.  Negative for chest pain and leg swelling.  Gastrointestinal: Positive for abdominal pain and constipation. Negative for blood in stool, diarrhea, heartburn, melena, nausea and vomiting.  Genitourinary: Negative.  Negative for dysuria.  Musculoskeletal: Positive for back pain. Negative for neck pain.  Skin: Negative.  Negative for itching and rash.  Neurological: Positive for weakness. Negative for dizziness and focal weakness.  Endo/Heme/Allergies: Does not bruise/bleed easily.  Psychiatric/Behavioral: Positive for memory loss. The patient is not nervous/anxious.     As per HPI. Otherwise, a complete review of systems is negative.  PAST MEDICAL HISTORY: Past  Medical History:  Diagnosis Date  . Anemia   . Arthritis   . Asthma   . Dyspnea   . GERD (gastroesophageal reflux disease)   . Hypertension   . Ovarian cancer ([place]) 2018    PAST SURGICAL HISTORY: Past Surgical History:  Procedure Laterality Date  . ABDOMINAL HYSTERECTOMY    . BREAST BIOPSY     x6  . BUNIONECTOMY Bilateral   . CATARACT EXTRACTION, BILATERAL    . FLEXIBLE SIGMOIDOSCOPY N/A 05/21/2017   Procedure: FLEXIBLE SIGMOIDOSCOPY;  Surgeon: Lin Landsman, MD;  Location[facility] ENDOSCOPY;  Service: Gastroenterology;  Laterality: N/A;  . INCONTINENCE SURGERY    . PORTA CATH INSERTION N/A 06/16/2017   Procedure: PORTA CATH INSERTION;  Surgeon: Algernon Huxley, MD;  Location: [place] CV LAB;  Service: Cardiovascular;  Laterality: N/A;  . RECTAL SURGERY  04/2018  . REPAIR OF RECTAL PROLAPSE N/A 05/11/2017   Procedure: REPAIR OF RECTAL PROLAPSE;  Surgeon: Leonie Green, MD;  Location: [facility] ORS;  Service: General;  Laterality: N/A;  . TEE WITHOUT CARDIOVERSION N/A 03/22/2020   Procedure: TRANSESOPHAGEAL ECHOCARDIOGRAM (TEE);  Surgeon: Nelva Bush, MD;  Location: [facility] ORS;  Service: Cardiovascular;  Laterality: N/A;  . TONSILLECTOMY    . VAGINA SURGERY     uncertain procedure performed    FAMILY HISTORY: Family History  Problem Relation Age of Onset  . Heart disease Mother   . Heart disease Father   . Heart disease Sister   . Heart attack Sister   . Ulcerative colitis Brother   . Lung cancer Brother   . Thyroid cancer Sister   . Asthma Sister   . Diabetes Sister   . Asthma Sister   . Pancreatic cancer Sister   . Dementia Sister   .  Asthma Brother   . Heart disease Brother   . Asthma Brother   . Lung cancer Brother   . Asthma Brother   . Lung cancer Brother   . Lung cancer Brother   . Rheum arthritis Brother     ADVANCED DIRECTIVES (Y/N):  N  HEALTH MAINTENANCE: Social History   Tobacco Use  . Smoking status: Never Smoker  . Smokeless  tobacco: Never Used  Vaping Use  . Vaping Use: Never used  Substance Use Topics  . Alcohol use: No  . Drug use: No     Colonoscopy:  PAP:  Bone density:  Lipid panel:  No Known Allergies  Current Outpatient Medications  Medication Sig Dispense Refill  . acetaminophen (TYLENOL) 500 MG tablet Take 500-1,000 mg by mouth every 6 (six) hours as needed for mild pain, moderate pain or fever.     . cyclobenzaprine (FLEXERIL) 10 MG tablet TAKE ONE TABLET BY MOUTH THREE TIMES A DAY AS NEEDED (Patient taking differently: Take 10 mg by mouth 3 (three) times daily as needed for muscle spasms. ) 30 tablet 0  . feeding supplement, ENSURE ENLIVE, (ENSURE ENLIVE) LIQD Take 237 mLs by mouth 2 (two) times daily between meals. 237 mL 12  . iron polysaccharides (NIFEREX) 150 MG capsule Take 1 capsule (150 mg total) by mouth daily. 90 capsule 1  . lidocaine-prilocaine (EMLA) cream APPLY A SMALL AMOUNT TO PORT SITE AT LEAST 1 HOUR PRIOR TO IT BEING ACCESSED THEN COVER WITH PLASTIC WRAP 30 g 0  . magic mouthwash w/lidocaine SOLN Take 5 mLs by mouth 4 (four) times daily as needed for mouth pain. 240 mL 1  . montelukast (SINGULAIR) 10 MG tablet Take 10 mg by mouth at bedtime as needed (allergy symptoms).     . ondansetron (ZOFRAN) 4 MG tablet Take 1 tablet (4 mg total) by mouth every 6 (six) hours as needed for nausea. 20 tablet 0  . polyethylene glycol (MIRALAX / GLYCOLAX) packet Take 17 g by mouth daily as needed for mild constipation or moderate constipation.     . valsartan (DIOVAN) 160 MG tablet Take 160 mg by mouth daily.  (Patient not taking: Reported on 05/08/2020)     No current facility-administered medications for this visit.   Facility-Administered Medications Ordered in Other Visits  Medication Dose Route Frequency Provider Last Rate Last Admin  . sodium chloride flush (NS) 0.9 % injection 10 mL  10 mL Intravenous PRN Lloyd Huger, MD   10 mL at 03/10/18 1200    OBJECTIVE: Vitals:    05/09/20 1118  BP: (!) 159/92  Pulse: 88  Resp: 16  Temp: (!) 97.3 F (36.3 C)  SpO2: 100%     Body mass index is 17.67 kg/m.    ECOG FS:0 - Asymptomatic  General: Thin, no acute distress. Eyes: Pink conjunctiva, anicteric sclera. HEENT: Normocephalic, moist mucous membranes. Lungs: No audible wheezing or coughing. Heart: Regular rate and rhythm. Abdomen: Soft, nontender, no obvious distention. Musculoskeletal: No edema, cyanosis, or clubbing. Neuro: Alert, answering all questions appropriately. Cranial nerves grossly intact. Skin: No rashes or petechiae noted. Psych: Normal affect.  LAB RESULTS:  Lab Results  Component Value Date   NA 135 05/09/2020   K 4.5 05/09/2020   CL 101 05/09/2020   CO2 25 05/09/2020   GLUCOSE 90 05/09/2020   BUN 32 (H) 05/09/2020   CREATININE 1.02 (H) 05/09/2020   CALCIUM 8.3 (L) 05/09/2020   PROT 6.7 05/09/2020   ALBUMIN 3.5 05/09/2020  AST 38 05/09/2020   ALT 17 05/09/2020   ALKPHOS 80 05/09/2020   BILITOT 0.5 05/09/2020   GFRNONAA 48 (L) 05/09/2020   GFRAA 56 (L) 05/09/2020    Lab Results  Component Value Date   WBC 9.0 05/09/2020   NEUTROABS 6.9 05/09/2020   HGB 10.3 (L) 05/09/2020   HCT 31.4 (L) 05/09/2020   MCV 89.5 05/09/2020   PLT 305 05/09/2020     STUDIES: CT Abdomen Pelvis W Contrast  Result Date: 05/06/2020 CLINICAL DATA:  Ovarian cancer. Restaging. History of liver abscesses. EXAM: CT ABDOMEN AND PELVIS WITH CONTRAST TECHNIQUE: Multidetector CT imaging of the abdomen and pelvis was performed using the standard protocol following bolus administration of intravenous contrast. CONTRAST:  65mL OMNIPAQUE IOHEXOL 300 MG/ML  SOLN COMPARISON:  03/15/2020 FINDINGS: Lower chest: Unremarkable. Hepatobiliary: Multiple low-density liver lesions again identified. Index lesion in the medial right liver measured previously at 5.7 x 4.7 cm now measures 3.5 x 3.3 cm (21/2). Subcapsular lesion measured previously at 2.9 cm long axis is 2.6  cm today (20/2). A third index lesion in the inferior right liver measured previously at 2.8 cm is now 1.7 cm. A subcapsular lesion in the posterior hepatic dome measures 2.7 x 1.5 cm today compared to 2.0 x 0.9 cm (remeasured) previously. No new liver lesion evident. Tiny layering calcified gallstones evident. Mild distention of the common bile duct is unchanged. Pancreas: Stable appearance of the pancreasz with dominant lesion in the splenic hilum involving the pancreatic tail again noted. This lesion was previously measured at 7.8 x 4.9 cm which compares to 8.6 x 5.6 cm when remeasured at the same level on today's study. Spleen: As above. Adrenals/Urinary Tract: No adrenal nodule or mass. No hydronephrosis or overtly suspicious mass lesion in either kidney. No evidence for hydroureter. The urinary bladder appears normal for the degree of distention. Stomach/Bowel: Moderate to large hiatal hernia is stable. No small bowel wall thickening. No small bowel dilatation. Prominent colonic stool volume. Vascular/Lymphatic: There is abdominal aortic atherosclerosis without aneurysm. Portal vein and superior mesenteric vein are patent. Patency of the splenic vein is not confirmed on this study. There is no gastrohepatic or hepatoduodenal ligament lymphadenopathy. No retroperitoneal or mesenteric lymphadenopathy. 14 mm short axis right common iliac node identified on 39/2, similar to prior. Reproductive: The uterus is surgically absent. 2.7 cm cystic lesion left adnexal space is similar. There is no right adnexal mass. Other: Left retroperitoneal soft tissue mass measures 4.0 x 4.7 cm today compared to 3.7 x 3.9 cm (remeasured) previously. Large cystic mass lesion anterior left lower abdomen/pelvis shows mural nodularity, measuring 12.0 x 9.9 cm today compared to 9.5 x 9.4 cm previously. Musculoskeletal: No worrisome lytic or sclerotic osseous abnormality. IMPRESSION: 1. Multiple hypoattenuating ill-defined liver lesions,  some of which are stable, others of which show interval decrease in size and still others demonstrate interval progression. Findings may reflect a combination of improving liver abscesses and metastatic disease. Multiple large intraabdominal and intrapelvic metastatic lesions show interval progression. 2. Cholelithiasis. 3. Moderate to large hiatal hernia. 4. Aortic Atherosclerosis (ICD10-I70.0). Electronically Signed   By: Misty Stanley M.D.   On: 05/06/2020 13:05    ASSESSMENT: Stage IIIC ovarian cancer.  PLAN:    1. Stage IIIC ovarian cancer: Patient is now considered platinum refractory and is receiving single agent gemcitabine. Plan to give treatment on days 1, 8, and 15 with a 22 off.  Patient last received treatment on March 14, 2020.  CT scan results  from May 06, 2020 reviewed independently and report as above with progression of disease.  Patient's CA-125 has also increased to greater than 400.  We previously discussed discontinuing treatments, but patient wishes to pursue single agent gemcitabine at this time.  Proceed with cycle 7, day 1 of single and gemcitabine today.  Patient return to clinic in 1 week for further evaluation and consideration of cycle 7, day 8.  2.  Liver abscess: Improved per CT scan.  Appreciate infectious disease input.  Patient has now completed IV antibiotics.  Proceed with treatment as above.   3. Lupus anticoagulant: Patient was noted to have an elevated PTT as well as increased bleeding during her surgery for rectal prolapse.   4. Anemia: Chronic and unchanged.  Patient's hemoglobin is 10.3 today.  Previously, iron stores were within normal limits. 5.  Constipation: Continue OTC remedies as needed. 6.  Hyponatremia: Resolved. 7.  Back/abdominal pain: Continue tramadol as needed. Imaging as above. 8.  Rectal bleeding: Likely secondary to hemorrhoids, monitor.  Patient also has a history of rectal prolapse and can consider referral back to surgery if  necessary. 9.  Weakness and fatigue: Chronic and unchanged. 10. Memory: Patient reports she feels her memory is worse since discontinuing antibiotics. It is unlikely patient has recurrent infection. Monitor. 11. Reflux: Continue Nexium as needed.  Patient expressed understanding and was in agreement with this plan. She also understands that She can call clinic at any time with any questions, concerns, or complaints.   Cancer Staging Malignant neoplasm of ovary [facility]) Staging form: Ovary, Fallopian Tube, and Primary Peritoneal Carcinoma, AJCC 8th Edition - Clinical stage from 05/29/2017: Stage IIIC (cT3c, cN1b, cM0) - Signed by Lloyd Huger, MD on 05/29/2017   Lloyd Huger, MD   05/10/2020 10:22 AM

## 2020-05-06 ENCOUNTER — Other Ambulatory Visit: Payer: Self-pay

## 2020-05-06 ENCOUNTER — Ambulatory Visit
Admission: RE | Admit: 2020-05-06 | Discharge: 2020-05-06 | Disposition: A | Discharge status: Home / Self Care | Payer: Medicare PPO | Source: Ambulatory Visit | Attending: Oncology | Admitting: Oncology

## 2020-05-06 DIAGNOSIS — I700 Atherosclerosis of aorta: Secondary | ICD-10-CM | POA: Diagnosis not present

## 2020-05-06 DIAGNOSIS — K449 Diaphragmatic hernia without obstruction or gangrene: Secondary | ICD-10-CM | POA: Insufficient documentation

## 2020-05-06 DIAGNOSIS — K769 Liver disease, unspecified: Secondary | ICD-10-CM | POA: Insufficient documentation

## 2020-05-06 DIAGNOSIS — K8020 Calculus of gallbladder without cholecystitis without obstruction: Secondary | ICD-10-CM | POA: Insufficient documentation

## 2020-05-06 DIAGNOSIS — C569 Malignant neoplasm of unspecified ovary: Secondary | ICD-10-CM | POA: Diagnosis not present

## 2020-05-06 MED ORDER — IOHEXOL 300 MG/ML  SOLN
75.0000 mL | Freq: Once | INTRAMUSCULAR | Status: AC | PRN
  Administered 2020-05-06: 75 mL via INTRAVENOUS

## 2020-05-08 ENCOUNTER — Encounter: Payer: Self-pay | Admitting: Oncology

## 2020-05-08 NOTE — Progress Notes (Signed)
Spoke to patient's son Geoffery Spruce for pre assessment. States patient's memory has still not improved. Currently taking turns with family administering medications for patient. She becomes very forgetful. States patient's appetite has improved since being in hospital and her strength has gotten better. States she has been advised to stop taking her blood pressure medication for a month now.   Patient states today at appointment she is having some pain in lower abdomen along with back pain. Rates pain between 2 - 3.

## 2020-05-09 ENCOUNTER — Encounter: Payer: Self-pay | Admitting: Oncology

## 2020-05-09 ENCOUNTER — Other Ambulatory Visit: Payer: Self-pay

## 2020-05-09 ENCOUNTER — Inpatient Hospital Stay: Payer: Medicare PPO

## 2020-05-09 ENCOUNTER — Inpatient Hospital Stay (HOSPITAL_BASED_OUTPATIENT_CLINIC_OR_DEPARTMENT_OTHER): Payer: Medicare PPO | Admitting: Oncology

## 2020-05-09 VITALS — BP 151/83 | HR 87 | Resp 16

## 2020-05-09 VITALS — BP 159/92 | HR 88 | Temp 97.3°F | Resp 16 | Ht 61.0 in | Wt 93.5 lb

## 2020-05-09 DIAGNOSIS — C569 Malignant neoplasm of unspecified ovary: Secondary | ICD-10-CM | POA: Diagnosis not present

## 2020-05-09 DIAGNOSIS — Z5111 Encounter for antineoplastic chemotherapy: Secondary | ICD-10-CM | POA: Diagnosis not present

## 2020-05-09 LAB — CBC WITH DIFFERENTIAL/PLATELET
Abs Immature Granulocytes: 0.03 10*3/uL (ref 0.00–0.07)
Basophils Absolute: 0.1 10*3/uL (ref 0.0–0.1)
Basophils Relative: 1 %
Eosinophils Absolute: 0 10*3/uL (ref 0.0–0.5)
Eosinophils Relative: 0 %
HCT: 31.4 % — ABNORMAL LOW (ref 36.0–46.0)
Hemoglobin: 10.3 g/dL — ABNORMAL LOW (ref 12.0–15.0)
Immature Granulocytes: 0 %
Lymphocytes Relative: 14 %
Lymphs Abs: 1.3 10*3/uL (ref 0.7–4.0)
MCH: 29.3 pg (ref 26.0–34.0)
MCHC: 32.8 g/dL (ref 30.0–36.0)
MCV: 89.5 fL (ref 80.0–100.0)
Monocytes Absolute: 0.7 10*3/uL (ref 0.1–1.0)
Monocytes Relative: 8 %
Neutro Abs: 6.9 10*3/uL (ref 1.7–7.7)
Neutrophils Relative %: 77 %
Platelets: 305 10*3/uL (ref 150–400)
RBC: 3.51 MIL/uL — ABNORMAL LOW (ref 3.87–5.11)
RDW: 18.1 % — ABNORMAL HIGH (ref 11.5–15.5)
WBC: 9 10*3/uL (ref 4.0–10.5)
nRBC: 0 % (ref 0.0–0.2)

## 2020-05-09 LAB — COMPREHENSIVE METABOLIC PANEL
ALT: 17 U/L (ref 0–44)
AST: 38 U/L (ref 15–41)
Albumin: 3.5 g/dL (ref 3.5–5.0)
Alkaline Phosphatase: 80 U/L (ref 38–126)
Anion gap: 9 (ref 5–15)
BUN: 32 mg/dL — ABNORMAL HIGH (ref 8–23)
CO2: 25 mmol/L (ref 22–32)
Calcium: 8.3 mg/dL — ABNORMAL LOW (ref 8.9–10.3)
Chloride: 101 mmol/L (ref 98–111)
Creatinine, Ser: 1.02 mg/dL — ABNORMAL HIGH (ref 0.44–1.00)
GFR calc Af Amer: 56 mL/min — ABNORMAL LOW (ref >60)
GFR calc non Af Amer: 48 mL/min — ABNORMAL LOW (ref >60)
Glucose, Bld: 90 mg/dL (ref 70–99)
Potassium: 4.5 mmol/L (ref 3.5–5.1)
Sodium: 135 mmol/L (ref 135–145)
Total Bilirubin: 0.5 mg/dL (ref 0.3–1.2)
Total Protein: 6.7 g/dL (ref 6.5–8.1)

## 2020-05-09 MED ORDER — SODIUM CHLORIDE 0.9 % IV SOLN
1000.0000 mg | Freq: Once | INTRAVENOUS | Status: AC
  Administered 2020-05-09: 988.5932 mg via INTRAVENOUS
  Filled 2020-05-09: qty 26

## 2020-05-09 MED ORDER — HEPARIN SOD (PORK) LOCK FLUSH 100 UNIT/ML IV SOLN
INTRAVENOUS | Status: AC
  Filled 2020-05-09: qty 5

## 2020-05-09 MED ORDER — SODIUM CHLORIDE 0.9 % IV SOLN
Freq: Once | INTRAVENOUS | Status: AC
  Filled 2020-05-09: qty 250

## 2020-05-09 MED ORDER — PROCHLORPERAZINE MALEATE 10 MG PO TABS
10.0000 mg | ORAL_TABLET | Freq: Once | ORAL | Status: AC
  Administered 2020-05-09: 10 mg via ORAL
  Filled 2020-05-09: qty 1

## 2020-05-09 MED ORDER — HEPARIN SOD (PORK) LOCK FLUSH 100 UNIT/ML IV SOLN
500.0000 [IU] | Freq: Once | INTRAVENOUS | Status: AC | PRN
  Administered 2020-05-09: 500 [IU]
  Filled 2020-05-09: qty 5

## 2020-05-10 LAB — CA 125: Cancer Antigen (CA) 125: 536 U/mL — ABNORMAL HIGH (ref 0.0–38.1)

## 2020-05-10 NOTE — Progress Notes (Signed)
[name]  [phone] [phone]) [phone]  ID: Tamara Mckinney OB: 1928-08-10  MR#: [id number]  ZDG#:[id number]  Patient Care Team: [name], MD as PCP - General (Family Medicine) [name], RN as Registered Nurse Herbert Pun, MD as Consulting Physician (General Surgery) Lloyd Huger, MD as Consulting Physician (Oncology)  CHIEF COMPLAINT: Stage IIIC ovarian cancer.  INTERVAL HISTORY: Patient returns to clinic today for further evaluation and consideration of cycle 7, day 8 of single agent gemcitabine. She is tolerating her treatments well without significant side effects. She continues to complain of worsening memory. Her abdominal pain and bloating is mildly improved. She has no neurologic complaints.  She has a fair appetite and her weight has remained stable.  She denies any chest pain, shortness of breath, cough, or hemoptysis.  She denies any nausea, vomiting, or diarrhea.  She has occasional constipation.  She has no urinary complaints. Patient offers no further specific complaints today.  REVIEW OF SYSTEMS:   Review of Systems  Constitutional: Positive for malaise/fatigue. Negative for fever and weight loss.  Respiratory: Negative.  Negative for cough and shortness of breath.   Cardiovascular: Negative.  Negative for chest pain and leg swelling.  Gastrointestinal: Positive for abdominal pain, blood in stool and constipation. Negative for diarrhea, heartburn, melena, nausea and vomiting.  Genitourinary: Negative.  Negative for dysuria.  Musculoskeletal: Positive for back pain. Negative for neck pain.  Skin: Negative.  Negative for itching and rash.  Neurological: Positive for weakness. Negative for dizziness and focal weakness.  Endo/Heme/Allergies: Does not bruise/bleed easily.  Psychiatric/Behavioral: Positive for memory loss. The patient is not nervous/anxious.     As per HPI. Otherwise, a complete review of  systems is negative.  PAST MEDICAL HISTORY: Past Medical History:  Diagnosis Date  . Anemia   . Arthritis   . Asthma   . Dyspnea   . GERD (gastroesophageal reflux disease)   . Hypertension   . Ovarian cancer ([place]) 2018    PAST SURGICAL HISTORY: Past Surgical History:  Procedure Laterality Date  . ABDOMINAL HYSTERECTOMY    . BREAST BIOPSY     x6  . BUNIONECTOMY Bilateral   . CATARACT EXTRACTION, BILATERAL    . FLEXIBLE SIGMOIDOSCOPY N/A 05/21/2017   Procedure: FLEXIBLE SIGMOIDOSCOPY;  Surgeon: Lin Landsman, MD;  Location: [facility] ENDOSCOPY;  Service: Gastroenterology;  Laterality: N/A;  . INCONTINENCE SURGERY    . PORTA CATH INSERTION N/A 06/16/2017   Procedure: PORTA CATH INSERTION;  Surgeon: Algernon Huxley, MD;  Location: [facility] LAB;  Service: Cardiovascular;  Laterality: N/A;  . RECTAL SURGERY  04/2018  . REPAIR OF RECTAL PROLAPSE N/A 05/11/2017   Procedure: REPAIR OF RECTAL PROLAPSE;  Surgeon: Leonie Green, MD;  Location: [facility] ORS;  Service: General;  Laterality: N/A;  . TEE WITHOUT CARDIOVERSION N/A 03/22/2020   Procedure: TRANSESOPHAGEAL ECHOCARDIOGRAM (TEE);  Surgeon: Nelva Bush, MD;  Location: [facility] ORS;  Service: Cardiovascular;  Laterality: N/A;  . TONSILLECTOMY    . VAGINA SURGERY     uncertain procedure performed    FAMILY HISTORY: Family History  Problem Relation Age of Onset  . Heart disease Mother   . Heart disease Father   . Heart disease Sister   . Heart attack Sister   . Ulcerative colitis Brother   . Lung cancer Brother   . Thyroid cancer Sister   . Asthma Sister   . Diabetes Sister   . Asthma Sister   . Pancreatic cancer  Sister   . Dementia Sister   . Asthma Brother   . Heart disease Brother   . Asthma Brother   . Lung cancer Brother   . Asthma Brother   . Lung cancer Brother   . Lung cancer Brother   . Rheum arthritis Brother     ADVANCED DIRECTIVES (Y/N):  N  HEALTH MAINTENANCE: Social History   Tobacco Use    . Smoking status: Never Smoker  . Smokeless tobacco: Never Used  Vaping Use  . Vaping Use: Never used  Substance Use Topics  . Alcohol use: No  . Drug use: No     Colonoscopy:  PAP:  Bone density:  Lipid panel:  No Known Allergies  Current Outpatient Medications  Medication Sig Dispense Refill  . acetaminophen (TYLENOL) 500 MG tablet Take 500-1,000 mg by mouth every 6 (six) hours as needed for mild pain, moderate pain or fever.     . cyclobenzaprine (FLEXERIL) 10 MG tablet TAKE ONE TABLET BY MOUTH THREE TIMES A DAY AS NEEDED (Patient taking differently: Take 10 mg by mouth 3 (three) times daily as needed for muscle spasms. ) 30 tablet 0  . feeding supplement, ENSURE ENLIVE, (ENSURE ENLIVE) LIQD Take 237 mLs by mouth 2 (two) times daily between meals. 237 mL 12  . iron polysaccharides (NIFEREX) 150 MG capsule Take 1 capsule (150 mg total) by mouth daily. 90 capsule 1  . lidocaine-prilocaine (EMLA) cream APPLY A SMALL AMOUNT TO PORT SITE AT LEAST 1 HOUR PRIOR TO IT BEING ACCESSED THEN COVER WITH PLASTIC WRAP 30 g 0  . magic mouthwash w/lidocaine SOLN Take 5 mLs by mouth 4 (four) times daily as needed for mouth pain. 240 mL 1  . montelukast (SINGULAIR) 10 MG tablet Take 10 mg by mouth at bedtime as needed (allergy symptoms).     . ondansetron (ZOFRAN) 4 MG tablet Take 1 tablet (4 mg total) by mouth every 6 (six) hours as needed for nausea. 20 tablet 0  . polyethylene glycol (MIRALAX / GLYCOLAX) packet Take 17 g by mouth daily as needed for mild constipation or moderate constipation.     . valsartan (DIOVAN) 160 MG tablet Take 160 mg by mouth daily.  (Patient not taking: Reported on 05/15/2020)     No current facility-administered medications for this visit.   Facility-Administered Medications Ordered in Other Visits  Medication Dose Route Frequency Provider Last Rate Last Admin  . sodium chloride flush (NS) 0.9 % injection 10 mL  10 mL Intravenous PRN Lloyd Huger, MD   10 mL at  03/10/18 1200    OBJECTIVE: Vitals:   05/16/20 0954  BP: (!) 142/85  Pulse: 88  Resp: 18  Temp: 97.6 F (36.4 C)  SpO2: 100%     Body mass index is 17.95 kg/m.    ECOG FS:0 - Asymptomatic  General: Thin, no acute distress. Eyes: Pink conjunctiva, anicteric sclera. HEENT: Normocephalic, moist mucous membranes. Lungs: No audible wheezing or coughing. Heart: Regular rate and rhythm. Abdomen: Soft, nontender, no obvious distention. Musculoskeletal: No edema, cyanosis, or clubbing. Neuro: Alert, answering all questions appropriately. Cranial nerves grossly intact. Skin: No rashes or petechiae noted. Psych: Normal affect.   LAB RESULTS:  Lab Results  Component Value Date   NA 134 (L) 05/16/2020   K 4.5 05/16/2020   CL 100 05/16/2020   CO2 25 05/16/2020   GLUCOSE 110 (H) 05/16/2020   BUN 39 (H) 05/16/2020   CREATININE 0.87 05/16/2020   CALCIUM 8.3 (L)  05/16/2020   PROT 6.4 (L) 05/16/2020   ALBUMIN 3.4 (L) 05/16/2020   AST 30 05/16/2020   ALT 16 05/16/2020   ALKPHOS 78 05/16/2020   BILITOT 0.5 05/16/2020   GFRNONAA 59 (L) 05/16/2020   GFRAA >60 05/16/2020    Lab Results  Component Value Date   WBC 4.3 05/16/2020   NEUTROABS 3.1 05/16/2020   HGB 9.5 (L) 05/16/2020   HCT 28.7 (L) 05/16/2020   MCV 88.3 05/16/2020   PLT 225 05/16/2020     STUDIES: CT Abdomen Pelvis W Contrast  Result Date: 05/06/2020 CLINICAL DATA:  Ovarian cancer. Restaging. History of liver abscesses. EXAM: CT ABDOMEN AND PELVIS WITH CONTRAST TECHNIQUE: Multidetector CT imaging of the abdomen and pelvis was performed using the standard protocol following bolus administration of intravenous contrast. CONTRAST:  50mL OMNIPAQUE IOHEXOL 300 MG/ML  SOLN COMPARISON:  03/15/2020 FINDINGS: Lower chest: Unremarkable. Hepatobiliary: Multiple low-density liver lesions again identified. Index lesion in the medial right liver measured previously at 5.7 x 4.7 cm now measures 3.5 x 3.3 cm (21/2). Subcapsular  lesion measured previously at 2.9 cm long axis is 2.6 cm today (20/2). A third index lesion in the inferior right liver measured previously at 2.8 cm is now 1.7 cm. A subcapsular lesion in the posterior hepatic dome measures 2.7 x 1.5 cm today compared to 2.0 x 0.9 cm (remeasured) previously. No new liver lesion evident. Tiny layering calcified gallstones evident. Mild distention of the common bile duct is unchanged. Pancreas: Stable appearance of the pancreasz with dominant lesion in the splenic hilum involving the pancreatic tail again noted. This lesion was previously measured at 7.8 x 4.9 cm which compares to 8.6 x 5.6 cm when remeasured at the same level on today's study. Spleen: As above. Adrenals/Urinary Tract: No adrenal nodule or mass. No hydronephrosis or overtly suspicious mass lesion in either kidney. No evidence for hydroureter. The urinary bladder appears normal for the degree of distention. Stomach/Bowel: Moderate to large hiatal hernia is stable. No small bowel wall thickening. No small bowel dilatation. Prominent colonic stool volume. Vascular/Lymphatic: There is abdominal aortic atherosclerosis without aneurysm. Portal vein and superior mesenteric vein are patent. Patency of the splenic vein is not confirmed on this study. There is no gastrohepatic or hepatoduodenal ligament lymphadenopathy. No retroperitoneal or mesenteric lymphadenopathy. 14 mm short axis right common iliac node identified on 39/2, similar to prior. Reproductive: The uterus is surgically absent. 2.7 cm cystic lesion left adnexal space is similar. There is no right adnexal mass. Other: Left retroperitoneal soft tissue mass measures 4.0 x 4.7 cm today compared to 3.7 x 3.9 cm (remeasured) previously. Large cystic mass lesion anterior left lower abdomen/pelvis shows mural nodularity, measuring 12.0 x 9.9 cm today compared to 9.5 x 9.4 cm previously. Musculoskeletal: No worrisome lytic or sclerotic osseous abnormality. IMPRESSION: 1.  Multiple hypoattenuating ill-defined liver lesions, some of which are stable, others of which show interval decrease in size and still others demonstrate interval progression. Findings may reflect a combination of improving liver abscesses and metastatic disease. Multiple large intraabdominal and intrapelvic metastatic lesions show interval progression. 2. Cholelithiasis. 3. Moderate to large hiatal hernia. 4. Aortic Atherosclerosis (ICD10-I70.0). Electronically Signed   By: Misty Stanley M.D.   On: 05/06/2020 13:05    ASSESSMENT: Stage IIIC ovarian cancer.  PLAN:    1. Stage IIIC ovarian cancer: Patient is now considered platinum refractory and is receiving single agent gemcitabine. Plan to give treatment on days 1, 8, and 15 with a 22  off. CT scan results from May 06, 2020 reviewed independently with progression of disease.  Patient's CA-125 has also increased to greater than 500.  We previously discussed discontinuing treatments, but patient wishes to continue to pursue single agent gemcitabine at this time. Proceed with cycle 7, day 8 of treatment today. Return to clinic in 1 week for further evaluation and consideration of cycle 7, day 15. 2.  Liver abscess: Improved per CT scan.  Appreciate infectious disease input.  Patient has now completed IV antibiotics.  Proceed with treatment as above.   3. Lupus anticoagulant: Patient was noted to have an elevated PTT as well as increased bleeding during her surgery for rectal prolapse.   4. Anemia: Hemoglobin has trended down to 9.5, monitor. Previously, iron stores were within normal limits. 5.  Constipation: Continue OTC remedies as needed. 6.  Hyponatremia: Mild, monitor. 7.  Back/abdominal pain: Continue tramadol as needed. Imaging as above. 8. Blood in stool: Patient has a history of hemorrhoids as well as rectal prolapse. She was given Hemoccult cards today for further evaluation.  9.  Weakness and fatigue: Chronic and unchanged. 10. Memory:  Chronic and unchanged. 11. Reflux: Continue Nexium as needed.  Patient expressed understanding and was in agreement with this plan. She also understands that She can call clinic at any time with any questions, concerns, or complaints.   Cancer Staging Malignant neoplasm of ovary [facility]) Staging form: Ovary, Fallopian Tube, and Primary Peritoneal Carcinoma, AJCC 8th Edition - Clinical stage from 05/29/2017: Stage IIIC (cT3c, cN1b, cM0) - Signed by Lloyd Huger, MD on 05/29/2017   Lloyd Huger, MD   05/16/2020 4:07 PM

## 2020-05-15 ENCOUNTER — Encounter: Payer: Self-pay | Admitting: Oncology

## 2020-05-15 NOTE — Progress Notes (Signed)
Called and spoke to [facility] for pre assessment. Patient states today at appointment she has been having black stools since she was in hospital. States has been forgetting to let provider know. Reports pain in lower abdomen but states today it is not bad.

## 2020-05-16 ENCOUNTER — Other Ambulatory Visit: Payer: Self-pay

## 2020-05-16 ENCOUNTER — Encounter: Payer: Self-pay | Admitting: Oncology

## 2020-05-16 ENCOUNTER — Inpatient Hospital Stay: Payer: Medicare PPO

## 2020-05-16 ENCOUNTER — Inpatient Hospital Stay (HOSPITAL_BASED_OUTPATIENT_CLINIC_OR_DEPARTMENT_OTHER): Payer: Medicare PPO | Admitting: Oncology

## 2020-05-16 VITALS — BP 142/85 | HR 88 | Temp 97.6°F | Resp 18 | Ht 61.0 in | Wt 95.0 lb

## 2020-05-16 DIAGNOSIS — C569 Malignant neoplasm of unspecified ovary: Secondary | ICD-10-CM

## 2020-05-16 DIAGNOSIS — K921 Melena: Secondary | ICD-10-CM | POA: Diagnosis not present

## 2020-05-16 DIAGNOSIS — Z5111 Encounter for antineoplastic chemotherapy: Secondary | ICD-10-CM | POA: Diagnosis not present

## 2020-05-16 LAB — CBC WITH DIFFERENTIAL/PLATELET
Abs Immature Granulocytes: 0.01 10*3/uL (ref 0.00–0.07)
Basophils Absolute: 0 10*3/uL (ref 0.0–0.1)
Basophils Relative: 1 %
Eosinophils Absolute: 0.1 10*3/uL (ref 0.0–0.5)
Eosinophils Relative: 1 %
HCT: 28.7 % — ABNORMAL LOW (ref 36.0–46.0)
Hemoglobin: 9.5 g/dL — ABNORMAL LOW (ref 12.0–15.0)
Immature Granulocytes: 0 %
Lymphocytes Relative: 21 %
Lymphs Abs: 0.9 10*3/uL (ref 0.7–4.0)
MCH: 29.2 pg (ref 26.0–34.0)
MCHC: 33.1 g/dL (ref 30.0–36.0)
MCV: 88.3 fL (ref 80.0–100.0)
Monocytes Absolute: 0.2 10*3/uL (ref 0.1–1.0)
Monocytes Relative: 6 %
Neutro Abs: 3.1 10*3/uL (ref 1.7–7.7)
Neutrophils Relative %: 71 %
Platelets: 225 10*3/uL (ref 150–400)
RBC: 3.25 MIL/uL — ABNORMAL LOW (ref 3.87–5.11)
RDW: 17 % — ABNORMAL HIGH (ref 11.5–15.5)
WBC: 4.3 10*3/uL (ref 4.0–10.5)
nRBC: 0 % (ref 0.0–0.2)

## 2020-05-16 LAB — COMPREHENSIVE METABOLIC PANEL
ALT: 16 U/L (ref 0–44)
AST: 30 U/L (ref 15–41)
Albumin: 3.4 g/dL — ABNORMAL LOW (ref 3.5–5.0)
Alkaline Phosphatase: 78 U/L (ref 38–126)
Anion gap: 9 (ref 5–15)
BUN: 39 mg/dL — ABNORMAL HIGH (ref 8–23)
CO2: 25 mmol/L (ref 22–32)
Calcium: 8.3 mg/dL — ABNORMAL LOW (ref 8.9–10.3)
Chloride: 100 mmol/L (ref 98–111)
Creatinine, Ser: 0.87 mg/dL (ref 0.44–1.00)
GFR calc Af Amer: >60 mL/min (ref >60)
GFR calc non Af Amer: 59 mL/min — ABNORMAL LOW (ref >60)
Glucose, Bld: 110 mg/dL — ABNORMAL HIGH (ref 70–99)
Potassium: 4.5 mmol/L (ref 3.5–5.1)
Sodium: 134 mmol/L — ABNORMAL LOW (ref 135–145)
Total Bilirubin: 0.5 mg/dL (ref 0.3–1.2)
Total Protein: 6.4 g/dL — ABNORMAL LOW (ref 6.5–8.1)

## 2020-05-16 MED ORDER — HEPARIN SOD (PORK) LOCK FLUSH 100 UNIT/ML IV SOLN
500.0000 [IU] | Freq: Once | INTRAVENOUS | Status: AC | PRN
  Administered 2020-05-16: 500 [IU]
  Filled 2020-05-16: qty 5

## 2020-05-16 MED ORDER — SODIUM CHLORIDE 0.9 % IV SOLN
Freq: Once | INTRAVENOUS | Status: AC
  Filled 2020-05-16: qty 250

## 2020-05-16 MED ORDER — SODIUM CHLORIDE 0.9% FLUSH
10.0000 mL | Freq: Once | INTRAVENOUS | Status: AC
  Administered 2020-05-16: 10 mL via INTRAVENOUS
  Filled 2020-05-16: qty 10

## 2020-05-16 MED ORDER — PROCHLORPERAZINE MALEATE 10 MG PO TABS
10.0000 mg | ORAL_TABLET | Freq: Once | ORAL | Status: AC
  Administered 2020-05-16: 10 mg via ORAL
  Filled 2020-05-16: qty 1

## 2020-05-16 MED ORDER — SODIUM CHLORIDE 0.9 % IV SOLN
1000.0000 mg | Freq: Once | INTRAVENOUS | Status: AC
  Administered 2020-05-16: 988.5932 mg via INTRAVENOUS
  Filled 2020-05-16: qty 26

## 2020-05-16 MED ORDER — HEPARIN SOD (PORK) LOCK FLUSH 100 UNIT/ML IV SOLN
INTRAVENOUS | Status: AC
  Filled 2020-05-16: qty 5

## 2020-05-16 NOTE — Progress Notes (Signed)
[place]  [phone] Fax:(336) [phone]  ID: Tamara Mckinney OB: 12/22/1927  MR#: [id number]  XKG#:[id number]  Patient Care Team: [name], MD as PCP - General (Family Medicine) [name], RN as Registered Nurse Herbert Pun, MD as Consulting Physician (General Surgery) Lloyd Huger, MD as Consulting Physician (Oncology)  CHIEF COMPLAINT: Stage IIIC ovarian cancer.  INTERVAL HISTORY: Patient returns to clinic today for further evaluation and consideration of cycle 7, day 15 of single agent gemcitabine.  She continues to tolerate her treatments well without significant side effects.  She continues to complain of worsening memory.  She has intermittent abdominal pain that is helped with Tylenol only.  She has no neurologic complaints.  She has a fair appetite and her weight has remained stable.  She denies any chest pain, shortness of breath, cough, or hemoptysis.  She denies any nausea, vomiting, or diarrhea.  She has occasional constipation.  She continues to have black tarry stools.  She has no urinary complaints.  Patient offers no further specific complaints today.  REVIEW OF SYSTEMS:   Review of Systems  Constitutional: Positive for malaise/fatigue. Negative for fever and weight loss.  Respiratory: Negative.  Negative for cough and shortness of breath.   Cardiovascular: Negative.  Negative for chest pain and leg swelling.  Gastrointestinal: Positive for abdominal pain, blood in stool and constipation. Negative for diarrhea, heartburn, melena, nausea and vomiting.  Genitourinary: Negative.  Negative for dysuria.  Musculoskeletal: Positive for back pain. Negative for neck pain.  Skin: Negative.  Negative for itching and rash.  Neurological: Positive for weakness. Negative for dizziness and focal weakness.  Endo/Heme/Allergies: Does not bruise/bleed easily.  Psychiatric/Behavioral: Positive for memory loss. The patient is  not nervous/anxious.     As per HPI. Otherwise, a complete review of systems is negative.  PAST MEDICAL HISTORY: Past Medical History:  Diagnosis Date   Anemia    Arthritis    Asthma    Dyspnea    GERD (gastroesophageal reflux disease)    Hypertension    Ovarian cancer ([place]) 2018    PAST SURGICAL HISTORY: Past Surgical History:  Procedure Laterality Date   ABDOMINAL HYSTERECTOMY     BREAST BIOPSY     x6   BUNIONECTOMY Bilateral    CATARACT EXTRACTION, BILATERAL     FLEXIBLE SIGMOIDOSCOPY N/A 05/21/2017   Procedure: FLEXIBLE SIGMOIDOSCOPY;  Surgeon: Lin Landsman, MD;  Location: [facility];  Service: Gastroenterology;  Laterality: N/A;   INCONTINENCE SURGERY     PORTA CATH INSERTION N/A 06/16/2017   Procedure: PORTA CATH INSERTION;  Surgeon: Algernon Huxley, MD;  Location: [facility];  Service: Cardiovascular;  Laterality: N/A;   RECTAL SURGERY  04/2018   REPAIR OF RECTAL PROLAPSE N/A 05/11/2017   Procedure: REPAIR OF RECTAL PROLAPSE;  Surgeon: Leonie Green, MD;  Location: [facility];  Service: General;  Laterality: N/A;   TEE WITHOUT CARDIOVERSION N/A 03/22/2020   Procedure: TRANSESOPHAGEAL ECHOCARDIOGRAM (TEE);  Surgeon: Nelva Bush, MD;  Location: [facility];  Service: Cardiovascular;  Laterality: N/A;   TONSILLECTOMY     VAGINA SURGERY     uncertain procedure performed    FAMILY HISTORY: Family History  Problem Relation Age of Onset   Heart disease Mother    Heart disease Father    Heart disease Sister    Heart attack Sister    Ulcerative colitis Brother    Lung cancer Brother    Thyroid cancer Sister  Asthma Sister    Diabetes Sister    Asthma Sister    Pancreatic cancer Sister    Dementia Sister    Asthma Brother    Heart disease Brother    Asthma Brother    Lung cancer Brother    Asthma Brother    Lung cancer Brother    Lung cancer Brother    Rheum arthritis Brother     ADVANCED  DIRECTIVES (Y/N):  N  HEALTH MAINTENANCE: Social History   Tobacco Use   Smoking status: Never Smoker   Smokeless tobacco: Never Used  Scientific laboratory technician Use: Never used  Substance Use Topics   Alcohol use: No   Drug use: No     Colonoscopy:  PAP:  Bone density:  Lipid panel:  No Known Allergies  Current Outpatient Medications  Medication Sig Dispense Refill   acetaminophen (TYLENOL) 500 MG tablet Take 500-1,000 mg by mouth every 6 (six) hours as needed for mild pain, moderate pain or fever.      cyclobenzaprine (FLEXERIL) 10 MG tablet TAKE ONE TABLET BY MOUTH THREE TIMES A DAY AS NEEDED (Patient taking differently: Take 10 mg by mouth 3 (three) times daily as needed for muscle spasms. ) 30 tablet 0   feeding supplement, ENSURE ENLIVE, (ENSURE ENLIVE) LIQD Take 237 mLs by mouth 2 (two) times daily between meals. 237 mL 12   iron polysaccharides (NIFEREX) 150 MG capsule Take 1 capsule (150 mg total) by mouth daily. 90 capsule 1   lidocaine-prilocaine (EMLA) cream APPLY A SMALL AMOUNT TO PORT SITE AT LEAST 1 HOUR PRIOR TO IT BEING ACCESSED THEN COVER WITH PLASTIC WRAP 30 g 0   magic mouthwash w/lidocaine SOLN Take 5 mLs by mouth 4 (four) times daily as needed for mouth pain. 240 mL 1   montelukast (SINGULAIR) 10 MG tablet Take 10 mg by mouth at bedtime as needed (allergy symptoms).      ondansetron (ZOFRAN) 4 MG tablet Take 1 tablet (4 mg total) by mouth every 6 (six) hours as needed for nausea. 20 tablet 0   polyethylene glycol (MIRALAX / GLYCOLAX) packet Take 17 g by mouth daily as needed for mild constipation or moderate constipation.      valsartan (DIOVAN) 160 MG tablet Take 160 mg by mouth daily.      No current facility-administered medications for this visit.   Facility-Administered Medications Ordered in Other Visits  Medication Dose Route Frequency Provider Last Rate Last Admin   heparin lock flush 100 unit/mL  500 Units Intravenous Once Lloyd Huger, MD       sodium chloride flush (NS) 0.9 % injection 10 mL  10 mL Intravenous PRN Lloyd Huger, MD   10 mL at 03/10/18 1200    OBJECTIVE: Vitals:   05/23/20 1019  BP: (!) 156/84  Pulse: 88  Resp: 20  Temp: 97.8 F (36.6 C)  SpO2: 100%     Body mass index is 17.78 kg/m.    ECOG FS:0 - Asymptomatic  General: Thin, no acute distress. Eyes: Pink conjunctiva, anicteric sclera. HEENT: Normocephalic, moist mucous membranes. Lungs: No audible wheezing or coughing. Heart: Regular rate and rhythm. Abdomen: Soft, nontender, no obvious distention. Musculoskeletal: No edema, cyanosis, or clubbing. Neuro: Alert, answering all questions appropriately. Cranial nerves grossly intact. Skin: No rashes or petechiae noted. Psych: Normal affect.   LAB RESULTS:  Lab Results  Component Value Date   NA 136 05/23/2020   K 4.2 05/23/2020   CL  102 05/23/2020   CO2 26 05/23/2020   GLUCOSE 96 05/23/2020   BUN 31 (H) 05/23/2020   CREATININE 0.73 05/23/2020   CALCIUM 8.3 (L) 05/23/2020   PROT 6.5 05/23/2020   ALBUMIN 3.5 05/23/2020   AST 32 05/23/2020   ALT 16 05/23/2020   ALKPHOS 77 05/23/2020   BILITOT 0.5 05/23/2020   GFRNONAA >60 05/23/2020   GFRAA >60 05/16/2020    Lab Results  Component Value Date   WBC 2.9 (L) 05/23/2020   NEUTROABS 1.8 05/23/2020   HGB 9.1 (L) 05/23/2020   HCT 28.3 (L) 05/23/2020   MCV 89.3 05/23/2020   PLT 137 (L) 05/23/2020     STUDIES: CT Abdomen Pelvis W Contrast  Result Date: 05/06/2020 CLINICAL DATA:  Ovarian cancer. Restaging. History of liver abscesses. EXAM: CT ABDOMEN AND PELVIS WITH CONTRAST TECHNIQUE: Multidetector CT imaging of the abdomen and pelvis was performed using the standard protocol following bolus administration of intravenous contrast. CONTRAST:  79mL OMNIPAQUE IOHEXOL 300 MG/ML  SOLN COMPARISON:  03/15/2020 FINDINGS: Lower chest: Unremarkable. Hepatobiliary: Multiple low-density liver lesions again identified. Index  lesion in the medial right liver measured previously at 5.7 x 4.7 cm now measures 3.5 x 3.3 cm (21/2). Subcapsular lesion measured previously at 2.9 cm long axis is 2.6 cm today (20/2). A third index lesion in the inferior right liver measured previously at 2.8 cm is now 1.7 cm. A subcapsular lesion in the posterior hepatic dome measures 2.7 x 1.5 cm today compared to 2.0 x 0.9 cm (remeasured) previously. No new liver lesion evident. Tiny layering calcified gallstones evident. Mild distention of the common bile duct is unchanged. Pancreas: Stable appearance of the pancreasz with dominant lesion in the splenic hilum involving the pancreatic tail again noted. This lesion was previously measured at 7.8 x 4.9 cm which compares to 8.6 x 5.6 cm when remeasured at the same level on today's study. Spleen: As above. Adrenals/Urinary Tract: No adrenal nodule or mass. No hydronephrosis or overtly suspicious mass lesion in either kidney. No evidence for hydroureter. The urinary bladder appears normal for the degree of distention. Stomach/Bowel: Moderate to large hiatal hernia is stable. No small bowel wall thickening. No small bowel dilatation. Prominent colonic stool volume. Vascular/Lymphatic: There is abdominal aortic atherosclerosis without aneurysm. Portal vein and superior mesenteric vein are patent. Patency of the splenic vein is not confirmed on this study. There is no gastrohepatic or hepatoduodenal ligament lymphadenopathy. No retroperitoneal or mesenteric lymphadenopathy. 14 mm short axis right common iliac node identified on 39/2, similar to prior. Reproductive: The uterus is surgically absent. 2.7 cm cystic lesion left adnexal space is similar. There is no right adnexal mass. Other: Left retroperitoneal soft tissue mass measures 4.0 x 4.7 cm today compared to 3.7 x 3.9 cm (remeasured) previously. Large cystic mass lesion anterior left lower abdomen/pelvis shows mural nodularity, measuring 12.0 x 9.9 cm today  compared to 9.5 x 9.4 cm previously. Musculoskeletal: No worrisome lytic or sclerotic osseous abnormality. IMPRESSION: 1. Multiple hypoattenuating ill-defined liver lesions, some of which are stable, others of which show interval decrease in size and still others demonstrate interval progression. Findings may reflect a combination of improving liver abscesses and metastatic disease. Multiple large intraabdominal and intrapelvic metastatic lesions show interval progression. 2. Cholelithiasis. 3. Moderate to large hiatal hernia. 4. Aortic Atherosclerosis (ICD10-I70.0). Electronically Signed   By: Misty Stanley M.D.   On: 05/06/2020 13:05    ASSESSMENT: Stage IIIC ovarian cancer.  PLAN:    1. Stage IIIC  ovarian cancer: Patient is now considered platinum refractory and is receiving single agent gemcitabine. Plan to give treatment on days 1, 8, and 15 with a 22 off. CT scan results from May 06, 2020 reviewed independently with progression of disease.  Patient's CA-125 initially trended back up to 536, but now has trended down to 428.  We previously discussed discontinuing treatments, but patient wishes to continue to pursue single agent gemcitabine at this time.  Proceed with cycle 7, day 15 of treatment today.  Return to clinic in 2 weeks for further evaluation and consideration of cycle 8, day 1. 2.  Liver abscess: Improved per CT scan.  Appreciate infectious disease input.  Patient has now completed IV antibiotics.  Proceed with treatment as above.   3. Lupus anticoagulant: Patient was noted to have an elevated PTT as well as increased bleeding during her surgery for rectal prolapse.   4. Anemia: Multifactorial including heme positive stools secondary to GI bleed as well as chemotherapy.  Given patient's age and comorbidities, will not refer for EGD or colonoscopy at this time, but if there is a significant bleed or drop in hemoglobin may reconsider.  Globin has trended down to 9.1. 5.  Constipation:  Continue OTC remedies as needed. 6.  Hyponatremia: Resolved. 7.  Back/abdominal pain: Continue tramadol as needed. Imaging as above. 8. Blood in stool: Patient has a history of hemorrhoids as well as rectal prolapse.  Hemoccult cards are positive.  Monitor as above. 9.  Weakness and fatigue: Chronic and unchanged. 10. Memory: Chronic and unchanged. 11. Reflux: Continue Nexium as needed.  Patient expressed understanding and was in agreement with this plan. She also understands that She can call clinic at any time with any questions, concerns, or complaints.   Cancer Staging Malignant neoplasm of ovary [facility] Staging form: Ovary, Fallopian Tube, and Primary Peritoneal Carcinoma, AJCC 8th Edition - Clinical stage from 05/29/2017: Stage IIIC (cT3c, cN1b, cM0) - Signed by Lloyd Huger, MD on 05/29/2017   Lloyd Huger, MD   05/23/2020 10:34 AM

## 2020-05-17 LAB — CA 125: Cancer Antigen (CA) 125: 428 U/mL — ABNORMAL HIGH (ref 0.0–38.1)

## 2020-05-22 ENCOUNTER — Encounter: Payer: Self-pay | Admitting: Oncology

## 2020-05-22 ENCOUNTER — Other Ambulatory Visit: Payer: Self-pay

## 2020-05-22 DIAGNOSIS — K921 Melena: Secondary | ICD-10-CM

## 2020-05-22 LAB — OCCULT BLOOD X 1 CARD TO LAB, STOOL
Fecal Occult Bld: POSITIVE — AB
Fecal Occult Bld: POSITIVE — AB
Fecal Occult Bld: NEGATIVE

## 2020-05-22 NOTE — Progress Notes (Signed)
Called patient for pre assessment. Spoke to patient's son Geoffery Spruce. He denies any pain or new concerns at this time.

## 2020-05-23 ENCOUNTER — Inpatient Hospital Stay: Payer: Medicare PPO | Attending: Oncology

## 2020-05-23 ENCOUNTER — Inpatient Hospital Stay: Payer: Medicare PPO

## 2020-05-23 ENCOUNTER — Other Ambulatory Visit: Payer: Self-pay

## 2020-05-23 ENCOUNTER — Inpatient Hospital Stay (HOSPITAL_BASED_OUTPATIENT_CLINIC_OR_DEPARTMENT_OTHER): Payer: Medicare PPO | Admitting: Oncology

## 2020-05-23 ENCOUNTER — Encounter: Payer: Self-pay | Admitting: Oncology

## 2020-05-23 VITALS — BP 156/84 | HR 88 | Temp 97.8°F | Resp 20 | Wt 94.1 lb

## 2020-05-23 DIAGNOSIS — Z801 Family history of malignant neoplasm of trachea, bronchus and lung: Secondary | ICD-10-CM | POA: Insufficient documentation

## 2020-05-23 DIAGNOSIS — D72819 Decreased white blood cell count, unspecified: Secondary | ICD-10-CM | POA: Insufficient documentation

## 2020-05-23 DIAGNOSIS — D500 Iron deficiency anemia secondary to blood loss (chronic): Secondary | ICD-10-CM | POA: Diagnosis not present

## 2020-05-23 DIAGNOSIS — C569 Malignant neoplasm of unspecified ovary: Secondary | ICD-10-CM | POA: Insufficient documentation

## 2020-05-23 DIAGNOSIS — R109 Unspecified abdominal pain: Secondary | ICD-10-CM | POA: Insufficient documentation

## 2020-05-23 DIAGNOSIS — Z79899 Other long term (current) drug therapy: Secondary | ICD-10-CM | POA: Insufficient documentation

## 2020-05-23 DIAGNOSIS — K921 Melena: Secondary | ICD-10-CM | POA: Diagnosis not present

## 2020-05-23 DIAGNOSIS — R531 Weakness: Secondary | ICD-10-CM | POA: Diagnosis not present

## 2020-05-23 DIAGNOSIS — Z5111 Encounter for antineoplastic chemotherapy: Secondary | ICD-10-CM | POA: Diagnosis not present

## 2020-05-23 DIAGNOSIS — Z8543 Personal history of malignant neoplasm of ovary: Secondary | ICD-10-CM | POA: Diagnosis not present

## 2020-05-23 DIAGNOSIS — K5900 Constipation, unspecified: Secondary | ICD-10-CM | POA: Insufficient documentation

## 2020-05-23 DIAGNOSIS — I10 Essential (primary) hypertension: Secondary | ICD-10-CM | POA: Insufficient documentation

## 2020-05-23 DIAGNOSIS — Z95828 Presence of other vascular implants and grafts: Secondary | ICD-10-CM

## 2020-05-23 DIAGNOSIS — K8020 Calculus of gallbladder without cholecystitis without obstruction: Secondary | ICD-10-CM | POA: Diagnosis not present

## 2020-05-23 DIAGNOSIS — R5383 Other fatigue: Secondary | ICD-10-CM | POA: Diagnosis not present

## 2020-05-23 DIAGNOSIS — K750 Abscess of liver: Secondary | ICD-10-CM | POA: Diagnosis not present

## 2020-05-23 DIAGNOSIS — K219 Gastro-esophageal reflux disease without esophagitis: Secondary | ICD-10-CM | POA: Diagnosis not present

## 2020-05-23 DIAGNOSIS — M1990 Unspecified osteoarthritis, unspecified site: Secondary | ICD-10-CM | POA: Insufficient documentation

## 2020-05-23 DIAGNOSIS — R5381 Other malaise: Secondary | ICD-10-CM | POA: Insufficient documentation

## 2020-05-23 DIAGNOSIS — Z8719 Personal history of other diseases of the digestive system: Secondary | ICD-10-CM | POA: Diagnosis not present

## 2020-05-23 LAB — CBC WITH DIFFERENTIAL/PLATELET
Abs Immature Granulocytes: 0.01 10*3/uL (ref 0.00–0.07)
Basophils Absolute: 0 10*3/uL (ref 0.0–0.1)
Basophils Relative: 1 %
Eosinophils Absolute: 0 10*3/uL (ref 0.0–0.5)
Eosinophils Relative: 1 %
HCT: 28.3 % — ABNORMAL LOW (ref 36.0–46.0)
Hemoglobin: 9.1 g/dL — ABNORMAL LOW (ref 12.0–15.0)
Immature Granulocytes: 0 %
Lymphocytes Relative: 31 %
Lymphs Abs: 0.9 10*3/uL (ref 0.7–4.0)
MCH: 28.7 pg (ref 26.0–34.0)
MCHC: 32.2 g/dL (ref 30.0–36.0)
MCV: 89.3 fL (ref 80.0–100.0)
Monocytes Absolute: 0.1 10*3/uL (ref 0.1–1.0)
Monocytes Relative: 5 %
Neutro Abs: 1.8 10*3/uL (ref 1.7–7.7)
Neutrophils Relative %: 62 %
Platelets: 137 10*3/uL — ABNORMAL LOW (ref 150–400)
RBC: 3.17 MIL/uL — ABNORMAL LOW (ref 3.87–5.11)
RDW: 17 % — ABNORMAL HIGH (ref 11.5–15.5)
WBC: 2.9 10*3/uL — ABNORMAL LOW (ref 4.0–10.5)
nRBC: 0 % (ref 0.0–0.2)

## 2020-05-23 LAB — COMPREHENSIVE METABOLIC PANEL
ALT: 16 U/L (ref 0–44)
AST: 32 U/L (ref 15–41)
Albumin: 3.5 g/dL (ref 3.5–5.0)
Alkaline Phosphatase: 77 U/L (ref 38–126)
Anion gap: 8 (ref 5–15)
BUN: 31 mg/dL — ABNORMAL HIGH (ref 8–23)
CO2: 26 mmol/L (ref 22–32)
Calcium: 8.3 mg/dL — ABNORMAL LOW (ref 8.9–10.3)
Chloride: 102 mmol/L (ref 98–111)
Creatinine, Ser: 0.73 mg/dL (ref 0.44–1.00)
GFR calc non Af Amer: >60 mL/min (ref >60)
Glucose, Bld: 96 mg/dL (ref 70–99)
Potassium: 4.2 mmol/L (ref 3.5–5.1)
Sodium: 136 mmol/L (ref 135–145)
Total Bilirubin: 0.5 mg/dL (ref 0.3–1.2)
Total Protein: 6.5 g/dL (ref 6.5–8.1)

## 2020-05-23 MED ORDER — HEPARIN SOD (PORK) LOCK FLUSH 100 UNIT/ML IV SOLN
500.0000 [IU] | Freq: Once | INTRAVENOUS | Status: AC
  Administered 2020-05-23: 500 [IU] via INTRAVENOUS
  Filled 2020-05-23: qty 5

## 2020-05-23 MED ORDER — SODIUM CHLORIDE 0.9 % IV SOLN
Freq: Once | INTRAVENOUS | Status: AC
  Filled 2020-05-23: qty 250

## 2020-05-23 MED ORDER — SODIUM CHLORIDE 0.9 % IV SOLN
1000.0000 mg | Freq: Once | INTRAVENOUS | Status: AC
  Administered 2020-05-23: 988.5932 mg via INTRAVENOUS
  Filled 2020-05-23: qty 26

## 2020-05-23 MED ORDER — PROCHLORPERAZINE MALEATE 10 MG PO TABS
10.0000 mg | ORAL_TABLET | Freq: Once | ORAL | Status: AC
  Administered 2020-05-23: 10 mg via ORAL
  Filled 2020-05-23: qty 1

## 2020-05-23 MED ORDER — SODIUM CHLORIDE 0.9% FLUSH
10.0000 mL | Freq: Once | INTRAVENOUS | Status: AC
  Administered 2020-05-23: 10 mL via INTRAVENOUS
  Filled 2020-05-23: qty 10

## 2020-05-23 MED ORDER — HEPARIN SOD (PORK) LOCK FLUSH 100 UNIT/ML IV SOLN
500.0000 [IU] | Freq: Once | INTRAVENOUS | Status: DC | PRN
  Filled 2020-05-23: qty 5

## 2020-05-24 LAB — CA 125: Cancer Antigen (CA) 125: 383 U/mL — ABNORMAL HIGH (ref 0.0–38.1)

## 2020-05-31 NOTE — Progress Notes (Signed)
[place]  Telephone:(336) [phone] Fax:(336) [phone]  ID: Tamara Mckinney OB: [birth year]  MR#: [id number]  UYQ#:[id number]  Patient Care Team: [name], MD as PCP - General (Family Medicine) [name], RN as Registered Nurse Herbert Pun, MD as Consulting Physician (General Surgery) Lloyd Huger, MD as Consulting Physician (Oncology)  CHIEF COMPLAINT: Stage IIIC ovarian cancer.  INTERVAL HISTORY: Patient returns to clinic today for further evaluation and consideration of cycle 8, day 1 of single agent gemcitabine.  Other than worsening memory, patient feels well and is at her baseline.  She is tolerating her treatments without significant side effects. She has intermittent abdominal pain that is helped with Tylenol only.  She has no neurologic complaints.  She has a fair appetite and her weight has remained stable.  She denies any chest pain, shortness of breath, cough, or hemoptysis.  She denies any nausea, vomiting, or diarrhea.  She has occasional constipation.  She continues to have black tarry stools.  She has no urinary complaints.  Patient offers no further specific complaints today.  REVIEW OF SYSTEMS:   Review of Systems  Constitutional: Positive for malaise/fatigue. Negative for fever and weight loss.  Respiratory: Negative.  Negative for cough and shortness of breath.   Cardiovascular: Negative.  Negative for chest pain and leg swelling.  Gastrointestinal: Positive for abdominal pain. Negative for blood in stool, constipation, diarrhea, heartburn, melena, nausea and vomiting.  Genitourinary: Negative.  Negative for dysuria.  Musculoskeletal: Positive for back pain. Negative for neck pain.  Skin: Negative.  Negative for itching and rash.  Neurological: Positive for weakness. Negative for dizziness and focal weakness.  Endo/Heme/Allergies: Does not bruise/bleed easily.  Psychiatric/Behavioral: Positive for memory loss. The  patient is not nervous/anxious.     As per HPI. Otherwise, a complete review of systems is negative.  PAST MEDICAL HISTORY: Past Medical History:  Diagnosis Date  . Anemia   . Arthritis   . Asthma   . Dyspnea   . GERD (gastroesophageal reflux disease)   . Hypertension   . Ovarian cancer ([place]) 2018    PAST SURGICAL HISTORY: Past Surgical History:  Procedure Laterality Date  . ABDOMINAL HYSTERECTOMY    . BREAST BIOPSY     x6  . BUNIONECTOMY Bilateral   . CATARACT EXTRACTION, BILATERAL    . FLEXIBLE SIGMOIDOSCOPY N/A 05/21/2017   Procedure: FLEXIBLE SIGMOIDOSCOPY;  Surgeon: Lin Landsman, MD;  Location: [facility];  Service: Gastroenterology;  Laterality: N/A;  . INCONTINENCE SURGERY    . PORTA CATH INSERTION N/A 06/16/2017   Procedure: PORTA CATH INSERTION;  Surgeon: Algernon Huxley, MD;  Location: [facility];  Service: Cardiovascular;  Laterality: N/A;  . RECTAL SURGERY  04/2018  . REPAIR OF RECTAL PROLAPSE N/A 05/11/2017   Procedure: REPAIR OF RECTAL PROLAPSE;  Surgeon: Leonie Green, MD;  Location: [facility];  Service: General;  Laterality: N/A;  . TEE WITHOUT CARDIOVERSION N/A 03/22/2020   Procedure: TRANSESOPHAGEAL ECHOCARDIOGRAM (TEE);  Surgeon: Nelva Bush, MD;  Location: [facility];  Service: Cardiovascular;  Laterality: N/A;  . TONSILLECTOMY    . VAGINA SURGERY     uncertain procedure performed    FAMILY HISTORY: Family History  Problem Relation Age of Onset  . Heart disease Mother   . Heart disease Father   . Heart disease Sister   . Heart attack Sister   . Ulcerative colitis Brother   . Lung cancer Brother   . Thyroid cancer Sister   .  Asthma Sister   . Diabetes Sister   . Asthma Sister   . Pancreatic cancer Sister   . Dementia Sister   . Asthma Brother   . Heart disease Brother   . Asthma Brother   . Lung cancer Brother   . Asthma Brother   . Lung cancer Brother   . Lung cancer Brother   . Rheum arthritis Brother      ADVANCED DIRECTIVES (Y/N):  N  HEALTH MAINTENANCE: Social History   Tobacco Use  . Smoking status: Never Smoker  . Smokeless tobacco: Never Used  Vaping Use  . Vaping Use: Never used  Substance Use Topics  . Alcohol use: No  . Drug use: No     Colonoscopy:  PAP:  Bone density:  Lipid panel:  No Known Allergies  Current Outpatient Medications  Medication Sig Dispense Refill  . acetaminophen (TYLENOL) 500 MG tablet Take 500-1,000 mg by mouth every 6 (six) hours as needed for mild pain, moderate pain or fever.     . cyclobenzaprine (FLEXERIL) 10 MG tablet TAKE ONE TABLET BY MOUTH THREE TIMES A DAY AS NEEDED (Patient taking differently: Take 10 mg by mouth 3 (three) times daily as needed for muscle spasms. ) 30 tablet 0  . feeding supplement, ENSURE ENLIVE, (ENSURE ENLIVE) LIQD Take 237 mLs by mouth 2 (two) times daily between meals. 237 mL 12  . iron polysaccharides (NIFEREX) 150 MG capsule Take 1 capsule (150 mg total) by mouth daily. 90 capsule 1  . lidocaine-prilocaine (EMLA) cream APPLY A SMALL AMOUNT TO PORT SITE AT LEAST 1 HOUR PRIOR TO IT BEING ACCESSED THEN COVER WITH PLASTIC WRAP 30 g 0  . magic mouthwash w/lidocaine SOLN Take 5 mLs by mouth 4 (four) times daily as needed for mouth pain. 240 mL 1  . montelukast (SINGULAIR) 10 MG tablet Take 10 mg by mouth at bedtime as needed (allergy symptoms).     . ondansetron (ZOFRAN) 4 MG tablet Take 1 tablet (4 mg total) by mouth every 6 (six) hours as needed for nausea. 20 tablet 0  . polyethylene glycol (MIRALAX / GLYCOLAX) packet Take 17 g by mouth daily as needed for mild constipation or moderate constipation.     . valsartan (DIOVAN) 160 MG tablet Take 160 mg by mouth daily.      No current facility-administered medications for this visit.   Facility-Administered Medications Ordered in Other Visits  Medication Dose Route Frequency Provider Last Rate Last Admin  . sodium chloride flush (NS) 0.9 % injection 10 mL  10 mL  Intravenous PRN Lloyd Huger, MD   10 mL at 03/10/18 1200    OBJECTIVE: Vitals:   06/06/20 0954  BP: 125/88  Pulse: 86  Resp: 20  Temp: 98.1 F (36.7 C)  SpO2: 100%     Body mass index is 18.06 kg/m.    ECOG FS:0 - Asymptomatic  General: Thin, no acute distress. Eyes: Pink conjunctiva, anicteric sclera. HEENT: Normocephalic, moist mucous membranes. Lungs: No audible wheezing or coughing. Heart: Regular rate and rhythm. Abdomen: Soft, nontender, no obvious distention. Musculoskeletal: No edema, cyanosis, or clubbing. Neuro: Alert, answering all questions appropriately. Cranial nerves grossly intact. Skin: No rashes or petechiae noted. Psych: Normal affect.   LAB RESULTS:  Lab Results  Component Value Date   NA 133 (L) 06/06/2020   K 4.3 06/06/2020   CL 100 06/06/2020   CO2 25 06/06/2020   GLUCOSE 106 (H) 06/06/2020   BUN 28 (H) 06/06/2020  CREATININE 0.97 06/06/2020   CALCIUM 8.1 (L) 06/06/2020   PROT 6.2 (L) 06/06/2020   ALBUMIN 3.4 (L) 06/06/2020   AST 35 06/06/2020   ALT 16 06/06/2020   ALKPHOS 76 06/06/2020   BILITOT 0.4 06/06/2020   GFRNONAA 56 (L) 06/06/2020   GFRAA >60 05/16/2020    Lab Results  Component Value Date   WBC 6.3 06/06/2020   NEUTROABS 4.2 06/06/2020   HGB 8.8 (L) 06/06/2020   HCT 27.4 (L) 06/06/2020   MCV 91.6 06/06/2020   PLT 501 (H) 06/06/2020     STUDIES: No results found.  ASSESSMENT: Stage IIIC ovarian cancer.  PLAN:    1. Stage IIIC ovarian cancer: Patient is now considered platinum refractory and is receiving single agent gemcitabine. Plan to give treatment on days 1, 8, and 15 with a 22 off. CT scan results from May 06, 2020 reviewed independently with progression of disease.  Patient's CA-125 initially trended back up to 536, but now has trended down to 383.  Today's result is pending.  We previously discussed discontinuing treatments, but patient wishes to continue to pursue single agent gemcitabine at this  time.  Proceed with cycle 8, day 1 of treatment today.  Return to clinic in 1 week for further evaluation and consideration of cycle 8, day 8. 2.  Liver abscess: Improved per CT scan.  Appreciate infectious disease input.  Patient has now completed IV antibiotics.  Proceed with treatment as above.   3. Lupus anticoagulant: Patient was noted to have an elevated PTT as well as increased bleeding during her surgery for rectal prolapse.   4. Anemia: Multifactorial including heme positive stools secondary to GI bleed as well as chemotherapy.  Given patient's age and comorbidities, will not refer for EGD or colonoscopy at this time, but if there is a significant bleed or drop in hemoglobin may reconsider.  Hemoglobin has trended down to 8.8.  Monitor.   5.  Constipation: Continue OTC remedies as needed. 6.  Hyponatremia: Mild, monitor. 7.  Back/abdominal pain: Continue tramadol as needed. Imaging as above. 8.  Blood in stool: Patient has a history of hemorrhoids as well as rectal prolapse.  Hemoccult cards are positive.  Monitor as above. 9.  Weakness and fatigue: Chronic and unchanged. 10. Memory: Chronic and unchanged. 11. Reflux: Continue Nexium as needed.  Patient expressed understanding and was in agreement with this plan. She also understands that She can call clinic at any time with any questions, concerns, or complaints.   Cancer Staging Malignant neoplasm of ovary [facility]) Staging form: Ovary, Fallopian Tube, and Primary Peritoneal Carcinoma, AJCC 8th Edition - Clinical stage from 05/29/2017: Stage IIIC (cT3c, cN1b, cM0) - Signed by Lloyd Huger, MD on 05/29/2017   Lloyd Huger, MD   06/06/2020 1:23 PM

## 2020-06-06 ENCOUNTER — Inpatient Hospital Stay (HOSPITAL_BASED_OUTPATIENT_CLINIC_OR_DEPARTMENT_OTHER): Payer: Medicare PPO | Admitting: Oncology

## 2020-06-06 ENCOUNTER — Encounter: Payer: Self-pay | Admitting: Oncology

## 2020-06-06 ENCOUNTER — Inpatient Hospital Stay: Payer: Medicare PPO

## 2020-06-06 ENCOUNTER — Other Ambulatory Visit: Payer: Self-pay

## 2020-06-06 VITALS — BP 125/88 | HR 86 | Temp 98.1°F | Resp 20 | Wt 95.6 lb

## 2020-06-06 DIAGNOSIS — Z5111 Encounter for antineoplastic chemotherapy: Secondary | ICD-10-CM | POA: Diagnosis not present

## 2020-06-06 DIAGNOSIS — C569 Malignant neoplasm of unspecified ovary: Secondary | ICD-10-CM

## 2020-06-06 LAB — COMPREHENSIVE METABOLIC PANEL
ALT: 16 U/L (ref 0–44)
AST: 35 U/L (ref 15–41)
Albumin: 3.4 g/dL — ABNORMAL LOW (ref 3.5–5.0)
Alkaline Phosphatase: 76 U/L (ref 38–126)
Anion gap: 8 (ref 5–15)
BUN: 28 mg/dL — ABNORMAL HIGH (ref 8–23)
CO2: 25 mmol/L (ref 22–32)
Calcium: 8.1 mg/dL — ABNORMAL LOW (ref 8.9–10.3)
Chloride: 100 mmol/L (ref 98–111)
Creatinine, Ser: 0.97 mg/dL (ref 0.44–1.00)
GFR, Estimated: 56 mL/min — ABNORMAL LOW (ref >60)
Glucose, Bld: 106 mg/dL — ABNORMAL HIGH (ref 70–99)
Potassium: 4.3 mmol/L (ref 3.5–5.1)
Sodium: 133 mmol/L — ABNORMAL LOW (ref 135–145)
Total Bilirubin: 0.4 mg/dL (ref 0.3–1.2)
Total Protein: 6.2 g/dL — ABNORMAL LOW (ref 6.5–8.1)

## 2020-06-06 LAB — CBC WITH DIFFERENTIAL/PLATELET
Abs Immature Granulocytes: 0.02 10*3/uL (ref 0.00–0.07)
Basophils Absolute: 0 10*3/uL (ref 0.0–0.1)
Basophils Relative: 0 %
Eosinophils Absolute: 0 10*3/uL (ref 0.0–0.5)
Eosinophils Relative: 0 %
HCT: 27.4 % — ABNORMAL LOW (ref 36.0–46.0)
Hemoglobin: 8.8 g/dL — ABNORMAL LOW (ref 12.0–15.0)
Immature Granulocytes: 0 %
Lymphocytes Relative: 17 %
Lymphs Abs: 1 10*3/uL (ref 0.7–4.0)
MCH: 29.4 pg (ref 26.0–34.0)
MCHC: 32.1 g/dL (ref 30.0–36.0)
MCV: 91.6 fL (ref 80.0–100.0)
Monocytes Absolute: 0.9 10*3/uL (ref 0.1–1.0)
Monocytes Relative: 15 %
Neutro Abs: 4.2 10*3/uL (ref 1.7–7.7)
Neutrophils Relative %: 68 %
Platelets: 501 10*3/uL — ABNORMAL HIGH (ref 150–400)
RBC: 2.99 MIL/uL — ABNORMAL LOW (ref 3.87–5.11)
RDW: 18.7 % — ABNORMAL HIGH (ref 11.5–15.5)
WBC: 6.3 10*3/uL (ref 4.0–10.5)
nRBC: 0 % (ref 0.0–0.2)

## 2020-06-06 MED ORDER — LIDOCAINE-PRILOCAINE 2.5-2.5 % EX CREA
TOPICAL_CREAM | CUTANEOUS | 0 refills | Status: AC
Start: 2020-06-06 — End: ?

## 2020-06-06 MED ORDER — SODIUM CHLORIDE 0.9 % IV SOLN
1000.0000 mg | Freq: Once | INTRAVENOUS | Status: AC
  Administered 2020-06-06: 988.5932 mg via INTRAVENOUS
  Filled 2020-06-06: qty 26

## 2020-06-06 MED ORDER — HEPARIN SOD (PORK) LOCK FLUSH 100 UNIT/ML IV SOLN
INTRAVENOUS | Status: AC
  Filled 2020-06-06: qty 5

## 2020-06-06 MED ORDER — HEPARIN SOD (PORK) LOCK FLUSH 100 UNIT/ML IV SOLN
500.0000 [IU] | Freq: Once | INTRAVENOUS | Status: AC | PRN
  Administered 2020-06-06: 500 [IU]
  Filled 2020-06-06: qty 5

## 2020-06-06 MED ORDER — PROCHLORPERAZINE MALEATE 10 MG PO TABS
10.0000 mg | ORAL_TABLET | Freq: Once | ORAL | Status: AC
  Administered 2020-06-06: 10 mg via ORAL
  Filled 2020-06-06: qty 1

## 2020-06-06 MED ORDER — SODIUM CHLORIDE 0.9 % IV SOLN
Freq: Once | INTRAVENOUS | Status: AC
  Filled 2020-06-06: qty 250

## 2020-06-06 NOTE — Progress Notes (Signed)
Patient and son here today for follow up. Son would like to know if patient is elligible for covid booster shot. Also request refill on emla cream. Patient states she is feeling very tired but denies other concerns at this time.

## 2020-06-07 LAB — CA 125: Cancer Antigen (CA) 125: 282 U/mL — ABNORMAL HIGH (ref 0.0–38.1)

## 2020-06-07 NOTE — Progress Notes (Signed)
[place]  [phone] [phone]) [phone]  ID: [name] OB: [birth year]  MR#: [id number]  KGU#:[id number]  Patient Care Team: [name], MD as PCP - General (Family Medicine) [name], RN as Registered Nurse Herbert Pun, MD as Consulting Physician (General Surgery) Lloyd Huger, MD as Consulting Physician (Oncology)  CHIEF COMPLAINT: Stage IIIC ovarian cancer.  INTERVAL HISTORY: Patient returns to clinic today for further evaluation and consideration of cycle 8, day 8 of single agent gemcitabine.  She continues to complain of poor memory, but otherwise feels well. She is tolerating her treatments without significant side effects. She has intermittent abdominal pain that is helped with Tylenol only.  She has no neurologic complaints.  She has a fair appetite and her weight has remained stable.  She denies any chest pain, shortness of breath, cough, or hemoptysis.  She denies any nausea, vomiting, or diarrhea.  She has occasional constipation.  She continues to have black tarry stools.  She has no urinary complaints.  Patient offers no further specific complaints today.  REVIEW OF SYSTEMS:   Review of Systems  Constitutional: Negative.  Negative for fever, malaise/fatigue and weight loss.  Respiratory: Negative.  Negative for cough and shortness of breath.   Cardiovascular: Negative.  Negative for chest pain and leg swelling.  Gastrointestinal: Positive for abdominal pain. Negative for blood in stool, constipation, diarrhea, heartburn, melena, nausea and vomiting.  Genitourinary: Negative.  Negative for dysuria.  Musculoskeletal: Positive for back pain. Negative for neck pain.  Skin: Negative.  Negative for itching and rash.  Neurological: Negative.  Negative for dizziness, focal weakness and weakness.  Endo/Heme/Allergies: Does not bruise/bleed easily.  Psychiatric/Behavioral: Positive for memory loss. The patient is not  nervous/anxious.     As per HPI. Otherwise, a complete review of systems is negative.  PAST MEDICAL HISTORY: Past Medical History:  Diagnosis Date  . Anemia   . Arthritis   . Asthma   . Dyspnea   . GERD (gastroesophageal reflux disease)   . Hypertension   . Ovarian cancer ([place]) 2018    PAST SURGICAL HISTORY: Past Surgical History:  Procedure Laterality Date  . ABDOMINAL HYSTERECTOMY    . BREAST BIOPSY     x6  . BUNIONECTOMY Bilateral   . CATARACT EXTRACTION, BILATERAL    . FLEXIBLE SIGMOIDOSCOPY N/A 05/21/2017   Procedure: FLEXIBLE SIGMOIDOSCOPY;  Surgeon: Lin Landsman, MD;  Location: [facility] ENDOSCOPY;  Service: Gastroenterology;  Laterality: N/A;  . INCONTINENCE SURGERY    . PORTA CATH INSERTION N/A 06/16/2017   Procedure: PORTA CATH INSERTION;  Surgeon: Algernon Huxley, MD;  Location: [place] CV LAB;  Service: Cardiovascular;  Laterality: N/A;  . RECTAL SURGERY  04/2018  . REPAIR OF RECTAL PROLAPSE N/A 05/11/2017   Procedure: REPAIR OF RECTAL PROLAPSE;  Surgeon: Leonie Green, MD;  Location: [facility] ORS;  Service: General;  Laterality: N/A;  . TEE WITHOUT CARDIOVERSION N/A 03/22/2020   Procedure: TRANSESOPHAGEAL ECHOCARDIOGRAM (TEE);  Surgeon: Nelva Bush, MD;  Location: [facility] ORS;  Service: Cardiovascular;  Laterality: N/A;  . TONSILLECTOMY    . VAGINA SURGERY     uncertain procedure performed    FAMILY HISTORY: Family History  Problem Relation Age of Onset  . Heart disease Mother   . Heart disease Father   . Heart disease Sister   . Heart attack Sister   . Ulcerative colitis Brother   . Lung cancer Brother   . Thyroid cancer Sister   . Asthma  Sister   . Diabetes Sister   . Asthma Sister   . Pancreatic cancer Sister   . Dementia Sister   . Asthma Brother   . Heart disease Brother   . Asthma Brother   . Lung cancer Brother   . Asthma Brother   . Lung cancer Brother   . Lung cancer Brother   . Rheum arthritis Brother     ADVANCED DIRECTIVES  (Y/N):  N  HEALTH MAINTENANCE: Social History   Tobacco Use  . Smoking status: Never Smoker  . Smokeless tobacco: Never Used  Vaping Use  . Vaping Use: Never used  Substance Use Topics  . Alcohol use: No  . Drug use: No     Colonoscopy:  PAP:  Bone density:  Lipid panel:  No Known Allergies  Current Outpatient Medications  Medication Sig Dispense Refill  . acetaminophen (TYLENOL) 500 MG tablet Take 500-1,000 mg by mouth every 6 (six) hours as needed for mild pain, moderate pain or fever.     . cyclobenzaprine (FLEXERIL) 10 MG tablet TAKE ONE TABLET BY MOUTH THREE TIMES A DAY AS NEEDED (Patient taking differently: Take 10 mg by mouth 3 (three) times daily as needed for muscle spasms. ) 30 tablet 0  . feeding supplement, ENSURE ENLIVE, (ENSURE ENLIVE) LIQD Take 237 mLs by mouth 2 (two) times daily between meals. 237 mL 12  . iron polysaccharides (NIFEREX) 150 MG capsule Take 1 capsule (150 mg total) by mouth daily. 90 capsule 1  . lidocaine-prilocaine (EMLA) cream APPLY A SMALL AMOUNT TO PORT SITE AT LEAST 1 HOUR PRIOR TO IT BEING ACCESSED THEN COVER WITH PLASTIC WRAP 30 g 0  . magic mouthwash w/lidocaine SOLN Take 5 mLs by mouth 4 (four) times daily as needed for mouth pain. 240 mL 1  . montelukast (SINGULAIR) 10 MG tablet Take 10 mg by mouth at bedtime as needed (allergy symptoms).     . ondansetron (ZOFRAN) 4 MG tablet Take 1 tablet (4 mg total) by mouth every 6 (six) hours as needed for nausea. 20 tablet 0  . polyethylene glycol (MIRALAX / GLYCOLAX) packet Take 17 g by mouth daily as needed for mild constipation or moderate constipation.     . valsartan (DIOVAN) 160 MG tablet Take 160 mg by mouth daily.      No current facility-administered medications for this visit.   Facility-Administered Medications Ordered in Other Visits  Medication Dose Route Frequency Provider Last Rate Last Admin  . sodium chloride flush (NS) 0.9 % injection 10 mL  10 mL Intravenous PRN Lloyd Huger, MD   10 mL at 03/10/18 1200    OBJECTIVE: Vitals:   06/13/20 1006  BP: (!) 142/76  Pulse: 91  Resp: 20  Temp: (!) 97.2 F (36.2 C)  SpO2: 100%     Body mass index is 17.7 kg/m.    ECOG FS:0 - Asymptomatic  General: Thin, no acute distress. Eyes: Pink conjunctiva, anicteric sclera. HEENT: Normocephalic, moist mucous membranes. Lungs: No audible wheezing or coughing. Heart: Regular rate and rhythm. Abdomen: Soft, nontender, no obvious distention. Musculoskeletal: No edema, cyanosis, or clubbing. Neuro: Alert, answering all questions appropriately. Cranial nerves grossly intact. Skin: No rashes or petechiae noted. Psych: Normal affect.    LAB RESULTS:  Lab Results  Component Value Date   NA 135 06/13/2020   K 4.3 06/13/2020   CL 101 06/13/2020   CO2 26 06/13/2020   GLUCOSE 137 (H) 06/13/2020   BUN 30 (H) 06/13/2020  CREATININE 0.93 06/13/2020   CALCIUM 8.6 (L) 06/13/2020   PROT 6.5 06/13/2020   ALBUMIN 3.5 06/13/2020   AST 36 06/13/2020   ALT 18 06/13/2020   ALKPHOS 75 06/13/2020   BILITOT 0.4 06/13/2020   GFRNONAA 58 (L) 06/13/2020   GFRAA >60 05/16/2020    Lab Results  Component Value Date   WBC 3.3 (L) 06/13/2020   NEUTROABS 2.1 06/13/2020   HGB 9.1 (L) 06/13/2020   HCT 28.0 (L) 06/13/2020   MCV 92.1 06/13/2020   PLT 457 (H) 06/13/2020     STUDIES: No results found.  ASSESSMENT: Stage IIIC ovarian cancer.  PLAN:    1. Stage IIIC ovarian cancer: Patient is now considered platinum refractory and is receiving single agent gemcitabine. Plan to give treatment on days 1, 8, and 15 with a 22 off. CT scan results from May 06, 2020 reviewed independently with progression of disease.  Patient's CA-125 initially trended back up to 536, but continues to trend down and is now 230. We previously discussed discontinuing treatments, but patient wishes to continue to pursue single agent gemcitabine at this time.  Proceed with cycle 8, day 8 of  treatment today.  Return to clinic in 1 week for further evaluation and consideration of cycle 8, day 15.  2.  Liver abscess: Improved per CT scan.  Appreciate infectious disease input.  Patient has now completed IV antibiotics.  Proceed with treatment as above.   3. Lupus anticoagulant: Patient was noted to have an elevated PTT as well as increased bleeding during her surgery for rectal prolapse.   4. Anemia: Multifactorial including heme positive stools secondary to GI bleed as well as chemotherapy.  Given patient's age and comorbidities, will not refer for EGD or colonoscopy at this time, but if there is a significant bleed or drop in hemoglobin may reconsider.  Hemoglobin remains essentially unchanged at 9.1. 5.  Constipation: Continue OTC remedies as needed. 6.  Hyponatremia: Resolved. 7.  Back/abdominal pain: Continue tramadol as needed. Imaging as above. 8.  Blood in stool: Patient has a history of hemorrhoids as well as rectal prolapse.  Hemoccult cards are positive, but repeat testing was negative.  Continue to monitor closely. 9.  Weakness and fatigue: Chronic and unchanged. 10. Memory: Chronic and unchanged. 11. Reflux: Continue Nexium as needed. 12.  Leukopenia: Mild, monitor.  Proceed with treatment as above.  Patient expressed understanding and was in agreement with this plan. She also understands that She can call clinic at any time with any questions, concerns, or complaints.   Cancer Staging Malignant neoplasm of ovary [facility]) Staging form: Ovary, Fallopian Tube, and Primary Peritoneal Carcinoma, AJCC 8th Edition - Clinical stage from 05/29/2017: Stage IIIC (cT3c, cN1b, cM0) - Signed by Lloyd Huger, MD on 05/29/2017   Lloyd Huger, MD   06/14/2020 9:52 AM

## 2020-06-12 ENCOUNTER — Encounter: Payer: Self-pay | Admitting: Oncology

## 2020-06-12 NOTE — Progress Notes (Signed)
Spoke to patient's son during pre assessment. He states there have been no changes and no concerns at this time.

## 2020-06-13 ENCOUNTER — Inpatient Hospital Stay (HOSPITAL_BASED_OUTPATIENT_CLINIC_OR_DEPARTMENT_OTHER): Payer: Medicare PPO | Admitting: Oncology

## 2020-06-13 ENCOUNTER — Inpatient Hospital Stay: Payer: Medicare PPO

## 2020-06-13 ENCOUNTER — Other Ambulatory Visit: Payer: Self-pay

## 2020-06-13 ENCOUNTER — Encounter: Payer: Self-pay | Admitting: Oncology

## 2020-06-13 VITALS — BP 142/76 | HR 91 | Temp 97.2°F | Resp 20 | Wt 93.7 lb

## 2020-06-13 DIAGNOSIS — C569 Malignant neoplasm of unspecified ovary: Secondary | ICD-10-CM

## 2020-06-13 DIAGNOSIS — Z5111 Encounter for antineoplastic chemotherapy: Secondary | ICD-10-CM | POA: Diagnosis not present

## 2020-06-13 LAB — CBC WITH DIFFERENTIAL/PLATELET
Abs Immature Granulocytes: 0.01 10*3/uL (ref 0.00–0.07)
Basophils Absolute: 0 10*3/uL (ref 0.0–0.1)
Basophils Relative: 1 %
Eosinophils Absolute: 0 10*3/uL (ref 0.0–0.5)
Eosinophils Relative: 0 %
HCT: 28 % — ABNORMAL LOW (ref 36.0–46.0)
Hemoglobin: 9.1 g/dL — ABNORMAL LOW (ref 12.0–15.0)
Immature Granulocytes: 0 %
Lymphocytes Relative: 25 %
Lymphs Abs: 0.8 10*3/uL (ref 0.7–4.0)
MCH: 29.9 pg (ref 26.0–34.0)
MCHC: 32.5 g/dL (ref 30.0–36.0)
MCV: 92.1 fL (ref 80.0–100.0)
Monocytes Absolute: 0.3 10*3/uL (ref 0.1–1.0)
Monocytes Relative: 9 %
Neutro Abs: 2.1 10*3/uL (ref 1.7–7.7)
Neutrophils Relative %: 65 %
Platelets: 457 10*3/uL — ABNORMAL HIGH (ref 150–400)
RBC: 3.04 MIL/uL — ABNORMAL LOW (ref 3.87–5.11)
RDW: 18.5 % — ABNORMAL HIGH (ref 11.5–15.5)
WBC: 3.3 10*3/uL — ABNORMAL LOW (ref 4.0–10.5)
nRBC: 0 % (ref 0.0–0.2)

## 2020-06-13 LAB — COMPREHENSIVE METABOLIC PANEL
ALT: 18 U/L (ref 0–44)
AST: 36 U/L (ref 15–41)
Albumin: 3.5 g/dL (ref 3.5–5.0)
Alkaline Phosphatase: 75 U/L (ref 38–126)
Anion gap: 8 (ref 5–15)
BUN: 30 mg/dL — ABNORMAL HIGH (ref 8–23)
CO2: 26 mmol/L (ref 22–32)
Calcium: 8.6 mg/dL — ABNORMAL LOW (ref 8.9–10.3)
Chloride: 101 mmol/L (ref 98–111)
Creatinine, Ser: 0.93 mg/dL (ref 0.44–1.00)
GFR, Estimated: 58 mL/min — ABNORMAL LOW (ref >60)
Glucose, Bld: 137 mg/dL — ABNORMAL HIGH (ref 70–99)
Potassium: 4.3 mmol/L (ref 3.5–5.1)
Sodium: 135 mmol/L (ref 135–145)
Total Bilirubin: 0.4 mg/dL (ref 0.3–1.2)
Total Protein: 6.5 g/dL (ref 6.5–8.1)

## 2020-06-13 MED ORDER — SODIUM CHLORIDE 0.9 % IV SOLN
Freq: Once | INTRAVENOUS | Status: AC
  Filled 2020-06-13: qty 250

## 2020-06-13 MED ORDER — HEPARIN SOD (PORK) LOCK FLUSH 100 UNIT/ML IV SOLN
INTRAVENOUS | Status: AC
  Filled 2020-06-13: qty 5

## 2020-06-13 MED ORDER — SODIUM CHLORIDE 0.9 % IV SOLN
1000.0000 mg | Freq: Once | INTRAVENOUS | Status: AC
  Administered 2020-06-13: 988.5932 mg via INTRAVENOUS
  Filled 2020-06-13: qty 26

## 2020-06-13 MED ORDER — PROCHLORPERAZINE MALEATE 10 MG PO TABS
10.0000 mg | ORAL_TABLET | Freq: Once | ORAL | Status: AC
  Administered 2020-06-13: 10 mg via ORAL
  Filled 2020-06-13: qty 1

## 2020-06-13 MED ORDER — HEPARIN SOD (PORK) LOCK FLUSH 100 UNIT/ML IV SOLN
500.0000 [IU] | Freq: Once | INTRAVENOUS | Status: AC | PRN
  Administered 2020-06-13: 500 [IU]
  Filled 2020-06-13: qty 5

## 2020-06-13 NOTE — Progress Notes (Signed)
Pt evaluated prior to tx by MD. Treatment given as ordered . Completed without incident. Pt discharged stable. Return as scheduled.

## 2020-06-14 LAB — CA 125: Cancer Antigen (CA) 125: 230 U/mL — ABNORMAL HIGH (ref 0.0–38.1)

## 2020-06-15 NOTE — Progress Notes (Signed)
[place]  Telephone:(336) [phone] Fax:(336) [phone]  ID: Tamara Mckinney OB: 1927/10/22  MR#: [id number]  VZC#:[id number]  Patient Care Team: [name], MD as PCP - General (Family Medicine) [name], RN as Registered Nurse Herbert Pun, MD as Consulting Physician (General Surgery) Lloyd Huger, MD as Consulting Physician (Oncology)  CHIEF COMPLAINT: Stage IIIC ovarian cancer.  INTERVAL HISTORY: Patient returns to clinic today for further evaluation and consideration of cycle 8, day 15 of single agent gemcitabine.  She continues to complain of poor memory, but otherwise feels well.  She has occasional constipation.  She also has occasional abdominal pain which is well controlled with Tylenol.  She has no neurologic complaints.  She has a fair appetite and her weight has remained stable.  She denies any chest pain, shortness of breath, cough, or hemoptysis.  She denies any nausea, vomiting, or diarrhea.  She does not complain of black tarry stools today.  She has no urinary complaints.  Patient offers no further specific complaints today.  REVIEW OF SYSTEMS:   Review of Systems  Constitutional: Negative.  Negative for fever, malaise/fatigue and weight loss.  Respiratory: Negative.  Negative for cough and shortness of breath.   Cardiovascular: Negative.  Negative for chest pain and leg swelling.  Gastrointestinal: Positive for abdominal pain and constipation. Negative for blood in stool, diarrhea, heartburn, melena, nausea and vomiting.  Genitourinary: Negative.  Negative for dysuria.  Musculoskeletal: Positive for back pain. Negative for neck pain.  Skin: Negative.  Negative for itching and rash.  Neurological: Negative.  Negative for dizziness, focal weakness and weakness.  Endo/Heme/Allergies: Does not bruise/bleed easily.  Psychiatric/Behavioral: Positive for memory loss. The patient is not nervous/anxious.     As per HPI.  Otherwise, a complete review of systems is negative.  PAST MEDICAL HISTORY: Past Medical History:  Diagnosis Date  . Anemia   . Arthritis   . Asthma   . Dyspnea   . GERD (gastroesophageal reflux disease)   . Hypertension   . Ovarian cancer ([place]) 2018    PAST SURGICAL HISTORY: Past Surgical History:  Procedure Laterality Date  . ABDOMINAL HYSTERECTOMY    . BREAST BIOPSY     x6  . BUNIONECTOMY Bilateral   . CATARACT EXTRACTION, BILATERAL    . FLEXIBLE SIGMOIDOSCOPY N/A 05/21/2017   Procedure: FLEXIBLE SIGMOIDOSCOPY;  Surgeon: Lin Landsman, MD;  Location[facility] ENDOSCOPY;  Service: Gastroenterology;  Laterality: N/A;  . INCONTINENCE SURGERY    . PORTA CATH INSERTION N/A 06/16/2017   Procedure: PORTA CATH INSERTION;  Surgeon: Algernon Huxley, MD;  Location: [place] CV LAB;  Service: Cardiovascular;  Laterality: N/A;  . RECTAL SURGERY  04/2018  . REPAIR OF RECTAL PROLAPSE N/A 05/11/2017   Procedure: REPAIR OF RECTAL PROLAPSE;  Surgeon: Leonie Green, MD;  Location: [facility] ORS;  Service: General;  Laterality: N/A;  . TEE WITHOUT CARDIOVERSION N/A 03/22/2020   Procedure: TRANSESOPHAGEAL ECHOCARDIOGRAM (TEE);  Surgeon: Nelva Bush, MD;  Location: [facility] ORS;  Service: Cardiovascular;  Laterality: N/A;  . TONSILLECTOMY    . VAGINA SURGERY     uncertain procedure performed    FAMILY HISTORY: Family History  Problem Relation Age of Onset  . Heart disease Mother   . Heart disease Father   . Heart disease Sister   . Heart attack Sister   . Ulcerative colitis Brother   . Lung cancer Brother   . Thyroid cancer Sister   . Asthma Sister   .  Diabetes Sister   . Asthma Sister   . Pancreatic cancer Sister   . Dementia Sister   . Asthma Brother   . Heart disease Brother   . Asthma Brother   . Lung cancer Brother   . Asthma Brother   . Lung cancer Brother   . Lung cancer Brother   . Rheum arthritis Brother     ADVANCED DIRECTIVES (Y/N):  N  HEALTH  MAINTENANCE: Social History   Tobacco Use  . Smoking status: Never Smoker  . Smokeless tobacco: Never Used  Vaping Use  . Vaping Use: Never used  Substance Use Topics  . Alcohol use: No  . Drug use: No     Colonoscopy:  PAP:  Bone density:  Lipid panel:  No Known Allergies  Current Outpatient Medications  Medication Sig Dispense Refill  . acetaminophen (TYLENOL) 500 MG tablet Take 500-1,000 mg by mouth every 6 (six) hours as needed for mild pain, moderate pain or fever.     . cyclobenzaprine (FLEXERIL) 10 MG tablet TAKE ONE TABLET BY MOUTH THREE TIMES A DAY AS NEEDED (Patient taking differently: Take 10 mg by mouth 3 (three) times daily as needed for muscle spasms. ) 30 tablet 0  . feeding supplement, ENSURE ENLIVE, (ENSURE ENLIVE) LIQD Take 237 mLs by mouth 2 (two) times daily between meals. 237 mL 12  . iron polysaccharides (NIFEREX) 150 MG capsule Take 1 capsule (150 mg total) by mouth daily. 90 capsule 1  . lidocaine-prilocaine (EMLA) cream APPLY A SMALL AMOUNT TO PORT SITE AT LEAST 1 HOUR PRIOR TO IT BEING ACCESSED THEN COVER WITH PLASTIC WRAP 30 g 0  . magic mouthwash w/lidocaine SOLN Take 5 mLs by mouth 4 (four) times daily as needed for mouth pain. 240 mL 1  . montelukast (SINGULAIR) 10 MG tablet Take 10 mg by mouth at bedtime as needed (allergy symptoms).     . ondansetron (ZOFRAN) 4 MG tablet Take 1 tablet (4 mg total) by mouth every 6 (six) hours as needed for nausea. 20 tablet 0  . polyethylene glycol (MIRALAX / GLYCOLAX) packet Take 17 g by mouth daily as needed for mild constipation or moderate constipation.     . valsartan (DIOVAN) 160 MG tablet Take 160 mg by mouth daily.      No current facility-administered medications for this visit.   Facility-Administered Medications Ordered in Other Visits  Medication Dose Route Frequency Provider Last Rate Last Admin  . sodium chloride flush (NS) 0.9 % injection 10 mL  10 mL Intravenous PRN Lloyd Huger, MD   10 mL  at 03/10/18 1200    OBJECTIVE: Vitals:   06/20/20 1003  BP: (!) 154/86  Pulse: 88  Resp: 20  Temp: (!) 97.3 F (36.3 C)  SpO2: 98%     Body mass index is 17.78 kg/m.    ECOG FS:0 - Asymptomatic  General: Thin, no acute distress. Eyes: Pink conjunctiva, anicteric sclera. HEENT: Normocephalic, moist mucous membranes. Lungs: No audible wheezing or coughing. Heart: Regular rate and rhythm. Abdomen: Soft, nontender, no obvious distention. Musculoskeletal: No edema, cyanosis, or clubbing. Neuro: Alert, answering all questions appropriately. Cranial nerves grossly intact. Skin: No rashes or petechiae noted. Psych: Normal affect.  LAB RESULTS:  Lab Results  Component Value Date   NA 134 (L) 06/20/2020   K 4.5 06/20/2020   CL 101 06/20/2020   CO2 26 06/20/2020   GLUCOSE 95 06/20/2020   BUN 33 (H) 06/20/2020   CREATININE 0.85 06/20/2020  CALCIUM 8.6 (L) 06/20/2020   PROT 6.5 06/20/2020   ALBUMIN 3.5 06/20/2020   AST 34 06/20/2020   ALT 19 06/20/2020   ALKPHOS 71 06/20/2020   BILITOT 0.4 06/20/2020   GFRNONAA >60 06/20/2020   GFRAA >60 05/16/2020    Lab Results  Component Value Date   WBC 3.2 (L) 06/20/2020   NEUTROABS 2.1 06/20/2020   HGB 9.0 (L) 06/20/2020   HCT 27.4 (L) 06/20/2020   MCV 91.6 06/20/2020   PLT 183 06/20/2020     STUDIES: No results found.  ASSESSMENT: Stage IIIC ovarian cancer.  PLAN:    1. Stage IIIC ovarian cancer: Patient is now considered platinum refractory and is receiving single agent gemcitabine. Plan to give treatment on days 1, 8, and 15 with a 22 off. CT scan results from May 06, 2020 reviewed independently with progression of disease.  Patient's CA-125 initially trended back up to 536, but continues to trend down and is now 230.  Today's result is pending.  We previously discussed discontinuing treatments, but patient wishes to continue to pursue single agent gemcitabine at this time.  Proceed with cycle 8, day 15 of  treatment today.  Return to clinic in 2 weeks for further evaluation and consideration of cycle 9, day 1. 2.  Liver abscess: Improved per CT scan.  Appreciate infectious disease input.  Patient has now completed IV antibiotics.  Proceed with treatment as above.   3. Lupus anticoagulant: Patient was noted to have an elevated PTT as well as increased bleeding during her surgery for rectal prolapse.   4. Anemia: Multifactorial including heme positive stools secondary to GI bleed as well as chemotherapy.  Given patient's age and comorbidities, will not refer for EGD or colonoscopy at this time, but if there is a significant bleed or drop in hemoglobin may reconsider.  Hemoglobin is essentially unchanged at 9.0. 5.  Constipation: Continue OTC remedies as needed. 6.  Hyponatremia: Mild, monitor. 7.  Back/abdominal pain: Patient states tramadol is "too strong".  Continue Tylenol as needed.  Imaging as above. 8.  Blood in stool: Patient has a history of hemorrhoids as well as rectal prolapse.  Hemoccult cards are positive, but repeat testing was negative.  Continue to monitor closely. 9.  Weakness and fatigue: Chronic and unchanged. 10. Memory: Chronic and unchanged. 11. Reflux: Continue Nexium as needed. 12.  Leukopenia: Chronic and unchanged.  Proceed with treatment as above.  Patient expressed understanding and was in agreement with this plan. She also understands that She can call clinic at any time with any questions, concerns, or complaints.   Cancer Staging Malignant neoplasm of ovary [facility]) Staging form: Ovary, Fallopian Tube, and Primary Peritoneal Carcinoma, AJCC 8th Edition - Clinical stage from 05/29/2017: Stage IIIC (cT3c, cN1b, cM0) - Signed by Lloyd Huger, MD on 05/29/2017   Lloyd Huger, MD   06/21/2020 6:25 AM

## 2020-06-19 ENCOUNTER — Encounter: Payer: Self-pay | Admitting: Oncology

## 2020-06-20 ENCOUNTER — Inpatient Hospital Stay: Payer: Medicare PPO | Attending: Oncology

## 2020-06-20 ENCOUNTER — Other Ambulatory Visit: Payer: Self-pay

## 2020-06-20 ENCOUNTER — Inpatient Hospital Stay (HOSPITAL_BASED_OUTPATIENT_CLINIC_OR_DEPARTMENT_OTHER): Payer: Medicare PPO | Admitting: Oncology

## 2020-06-20 ENCOUNTER — Inpatient Hospital Stay: Payer: Medicare PPO

## 2020-06-20 ENCOUNTER — Encounter: Payer: Self-pay | Admitting: Oncology

## 2020-06-20 VITALS — BP 154/86 | HR 88 | Temp 97.3°F | Resp 20 | Ht 61.0 in | Wt 94.1 lb

## 2020-06-20 DIAGNOSIS — C569 Malignant neoplasm of unspecified ovary: Secondary | ICD-10-CM | POA: Insufficient documentation

## 2020-06-20 DIAGNOSIS — R5382 Chronic fatigue, unspecified: Secondary | ICD-10-CM | POA: Diagnosis not present

## 2020-06-20 DIAGNOSIS — K5900 Constipation, unspecified: Secondary | ICD-10-CM | POA: Diagnosis not present

## 2020-06-20 DIAGNOSIS — M1990 Unspecified osteoarthritis, unspecified site: Secondary | ICD-10-CM | POA: Insufficient documentation

## 2020-06-20 DIAGNOSIS — K219 Gastro-esophageal reflux disease without esophagitis: Secondary | ICD-10-CM | POA: Insufficient documentation

## 2020-06-20 DIAGNOSIS — D72819 Decreased white blood cell count, unspecified: Secondary | ICD-10-CM | POA: Diagnosis not present

## 2020-06-20 DIAGNOSIS — Z79899 Other long term (current) drug therapy: Secondary | ICD-10-CM | POA: Insufficient documentation

## 2020-06-20 DIAGNOSIS — Z5111 Encounter for antineoplastic chemotherapy: Secondary | ICD-10-CM | POA: Insufficient documentation

## 2020-06-20 DIAGNOSIS — E871 Hypo-osmolality and hyponatremia: Secondary | ICD-10-CM | POA: Diagnosis not present

## 2020-06-20 DIAGNOSIS — R531 Weakness: Secondary | ICD-10-CM | POA: Insufficient documentation

## 2020-06-20 DIAGNOSIS — R109 Unspecified abdominal pain: Secondary | ICD-10-CM | POA: Insufficient documentation

## 2020-06-20 DIAGNOSIS — I10 Essential (primary) hypertension: Secondary | ICD-10-CM | POA: Diagnosis not present

## 2020-06-20 LAB — CBC WITH DIFFERENTIAL/PLATELET
Abs Immature Granulocytes: 0.03 10*3/uL (ref 0.00–0.07)
Basophils Absolute: 0 10*3/uL (ref 0.0–0.1)
Basophils Relative: 1 %
Eosinophils Absolute: 0 10*3/uL (ref 0.0–0.5)
Eosinophils Relative: 0 %
HCT: 27.4 % — ABNORMAL LOW (ref 36.0–46.0)
Hemoglobin: 9 g/dL — ABNORMAL LOW (ref 12.0–15.0)
Immature Granulocytes: 1 %
Lymphocytes Relative: 24 %
Lymphs Abs: 0.8 10*3/uL (ref 0.7–4.0)
MCH: 30.1 pg (ref 26.0–34.0)
MCHC: 32.8 g/dL (ref 30.0–36.0)
MCV: 91.6 fL (ref 80.0–100.0)
Monocytes Absolute: 0.3 10*3/uL (ref 0.1–1.0)
Monocytes Relative: 9 %
Neutro Abs: 2.1 10*3/uL (ref 1.7–7.7)
Neutrophils Relative %: 65 %
Platelets: 183 10*3/uL (ref 150–400)
RBC: 2.99 MIL/uL — ABNORMAL LOW (ref 3.87–5.11)
RDW: 18.1 % — ABNORMAL HIGH (ref 11.5–15.5)
WBC: 3.2 10*3/uL — ABNORMAL LOW (ref 4.0–10.5)
nRBC: 0 % (ref 0.0–0.2)

## 2020-06-20 LAB — COMPREHENSIVE METABOLIC PANEL
ALT: 19 U/L (ref 0–44)
AST: 34 U/L (ref 15–41)
Albumin: 3.5 g/dL (ref 3.5–5.0)
Alkaline Phosphatase: 71 U/L (ref 38–126)
Anion gap: 7 (ref 5–15)
BUN: 33 mg/dL — ABNORMAL HIGH (ref 8–23)
CO2: 26 mmol/L (ref 22–32)
Calcium: 8.6 mg/dL — ABNORMAL LOW (ref 8.9–10.3)
Chloride: 101 mmol/L (ref 98–111)
Creatinine, Ser: 0.85 mg/dL (ref 0.44–1.00)
GFR, Estimated: >60 mL/min (ref >60)
Glucose, Bld: 95 mg/dL (ref 70–99)
Potassium: 4.5 mmol/L (ref 3.5–5.1)
Sodium: 134 mmol/L — ABNORMAL LOW (ref 135–145)
Total Bilirubin: 0.4 mg/dL (ref 0.3–1.2)
Total Protein: 6.5 g/dL (ref 6.5–8.1)

## 2020-06-20 MED ORDER — SODIUM CHLORIDE 0.9 % IV SOLN
Freq: Once | INTRAVENOUS | Status: AC
  Filled 2020-06-20: qty 250

## 2020-06-20 MED ORDER — HEPARIN SOD (PORK) LOCK FLUSH 100 UNIT/ML IV SOLN
INTRAVENOUS | Status: AC
  Filled 2020-06-20: qty 5

## 2020-06-20 MED ORDER — PROCHLORPERAZINE MALEATE 10 MG PO TABS
10.0000 mg | ORAL_TABLET | Freq: Once | ORAL | Status: AC
  Administered 2020-06-20: 10 mg via ORAL
  Filled 2020-06-20: qty 1

## 2020-06-20 MED ORDER — SODIUM CHLORIDE 0.9 % IV SOLN
1000.0000 mg | Freq: Once | INTRAVENOUS | Status: AC
  Administered 2020-06-20: 988.5932 mg via INTRAVENOUS
  Filled 2020-06-20: qty 26

## 2020-06-20 MED ORDER — HEPARIN SOD (PORK) LOCK FLUSH 100 UNIT/ML IV SOLN
500.0000 [IU] | Freq: Once | INTRAVENOUS | Status: AC | PRN
  Administered 2020-06-20: 500 [IU]
  Filled 2020-06-20: qty 5

## 2020-06-20 NOTE — Progress Notes (Signed)
Pt tolerated infusion well. No s/s of distress or reaction noted. Pt stable at discharge.  

## 2020-06-21 LAB — CA 125: Cancer Antigen (CA) 125: 251 U/mL — ABNORMAL HIGH (ref 0.0–38.1)

## 2020-06-29 NOTE — Progress Notes (Signed)
[place]  [phone]) [phone] [phone]) [phone]  ID: Tamara Mckinney OB: 10-Jan-1928  MR#: [id number]  [id number]#:[id number]  Patient Care Team: [name], MD as PCP - General (Family Medicine) [name], RN as Registered Nurse Herbert Pun, MD as Consulting Physician (General Surgery) Lloyd Huger, MD as Consulting Physician (Oncology)  CHIEF COMPLAINT: Stage IIIC ovarian cancer.  INTERVAL HISTORY: Patient returns to clinic today for further evaluation and consideration of cycle 9, day 1 of single agent gemcitabine.  She continues to complain of a poor memory, but otherwise feels well.  She has occasional constipation.  She also has occasional abdominal pain which is well controlled with Tylenol.  She has no neurologic complaints.  She has a fair appetite and her weight has remained stable.  She denies any chest pain, shortness of breath, cough, or hemoptysis.  She denies any nausea, vomiting, or diarrhea.  She does not complain of black tarry stools today.  She has no urinary complaints.  Patient offers no further specific complaints today.  REVIEW OF SYSTEMS:   Review of Systems  Constitutional: Negative.  Negative for fever, malaise/fatigue and weight loss.  Respiratory: Negative.  Negative for cough and shortness of breath.   Cardiovascular: Negative.  Negative for chest pain and leg swelling.  Gastrointestinal: Positive for abdominal pain and constipation. Negative for blood in stool, diarrhea, heartburn, melena, nausea and vomiting.  Genitourinary: Negative.  Negative for dysuria.  Musculoskeletal: Negative.  Negative for back pain and neck pain.  Skin: Negative.  Negative for itching and rash.  Neurological: Negative.  Negative for dizziness, focal weakness and weakness.  Endo/Heme/Allergies: Does not bruise/bleed easily.  Psychiatric/Behavioral: Positive for memory loss. The patient is not nervous/anxious.     As per HPI.  Otherwise, a complete review of systems is negative.  PAST MEDICAL HISTORY: Past Medical History:  Diagnosis Date  . Anemia   . Arthritis   . Asthma   . Dyspnea   . GERD (gastroesophageal reflux disease)   . Hypertension   . Ovarian cancer ([place]) 2018    PAST SURGICAL HISTORY: Past Surgical History:  Procedure Laterality Date  . ABDOMINAL HYSTERECTOMY    . BREAST BIOPSY     x6  . BUNIONECTOMY Bilateral   . CATARACT EXTRACTION, BILATERAL    . FLEXIBLE SIGMOIDOSCOPY N/A 05/21/2017   Procedure: FLEXIBLE SIGMOIDOSCOPY;  Surgeon: Lin Landsman, MD;  Location: [facility] ENDOSCOPY;  Service: Gastroenterology;  Laterality: N/A;  . INCONTINENCE SURGERY    . PORTA CATH INSERTION N/A 06/16/2017   Procedure: PORTA CATH INSERTION;  Surgeon: Algernon Huxley, MD;  Location: [place] CV LAB;  Service: Cardiovascular;  Laterality: N/A;  . RECTAL SURGERY  04/2018  . REPAIR OF RECTAL PROLAPSE N/A 05/11/2017   Procedure: REPAIR OF RECTAL PROLAPSE;  Surgeon: Leonie Green, MD;  Location: [facility] ORS;  Service: General;  Laterality: N/A;  . TEE WITHOUT CARDIOVERSION N/A 03/22/2020   Procedure: TRANSESOPHAGEAL ECHOCARDIOGRAM (TEE);  Surgeon: Nelva Bush, MD;  Location: [facility] ORS;  Service: Cardiovascular;  Laterality: N/A;  . TONSILLECTOMY    . VAGINA SURGERY     uncertain procedure performed    FAMILY HISTORY: Family History  Problem Relation Age of Onset  . Heart disease Mother   . Heart disease Father   . Heart disease Sister   . Heart attack Sister   . Ulcerative colitis Brother   . Lung cancer Brother   . Thyroid cancer Sister   . Asthma Sister   .  Diabetes Sister   . Asthma Sister   . Pancreatic cancer Sister   . Dementia Sister   . Asthma Brother   . Heart disease Brother   . Asthma Brother   . Lung cancer Brother   . Asthma Brother   . Lung cancer Brother   . Lung cancer Brother   . Rheum arthritis Brother     ADVANCED DIRECTIVES (Y/N):  N  HEALTH  MAINTENANCE: Social History   Tobacco Use  . Smoking status: Never Smoker  . Smokeless tobacco: Never Used  Vaping Use  . Vaping Use: Never used  Substance Use Topics  . Alcohol use: No  . Drug use: No     Colonoscopy:  PAP:  Bone density:  Lipid panel:  No Known Allergies  Current Outpatient Medications  Medication Sig Dispense Refill  . acetaminophen (TYLENOL) 500 MG tablet Take 500-1,000 mg by mouth every 6 (six) hours as needed for mild pain, moderate pain or fever.     . feeding supplement, ENSURE ENLIVE, (ENSURE ENLIVE) LIQD Take 237 mLs by mouth 2 (two) times daily between meals. 237 mL 12  . iron polysaccharides (NIFEREX) 150 MG capsule Take 1 capsule (150 mg total) by mouth daily. 90 capsule 1  . lidocaine-prilocaine (EMLA) cream APPLY A SMALL AMOUNT TO PORT SITE AT LEAST 1 HOUR PRIOR TO IT BEING ACCESSED THEN COVER WITH PLASTIC WRAP 30 g 0  . montelukast (SINGULAIR) 10 MG tablet Take 10 mg by mouth at bedtime as needed (allergy symptoms).     . ondansetron (ZOFRAN) 4 MG tablet Take 1 tablet (4 mg total) by mouth every 6 (six) hours as needed for nausea. 20 tablet 0  . polyethylene glycol (MIRALAX / GLYCOLAX) packet Take 17 g by mouth daily as needed for mild constipation or moderate constipation.     . valsartan (DIOVAN) 160 MG tablet Take 160 mg by mouth daily.     . cyclobenzaprine (FLEXERIL) 10 MG tablet TAKE ONE TABLET BY MOUTH THREE TIMES A DAY AS NEEDED (Patient not taking: Reported on 07/04/2020) 30 tablet 0  . magic mouthwash w/lidocaine SOLN Take 5 mLs by mouth 4 (four) times daily as needed for mouth pain. (Patient not taking: Reported on 07/04/2020) 240 mL 1   No current facility-administered medications for this visit.   Facility-Administered Medications Ordered in Other Visits  Medication Dose Route Frequency Provider Last Rate Last Admin  . sodium chloride flush (NS) 0.9 % injection 10 mL  10 mL Intravenous PRN Lloyd Huger, MD   10 mL at 03/10/18  1200    OBJECTIVE: Vitals:   07/04/20 1011  BP: (!) 148/88  Pulse: 97  Temp: 98.2 F (36.8 C)  SpO2: 100%     Body mass index is 18.08 kg/m.    ECOG FS:0 - Asymptomatic  General: Well-developed, well-nourished, no acute distress. Eyes: Pink conjunctiva, anicteric sclera. HEENT: Normocephalic, moist mucous membranes. Lungs: No audible wheezing or coughing. Heart: Regular rate and rhythm. Abdomen: Soft, nontender, no obvious distention. Musculoskeletal: No edema, cyanosis, or clubbing. Neuro: Alert, answering all questions appropriately. Cranial nerves grossly intact. Skin: No rashes or petechiae noted. Psych: Normal affect.  LAB RESULTS:  Lab Results  Component Value Date   NA 132 (L) 07/04/2020   K 4.2 07/04/2020   CL 98 07/04/2020   CO2 28 07/04/2020   GLUCOSE 96 07/04/2020   BUN 28 (H) 07/04/2020   CREATININE 0.68 07/04/2020   CALCIUM 8.4 (L) 07/04/2020   PROT 6.0 (  L) 07/04/2020   ALBUMIN 3.3 (L) 07/04/2020   AST 41 07/04/2020   ALT 13 07/04/2020   ALKPHOS 65 07/04/2020   BILITOT 0.4 07/04/2020   GFRNONAA >60 07/04/2020   GFRAA >60 05/16/2020    Lab Results  Component Value Date   WBC 9.1 07/04/2020   NEUTROABS 6.7 07/04/2020   HGB 8.6 (L) 07/04/2020   HCT 26.9 (L) 07/04/2020   MCV 93.4 07/04/2020   PLT 497 (H) 07/04/2020     STUDIES: No results found.  ASSESSMENT: Stage IIIC ovarian cancer.  PLAN:    1. Stage IIIC ovarian cancer: Patient is now considered platinum refractory and is receiving single agent gemcitabine. Plan to give treatment on days 1, 8, and 15 with a 22 off. CT scan results from May 06, 2020 reviewed independently with progression of disease.  Patient's CA-125 initially trended back up to 536, but continues to trend down and is now 235.0.  We previously discussed discontinuing treatments, but patient wishes to continue to pursue single agent gemcitabine at this time.  Proceed with cycle 9, day 1 of treatment today.  Because of  the Thanksgiving holiday, patient will return to clinic in 2 weeks for further evaluation and consideration of cycle 9, day 15. 2.  Liver abscess: Improved per CT scan.  Appreciate infectious disease input.  Patient has now completed IV antibiotics.  Proceed with treatment as above.   3. Lupus anticoagulant: Patient was noted to have an elevated PTT as well as increased bleeding during her surgery for rectal prolapse.   4. Anemia: Multifactorial including heme positive stools secondary to GI bleed as well as chemotherapy.  Given patient's age and comorbidities, will not refer for EGD or colonoscopy at this time, but if there is a significant bleed or drop in hemoglobin may reconsider.  Hemoglobin trended down slightly to 8.6.  Monitor. 5.  Constipation: Continue OTC remedies as needed. 6.  Hyponatremia: Chronic and unchanged. 7.  Back/abdominal pain: Patient states tramadol is "too strong".  Continue Tylenol as needed.  Imaging as above. 8.  Blood in stool: Patient has a history of hemorrhoids as well as rectal prolapse.  Hemoccult cards are positive, but repeat testing was negative.  Continue to monitor closely. 9.  Weakness and fatigue: Chronic and unchanged. 10. Memory: Chronic and unchanged. 11. Reflux: Continue Nexium as needed. 12.  Leukopenia: Resolved.  Patient expressed understanding and was in agreement with this plan. She also understands that She can call clinic at any time with any questions, concerns, or complaints.   Cancer Staging Malignant neoplasm of ovary [facility]) Staging form: Ovary, Fallopian Tube, and Primary Peritoneal Carcinoma, AJCC 8th Edition - Clinical stage from 05/29/2017: Stage IIIC (cT3c, cN1b, cM0) - Signed by Lloyd Huger, MD on 05/29/2017   Lloyd Huger, MD   07/06/2020 8:35 AM

## 2020-07-04 ENCOUNTER — Inpatient Hospital Stay (HOSPITAL_BASED_OUTPATIENT_CLINIC_OR_DEPARTMENT_OTHER): Payer: Medicare PPO | Admitting: Oncology

## 2020-07-04 ENCOUNTER — Encounter: Payer: Self-pay | Admitting: Oncology

## 2020-07-04 ENCOUNTER — Other Ambulatory Visit: Payer: Self-pay

## 2020-07-04 ENCOUNTER — Inpatient Hospital Stay: Payer: Medicare PPO

## 2020-07-04 VITALS — BP 148/88 | HR 97 | Temp 98.2°F | Wt 95.7 lb

## 2020-07-04 DIAGNOSIS — C569 Malignant neoplasm of unspecified ovary: Secondary | ICD-10-CM

## 2020-07-04 DIAGNOSIS — Z5111 Encounter for antineoplastic chemotherapy: Secondary | ICD-10-CM | POA: Diagnosis not present

## 2020-07-04 LAB — COMPREHENSIVE METABOLIC PANEL
ALT: 13 U/L (ref 0–44)
AST: 41 U/L (ref 15–41)
Albumin: 3.3 g/dL — ABNORMAL LOW (ref 3.5–5.0)
Alkaline Phosphatase: 65 U/L (ref 38–126)
Anion gap: 6 (ref 5–15)
BUN: 28 mg/dL — ABNORMAL HIGH (ref 8–23)
CO2: 28 mmol/L (ref 22–32)
Calcium: 8.4 mg/dL — ABNORMAL LOW (ref 8.9–10.3)
Chloride: 98 mmol/L (ref 98–111)
Creatinine, Ser: 0.68 mg/dL (ref 0.44–1.00)
GFR, Estimated: >60 mL/min (ref >60)
Glucose, Bld: 96 mg/dL (ref 70–99)
Potassium: 4.2 mmol/L (ref 3.5–5.1)
Sodium: 132 mmol/L — ABNORMAL LOW (ref 135–145)
Total Bilirubin: 0.4 mg/dL (ref 0.3–1.2)
Total Protein: 6 g/dL — ABNORMAL LOW (ref 6.5–8.1)

## 2020-07-04 LAB — CBC WITH DIFFERENTIAL/PLATELET
Abs Immature Granulocytes: 0.05 10*3/uL (ref 0.00–0.07)
Basophils Absolute: 0 10*3/uL (ref 0.0–0.1)
Basophils Relative: 0 %
Eosinophils Absolute: 0 10*3/uL (ref 0.0–0.5)
Eosinophils Relative: 0 %
HCT: 26.9 % — ABNORMAL LOW (ref 36.0–46.0)
Hemoglobin: 8.6 g/dL — ABNORMAL LOW (ref 12.0–15.0)
Immature Granulocytes: 1 %
Lymphocytes Relative: 8 %
Lymphs Abs: 0.8 10*3/uL (ref 0.7–4.0)
MCH: 29.9 pg (ref 26.0–34.0)
MCHC: 32 g/dL (ref 30.0–36.0)
MCV: 93.4 fL (ref 80.0–100.0)
Monocytes Absolute: 1.5 10*3/uL — ABNORMAL HIGH (ref 0.1–1.0)
Monocytes Relative: 17 %
Neutro Abs: 6.7 10*3/uL (ref 1.7–7.7)
Neutrophils Relative %: 74 %
Platelets: 497 10*3/uL — ABNORMAL HIGH (ref 150–400)
RBC: 2.88 MIL/uL — ABNORMAL LOW (ref 3.87–5.11)
RDW: 18.9 % — ABNORMAL HIGH (ref 11.5–15.5)
WBC: 9.1 10*3/uL (ref 4.0–10.5)
nRBC: 0 % (ref 0.0–0.2)

## 2020-07-04 MED ORDER — PROCHLORPERAZINE MALEATE 10 MG PO TABS
10.0000 mg | ORAL_TABLET | Freq: Once | ORAL | Status: AC
  Administered 2020-07-04: 10 mg via ORAL
  Filled 2020-07-04: qty 1

## 2020-07-04 MED ORDER — HEPARIN SOD (PORK) LOCK FLUSH 100 UNIT/ML IV SOLN
500.0000 [IU] | Freq: Once | INTRAVENOUS | Status: AC
  Administered 2020-07-04: 500 [IU] via INTRAVENOUS
  Filled 2020-07-04: qty 5

## 2020-07-04 MED ORDER — SODIUM CHLORIDE 0.9 % IV SOLN
1000.0000 mg | Freq: Once | INTRAVENOUS | Status: AC
  Administered 2020-07-04: 988.5932 mg via INTRAVENOUS
  Filled 2020-07-04: qty 26

## 2020-07-04 MED ORDER — HEPARIN SOD (PORK) LOCK FLUSH 100 UNIT/ML IV SOLN
INTRAVENOUS | Status: AC
  Filled 2020-07-04: qty 5

## 2020-07-04 MED ORDER — SODIUM CHLORIDE 0.9 % IV SOLN
Freq: Once | INTRAVENOUS | Status: AC
  Filled 2020-07-04: qty 250

## 2020-07-04 MED ORDER — SODIUM CHLORIDE 0.9% FLUSH
10.0000 mL | Freq: Once | INTRAVENOUS | Status: AC
  Administered 2020-07-04: 10 mL via INTRAVENOUS
  Filled 2020-07-04: qty 10

## 2020-07-04 NOTE — Progress Notes (Signed)
Patient denies new problems/concerns today.   °

## 2020-07-05 LAB — CA 125: Cancer Antigen (CA) 125: 235 U/mL — ABNORMAL HIGH (ref 0.0–38.1)

## 2020-07-10 NOTE — Progress Notes (Signed)
[place]  Telephone:(336) [phone] Fax:(336) [phone]  ID: Tamara Mckinney OB: [birth year]  MR#: [id number]  [id number]#:[id number]  Patient Care Team: [name], MD as PCP - General (Family Medicine) [name], RN as Registered Nurse Herbert Pun, MD as Consulting Physician (General Surgery) Lloyd Huger, MD as Consulting Physician (Oncology)  CHIEF COMPLAINT: Stage IIIC ovarian cancer.  INTERVAL HISTORY: Patient returns to clinic today for further evaluation and consideration of cycle 9, day 15 of single agent gemcitabine.  Cycle 9, day 8 was skipped secondary to the Thanksgiving holiday.  She has noticed increasing weakness and fatigue, but otherwise feels well.  She has occasional constipation.  She also has occasional abdominal pain which is well controlled with Tylenol.  She has no neurologic complaints.  She has a fair appetite and her weight has remained stable.  She denies any chest pain, shortness of breath, cough, or hemoptysis.  She denies any nausea, vomiting, or diarrhea.  She does not complain of black tarry stools today.  She has no urinary complaints.  Patient offers no further specific complaints today.  REVIEW OF SYSTEMS:   Review of Systems  Constitutional: Positive for malaise/fatigue. Negative for fever and weight loss.  Respiratory: Negative.  Negative for cough and shortness of breath.   Cardiovascular: Negative.  Negative for chest pain and leg swelling.  Gastrointestinal: Positive for abdominal pain and constipation. Negative for blood in stool, diarrhea, heartburn, melena, nausea and vomiting.  Genitourinary: Negative.  Negative for dysuria.  Musculoskeletal: Negative.  Negative for back pain and neck pain.  Skin: Negative.  Negative for itching and rash.  Neurological: Positive for weakness. Negative for dizziness and focal weakness.  Endo/Heme/Allergies: Does not bruise/bleed easily.  Psychiatric/Behavioral: Positive  for memory loss. The patient is not nervous/anxious.     As per HPI. Otherwise, a complete review of systems is negative.  PAST MEDICAL HISTORY: Past Medical History:  Diagnosis Date   Anemia    Arthritis    Asthma    Dyspnea    GERD (gastroesophageal reflux disease)    Hypertension    Ovarian cancer ([place]) 2018    PAST SURGICAL HISTORY: Past Surgical History:  Procedure Laterality Date   ABDOMINAL HYSTERECTOMY     BREAST BIOPSY     x6   BUNIONECTOMY Bilateral    CATARACT EXTRACTION, BILATERAL     FLEXIBLE SIGMOIDOSCOPY N/A 05/21/2017   Procedure: FLEXIBLE SIGMOIDOSCOPY;  Surgeon: Lin Landsman, MD;  Location: [place];  Service: Gastroenterology;  Laterality: N/A;   INCONTINENCE SURGERY     PORTA CATH INSERTION N/A 06/16/2017   Procedure: PORTA CATH INSERTION;  Surgeon: Algernon Huxley, MD;  Location: [place] CV LAB;  Service: Cardiovascular;  Laterality: N/A;   RECTAL SURGERY  04/2018   REPAIR OF RECTAL PROLAPSE N/A 05/11/2017   Procedure: REPAIR OF RECTAL PROLAPSE;  Surgeon: Leonie Green, MD;  Location: [facility] ORS;  Service: General;  Laterality: N/A;   TEE WITHOUT CARDIOVERSION N/A 03/22/2020   Procedure: TRANSESOPHAGEAL ECHOCARDIOGRAM (TEE);  Surgeon: Nelva Bush, MD;  Location: [facility] ORS;  Service: Cardiovascular;  Laterality: N/A;   TONSILLECTOMY     VAGINA SURGERY     uncertain procedure performed    FAMILY HISTORY: Family History  Problem Relation Age of Onset   Heart disease Mother    Heart disease Father    Heart disease Sister    Heart attack Sister    Ulcerative colitis Brother    Lung cancer Brother  Thyroid cancer Sister    Asthma Sister    Diabetes Sister    Asthma Sister    Pancreatic cancer Sister    Dementia Sister    Asthma Brother    Heart disease Brother    Asthma Brother    Lung cancer Brother    Asthma Brother    Lung cancer Brother    Lung cancer Brother    Rheum  arthritis Brother     ADVANCED DIRECTIVES (Y/N):  N  HEALTH MAINTENANCE: Social History   Tobacco Use   Smoking status: Never Smoker   Smokeless tobacco: Never Used  Scientific laboratory technician Use: Never used  Substance Use Topics   Alcohol use: No   Drug use: No     Colonoscopy:  PAP:  Bone density:  Lipid panel:  No Known Allergies  Current Outpatient Medications  Medication Sig Dispense Refill   acetaminophen (TYLENOL) 500 MG tablet Take 500-1,000 mg by mouth every 6 (six) hours as needed for mild pain, moderate pain or fever.      feeding supplement, ENSURE ENLIVE, (ENSURE ENLIVE) LIQD Take 237 mLs by mouth 2 (two) times daily between meals. 237 mL 12   iron polysaccharides (NIFEREX) 150 MG capsule Take 1 capsule (150 mg total) by mouth daily. 90 capsule 1   lidocaine-prilocaine (EMLA) cream APPLY A SMALL AMOUNT TO PORT SITE AT LEAST 1 HOUR PRIOR TO IT BEING ACCESSED THEN COVER WITH PLASTIC WRAP 30 g 0   montelukast (SINGULAIR) 10 MG tablet Take 10 mg by mouth at bedtime as needed (allergy symptoms).      ondansetron (ZOFRAN) 4 MG tablet Take 1 tablet (4 mg total) by mouth every 6 (six) hours as needed for nausea. 20 tablet 0   polyethylene glycol (MIRALAX / GLYCOLAX) packet Take 17 g by mouth daily as needed for mild constipation or moderate constipation.      valsartan (DIOVAN) 160 MG tablet Take 160 mg by mouth daily.      cyclobenzaprine (FLEXERIL) 10 MG tablet TAKE ONE TABLET BY MOUTH THREE TIMES A DAY AS NEEDED (Patient not taking: Reported on 07/04/2020) 30 tablet 0   magic mouthwash w/lidocaine SOLN Take 5 mLs by mouth 4 (four) times daily as needed for mouth pain. (Patient not taking: Reported on 07/04/2020) 240 mL 1   No current facility-administered medications for this visit.   Facility-Administered Medications Ordered in Other Visits  Medication Dose Route Frequency Provider Last Rate Last Admin   sodium chloride flush (NS) 0.9 % injection 10 mL  10  mL Intravenous PRN Lloyd Huger, MD   10 mL at 03/10/18 1200    OBJECTIVE: Vitals:   07/18/20 1137  BP: (!) 152/68  Pulse: 98  Temp: 98.6 F (37 C)  SpO2: 100%     Body mass index is 17.67 kg/m.    ECOG FS:0 - Asymptomatic  General: Thin, no acute distress. Eyes: Pink conjunctiva, anicteric sclera. HEENT: Normocephalic, moist mucous membranes. Lungs: No audible wheezing or coughing. Heart: Regular rate and rhythm. Abdomen: Soft, nontender, no obvious distention. Musculoskeletal: No edema, cyanosis, or clubbing. Neuro: Alert, answering all questions appropriately. Cranial nerves grossly intact. Skin: No rashes or petechiae noted. Psych: Normal affect.   LAB RESULTS:  Lab Results  Component Value Date   NA 132 (L) 07/18/2020   K 4.2 07/18/2020   CL 96 (L) 07/18/2020   CO2 29 07/18/2020   GLUCOSE 91 07/18/2020   BUN 27 (H) 07/18/2020   CREATININE  0.74 07/18/2020   CALCIUM 8.4 (L) 07/18/2020   PROT 6.5 07/18/2020   ALBUMIN 3.4 (L) 07/18/2020   AST 41 07/18/2020   ALT 16 07/18/2020   ALKPHOS 65 07/18/2020   BILITOT 0.6 07/18/2020   GFRNONAA >60 07/18/2020   GFRAA >60 05/16/2020    Lab Results  Component Value Date   WBC 12.1 (H) 07/18/2020   NEUTROABS 9.5 (H) 07/18/2020   HGB 9.3 (L) 07/18/2020   HCT 29.2 (L) 07/18/2020   MCV 94.5 07/18/2020   PLT 246 07/18/2020     STUDIES: No results found.  ASSESSMENT: Stage IIIC ovarian cancer.  PLAN:    1. Stage IIIC ovarian cancer: Patient is now considered platinum refractory and is receiving single agent gemcitabine. Plan to give treatment on days 1, 8, and 15 with a 22 off. CT scan results from May 06, 2020 reviewed independently with progression of disease.  Patient's CA-125 initially trended back up to 536, but continues to trend down and is now 235.0.  Today's result is pending.  We previously discussed discontinuing treatments, but patient wishes to continue to pursue single agent gemcitabine at  this time.  Proceed with cycle 9, day 15 of treatment today.  Cycle 9, day 8 was skipped secondary to the Thanksgiving holiday.  Patient has requested a break over the Christmas holiday, therefore she will return to clinic on August 29, 2020 for further evaluation and consideration of cycle 10, day 1.   2.  Liver abscess: Improved per CT scan.  Appreciate infectious disease input.  Patient has now completed IV antibiotics.  Proceed with treatment as above.   3. Lupus anticoagulant: Patient was noted to have an elevated PTT as well as increased bleeding during her surgery for rectal prolapse.   4. Anemia: Multifactorial including heme positive stools secondary to GI bleed as well as chemotherapy.  Given patient's age and comorbidities, will not refer for EGD or colonoscopy at this time, but if there is a significant bleed or drop in hemoglobin may reconsider.  Hemoglobin is trended up slightly to 9.3.  Monitor. 5.  Constipation: Continue OTC remedies as needed. 6.  Hyponatremia: Chronic and unchanged. 7.  Back/abdominal pain: Patient states tramadol is "too strong".  Continue Tylenol as needed.  Imaging as above. 8.  Blood in stool: Patient has a history of hemorrhoids as well as rectal prolapse.  Hemoccult cards are positive, but repeat testing was negative.  Continue to monitor closely. 9.  Weakness and fatigue: Chronic and unchanged.  Delay treatment 6 weeks as above. 10. Memory: Chronic and unchanged. 11. Reflux: Continue Nexium as needed. 12.  Leukopenia: Patient now has leukocytosis.  Monitor.  Patient expressed understanding and was in agreement with this plan. She also understands that She can call clinic at any time with any questions, concerns, or complaints.   Cancer Staging Malignant neoplasm of ovary [facility]) Staging form: Ovary, Fallopian Tube, and Primary Peritoneal Carcinoma, AJCC 8th Edition - Clinical stage from 05/29/2017: Stage IIIC (cT3c, cN1b, cM0) - Signed by Lloyd Huger, MD on 05/29/2017   Lloyd Huger, MD   07/19/2020 9:58 AM

## 2020-07-18 ENCOUNTER — Inpatient Hospital Stay (HOSPITAL_BASED_OUTPATIENT_CLINIC_OR_DEPARTMENT_OTHER): Payer: Medicare PPO | Admitting: Oncology

## 2020-07-18 ENCOUNTER — Inpatient Hospital Stay: Payer: Medicare PPO

## 2020-07-18 ENCOUNTER — Encounter: Payer: Self-pay | Admitting: Oncology

## 2020-07-18 ENCOUNTER — Inpatient Hospital Stay: Payer: Medicare PPO | Attending: Oncology

## 2020-07-18 VITALS — BP 152/68 | HR 98 | Temp 98.6°F | Wt 93.5 lb

## 2020-07-18 DIAGNOSIS — K5900 Constipation, unspecified: Secondary | ICD-10-CM | POA: Insufficient documentation

## 2020-07-18 DIAGNOSIS — Z801 Family history of malignant neoplasm of trachea, bronchus and lung: Secondary | ICD-10-CM | POA: Diagnosis not present

## 2020-07-18 DIAGNOSIS — C569 Malignant neoplasm of unspecified ovary: Secondary | ICD-10-CM

## 2020-07-18 DIAGNOSIS — K219 Gastro-esophageal reflux disease without esophagitis: Secondary | ICD-10-CM | POA: Diagnosis not present

## 2020-07-18 DIAGNOSIS — R5381 Other malaise: Secondary | ICD-10-CM | POA: Insufficient documentation

## 2020-07-18 DIAGNOSIS — R5383 Other fatigue: Secondary | ICD-10-CM | POA: Insufficient documentation

## 2020-07-18 DIAGNOSIS — E871 Hypo-osmolality and hyponatremia: Secondary | ICD-10-CM | POA: Insufficient documentation

## 2020-07-18 DIAGNOSIS — D72829 Elevated white blood cell count, unspecified: Secondary | ICD-10-CM | POA: Diagnosis not present

## 2020-07-18 DIAGNOSIS — Z90722 Acquired absence of ovaries, bilateral: Secondary | ICD-10-CM | POA: Insufficient documentation

## 2020-07-18 DIAGNOSIS — M1990 Unspecified osteoarthritis, unspecified site: Secondary | ICD-10-CM | POA: Insufficient documentation

## 2020-07-18 DIAGNOSIS — I10 Essential (primary) hypertension: Secondary | ICD-10-CM | POA: Insufficient documentation

## 2020-07-18 DIAGNOSIS — Z90710 Acquired absence of both cervix and uterus: Secondary | ICD-10-CM | POA: Insufficient documentation

## 2020-07-18 DIAGNOSIS — D500 Iron deficiency anemia secondary to blood loss (chronic): Secondary | ICD-10-CM | POA: Insufficient documentation

## 2020-07-18 DIAGNOSIS — R109 Unspecified abdominal pain: Secondary | ICD-10-CM | POA: Diagnosis not present

## 2020-07-18 DIAGNOSIS — Z79899 Other long term (current) drug therapy: Secondary | ICD-10-CM | POA: Diagnosis not present

## 2020-07-18 DIAGNOSIS — K921 Melena: Secondary | ICD-10-CM | POA: Diagnosis not present

## 2020-07-18 DIAGNOSIS — Z5111 Encounter for antineoplastic chemotherapy: Secondary | ICD-10-CM | POA: Diagnosis present

## 2020-07-18 LAB — CBC WITH DIFFERENTIAL/PLATELET
Abs Immature Granulocytes: 0.06 10*3/uL (ref 0.00–0.07)
Basophils Absolute: 0.1 10*3/uL (ref 0.0–0.1)
Basophils Relative: 0 %
Eosinophils Absolute: 0 10*3/uL (ref 0.0–0.5)
Eosinophils Relative: 0 %
HCT: 29.2 % — ABNORMAL LOW (ref 36.0–46.0)
Hemoglobin: 9.3 g/dL — ABNORMAL LOW (ref 12.0–15.0)
Immature Granulocytes: 1 %
Lymphocytes Relative: 8 %
Lymphs Abs: 1 10*3/uL (ref 0.7–4.0)
MCH: 30.1 pg (ref 26.0–34.0)
MCHC: 31.8 g/dL (ref 30.0–36.0)
MCV: 94.5 fL (ref 80.0–100.0)
Monocytes Absolute: 1.6 10*3/uL — ABNORMAL HIGH (ref 0.1–1.0)
Monocytes Relative: 13 %
Neutro Abs: 9.5 10*3/uL — ABNORMAL HIGH (ref 1.7–7.7)
Neutrophils Relative %: 78 %
Platelets: 246 10*3/uL (ref 150–400)
RBC: 3.09 MIL/uL — ABNORMAL LOW (ref 3.87–5.11)
RDW: 18 % — ABNORMAL HIGH (ref 11.5–15.5)
WBC: 12.1 10*3/uL — ABNORMAL HIGH (ref 4.0–10.5)
nRBC: 0 % (ref 0.0–0.2)

## 2020-07-18 LAB — COMPREHENSIVE METABOLIC PANEL
ALT: 16 U/L (ref 0–44)
AST: 41 U/L (ref 15–41)
Albumin: 3.4 g/dL — ABNORMAL LOW (ref 3.5–5.0)
Alkaline Phosphatase: 65 U/L (ref 38–126)
Anion gap: 7 (ref 5–15)
BUN: 27 mg/dL — ABNORMAL HIGH (ref 8–23)
CO2: 29 mmol/L (ref 22–32)
Calcium: 8.4 mg/dL — ABNORMAL LOW (ref 8.9–10.3)
Chloride: 96 mmol/L — ABNORMAL LOW (ref 98–111)
Creatinine, Ser: 0.74 mg/dL (ref 0.44–1.00)
GFR, Estimated: >60 mL/min (ref >60)
Glucose, Bld: 91 mg/dL (ref 70–99)
Potassium: 4.2 mmol/L (ref 3.5–5.1)
Sodium: 132 mmol/L — ABNORMAL LOW (ref 135–145)
Total Bilirubin: 0.6 mg/dL (ref 0.3–1.2)
Total Protein: 6.5 g/dL (ref 6.5–8.1)

## 2020-07-18 MED ORDER — HEPARIN SOD (PORK) LOCK FLUSH 100 UNIT/ML IV SOLN
500.0000 [IU] | Freq: Once | INTRAVENOUS | Status: AC
  Administered 2020-07-18: 500 [IU] via INTRAVENOUS
  Filled 2020-07-18: qty 5

## 2020-07-18 MED ORDER — SODIUM CHLORIDE 0.9 % IV SOLN
1000.0000 mg | Freq: Once | INTRAVENOUS | Status: AC
  Administered 2020-07-18: 988.5932 mg via INTRAVENOUS
  Filled 2020-07-18: qty 26

## 2020-07-18 MED ORDER — PROCHLORPERAZINE MALEATE 10 MG PO TABS
10.0000 mg | ORAL_TABLET | Freq: Once | ORAL | Status: DC
  Administered 2020-07-18: 10 mg via ORAL

## 2020-07-18 MED ORDER — SODIUM CHLORIDE 0.9 % IV SOLN
1000.0000 mg | Freq: Once | INTRAVENOUS | Status: DC
  Filled 2020-07-18: qty 26

## 2020-07-18 MED ORDER — PROCHLORPERAZINE MALEATE 10 MG PO TABS
10.0000 mg | ORAL_TABLET | Freq: Once | ORAL | Status: AC
  Administered 2020-07-18: 10 mg via ORAL
  Filled 2020-07-18: qty 1

## 2020-07-18 MED ORDER — SODIUM CHLORIDE 0.9% FLUSH
10.0000 mL | Freq: Once | INTRAVENOUS | Status: AC
  Administered 2020-07-18: 10 mL via INTRAVENOUS
  Filled 2020-07-18: qty 10

## 2020-07-18 MED ORDER — SODIUM CHLORIDE 0.9 % IV SOLN
Freq: Once | INTRAVENOUS | Status: AC
  Filled 2020-07-18: qty 250

## 2020-07-18 NOTE — Progress Notes (Signed)
Patient tolerated infusion well, no concerns voiced. Patient discharged. Stable. 

## 2020-07-18 NOTE — Progress Notes (Signed)
Patient states she has been dizzy on and off for the past two weeks. Patient states she also been having trouble with her memory.

## 2020-08-01 ENCOUNTER — Ambulatory Visit: Payer: Medicare PPO

## 2020-08-01 ENCOUNTER — Ambulatory Visit: Payer: Medicare PPO | Admitting: Oncology

## 2020-08-01 ENCOUNTER — Other Ambulatory Visit: Payer: Medicare PPO

## 2020-08-24 NOTE — Progress Notes (Signed)
[place]  [phone] Fax:(336) [phone]  ID: Tamara Mckinney OB: 1927/10/28  MR#: [id number]  FMB#:[id number]  Patient Care Team: [name], MD as PCP - General (Family Medicine) [name], RN as Registered Nurse Herbert Pun, MD as Consulting Physician (General Surgery) Lloyd Huger, MD as Consulting Physician (Oncology)  CHIEF COMPLAINT: Stage IIIC ovarian cancer.  INTERVAL HISTORY: Patient returns to clinic today for further evaluation and reinitiation of single agent gemcitabine with cycle 10, day one.  Patient elected to take time off over the Christmas holiday.  Her weakness and fatigue have improved and she feels near back to her baseline.  She has occasional constipation.  She also has occasional abdominal pain which is well controlled with Tylenol.  She has no neurologic complaints.  She has a fair appetite and her weight has remained stable.  She denies any chest pain, shortness of breath, cough, or hemoptysis.  She denies any nausea, vomiting, or diarrhea.  She does not complain of black tarry stools today.  She has no urinary complaints.  Patient offers no further specific complaints today.  REVIEW OF SYSTEMS:   Review of Systems  Constitutional: Negative for fever, malaise/fatigue and weight loss.  Respiratory: Negative.  Negative for cough and shortness of breath.   Cardiovascular: Negative.  Negative for chest pain and leg swelling.  Gastrointestinal: Negative.  Negative for abdominal pain, blood in stool, constipation, diarrhea, heartburn, melena, nausea and vomiting.  Genitourinary: Negative.  Negative for dysuria.  Musculoskeletal: Negative.  Negative for back pain and neck pain.  Skin: Negative.  Negative for itching and rash.  Neurological: Negative for dizziness, focal weakness and weakness.  Endo/Heme/Allergies: Does not bruise/bleed easily.  Psychiatric/Behavioral: Positive for memory loss. The patient  is not nervous/anxious.     As per HPI. Otherwise, a complete review of systems is negative.  PAST MEDICAL HISTORY: Past Medical History:  Diagnosis Date  . Anemia   . Arthritis   . Asthma   . Dyspnea   . GERD (gastroesophageal reflux disease)   . Hypertension   . Ovarian cancer ([place]) 2018    PAST SURGICAL HISTORY: Past Surgical History:  Procedure Laterality Date  . ABDOMINAL HYSTERECTOMY    . BREAST BIOPSY     x6  . BUNIONECTOMY Bilateral   . CATARACT EXTRACTION, BILATERAL    . FLEXIBLE SIGMOIDOSCOPY N/A 05/21/2017   Procedure: FLEXIBLE SIGMOIDOSCOPY;  Surgeon: Lin Landsman, MD;  Location: [facility] ENDOSCOPY;  Service: Gastroenterology;  Laterality: N/A;  . INCONTINENCE SURGERY    . PORTA CATH INSERTION N/A 06/16/2017   Procedure: PORTA CATH INSERTION;  Surgeon: Algernon Huxley, MD;  Location: [facility];  Service: Cardiovascular;  Laterality: N/A;  . RECTAL SURGERY  04/2018  . REPAIR OF RECTAL PROLAPSE N/A 05/11/2017   Procedure: REPAIR OF RECTAL PROLAPSE;  Surgeon: Leonie Green, MD;  Location: [facility] ORS;  Service: General;  Laterality: N/A;  . TEE WITHOUT CARDIOVERSION N/A 03/22/2020   Procedure: TRANSESOPHAGEAL ECHOCARDIOGRAM (TEE);  Surgeon: Nelva Bush, MD;  Location: [facility] ORS;  Service: Cardiovascular;  Laterality: N/A;  . TONSILLECTOMY    . VAGINA SURGERY     uncertain procedure performed    FAMILY HISTORY: Family History  Problem Relation Age of Onset  . Heart disease Mother   . Heart disease Father   . Heart disease Sister   . Heart attack Sister   . Ulcerative colitis Brother   . Lung cancer Brother   . Thyroid  cancer Sister   . Asthma Sister   . Diabetes Sister   . Asthma Sister   . Pancreatic cancer Sister   . Dementia Sister   . Asthma Brother   . Heart disease Brother   . Asthma Brother   . Lung cancer Brother   . Asthma Brother   . Lung cancer Brother   . Lung cancer Brother   . Rheum arthritis Brother     ADVANCED  DIRECTIVES (Y/N):  N  HEALTH MAINTENANCE: Social History   Tobacco Use  . Smoking status: Never Smoker  . Smokeless tobacco: Never Used  Vaping Use  . Vaping Use: Never used  Substance Use Topics  . Alcohol use: No  . Drug use: No     Colonoscopy:  PAP:  Bone density:  Lipid panel:  No Known Allergies  Current Outpatient Medications  Medication Sig Dispense Refill  . acetaminophen (TYLENOL) 500 MG tablet Take 500-1,000 mg by mouth every 6 (six) hours as needed for mild pain, moderate pain or fever.     . cyclobenzaprine (FLEXERIL) 10 MG tablet TAKE ONE TABLET BY MOUTH THREE TIMES A DAY AS NEEDED (Patient not taking: Reported on 07/04/2020) 30 tablet 0  . feeding supplement, ENSURE ENLIVE, (ENSURE ENLIVE) LIQD Take 237 mLs by mouth 2 (two) times daily between meals. 237 mL 12  . iron polysaccharides (NIFEREX) 150 MG capsule Take 1 capsule (150 mg total) by mouth daily. 90 capsule 1  . lidocaine-prilocaine (EMLA) cream APPLY A SMALL AMOUNT TO PORT SITE AT LEAST 1 HOUR PRIOR TO IT BEING ACCESSED THEN COVER WITH PLASTIC WRAP 30 g 0  . magic mouthwash w/lidocaine SOLN Take 5 mLs by mouth 4 (four) times daily as needed for mouth pain. (Patient not taking: Reported on 07/04/2020) 240 mL 1  . montelukast (SINGULAIR) 10 MG tablet Take 10 mg by mouth at bedtime as needed (allergy symptoms).     . ondansetron (ZOFRAN) 4 MG tablet Take 1 tablet (4 mg total) by mouth every 6 (six) hours as needed for nausea. 20 tablet 0  . polyethylene glycol (MIRALAX / GLYCOLAX) packet Take 17 g by mouth daily as needed for mild constipation or moderate constipation.     . valsartan (DIOVAN) 160 MG tablet Take 160 mg by mouth daily.      No current facility-administered medications for this visit.   Facility-Administered Medications Ordered in Other Visits  Medication Dose Route Frequency Provider Last Rate Last Admin  . sodium chloride flush (NS) 0.9 % injection 10 mL  10 mL Intravenous PRN Lloyd Huger, MD   10 mL at 03/10/18 1200    OBJECTIVE: Vitals:   08/29/20 1103  BP: (!) 161/83  Pulse: 90  Temp: 98 F (36.7 C)     Body mass index is 17.52 kg/m.    ECOG FS:0 - Asymptomatic  General: Well-developed, well-nourished, no acute distress. Eyes: Pink conjunctiva, anicteric sclera. HEENT: Normocephalic, moist mucous membranes. Lungs: No audible wheezing or coughing. Heart: Regular rate and rhythm. Abdomen: Soft, nontender, no obvious distention. Musculoskeletal: No edema, cyanosis, or clubbing. Neuro: Alert, answering all questions appropriately. Cranial nerves grossly intact. Skin: No rashes or petechiae noted. Psych: Normal affect.  LAB RESULTS:  Lab Results  Component Value Date   NA 135 08/29/2020   K 4.4 08/29/2020   CL 100 08/29/2020   CO2 29 08/29/2020   GLUCOSE 93 08/29/2020   BUN 34 (H) 08/29/2020   CREATININE 0.78 08/29/2020   CALCIUM 8.6 (  L) 08/29/2020   PROT 6.1 (L) 08/29/2020   ALBUMIN 3.2 (L) 08/29/2020   AST 41 08/29/2020   ALT 15 08/29/2020   ALKPHOS 71 08/29/2020   BILITOT 0.4 08/29/2020   GFRNONAA >60 08/29/2020   GFRAA >60 05/16/2020    Lab Results  Component Value Date   WBC 12.3 (H) 08/29/2020   NEUTROABS 10.3 (H) 08/29/2020   HGB 9.5 (L) 08/29/2020   HCT 30.3 (L) 08/29/2020   MCV 92.1 08/29/2020   PLT 385 08/29/2020     STUDIES: No results found.  ASSESSMENT: Stage IIIC ovarian cancer.  PLAN:    1. Stage IIIC ovarian cancer: Patient is now considered platinum refractory and is receiving single agent gemcitabine. Plan to give treatment on days 1, 8, and 15 with a 22 off. CT scan results from May 06, 2020 reviewed independently with progression of disease.  Since taking time off, patient's CA-125 has trended up to 539.0.  We previously discussed discontinuing treatments, but patient wishes to continue to pursue single agent gemcitabine at this time.  Proceed with cycle 10, day 1 of treatment today.  Return to clinic in  1 week for further evaluation and consideration of cycle 10, day 8.     2.  Liver abscess: Improved per CT scan.  Appreciate infectious disease input.  Patient has now completed IV antibiotics.  Proceed with treatment as above.   3. Lupus anticoagulant: Patient was noted to have an elevated PTT as well as increased bleeding during her surgery for rectal prolapse.   4. Anemia: Chronic and unchanged.  Multifactorial including heme positive stools secondary to GI bleed as well as chemotherapy.  Given patient's age and comorbidities, will not refer for EGD or colonoscopy at this time, but if there is a significant bleed or drop in hemoglobin may reconsider.  Hemoglobin is at 9.5 today. 5.  Constipation: Continue OTC remedies as needed. 6.  Hyponatremia: Resolved. 7.  Back/abdominal pain: Patient states tramadol is "too strong".  Continue Tylenol as needed.  Imaging as above. 8.  Blood in stool: Patient has a history of hemorrhoids as well as rectal prolapse.  Hemoccult cards are positive, but repeat testing was negative.  Continue to monitor closely. 9.  Weakness and fatigue: Chronic and unchanged.  Delay treatment 6 weeks as above. 10. Memory: Chronic and unchanged. 11. Reflux: Continue Nexium as needed. 12.  Leukocytosis: Mild, likely reactive.  Monitor.  Patient expressed understanding and was in agreement with this plan. She also understands that She can call clinic at any time with any questions, concerns, or complaints.   Cancer Staging Malignant neoplasm of ovary [facility]) Staging form: Ovary, Fallopian Tube, and Primary Peritoneal Carcinoma, AJCC 8th Edition - Clinical stage from 05/29/2017: Stage IIIC (cT3c, cN1b, cM0) - Signed by Lloyd Huger, MD on 05/29/2017   Lloyd Huger, MD   08/30/2020 9:50 AM

## 2020-08-28 ENCOUNTER — Other Ambulatory Visit: Payer: Self-pay | Admitting: *Deleted

## 2020-08-28 DIAGNOSIS — C569 Malignant neoplasm of unspecified ovary: Secondary | ICD-10-CM

## 2020-08-29 ENCOUNTER — Other Ambulatory Visit: Payer: Self-pay

## 2020-08-29 ENCOUNTER — Inpatient Hospital Stay: Payer: Medicare PPO | Attending: Oncology | Admitting: Oncology

## 2020-08-29 ENCOUNTER — Inpatient Hospital Stay: Payer: Medicare PPO

## 2020-08-29 VITALS — BP 161/83 | HR 90 | Temp 98.0°F | Wt 92.7 lb

## 2020-08-29 DIAGNOSIS — K219 Gastro-esophageal reflux disease without esophagitis: Secondary | ICD-10-CM | POA: Diagnosis not present

## 2020-08-29 DIAGNOSIS — D649 Anemia, unspecified: Secondary | ICD-10-CM | POA: Diagnosis not present

## 2020-08-29 DIAGNOSIS — Z5111 Encounter for antineoplastic chemotherapy: Secondary | ICD-10-CM | POA: Diagnosis not present

## 2020-08-29 DIAGNOSIS — G8929 Other chronic pain: Secondary | ICD-10-CM | POA: Insufficient documentation

## 2020-08-29 DIAGNOSIS — C569 Malignant neoplasm of unspecified ovary: Secondary | ICD-10-CM | POA: Insufficient documentation

## 2020-08-29 DIAGNOSIS — K5900 Constipation, unspecified: Secondary | ICD-10-CM | POA: Insufficient documentation

## 2020-08-29 DIAGNOSIS — Z79899 Other long term (current) drug therapy: Secondary | ICD-10-CM | POA: Diagnosis not present

## 2020-08-29 DIAGNOSIS — M1990 Unspecified osteoarthritis, unspecified site: Secondary | ICD-10-CM | POA: Diagnosis not present

## 2020-08-29 DIAGNOSIS — I10 Essential (primary) hypertension: Secondary | ICD-10-CM | POA: Diagnosis not present

## 2020-08-29 DIAGNOSIS — Z801 Family history of malignant neoplasm of trachea, bronchus and lung: Secondary | ICD-10-CM | POA: Insufficient documentation

## 2020-08-29 DIAGNOSIS — R109 Unspecified abdominal pain: Secondary | ICD-10-CM | POA: Insufficient documentation

## 2020-08-29 DIAGNOSIS — E871 Hypo-osmolality and hyponatremia: Secondary | ICD-10-CM | POA: Diagnosis not present

## 2020-08-29 DIAGNOSIS — D72829 Elevated white blood cell count, unspecified: Secondary | ICD-10-CM | POA: Insufficient documentation

## 2020-08-29 DIAGNOSIS — R5383 Other fatigue: Secondary | ICD-10-CM | POA: Insufficient documentation

## 2020-08-29 LAB — COMPREHENSIVE METABOLIC PANEL
ALT: 15 U/L (ref 0–44)
AST: 41 U/L (ref 15–41)
Albumin: 3.2 g/dL — ABNORMAL LOW (ref 3.5–5.0)
Alkaline Phosphatase: 71 U/L (ref 38–126)
Anion gap: 6 (ref 5–15)
BUN: 34 mg/dL — ABNORMAL HIGH (ref 8–23)
CO2: 29 mmol/L (ref 22–32)
Calcium: 8.6 mg/dL — ABNORMAL LOW (ref 8.9–10.3)
Chloride: 100 mmol/L (ref 98–111)
Creatinine, Ser: 0.78 mg/dL (ref 0.44–1.00)
GFR, Estimated: >60 mL/min (ref >60)
Glucose, Bld: 93 mg/dL (ref 70–99)
Potassium: 4.4 mmol/L (ref 3.5–5.1)
Sodium: 135 mmol/L (ref 135–145)
Total Bilirubin: 0.4 mg/dL (ref 0.3–1.2)
Total Protein: 6.1 g/dL — ABNORMAL LOW (ref 6.5–8.1)

## 2020-08-29 LAB — CBC WITH DIFFERENTIAL/PLATELET
Abs Immature Granulocytes: 0.04 10*3/uL (ref 0.00–0.07)
Basophils Absolute: 0 10*3/uL (ref 0.0–0.1)
Basophils Relative: 0 %
Eosinophils Absolute: 0 10*3/uL (ref 0.0–0.5)
Eosinophils Relative: 0 %
HCT: 30.3 % — ABNORMAL LOW (ref 36.0–46.0)
Hemoglobin: 9.5 g/dL — ABNORMAL LOW (ref 12.0–15.0)
Immature Granulocytes: 0 %
Lymphocytes Relative: 8 %
Lymphs Abs: 1 10*3/uL (ref 0.7–4.0)
MCH: 28.9 pg (ref 26.0–34.0)
MCHC: 31.4 g/dL (ref 30.0–36.0)
MCV: 92.1 fL (ref 80.0–100.0)
Monocytes Absolute: 0.9 10*3/uL (ref 0.1–1.0)
Monocytes Relative: 7 %
Neutro Abs: 10.3 10*3/uL — ABNORMAL HIGH (ref 1.7–7.7)
Neutrophils Relative %: 85 %
Platelets: 385 10*3/uL (ref 150–400)
RBC: 3.29 MIL/uL — ABNORMAL LOW (ref 3.87–5.11)
RDW: 16.3 % — ABNORMAL HIGH (ref 11.5–15.5)
WBC: 12.3 10*3/uL — ABNORMAL HIGH (ref 4.0–10.5)
nRBC: 0 % (ref 0.0–0.2)

## 2020-08-29 MED ORDER — HEPARIN SOD (PORK) LOCK FLUSH 100 UNIT/ML IV SOLN
500.0000 [IU] | Freq: Once | INTRAVENOUS | Status: AC | PRN
  Administered 2020-08-29: 500 [IU]
  Filled 2020-08-29: qty 5

## 2020-08-29 MED ORDER — PROCHLORPERAZINE MALEATE 10 MG PO TABS
10.0000 mg | ORAL_TABLET | Freq: Once | ORAL | Status: AC
Start: 2020-08-29 — End: 2020-08-29
  Administered 2020-08-29: 10 mg via ORAL

## 2020-08-29 MED ORDER — SODIUM CHLORIDE 0.9 % IV SOLN
Freq: Once | INTRAVENOUS | Status: AC
  Filled 2020-08-29: qty 250

## 2020-08-29 MED ORDER — SODIUM CHLORIDE 0.9 % IV SOLN
1000.0000 mg | Freq: Once | INTRAVENOUS | Status: AC
  Administered 2020-08-29: 988.5932 mg via INTRAVENOUS
  Filled 2020-08-29: qty 26

## 2020-08-29 MED ORDER — HEPARIN SOD (PORK) LOCK FLUSH 100 UNIT/ML IV SOLN
INTRAVENOUS | Status: AC
  Filled 2020-08-29: qty 5

## 2020-08-29 MED ORDER — SODIUM CHLORIDE 0.9% FLUSH
10.0000 mL | INTRAVENOUS | Status: DC | PRN
  Filled 2020-08-29: qty 10

## 2020-08-29 NOTE — Progress Notes (Signed)
Pt tolerated gemzar infusion well with no complications.  Pt left chemo suite stable and ambulatory with her walker.

## 2020-08-30 LAB — CA 125: Cancer Antigen (CA) 125: 539 U/mL — ABNORMAL HIGH (ref 0.0–38.1)

## 2020-08-31 NOTE — Progress Notes (Signed)
[place]  Telephone:(336) [phone] Fax:(336) [phone]  ID: Tamara Mckinney OB: Jun 07, 1928  MR#: [id number]  STM#:[id number]  Patient Care Team: [name], MD as PCP - General (Family Medicine) [name], RN as Registered Nurse Herbert Pun, MD as Consulting Physician (General Surgery) Lloyd Huger, MD as Consulting Physician (Oncology)  CHIEF COMPLAINT: Stage IIIC ovarian cancer.  INTERVAL HISTORY: Patient returns to clinic today for further evaluation and consideration of cycle 10, day 8 of single agent gemcitabine.  She currently feels well and is asymptomatic.  She continues to have chronic weakness and fatigue, but blames this on her age.  She has occasional constipation.  She also has occasional abdominal pain which is well controlled with Tylenol.  She has no neurologic complaints.  She has a fair appetite and her weight has remained stable.  She denies any chest pain, shortness of breath, cough, or hemoptysis.  She denies any nausea, vomiting, or diarrhea.  She does not complain of black tarry stools today.  She has no urinary complaints.  Patient offers no further specific complaints today.  REVIEW OF SYSTEMS:   Review of Systems  Constitutional: Negative for fever, malaise/fatigue and weight loss.  Respiratory: Negative.  Negative for cough and shortness of breath.   Cardiovascular: Negative.  Negative for chest pain and leg swelling.  Gastrointestinal: Negative.  Negative for abdominal pain, blood in stool, constipation, diarrhea, heartburn, melena, nausea and vomiting.  Genitourinary: Negative.  Negative for dysuria.  Musculoskeletal: Negative.  Negative for back pain and neck pain.  Skin: Negative.  Negative for itching and rash.  Neurological: Negative for dizziness, focal weakness and weakness.  Endo/Heme/Allergies: Does not bruise/bleed easily.  Psychiatric/Behavioral: Positive for memory loss. The patient is not  nervous/anxious.     As per HPI. Otherwise, a complete review of systems is negative.  PAST MEDICAL HISTORY: Past Medical History:  Diagnosis Date  . Anemia   . Arthritis   . Asthma   . Dyspnea   . GERD (gastroesophageal reflux disease)   . Hypertension   . Ovarian cancer ([place]) 2018    PAST SURGICAL HISTORY: Past Surgical History:  Procedure Laterality Date  . ABDOMINAL HYSTERECTOMY    . BREAST BIOPSY     x6  . BUNIONECTOMY Bilateral   . CATARACT EXTRACTION, BILATERAL    . FLEXIBLE SIGMOIDOSCOPY N/A 05/21/2017   Procedure: FLEXIBLE SIGMOIDOSCOPY;  Surgeon: Lin Landsman, MD;  Location: [facility] ENDOSCOPY;  Service: Gastroenterology;  Laterality: N/A;  . INCONTINENCE SURGERY    . PORTA CATH INSERTION N/A 06/16/2017   Procedure: PORTA CATH INSERTION;  Surgeon: Algernon Huxley, MD;  Location: [place] [facility];  Service: Cardiovascular;  Laterality: N/A;  . RECTAL SURGERY  04/2018  . REPAIR OF RECTAL PROLAPSE N/A 05/11/2017   Procedure: REPAIR OF RECTAL PROLAPSE;  Surgeon: Leonie Green, MD;  Location: [facility] ORS;  Service: General;  Laterality: N/A;  . TEE WITHOUT CARDIOVERSION N/A 03/22/2020   Procedure: TRANSESOPHAGEAL ECHOCARDIOGRAM (TEE);  Surgeon: Nelva Bush, MD;  Location: [facility] ORS;  Service: Cardiovascular;  Laterality: N/A;  . TONSILLECTOMY    . VAGINA SURGERY     uncertain procedure performed    FAMILY HISTORY: Family History  Problem Relation Age of Onset  . Heart disease Mother   . Heart disease Father   . Heart disease Sister   . Heart attack Sister   . Ulcerative colitis Brother   . Lung cancer Brother   . Thyroid cancer Sister   .  Asthma Sister   . Diabetes Sister   . Asthma Sister   . Pancreatic cancer Sister   . Dementia Sister   . Asthma Brother   . Heart disease Brother   . Asthma Brother   . Lung cancer Brother   . Asthma Brother   . Lung cancer Brother   . Lung cancer Brother   . Rheum arthritis Brother     ADVANCED DIRECTIVES  (Y/N):  N  HEALTH MAINTENANCE: Social History   Tobacco Use  . Smoking status: Never Smoker  . Smokeless tobacco: Never Used  Vaping Use  . Vaping Use: Never used  Substance Use Topics  . Alcohol use: No  . Drug use: No     Colonoscopy:  PAP:  Bone density:  Lipid panel:  No Known Allergies  Current Outpatient Medications  Medication Sig Dispense Refill  . acetaminophen (TYLENOL) 500 MG tablet Take 500-1,000 mg by mouth every 6 (six) hours as needed for mild pain, moderate pain or fever.     . feeding supplement, ENSURE ENLIVE, (ENSURE ENLIVE) LIQD Take 237 mLs by mouth 2 (two) times daily between meals. 237 mL 12  . iron polysaccharides (NIFEREX) 150 MG capsule Take 1 capsule (150 mg total) by mouth daily. 90 capsule 1  . lidocaine-prilocaine (EMLA) cream APPLY A SMALL AMOUNT TO PORT SITE AT LEAST 1 HOUR PRIOR TO IT BEING ACCESSED THEN COVER WITH PLASTIC WRAP 30 g 0  . montelukast (SINGULAIR) 10 MG tablet Take 10 mg by mouth at bedtime as needed (allergy symptoms).     . polyethylene glycol (MIRALAX / GLYCOLAX) packet Take 17 g by mouth daily as needed for mild constipation or moderate constipation.     . valsartan (DIOVAN) 160 MG tablet Take 160 mg by mouth as needed.    . cyclobenzaprine (FLEXERIL) 10 MG tablet TAKE ONE TABLET BY MOUTH THREE TIMES A DAY AS NEEDED (Patient not taking: No sig reported) 30 tablet 0  . magic mouthwash w/lidocaine SOLN Take 5 mLs by mouth 4 (four) times daily as needed for mouth pain. (Patient not taking: No sig reported) 240 mL 1  . ondansetron (ZOFRAN) 4 MG tablet Take 1 tablet (4 mg total) by mouth every 6 (six) hours as needed for nausea. (Patient not taking: Reported on 09/05/2020) 20 tablet 0   No current facility-administered medications for this visit.   Facility-Administered Medications Ordered in Other Visits  Medication Dose Route Frequency Provider Last Rate Last Admin  . sodium chloride flush (NS) 0.9 % injection 10 mL  10 mL  Intravenous PRN Lloyd Huger, MD   10 mL at 03/10/18 1200    OBJECTIVE: Vitals:   09/05/20 1329  BP: 135/83  Pulse: (!) 103  Temp: 97.9 F (36.6 C)     Body mass index is 17.65 kg/m.    ECOG FS:0 - Asymptomatic  General: Thin, no acute distress. Eyes: Pink conjunctiva, anicteric sclera. HEENT: Normocephalic, moist mucous membranes. Lungs: No audible wheezing or coughing. Heart: Regular rate and rhythm. Abdomen: Soft, nontender, no obvious distention. Musculoskeletal: No edema, cyanosis, or clubbing. Neuro: Alert, answering all questions appropriately. Cranial nerves grossly intact. Skin: No rashes or petechiae noted. Psych: Normal affect.   LAB RESULTS:  Lab Results  Component Value Date   NA 134 (L) 09/05/2020   K 4.0 09/05/2020   CL 101 09/05/2020   CO2 24 09/05/2020   GLUCOSE 135 (H) 09/05/2020   BUN 26 (H) 09/05/2020   CREATININE 0.78 09/05/2020  CALCIUM 8.5 (L) 09/05/2020   PROT 6.3 (L) 09/05/2020   ALBUMIN 3.2 (L) 09/05/2020   AST 40 09/05/2020   ALT 19 09/05/2020   ALKPHOS 74 09/05/2020   BILITOT 0.5 09/05/2020   GFRNONAA >60 09/05/2020   GFRAA >60 05/16/2020    Lab Results  Component Value Date   WBC 5.7 09/05/2020   NEUTROABS 4.6 09/05/2020   HGB 9.3 (L) 09/05/2020   HCT 29.0 (L) 09/05/2020   MCV 91.5 09/05/2020   PLT 283 09/05/2020     STUDIES: No results found.  ASSESSMENT: Stage IIIC ovarian cancer.  PLAN:    1. Stage IIIC ovarian cancer: Patient is now considered platinum refractory and is receiving single agent gemcitabine. Plan to give treatment on days 1, 8, and 15 with a 22 off. CT scan results from May 06, 2020 reviewed independently with progression of disease.  Since taking time off, patient's CA-125 has trended up to 539.0, but has declined to 460.0 with just 1 infusion.  We previously discussed discontinuing treatments, but patient wishes to continue to pursue single agent gemcitabine at this time.  Proceed with  cycle 10, day 8 of treatment today.  Return to clinic in 1 week for further evaluation and consideration of cycle 10, day 15.      2.  Liver abscess: Improved per CT scan.  Appreciate infectious disease input.  Patient has now completed IV antibiotics.  Proceed with treatment as above.   3. Lupus anticoagulant: Patient was noted to have an elevated PTT as well as increased bleeding during her surgery for rectal prolapse.   4. Anemia: Chronic and unchanged.  Multifactorial including heme positive stools secondary to GI bleed as well as chemotherapy.  Given patient's age and comorbidities, will not refer for EGD or colonoscopy at this time, but if there is a significant bleed or drop in hemoglobin may reconsider.  Hemoglobin is 9.3 today. 5.  Constipation: Patient does not complain of this today.  Continue OTC remedies as needed. 6.  Hyponatremia: Mild, monitor. 7.  Back/abdominal pain: Patient states tramadol is "too strong".  Continue Tylenol as needed.  Imaging as above. 8.  Blood in stool: Patient has a history of hemorrhoids as well as rectal prolapse.  Hemoccult cards are positive, but repeat testing was negative.  Continue to monitor closely. 9.  Weakness and fatigue: Chronic and unchanged.   10. Memory: Chronic and unchanged. 11. Reflux: Continue Nexium as needed. 12.  Leukocytosis: Resolved.  Patient expressed understanding and was in agreement with this plan. She also understands that She can call clinic at any time with any questions, concerns, or complaints.   Cancer Staging Malignant neoplasm of ovary [facility]) Staging form: Ovary, Fallopian Tube, and Primary Peritoneal Carcinoma, AJCC 8th Edition - Clinical stage from 05/29/2017: Stage IIIC (cT3c, cN1b, cM0) - Signed by Lloyd Huger, MD on 05/29/2017   Lloyd Huger, MD   09/07/2020 12:06 PM

## 2020-09-05 ENCOUNTER — Inpatient Hospital Stay: Payer: Medicare PPO

## 2020-09-05 ENCOUNTER — Encounter: Payer: Self-pay | Admitting: Oncology

## 2020-09-05 ENCOUNTER — Inpatient Hospital Stay (HOSPITAL_BASED_OUTPATIENT_CLINIC_OR_DEPARTMENT_OTHER): Payer: Medicare PPO | Admitting: Oncology

## 2020-09-05 VITALS — BP 135/83 | HR 103 | Temp 97.9°F | Wt 93.4 lb

## 2020-09-05 DIAGNOSIS — C569 Malignant neoplasm of unspecified ovary: Secondary | ICD-10-CM

## 2020-09-05 DIAGNOSIS — Z5111 Encounter for antineoplastic chemotherapy: Secondary | ICD-10-CM | POA: Diagnosis not present

## 2020-09-05 LAB — COMPREHENSIVE METABOLIC PANEL
ALT: 19 U/L (ref 0–44)
AST: 40 U/L (ref 15–41)
Albumin: 3.2 g/dL — ABNORMAL LOW (ref 3.5–5.0)
Alkaline Phosphatase: 74 U/L (ref 38–126)
Anion gap: 9 (ref 5–15)
BUN: 26 mg/dL — ABNORMAL HIGH (ref 8–23)
CO2: 24 mmol/L (ref 22–32)
Calcium: 8.5 mg/dL — ABNORMAL LOW (ref 8.9–10.3)
Chloride: 101 mmol/L (ref 98–111)
Creatinine, Ser: 0.78 mg/dL (ref 0.44–1.00)
GFR, Estimated: >60 mL/min (ref >60)
Glucose, Bld: 135 mg/dL — ABNORMAL HIGH (ref 70–99)
Potassium: 4 mmol/L (ref 3.5–5.1)
Sodium: 134 mmol/L — ABNORMAL LOW (ref 135–145)
Total Bilirubin: 0.5 mg/dL (ref 0.3–1.2)
Total Protein: 6.3 g/dL — ABNORMAL LOW (ref 6.5–8.1)

## 2020-09-05 LAB — CBC WITH DIFFERENTIAL/PLATELET
Abs Immature Granulocytes: 0.01 10*3/uL (ref 0.00–0.07)
Basophils Absolute: 0 10*3/uL (ref 0.0–0.1)
Basophils Relative: 0 %
Eosinophils Absolute: 0 10*3/uL (ref 0.0–0.5)
Eosinophils Relative: 0 %
HCT: 29 % — ABNORMAL LOW (ref 36.0–46.0)
Hemoglobin: 9.3 g/dL — ABNORMAL LOW (ref 12.0–15.0)
Immature Granulocytes: 0 %
Lymphocytes Relative: 13 %
Lymphs Abs: 0.7 10*3/uL (ref 0.7–4.0)
MCH: 29.3 pg (ref 26.0–34.0)
MCHC: 32.1 g/dL (ref 30.0–36.0)
MCV: 91.5 fL (ref 80.0–100.0)
Monocytes Absolute: 0.4 10*3/uL (ref 0.1–1.0)
Monocytes Relative: 6 %
Neutro Abs: 4.6 10*3/uL (ref 1.7–7.7)
Neutrophils Relative %: 81 %
Platelets: 283 10*3/uL (ref 150–400)
RBC: 3.17 MIL/uL — ABNORMAL LOW (ref 3.87–5.11)
RDW: 15.7 % — ABNORMAL HIGH (ref 11.5–15.5)
WBC: 5.7 10*3/uL (ref 4.0–10.5)
nRBC: 0 % (ref 0.0–0.2)

## 2020-09-05 MED ORDER — SODIUM CHLORIDE 0.9% FLUSH
10.0000 mL | Freq: Once | INTRAVENOUS | Status: AC
  Administered 2020-09-05: 10 mL via INTRAVENOUS
  Filled 2020-09-05: qty 10

## 2020-09-05 MED ORDER — PROCHLORPERAZINE MALEATE 10 MG PO TABS
10.0000 mg | ORAL_TABLET | Freq: Once | ORAL | Status: AC
  Administered 2020-09-05: 10 mg via ORAL
  Filled 2020-09-05: qty 1

## 2020-09-05 MED ORDER — SODIUM CHLORIDE 0.9 % IV SOLN
Freq: Once | INTRAVENOUS | Status: AC
  Filled 2020-09-05: qty 250

## 2020-09-05 MED ORDER — HEPARIN SOD (PORK) LOCK FLUSH 100 UNIT/ML IV SOLN
500.0000 [IU] | Freq: Once | INTRAVENOUS | Status: AC
  Administered 2020-09-05: 500 [IU] via INTRAVENOUS
  Filled 2020-09-05: qty 5

## 2020-09-05 MED ORDER — HEPARIN SOD (PORK) LOCK FLUSH 100 UNIT/ML IV SOLN
INTRAVENOUS | Status: AC
  Filled 2020-09-05: qty 5

## 2020-09-05 MED ORDER — GEMCITABINE HCL CHEMO INJECTION 1 GM/26.3ML
1000.0000 mg | Freq: Once | INTRAVENOUS | Status: AC
  Administered 2020-09-05: 988.5932 mg via INTRAVENOUS
  Filled 2020-09-05: qty 26

## 2020-09-05 NOTE — Progress Notes (Signed)
HR 103 ok to proceed

## 2020-09-06 LAB — CA 125: Cancer Antigen (CA) 125: 460 U/mL — ABNORMAL HIGH (ref 0.0–38.1)

## 2020-09-07 NOTE — Progress Notes (Signed)
[place]  Telephone:(336) [phone] Fax:(336) [phone]  ID: Tamara Mckinney OB: July 21, 1928  MR#: [id number]  ALP#:[id number]  Patient Care Team: [name], MD as PCP - General (Family Medicine) [name], RN as Registered Nurse Herbert Pun, MD as Consulting Physician (General Surgery) Lloyd Huger, MD as Consulting Physician (Oncology)  CHIEF COMPLAINT: Stage IIIC ovarian cancer.  INTERVAL HISTORY: Patient returns to clinic today for further evaluation and consideration of cycle 10, day 15 of single agent gemcitabine.  She complains of mild increase in back pain, but otherwise feels well.  She continues to have chronic weakness and fatigue, but blames this on her age.  She has occasional constipation.  She also has occasional abdominal pain which is well controlled with Tylenol.  She has no neurologic complaints.  She has a fair appetite and her weight has remained stable.  She denies any chest pain, shortness of breath, cough, or hemoptysis.  She denies any nausea, vomiting, or diarrhea.  She does not complain of black tarry stools today.  She has no urinary complaints.  Patient offers no further specific complaints today.  REVIEW OF SYSTEMS:   Review of Systems  Constitutional: Negative for fever, malaise/fatigue and weight loss.  Respiratory: Negative.  Negative for cough and shortness of breath.   Cardiovascular: Negative.  Negative for chest pain and leg swelling.  Gastrointestinal: Negative.  Negative for abdominal pain, blood in stool, constipation, diarrhea, heartburn, melena, nausea and vomiting.  Genitourinary: Negative.  Negative for dysuria.  Musculoskeletal: Positive for back pain. Negative for neck pain.  Skin: Negative.  Negative for itching and rash.  Neurological: Negative for dizziness, focal weakness and weakness.  Endo/Heme/Allergies: Does not bruise/bleed easily.  Psychiatric/Behavioral: Positive for memory loss. The  patient is not nervous/anxious.     As per HPI. Otherwise, a complete review of systems is negative.  PAST MEDICAL HISTORY: Past Medical History:  Diagnosis Date  . Anemia   . Arthritis   . Asthma   . Dyspnea   . GERD (gastroesophageal reflux disease)   . Hypertension   . Ovarian cancer ([place]) 2018    PAST SURGICAL HISTORY: Past Surgical History:  Procedure Laterality Date  . ABDOMINAL HYSTERECTOMY    . BREAST BIOPSY     x6  . BUNIONECTOMY Bilateral   . CATARACT EXTRACTION, BILATERAL    . FLEXIBLE SIGMOIDOSCOPY N/A 05/21/2017   Procedure: FLEXIBLE SIGMOIDOSCOPY;  Surgeon: Lin Landsman, MD;  Location: [facility] ENDOSCOPY;  Service: Gastroenterology;  Laterality: N/A;  . INCONTINENCE SURGERY    . PORTA CATH INSERTION N/A 06/16/2017   Procedure: PORTA CATH INSERTION;  Surgeon: Algernon Huxley, MD;  Location: [place] CV LAB;  Service: Cardiovascular;  Laterality: N/A;  . RECTAL SURGERY  04/2018  . REPAIR OF RECTAL PROLAPSE N/A 05/11/2017   Procedure: REPAIR OF RECTAL PROLAPSE;  Surgeon: Leonie Green, MD;  Location: [facility] ORS;  Service: General;  Laterality: N/A;  . TEE WITHOUT CARDIOVERSION N/A 03/22/2020   Procedure: TRANSESOPHAGEAL ECHOCARDIOGRAM (TEE);  Surgeon: Nelva Bush, MD;  Location: [facility] ORS;  Service: Cardiovascular;  Laterality: N/A;  . TONSILLECTOMY    . VAGINA SURGERY     uncertain procedure performed    FAMILY HISTORY: Family History  Problem Relation Age of Onset  . Heart disease Mother   . Heart disease Father   . Heart disease Sister   . Heart attack Sister   . Ulcerative colitis Brother   . Lung cancer Brother   .  Thyroid cancer Sister   . Asthma Sister   . Diabetes Sister   . Asthma Sister   . Pancreatic cancer Sister   . Dementia Sister   . Asthma Brother   . Heart disease Brother   . Asthma Brother   . Lung cancer Brother   . Asthma Brother   . Lung cancer Brother   . Lung cancer Brother   . Rheum arthritis Brother      ADVANCED DIRECTIVES (Y/N):  N  HEALTH MAINTENANCE: Social History   Tobacco Use  . Smoking status: Never Smoker  . Smokeless tobacco: Never Used  Vaping Use  . Vaping Use: Never used  Substance Use Topics  . Alcohol use: No  . Drug use: No     Colonoscopy:  PAP:  Bone density:  Lipid panel:  No Known Allergies  Current Outpatient Medications  Medication Sig Dispense Refill  . acetaminophen (TYLENOL) 500 MG tablet Take 500-1,000 mg by mouth every 6 (six) hours as needed for mild pain, moderate pain or fever.     . feeding supplement, ENSURE ENLIVE, (ENSURE ENLIVE) LIQD Take 237 mLs by mouth 2 (two) times daily between meals. 237 mL 12  . iron polysaccharides (NIFEREX) 150 MG capsule Take 1 capsule (150 mg total) by mouth daily. 90 capsule 1  . lidocaine-prilocaine (EMLA) cream APPLY A SMALL AMOUNT TO PORT SITE AT LEAST 1 HOUR PRIOR TO IT BEING ACCESSED THEN COVER WITH PLASTIC WRAP 30 g 0  . polyethylene glycol (MIRALAX / GLYCOLAX) packet Take 17 g by mouth daily as needed for mild constipation or moderate constipation.     . cyclobenzaprine (FLEXERIL) 10 MG tablet TAKE ONE TABLET BY MOUTH THREE TIMES A DAY AS NEEDED (Patient not taking: No sig reported) 30 tablet 0  . magic mouthwash w/lidocaine SOLN Take 5 mLs by mouth 4 (four) times daily as needed for mouth pain. (Patient not taking: No sig reported) 240 mL 1  . montelukast (SINGULAIR) 10 MG tablet Take 10 mg by mouth at bedtime as needed (allergy symptoms).  (Patient not taking: Reported on 09/12/2020)    . ondansetron (ZOFRAN) 4 MG tablet Take 1 tablet (4 mg total) by mouth every 6 (six) hours as needed for nausea. (Patient not taking: No sig reported) 20 tablet 0  . valsartan (DIOVAN) 160 MG tablet Take 160 mg by mouth as needed. (Patient not taking: Reported on 09/12/2020)     No current facility-administered medications for this visit.   Facility-Administered Medications Ordered in Other Visits  Medication Dose Route  Frequency Provider Last Rate Last Admin  . gemcitabine (GEMZAR) 988.5932 mg in sodium chloride 0.9 % 250 mL chemo infusion  988.5932 mg Intravenous Once Lloyd Huger, MD      . heparin lock flush 100 unit/mL  500 Units Intracatheter Once PRN Lloyd Huger, MD      . sodium chloride flush (NS) 0.9 % injection 10 mL  10 mL Intravenous PRN Lloyd Huger, MD   10 mL at 03/10/18 1200    OBJECTIVE: Vitals:   09/12/20 0951  BP: (!) 153/81  Pulse: 95  Resp: 16  Temp: 98.8 F (37.1 C)  SpO2: 96%     Body mass index is 17.59 kg/m.    ECOG FS:0 - Asymptomatic  General: Thin, no acute distress. Eyes: Pink conjunctiva, anicteric sclera. HEENT: Normocephalic, moist mucous membranes. Lungs: No audible wheezing or coughing. Heart: Regular rate and rhythm. Abdomen: Soft, nontender, no obvious distention. Musculoskeletal: No  edema, cyanosis, or clubbing. Neuro: Alert, answering all questions appropriately. Cranial nerves grossly intact. Skin: No rashes or petechiae noted. Psych: Normal affect.   LAB RESULTS:  Lab Results  Component Value Date   NA 132 (L) 09/12/2020   K 4.1 09/12/2020   CL 99 09/12/2020   CO2 24 09/12/2020   GLUCOSE 126 (H) 09/12/2020   BUN 25 (H) 09/12/2020   CREATININE 0.72 09/12/2020   CALCIUM 8.3 (L) 09/12/2020   PROT 5.9 (L) 09/12/2020   ALBUMIN 3.0 (L) 09/12/2020   AST 37 09/12/2020   ALT 19 09/12/2020   ALKPHOS 64 09/12/2020   BILITOT 0.4 09/12/2020   GFRNONAA >60 09/12/2020   GFRAA >60 05/16/2020    Lab Results  Component Value Date   WBC 4.7 09/12/2020   NEUTROABS 3.7 09/12/2020   HGB 8.7 (L) 09/12/2020   HCT 27.0 (L) 09/12/2020   MCV 92.2 09/12/2020   PLT 161 09/12/2020     STUDIES: No results found.  ASSESSMENT: Stage IIIC ovarian cancer.  PLAN:    1. Stage IIIC ovarian cancer: Patient is now considered platinum refractory and is receiving single agent gemcitabine. Plan to give treatment on days 1, 8, and 15 with a 22  off. CT scan results from May 06, 2020 reviewed independently with progression of disease.  Since taking time off, patient's CA-125 has trended up to 539.0, but has declined to 460.0 with just 1 infusion.  We previously discussed discontinuing treatments, but patient wishes to continue to pursue single agent gemcitabine at this time.  Proceed with cycle 10, day 15 of treatment today.  Return to clinic in 2 weeks for further evaluation and consideration of cycle 11, day 1.   2.  Liver abscess: Improved per CT scan.  Appreciate infectious disease input.  Patient has now completed IV antibiotics.  Proceed with treatment as above.   3. Lupus anticoagulant: Patient was noted to have an elevated PTT as well as increased bleeding during her surgery for rectal prolapse.   4. Anemia: Chronic and unchanged.  Multifactorial including heme positive stools secondary to GI bleed as well as chemotherapy.  Given patient's age and comorbidities, will not refer for EGD or colonoscopy at this time, but if there is a significant bleed or drop in hemoglobin may reconsider.  Hemoglobin trended down slightly to 8.7 today. 5.  Constipation: Patient does not complain of this today.  Continue OTC remedies as needed. 6.  Hyponatremia: Chronic and unchanged. 7.  Back/abdominal pain: Chronic and unchanged.  Patient states tramadol is "too strong", but agreed to take it if her symptoms get worse.  Continue Tylenol as needed.  Imaging as above. 8.  Blood in stool: Patient has a history of hemorrhoids as well as rectal prolapse.  Hemoccult cards are positive, but repeat testing was negative.  Continue to monitor closely. 9.  Weakness and fatigue: Chronic and unchanged.   10. Memory: Chronic and unchanged. 11. Reflux: Continue Nexium as needed. 12.  Leukocytosis: Resolved.  I spent a total of 30 minutes reviewing chart data, face-to-face evaluation with the patient, counseling and coordination of care as detailed  above.  Patient expressed understanding and was in agreement with this plan. She also understands that She can call clinic at any time with any questions, concerns, or complaints.   Cancer Staging Malignant neoplasm of ovary [facility]) Staging form: Ovary, Fallopian Tube, and Primary Peritoneal Carcinoma, AJCC 8th Edition - Clinical stage from 05/29/2017: Stage IIIC (cT3c, cN1b, cM0) - Signed by  Lloyd Huger, MD on 05/29/2017   Lloyd Huger, MD   09/12/2020 11:00 AM

## 2020-09-12 ENCOUNTER — Inpatient Hospital Stay (HOSPITAL_BASED_OUTPATIENT_CLINIC_OR_DEPARTMENT_OTHER): Payer: Medicare PPO | Admitting: Oncology

## 2020-09-12 ENCOUNTER — Encounter: Payer: Self-pay | Admitting: Oncology

## 2020-09-12 ENCOUNTER — Inpatient Hospital Stay: Payer: Medicare PPO

## 2020-09-12 ENCOUNTER — Other Ambulatory Visit: Payer: Self-pay

## 2020-09-12 VITALS — BP 153/81 | HR 95 | Temp 98.8°F | Resp 16 | Wt 93.1 lb

## 2020-09-12 DIAGNOSIS — C569 Malignant neoplasm of unspecified ovary: Secondary | ICD-10-CM

## 2020-09-12 DIAGNOSIS — Z5111 Encounter for antineoplastic chemotherapy: Secondary | ICD-10-CM | POA: Diagnosis not present

## 2020-09-12 LAB — COMPREHENSIVE METABOLIC PANEL
ALT: 19 U/L (ref 0–44)
AST: 37 U/L (ref 15–41)
Albumin: 3 g/dL — ABNORMAL LOW (ref 3.5–5.0)
Alkaline Phosphatase: 64 U/L (ref 38–126)
Anion gap: 9 (ref 5–15)
BUN: 25 mg/dL — ABNORMAL HIGH (ref 8–23)
CO2: 24 mmol/L (ref 22–32)
Calcium: 8.3 mg/dL — ABNORMAL LOW (ref 8.9–10.3)
Chloride: 99 mmol/L (ref 98–111)
Creatinine, Ser: 0.72 mg/dL (ref 0.44–1.00)
GFR, Estimated: >60 mL/min (ref >60)
Glucose, Bld: 126 mg/dL — ABNORMAL HIGH (ref 70–99)
Potassium: 4.1 mmol/L (ref 3.5–5.1)
Sodium: 132 mmol/L — ABNORMAL LOW (ref 135–145)
Total Bilirubin: 0.4 mg/dL (ref 0.3–1.2)
Total Protein: 5.9 g/dL — ABNORMAL LOW (ref 6.5–8.1)

## 2020-09-12 LAB — CBC WITH DIFFERENTIAL/PLATELET
Abs Immature Granulocytes: 0.01 10*3/uL (ref 0.00–0.07)
Basophils Absolute: 0 10*3/uL (ref 0.0–0.1)
Basophils Relative: 0 %
Eosinophils Absolute: 0 10*3/uL (ref 0.0–0.5)
Eosinophils Relative: 0 %
HCT: 27 % — ABNORMAL LOW (ref 36.0–46.0)
Hemoglobin: 8.7 g/dL — ABNORMAL LOW (ref 12.0–15.0)
Immature Granulocytes: 0 %
Lymphocytes Relative: 13 %
Lymphs Abs: 0.6 10*3/uL — ABNORMAL LOW (ref 0.7–4.0)
MCH: 29.7 pg (ref 26.0–34.0)
MCHC: 32.2 g/dL (ref 30.0–36.0)
MCV: 92.2 fL (ref 80.0–100.0)
Monocytes Absolute: 0.3 10*3/uL (ref 0.1–1.0)
Monocytes Relative: 7 %
Neutro Abs: 3.7 10*3/uL (ref 1.7–7.7)
Neutrophils Relative %: 80 %
Platelets: 161 10*3/uL (ref 150–400)
RBC: 2.93 MIL/uL — ABNORMAL LOW (ref 3.87–5.11)
RDW: 15.9 % — ABNORMAL HIGH (ref 11.5–15.5)
WBC: 4.7 10*3/uL (ref 4.0–10.5)
nRBC: 0 % (ref 0.0–0.2)

## 2020-09-12 MED ORDER — SODIUM CHLORIDE 0.9% FLUSH
10.0000 mL | Freq: Once | INTRAVENOUS | Status: AC
  Administered 2020-09-12: 10 mL via INTRAVENOUS
  Filled 2020-09-12: qty 10

## 2020-09-12 MED ORDER — SODIUM CHLORIDE 0.9 % IV SOLN
1000.0000 mg | Freq: Once | INTRAVENOUS | Status: AC
  Administered 2020-09-12: 988.5932 mg via INTRAVENOUS
  Filled 2020-09-12: qty 26

## 2020-09-12 MED ORDER — PROCHLORPERAZINE MALEATE 10 MG PO TABS
10.0000 mg | ORAL_TABLET | Freq: Once | ORAL | Status: AC
Start: 2020-09-12 — End: 2020-09-12
  Administered 2020-09-12: 10 mg via ORAL
  Filled 2020-09-12: qty 1

## 2020-09-12 MED ORDER — HEPARIN SOD (PORK) LOCK FLUSH 100 UNIT/ML IV SOLN
INTRAVENOUS | Status: AC
  Filled 2020-09-12: qty 5

## 2020-09-12 MED ORDER — HEPARIN SOD (PORK) LOCK FLUSH 100 UNIT/ML IV SOLN
500.0000 [IU] | Freq: Once | INTRAVENOUS | Status: AC | PRN
  Administered 2020-09-12: 500 [IU]
  Filled 2020-09-12: qty 5

## 2020-09-12 MED ORDER — SODIUM CHLORIDE 0.9 % IV SOLN
Freq: Once | INTRAVENOUS | Status: AC
  Filled 2020-09-12: qty 250

## 2020-09-12 NOTE — Progress Notes (Signed)
Pt and son in for follow up.  Pt reports pain in left mid back area, states has had pain for 2-3 months but continues to worsen and be bothersome.

## 2020-09-12 NOTE — Progress Notes (Signed)
[name] tolerated her treatment well today without any complications.

## 2020-09-13 LAB — CA 125: Cancer Antigen (CA) 125: 416 U/mL — ABNORMAL HIGH (ref 0.0–38.1)

## 2020-09-23 NOTE — Progress Notes (Signed)
[place]  Telephone:(336) [phone] Fax:(336) [phone]  ID: Tamara Mckinney OB: May 19, 1928  MR#: [id number]  [id number]  Patient Care Team: [name], MD as PCP - General (Family Medicine) [name], RN as Registered Nurse Herbert Pun, MD as Consulting Physician (General Surgery) Lloyd Huger, MD as Consulting Physician (Oncology)  CHIEF COMPLAINT: Stage IIIC ovarian cancer.  INTERVAL HISTORY: Patient returns to clinic today for further evaluation and consideration of cycle 11, day 1 of single agent gemcitabine.  Patient states she is "tired" and may consider discontinuing treatment in the future, but we will proceed as planned today.  She continues to have occasional back and abdominal pain which is well controlled with Tylenol.  She has intermittent constipation.  She has no neurologic complaints.  She has a fair appetite and her weight has remained stable.  She denies any chest pain, shortness of breath, cough, or hemoptysis.  She denies any nausea, vomiting, or diarrhea.  She does not complain of black tarry stools today.  She has no urinary complaints.  Patient offers no further specific complaints today.  REVIEW OF SYSTEMS:   Review of Systems  Constitutional: Positive for malaise/fatigue. Negative for fever and weight loss.  Respiratory: Negative.  Negative for cough and shortness of breath.   Cardiovascular: Negative.  Negative for chest pain and leg swelling.  Gastrointestinal: Negative.  Negative for abdominal pain, blood in stool, constipation, diarrhea, heartburn, melena, nausea and vomiting.  Genitourinary: Negative.  Negative for dysuria.  Musculoskeletal: Positive for back pain. Negative for neck pain.  Skin: Negative.  Negative for itching and rash.  Neurological: Negative for dizziness, focal weakness and weakness.  Endo/Heme/Allergies: Does not bruise/bleed easily.  Psychiatric/Behavioral: Positive for memory loss.  The patient is not nervous/anxious.     As per HPI. Otherwise, a complete review of systems is negative.  PAST MEDICAL HISTORY: Past Medical History:  Diagnosis Date  . Anemia   . Arthritis   . Asthma   . Dyspnea   . GERD (gastroesophageal reflux disease)   . Hypertension   . Ovarian cancer ([place]) 2018    PAST SURGICAL HISTORY: Past Surgical History:  Procedure Laterality Date  . ABDOMINAL HYSTERECTOMY    . BREAST BIOPSY     x6  . BUNIONECTOMY Bilateral   . CATARACT EXTRACTION, BILATERAL    . FLEXIBLE SIGMOIDOSCOPY N/A 05/21/2017   Procedure: FLEXIBLE SIGMOIDOSCOPY;  Surgeon: Lin Landsman, MD;  Location: [facility] ENDOSCOPY;  Service: Gastroenterology;  Laterality: N/A;  . INCONTINENCE SURGERY    . PORTA CATH INSERTION N/A 06/16/2017   Procedure: PORTA CATH INSERTION;  Surgeon: Algernon Huxley, MD;  Location: [place] CV LAB;  Service: Cardiovascular;  Laterality: N/A;  . RECTAL SURGERY  04/2018  . REPAIR OF RECTAL PROLAPSE N/A 05/11/2017   Procedure: REPAIR OF RECTAL PROLAPSE;  Surgeon: Leonie Green, MD;  Location: [facility] ORS;  Service: General;  Laterality: N/A;  . TEE WITHOUT CARDIOVERSION N/A 03/22/2020   Procedure: TRANSESOPHAGEAL ECHOCARDIOGRAM (TEE);  Surgeon: Nelva Bush, MD;  Location: [facility] ORS;  Service: Cardiovascular;  Laterality: N/A;  . TONSILLECTOMY    . VAGINA SURGERY     uncertain procedure performed    FAMILY HISTORY: Family History  Problem Relation Age of Onset  . Heart disease Mother   . Heart disease Father   . Heart disease Sister   . Heart attack Sister   . Ulcerative colitis Brother   . Lung cancer Brother   . Thyroid cancer  Sister   . Asthma Sister   . Diabetes Sister   . Asthma Sister   . Pancreatic cancer Sister   . Dementia Sister   . Asthma Brother   . Heart disease Brother   . Asthma Brother   . Lung cancer Brother   . Asthma Brother   . Lung cancer Brother   . Lung cancer Brother   . Rheum arthritis Brother      ADVANCED DIRECTIVES (Y/N):  N  HEALTH MAINTENANCE: Social History   Tobacco Use  . Smoking status: Never Smoker  . Smokeless tobacco: Never Used  Vaping Use  . Vaping Use: Never used  Substance Use Topics  . Alcohol use: No  . Drug use: No     Colonoscopy:  PAP:  Bone density:  Lipid panel:  No Known Allergies  Current Outpatient Medications  Medication Sig Dispense Refill  . acetaminophen (TYLENOL) 500 MG tablet Take 500-1,000 mg by mouth every 6 (six) hours as needed for mild pain, moderate pain or fever.     . feeding supplement, ENSURE ENLIVE, (ENSURE ENLIVE) LIQD Take 237 mLs by mouth 2 (two) times daily between meals. 237 mL 12  . iron polysaccharides (NIFEREX) 150 MG capsule Take 1 capsule (150 mg total) by mouth daily. 90 capsule 1  . lidocaine-prilocaine (EMLA) cream APPLY A SMALL AMOUNT TO PORT SITE AT LEAST 1 HOUR PRIOR TO IT BEING ACCESSED THEN COVER WITH PLASTIC WRAP 30 g 0  . polyethylene glycol (MIRALAX / GLYCOLAX) packet Take 17 g by mouth daily as needed for mild constipation or moderate constipation.     . traMADol (ULTRAM) 50 MG tablet Take by mouth every 6 (six) hours as needed.    . cyclobenzaprine (FLEXERIL) 10 MG tablet TAKE ONE TABLET BY MOUTH THREE TIMES A DAY AS NEEDED (Patient not taking: No sig reported) 30 tablet 0  . magic mouthwash w/lidocaine SOLN Take 5 mLs by mouth 4 (four) times daily as needed for mouth pain. (Patient not taking: No sig reported) 240 mL 1  . montelukast (SINGULAIR) 10 MG tablet Take 10 mg by mouth at bedtime as needed (allergy symptoms).  (Patient not taking: No sig reported)    . ondansetron (ZOFRAN) 4 MG tablet Take 1 tablet (4 mg total) by mouth every 6 (six) hours as needed for nausea. (Patient not taking: No sig reported) 20 tablet 0  . valsartan (DIOVAN) 160 MG tablet Take 160 mg by mouth as needed. (Patient not taking: No sig reported)     No current facility-administered medications for this visit.    Facility-Administered Medications Ordered in Other Visits  Medication Dose Route Frequency Provider Last Rate Last Admin  . sodium chloride flush (NS) 0.9 % injection 10 mL  10 mL Intravenous PRN Lloyd Huger, MD   10 mL at 03/10/18 1200    OBJECTIVE: Vitals:   09/26/20 1017  BP: 116/79  Pulse: 96  Resp: 18  Temp: 99.8 F (37.7 C)  SpO2: 92%     Body mass index is 17.84 kg/m.    ECOG FS:0 - Asymptomatic  General: Thin, no acute distress. Eyes: Pink conjunctiva, anicteric sclera. HEENT: Normocephalic, moist mucous membranes. Lungs: No audible wheezing or coughing. Heart: Regular rate and rhythm. Abdomen: Soft, nontender, no obvious distention. Musculoskeletal: No edema, cyanosis, or clubbing. Neuro: Alert, answering all questions appropriately. Cranial nerves grossly intact. Skin: No rashes or petechiae noted. Psych: Normal affect.   LAB RESULTS:  Lab Results  Component Value Date  NA 133 (L) 09/26/2020   K 4.6 09/26/2020   CL 98 09/26/2020   CO2 25 09/26/2020   GLUCOSE 94 09/26/2020   BUN 26 (H) 09/26/2020   CREATININE 0.92 09/26/2020   CALCIUM 8.3 (L) 09/26/2020   PROT 5.9 (L) 09/26/2020   ALBUMIN 2.9 (L) 09/26/2020   AST 34 09/26/2020   ALT 16 09/26/2020   ALKPHOS 69 09/26/2020   BILITOT 0.6 09/26/2020   GFRNONAA 59 (L) 09/26/2020   GFRAA >60 05/16/2020    Lab Results  Component Value Date   WBC 9.3 09/26/2020   NEUTROABS 7.2 09/26/2020   HGB 8.4 (L) 09/26/2020   HCT 26.1 (L) 09/26/2020   MCV 91.3 09/26/2020   PLT 595 (H) 09/26/2020     STUDIES: No results found.  ASSESSMENT: Stage IIIC ovarian cancer.  PLAN:    1. Stage IIIC ovarian cancer: Patient is now considered platinum refractory and is receiving single agent gemcitabine. Plan to give treatment on days 1, 8, and 15 with a 22 off. CT scan results from May 06, 2020 reviewed independently with progression of disease.  Since taking time off, patient's CA-125 has trended up to  539.0, but has declined to 416.  Patient stated she is "tired" and may want to discontinue treatment in the near future, but we will proceed as planned today.  Proceed with cycle 11, day 1.  Return to clinic in 1 week for further evaluation and consideration of cycle 11, day 8.   2.  Liver abscess: Improved per CT scan.  Appreciate infectious disease input.  Patient has now completed IV antibiotics.  Proceed with treatment as above.   3. Lupus anticoagulant: Patient was noted to have an elevated PTT as well as increased bleeding during her surgery for rectal prolapse.   4. Anemia: Chronic and unchanged.  Multifactorial including heme positive stools secondary to GI bleed as well as chemotherapy.  Given patient's age and comorbidities, will not refer for EGD or colonoscopy at this time, but if there is a significant bleed or drop in hemoglobin may reconsider.  Patient's hemoglobin is trended down slightly to 8.4 today. 5.  Constipation: Intermittent.  Continue OTC remedies as needed. 6.  Hyponatremia: Chronic and unchanged. 7.  Back/abdominal pain: Chronic and unchanged.  Patient states tramadol is "too strong", but agreed to take it if her symptoms get worse.  Continue Tylenol as needed.  Imaging as above. 8.  Blood in stool: Patient has a history of hemorrhoids as well as rectal prolapse.  Hemoccult cards are positive, but repeat testing was negative.  Continue to monitor closely. 9.  Weakness and fatigue: Chronic and unchanged.   10. Memory: Chronic and unchanged. 11. Reflux: Continue Nexium as needed. 12.  Leukocytosis: Resolved.  Patient expressed understanding and was in agreement with this plan. She also understands that She can call clinic at any time with any questions, concerns, or complaints.   Cancer Staging Malignant neoplasm of ovary [facility]) Staging form: Ovary, Fallopian Tube, and Primary Peritoneal Carcinoma, AJCC 8th Edition - Clinical stage from 05/29/2017: Stage IIIC (cT3c, cN1b,  cM0) - Signed by Lloyd Huger, MD on 05/29/2017   Lloyd Huger, MD   09/27/2020 5:35 AM

## 2020-09-26 ENCOUNTER — Inpatient Hospital Stay: Payer: Medicare PPO

## 2020-09-26 ENCOUNTER — Inpatient Hospital Stay (HOSPITAL_BASED_OUTPATIENT_CLINIC_OR_DEPARTMENT_OTHER): Payer: Medicare PPO | Admitting: Oncology

## 2020-09-26 ENCOUNTER — Inpatient Hospital Stay: Payer: Medicare PPO | Attending: Oncology

## 2020-09-26 ENCOUNTER — Encounter: Payer: Self-pay | Admitting: Oncology

## 2020-09-26 VITALS — BP 116/79 | HR 96 | Temp 99.8°F | Resp 18 | Wt 94.4 lb

## 2020-09-26 DIAGNOSIS — R42 Dizziness and giddiness: Secondary | ICD-10-CM | POA: Insufficient documentation

## 2020-09-26 DIAGNOSIS — C569 Malignant neoplasm of unspecified ovary: Secondary | ICD-10-CM

## 2020-09-26 DIAGNOSIS — Z8719 Personal history of other diseases of the digestive system: Secondary | ICD-10-CM | POA: Insufficient documentation

## 2020-09-26 DIAGNOSIS — K5900 Constipation, unspecified: Secondary | ICD-10-CM | POA: Diagnosis not present

## 2020-09-26 DIAGNOSIS — K219 Gastro-esophageal reflux disease without esophagitis: Secondary | ICD-10-CM | POA: Diagnosis not present

## 2020-09-26 DIAGNOSIS — E871 Hypo-osmolality and hyponatremia: Secondary | ICD-10-CM | POA: Diagnosis not present

## 2020-09-26 DIAGNOSIS — K921 Melena: Secondary | ICD-10-CM | POA: Insufficient documentation

## 2020-09-26 DIAGNOSIS — Z5111 Encounter for antineoplastic chemotherapy: Secondary | ICD-10-CM | POA: Diagnosis not present

## 2020-09-26 DIAGNOSIS — R5381 Other malaise: Secondary | ICD-10-CM | POA: Diagnosis not present

## 2020-09-26 DIAGNOSIS — R112 Nausea with vomiting, unspecified: Secondary | ICD-10-CM | POA: Insufficient documentation

## 2020-09-26 DIAGNOSIS — Z79899 Other long term (current) drug therapy: Secondary | ICD-10-CM | POA: Insufficient documentation

## 2020-09-26 DIAGNOSIS — D72829 Elevated white blood cell count, unspecified: Secondary | ICD-10-CM | POA: Insufficient documentation

## 2020-09-26 DIAGNOSIS — I10 Essential (primary) hypertension: Secondary | ICD-10-CM | POA: Insufficient documentation

## 2020-09-26 DIAGNOSIS — K750 Abscess of liver: Secondary | ICD-10-CM | POA: Diagnosis not present

## 2020-09-26 DIAGNOSIS — R109 Unspecified abdominal pain: Secondary | ICD-10-CM | POA: Insufficient documentation

## 2020-09-26 DIAGNOSIS — M549 Dorsalgia, unspecified: Secondary | ICD-10-CM | POA: Insufficient documentation

## 2020-09-26 DIAGNOSIS — M1990 Unspecified osteoarthritis, unspecified site: Secondary | ICD-10-CM | POA: Diagnosis not present

## 2020-09-26 DIAGNOSIS — D649 Anemia, unspecified: Secondary | ICD-10-CM | POA: Diagnosis not present

## 2020-09-26 DIAGNOSIS — R5383 Other fatigue: Secondary | ICD-10-CM | POA: Diagnosis not present

## 2020-09-26 LAB — COMPREHENSIVE METABOLIC PANEL
ALT: 16 U/L (ref 0–44)
AST: 34 U/L (ref 15–41)
Albumin: 2.9 g/dL — ABNORMAL LOW (ref 3.5–5.0)
Alkaline Phosphatase: 69 U/L (ref 38–126)
Anion gap: 10 (ref 5–15)
BUN: 26 mg/dL — ABNORMAL HIGH (ref 8–23)
CO2: 25 mmol/L (ref 22–32)
Calcium: 8.3 mg/dL — ABNORMAL LOW (ref 8.9–10.3)
Chloride: 98 mmol/L (ref 98–111)
Creatinine, Ser: 0.92 mg/dL (ref 0.44–1.00)
GFR, Estimated: 59 mL/min — ABNORMAL LOW (ref >60)
Glucose, Bld: 94 mg/dL (ref 70–99)
Potassium: 4.6 mmol/L (ref 3.5–5.1)
Sodium: 133 mmol/L — ABNORMAL LOW (ref 135–145)
Total Bilirubin: 0.6 mg/dL (ref 0.3–1.2)
Total Protein: 5.9 g/dL — ABNORMAL LOW (ref 6.5–8.1)

## 2020-09-26 LAB — CBC WITH DIFFERENTIAL/PLATELET
Abs Immature Granulocytes: 0.07 10*3/uL (ref 0.00–0.07)
Basophils Absolute: 0 10*3/uL (ref 0.0–0.1)
Basophils Relative: 0 %
Eosinophils Absolute: 0 10*3/uL (ref 0.0–0.5)
Eosinophils Relative: 0 %
HCT: 26.1 % — ABNORMAL LOW (ref 36.0–46.0)
Hemoglobin: 8.4 g/dL — ABNORMAL LOW (ref 12.0–15.0)
Immature Granulocytes: 1 %
Lymphocytes Relative: 8 %
Lymphs Abs: 0.7 10*3/uL (ref 0.7–4.0)
MCH: 29.4 pg (ref 26.0–34.0)
MCHC: 32.2 g/dL (ref 30.0–36.0)
MCV: 91.3 fL (ref 80.0–100.0)
Monocytes Absolute: 1.2 10*3/uL — ABNORMAL HIGH (ref 0.1–1.0)
Monocytes Relative: 13 %
Neutro Abs: 7.2 10*3/uL (ref 1.7–7.7)
Neutrophils Relative %: 78 %
Platelets: 595 10*3/uL — ABNORMAL HIGH (ref 150–400)
RBC: 2.86 MIL/uL — ABNORMAL LOW (ref 3.87–5.11)
RDW: 16.8 % — ABNORMAL HIGH (ref 11.5–15.5)
WBC: 9.3 10*3/uL (ref 4.0–10.5)
nRBC: 0 % (ref 0.0–0.2)

## 2020-09-26 MED ORDER — HEPARIN SOD (PORK) LOCK FLUSH 100 UNIT/ML IV SOLN
INTRAVENOUS | Status: AC
  Filled 2020-09-26: qty 5

## 2020-09-26 MED ORDER — HEPARIN SOD (PORK) LOCK FLUSH 100 UNIT/ML IV SOLN
500.0000 [IU] | Freq: Once | INTRAVENOUS | Status: AC | PRN
  Administered 2020-09-26: 500 [IU]
  Filled 2020-09-26: qty 5

## 2020-09-26 MED ORDER — SODIUM CHLORIDE 0.9 % IV SOLN
1000.0000 mg | Freq: Once | INTRAVENOUS | Status: AC
Start: 2020-09-26 — End: 2020-09-26
  Administered 2020-09-26: 988.5932 mg via INTRAVENOUS
  Filled 2020-09-26: qty 26

## 2020-09-26 MED ORDER — PROCHLORPERAZINE MALEATE 10 MG PO TABS
10.0000 mg | ORAL_TABLET | Freq: Once | ORAL | Status: AC
  Administered 2020-09-26: 10 mg via ORAL
  Filled 2020-09-26: qty 1

## 2020-09-26 MED ORDER — SODIUM CHLORIDE 0.9 % IV SOLN
Freq: Once | INTRAVENOUS | Status: AC
  Filled 2020-09-26: qty 250

## 2020-09-26 NOTE — Progress Notes (Unsigned)
Patient here for oncology follow-up appointment, expresses no complaints or concerns at this time.    

## 2020-09-27 LAB — CA 125: Cancer Antigen (CA) 125: 376 U/mL — ABNORMAL HIGH (ref 0.0–38.1)

## 2020-09-28 NOTE — Progress Notes (Signed)
[place]  [phone] [phone]  ID: Tamara Mckinney OB: [birth year]  MR#: [id number]  FMB#:[id number]  Patient Care Team: [name], MD as PCP - General (Family Medicine) [name], RN as Registered Nurse Herbert Pun, MD as Consulting Physician (General Surgery) Lloyd Huger, MD as Consulting Physician (Oncology)  CHIEF COMPLAINT: Stage IIIC ovarian cancer.  INTERVAL HISTORY: Patient returns to clinic today for further evaluation and consideration of cycle 11, day 8 of single agent gemcitabine.  She is having a significant "vertigo" attack associated with nausea and vomiting.  Patient states she has had these for years and they come on suddenly and resolve on their own. She continues to have occasional back and abdominal pain which is well controlled with Tylenol.  She has intermittent constipation.  She has no neurologic complaints.  She has a fair appetite and her weight has remained stable.  She denies any chest pain, shortness of breath, cough, or hemoptysis.  She does not complain of black tarry stools today.  She has no urinary complaints.  Patient offers no further specific complaints today.  REVIEW OF SYSTEMS:   Review of Systems  Constitutional: Positive for malaise/fatigue. Negative for fever and weight loss.  Respiratory: Negative.  Negative for cough and shortness of breath.   Cardiovascular: Negative.  Negative for chest pain and leg swelling.  Gastrointestinal: Positive for nausea and vomiting. Negative for abdominal pain, blood in stool, constipation, diarrhea, heartburn and melena.  Genitourinary: Negative.  Negative for dysuria.  Musculoskeletal: Positive for back pain. Negative for neck pain.  Skin: Negative.  Negative for itching and rash.  Neurological: Negative for dizziness, focal weakness and weakness.  Endo/Heme/Allergies: Does not bruise/bleed easily.  Psychiatric/Behavioral: Positive for  memory loss. The patient is not nervous/anxious.     As per HPI. Otherwise, a complete review of systems is negative.  PAST MEDICAL HISTORY: Past Medical History:  Diagnosis Date  . Anemia   . Arthritis   . Asthma   . Dyspnea   . GERD (gastroesophageal reflux disease)   . Hypertension   . Ovarian cancer ([place]) 2018    PAST SURGICAL HISTORY: Past Surgical History:  Procedure Laterality Date  . ABDOMINAL HYSTERECTOMY    . BREAST BIOPSY     x6  . BUNIONECTOMY Bilateral   . CATARACT EXTRACTION, BILATERAL    . FLEXIBLE SIGMOIDOSCOPY N/A 05/21/2017   Procedure: FLEXIBLE SIGMOIDOSCOPY;  Surgeon: Lin Landsman, MD;  Location: [facility] ENDOSCOPY;  Service: Gastroenterology;  Laterality: N/A;  . INCONTINENCE SURGERY    . PORTA CATH INSERTION N/A 06/16/2017   Procedure: PORTA CATH INSERTION;  Surgeon: Algernon Huxley, MD;  Location: [facility];  Service: Cardiovascular;  Laterality: N/A;  . RECTAL SURGERY  04/2018  . REPAIR OF RECTAL PROLAPSE N/A 05/11/2017   Procedure: REPAIR OF RECTAL PROLAPSE;  Surgeon: Leonie Green, MD;  Location: [facility] ORS;  Service: General;  Laterality: N/A;  . TEE WITHOUT CARDIOVERSION N/A 03/22/2020   Procedure: TRANSESOPHAGEAL ECHOCARDIOGRAM (TEE);  Surgeon: Nelva Bush, MD;  Location: [facility] ORS;  Service: Cardiovascular;  Laterality: N/A;  . TONSILLECTOMY    . VAGINA SURGERY     uncertain procedure performed    FAMILY HISTORY: Family History  Problem Relation Age of Onset  . Heart disease Mother   . Heart disease Father   . Heart disease Sister   . Heart attack Sister   . Ulcerative colitis Brother   . Lung cancer Brother   .  Thyroid cancer Sister   . Asthma Sister   . Diabetes Sister   . Asthma Sister   . Pancreatic cancer Sister   . Dementia Sister   . Asthma Brother   . Heart disease Brother   . Asthma Brother   . Lung cancer Brother   . Asthma Brother   . Lung cancer Brother   . Lung cancer Brother   . Rheum arthritis  Brother     ADVANCED DIRECTIVES (Y/N):  N  HEALTH MAINTENANCE: Social History   Tobacco Use  . Smoking status: Never Smoker  . Smokeless tobacco: Never Used  Vaping Use  . Vaping Use: Never used  Substance Use Topics  . Alcohol use: No  . Drug use: No     Colonoscopy:  PAP:  Bone density:  Lipid panel:  No Known Allergies  Current Outpatient Medications  Medication Sig Dispense Refill  . acetaminophen (TYLENOL) 500 MG tablet Take 500-1,000 mg by mouth every 6 (six) hours as needed for mild pain, moderate pain or fever.     . feeding supplement, ENSURE ENLIVE, (ENSURE ENLIVE) LIQD Take 237 mLs by mouth 2 (two) times daily between meals. 237 mL 12  . iron polysaccharides (NIFEREX) 150 MG capsule Take 1 capsule (150 mg total) by mouth daily. 90 capsule 1  . lidocaine-prilocaine (EMLA) cream APPLY A SMALL AMOUNT TO PORT SITE AT LEAST 1 HOUR PRIOR TO IT BEING ACCESSED THEN COVER WITH PLASTIC WRAP 30 g 0  . meclizine (ANTIVERT) 25 MG tablet Take 25 mg by mouth 3 (three) times daily as needed for dizziness.    . ondansetron (ZOFRAN) 4 MG tablet Take 1 tablet (4 mg total) by mouth every 6 (six) hours as needed for nausea. 20 tablet 0  . polyethylene glycol (MIRALAX / GLYCOLAX) packet Take 17 g by mouth daily as needed for mild constipation or moderate constipation.     . traMADol (ULTRAM) 50 MG tablet Take by mouth every 6 (six) hours as needed.    . cyclobenzaprine (FLEXERIL) 10 MG tablet TAKE ONE TABLET BY MOUTH THREE TIMES A DAY AS NEEDED (Patient not taking: No sig reported) 30 tablet 0  . magic mouthwash w/lidocaine SOLN Take 5 mLs by mouth 4 (four) times daily as needed for mouth pain. (Patient not taking: No sig reported) 240 mL 1  . montelukast (SINGULAIR) 10 MG tablet Take 10 mg by mouth at bedtime as needed (allergy symptoms).  (Patient not taking: No sig reported)    . valsartan (DIOVAN) 160 MG tablet Take 160 mg by mouth as needed. (Patient not taking: No sig reported)      No current facility-administered medications for this visit.   Facility-Administered Medications Ordered in Other Visits  Medication Dose Route Frequency Provider Last Rate Last Admin  . sodium chloride flush (NS) 0.9 % injection 10 mL  10 mL Intravenous PRN Lloyd Huger, MD   10 mL at 03/10/18 1200    OBJECTIVE: Vitals:   10/03/20 1052  BP: (!) 187/88  Pulse: 89  Resp: 18  Temp: (!) 96.3 F (35.7 C)     There is no height or weight on file to calculate BMI.    ECOG FS:0 - Asymptomatic  General: Well-developed, well-nourished, no acute distress. Eyes: Pink conjunctiva, anicteric sclera. HEENT: Normocephalic, moist mucous membranes. Lungs: No audible wheezing or coughing. Heart: Regular rate and rhythm. Abdomen: Soft, nontender, no obvious distention. Musculoskeletal: No edema, cyanosis, or clubbing. Neuro: Alert, answering all questions appropriately. Cranial  nerves grossly intact. Skin: No rashes or petechiae noted. Psych: Normal affect.   LAB RESULTS:  Lab Results  Component Value Date   NA 133 (L) 10/03/2020   K 3.5 10/03/2020   CL 97 (L) 10/03/2020   CO2 24 10/03/2020   GLUCOSE 119 (H) 10/03/2020   BUN 25 (H) 10/03/2020   CREATININE 0.69 10/03/2020   CALCIUM 8.4 (L) 10/03/2020   PROT 6.2 (L) 10/03/2020   ALBUMIN 3.1 (L) 10/03/2020   AST 34 10/03/2020   ALT 17 10/03/2020   ALKPHOS 71 10/03/2020   BILITOT 0.5 10/03/2020   GFRNONAA >60 10/03/2020   GFRAA >60 05/16/2020    Lab Results  Component Value Date   WBC 8.4 10/03/2020   NEUTROABS 7.1 10/03/2020   HGB 8.4 (L) 10/03/2020   HCT 25.9 (L) 10/03/2020   MCV 89.9 10/03/2020   PLT 521 (H) 10/03/2020     STUDIES: No results found.  ASSESSMENT: Stage IIIC ovarian cancer.  PLAN:    1. Stage IIIC ovarian cancer: Patient is now considered platinum refractory and is receiving single agent gemcitabine. Plan to give treatment on days 1, 8, and 15 with a 22 off. CT scan results from May 06, 2020 reviewed independently with progression of disease.  Since taking time off, patient's CA-125 has trended up to 539.0, but has declined to 416.  Patient stated she is "tired" and may want to discontinue treatment in the near future.  Delay cycle 11, day 8 of treatment today secondary to vertigo and nausea/vomiting.  Return to clinic in 1 week for further evaluation and reconsideration of treatment. 2.  Liver abscess: Improved per CT scan.  Appreciate infectious disease input.  Patient has now completed IV antibiotics.  Proceed with treatment as above.   3. Lupus anticoagulant: Patient was noted to have an elevated PTT as well as increased bleeding during her surgery for rectal prolapse.   4. Anemia: Chronic and unchanged.  Multifactorial including heme positive stools secondary to GI bleed as well as chemotherapy.  Given patient's age and comorbidities, will not refer for EGD or colonoscopy at this time, but if there is a significant bleed or drop in hemoglobin may reconsider.  Patient's hemoglobin is 8.4 today. 5.  Constipation: Intermittent.  Continue OTC remedies as needed. 6.  Hyponatremia: Chronic and unchanged. 7.  Back/abdominal pain: Chronic and unchanged.  Patient states tramadol is "too strong", but agreed to take it if her symptoms get worse.  Continue Tylenol as needed.  Imaging as above. 8.  Blood in stool: Patient has a history of hemorrhoids as well as rectal prolapse.  Hemoccult cards are positive, but repeat testing was negative.  Continue to monitor closely. 9.  Weakness and fatigue: Chronic and unchanged.   10. Memory: Chronic and unchanged. 11. Reflux: Continue Nexium as needed. 12.  Leukocytosis: Resolved. 13.  Vertigo, nausea/vomiting: Delay treatment as above.  Patient will instead receive 1 L of IV fluids along with IV dexamethasone and ondansetron.  Patient expressed understanding and was in agreement with this plan. She also understands that She can call clinic at any time  with any questions, concerns, or complaints.   Cancer Staging Malignant neoplasm of ovary [facility]) Staging form: Ovary, Fallopian Tube, and Primary Peritoneal Carcinoma, AJCC 8th Edition - Clinical stage from 05/29/2017: Stage IIIC (cT3c, cN1b, cM0) - Signed by Lloyd Huger, MD on 05/29/2017   Lloyd Huger, MD   10/04/2020 6:39 AM

## 2020-10-03 ENCOUNTER — Inpatient Hospital Stay: Payer: Medicare PPO

## 2020-10-03 ENCOUNTER — Encounter: Payer: Self-pay | Admitting: Oncology

## 2020-10-03 ENCOUNTER — Inpatient Hospital Stay (HOSPITAL_BASED_OUTPATIENT_CLINIC_OR_DEPARTMENT_OTHER): Payer: Medicare PPO | Admitting: Oncology

## 2020-10-03 VITALS — BP 187/88 | HR 89 | Temp 96.3°F | Resp 18

## 2020-10-03 DIAGNOSIS — C569 Malignant neoplasm of unspecified ovary: Secondary | ICD-10-CM

## 2020-10-03 DIAGNOSIS — Z5111 Encounter for antineoplastic chemotherapy: Secondary | ICD-10-CM | POA: Diagnosis not present

## 2020-10-03 LAB — COMPREHENSIVE METABOLIC PANEL
ALT: 17 U/L (ref 0–44)
AST: 34 U/L (ref 15–41)
Albumin: 3.1 g/dL — ABNORMAL LOW (ref 3.5–5.0)
Alkaline Phosphatase: 71 U/L (ref 38–126)
Anion gap: 12 (ref 5–15)
BUN: 25 mg/dL — ABNORMAL HIGH (ref 8–23)
CO2: 24 mmol/L (ref 22–32)
Calcium: 8.4 mg/dL — ABNORMAL LOW (ref 8.9–10.3)
Chloride: 97 mmol/L — ABNORMAL LOW (ref 98–111)
Creatinine, Ser: 0.69 mg/dL (ref 0.44–1.00)
GFR, Estimated: >60 mL/min (ref >60)
Glucose, Bld: 119 mg/dL — ABNORMAL HIGH (ref 70–99)
Potassium: 3.5 mmol/L (ref 3.5–5.1)
Sodium: 133 mmol/L — ABNORMAL LOW (ref 135–145)
Total Bilirubin: 0.5 mg/dL (ref 0.3–1.2)
Total Protein: 6.2 g/dL — ABNORMAL LOW (ref 6.5–8.1)

## 2020-10-03 LAB — CBC WITH DIFFERENTIAL/PLATELET
Abs Immature Granulocytes: 0.06 10*3/uL (ref 0.00–0.07)
Basophils Absolute: 0 10*3/uL (ref 0.0–0.1)
Basophils Relative: 0 %
Eosinophils Absolute: 0 10*3/uL (ref 0.0–0.5)
Eosinophils Relative: 0 %
HCT: 25.9 % — ABNORMAL LOW (ref 36.0–46.0)
Hemoglobin: 8.4 g/dL — ABNORMAL LOW (ref 12.0–15.0)
Immature Granulocytes: 1 %
Lymphocytes Relative: 7 %
Lymphs Abs: 0.6 10*3/uL — ABNORMAL LOW (ref 0.7–4.0)
MCH: 29.2 pg (ref 26.0–34.0)
MCHC: 32.4 g/dL (ref 30.0–36.0)
MCV: 89.9 fL (ref 80.0–100.0)
Monocytes Absolute: 0.5 10*3/uL (ref 0.1–1.0)
Monocytes Relative: 7 %
Neutro Abs: 7.1 10*3/uL (ref 1.7–7.7)
Neutrophils Relative %: 85 %
Platelets: 521 10*3/uL — ABNORMAL HIGH (ref 150–400)
RBC: 2.88 MIL/uL — ABNORMAL LOW (ref 3.87–5.11)
RDW: 16.8 % — ABNORMAL HIGH (ref 11.5–15.5)
WBC: 8.4 10*3/uL (ref 4.0–10.5)
nRBC: 0 % (ref 0.0–0.2)

## 2020-10-03 MED ORDER — HEPARIN SOD (PORK) LOCK FLUSH 100 UNIT/ML IV SOLN
INTRAVENOUS | Status: AC
  Filled 2020-10-03: qty 5

## 2020-10-03 MED ORDER — SODIUM CHLORIDE 0.9 % IV SOLN: Freq: Once | INTRAVENOUS | Status: DC

## 2020-10-03 MED ORDER — SODIUM CHLORIDE 0.9 % IV SOLN
Freq: Once | INTRAVENOUS | Status: AC
  Filled 2020-10-03: qty 250

## 2020-10-03 MED ORDER — HEPARIN SOD (PORK) LOCK FLUSH 100 UNIT/ML IV SOLN
500.0000 [IU] | Freq: Once | INTRAVENOUS | Status: AC
  Administered 2020-10-03: 500 [IU] via INTRAVENOUS
  Filled 2020-10-03: qty 5

## 2020-10-03 MED ORDER — ONDANSETRON HCL 4 MG/2ML IJ SOLN
8.0000 mg | Freq: Once | INTRAMUSCULAR | Status: AC
  Administered 2020-10-03: 8 mg via INTRAVENOUS
  Filled 2020-10-03: qty 4

## 2020-10-03 MED ORDER — DEXAMETHASONE SODIUM PHOSPHATE 10 MG/ML IJ SOLN
10.0000 mg | Freq: Once | INTRAMUSCULAR | Status: AC
  Administered 2020-10-03: 10 mg via INTRAVENOUS
  Filled 2020-10-03: qty 1

## 2020-10-03 MED ORDER — SODIUM CHLORIDE 0.9% FLUSH
10.0000 mL | Freq: Once | INTRAVENOUS | Status: AC
  Administered 2020-10-03: 10 mL via INTRAVENOUS
  Filled 2020-10-03: qty 10

## 2020-10-03 NOTE — Progress Notes (Signed)
Pt and son in for follow up, pt have a vertigo episode and having nausea and vomiting this morning.

## 2020-10-03 NOTE — Progress Notes (Signed)
Blood Pressure elevated during MD visit today, 187/88. 1105- Rechecked Blood Pressure: 162/83. MD, Dr. Grayland Ormond, notified and aware. Per MD secure chat message: no new orders.

## 2020-10-04 LAB — CA 125: Cancer Antigen (CA) 125: 392 U/mL — ABNORMAL HIGH (ref 0.0–38.1)

## 2020-10-06 NOTE — Progress Notes (Signed)
[place]  Telephone:(336) [phone] Fax:([phone]  ID: Tamara Mckinney OB: Jan 14, 1928  MR#: [id number]  JGG#:[id number]  Patient Care Team: [name], MD as PCP - General (Family Medicine) [name], RN as Registered Nurse Herbert Pun, MD as Consulting Physician (General Surgery) Lloyd Huger, MD as Consulting Physician (Oncology)  CHIEF COMPLAINT: Stage IIIC ovarian cancer.  INTERVAL HISTORY: Patient returns to clinic today for further evaluation and reconsideration of cycle 11, day 8 of single agent gemcitabine.  Her vertigo has resolved and she is back to her baseline.  She continues to complain of a poor memory.  She continues to have occasional back and abdominal pain which is well controlled with Tylenol.  She has intermittent constipation.  She has no neurologic complaints.  She has a fair appetite and her weight has remained stable.  She denies any chest pain, shortness of breath, cough, or hemoptysis.  She does not complain of black tarry stools today.  She has no urinary complaints.  Patient offers no further specific complaints today.  REVIEW OF SYSTEMS:   Review of Systems  Constitutional: Negative.  Negative for fever, malaise/fatigue and weight loss.  Respiratory: Negative.  Negative for cough and shortness of breath.   Cardiovascular: Negative.  Negative for chest pain and leg swelling.  Gastrointestinal: Negative.  Negative for abdominal pain, blood in stool, constipation, diarrhea, heartburn, melena, nausea and vomiting.  Genitourinary: Negative.  Negative for dysuria.  Musculoskeletal: Positive for back pain. Negative for neck pain.  Skin: Negative.  Negative for itching and rash.  Neurological: Negative for dizziness, focal weakness and weakness.  Endo/Heme/Allergies: Does not bruise/bleed easily.  Psychiatric/Behavioral: Positive for memory loss. The patient is not nervous/anxious.     As per HPI. Otherwise, a  complete review of systems is negative.  PAST MEDICAL HISTORY: Past Medical History:  Diagnosis Date  . Anemia   . Arthritis   . Asthma   . Dyspnea   . GERD (gastroesophageal reflux disease)   . Hypertension   . Ovarian cancer ([place]) 2018    PAST SURGICAL HISTORY: Past Surgical History:  Procedure Laterality Date  . ABDOMINAL HYSTERECTOMY    . BREAST BIOPSY     x6  . BUNIONECTOMY Bilateral   . CATARACT EXTRACTION, BILATERAL    . FLEXIBLE SIGMOIDOSCOPY N/A 05/21/2017   Procedure: FLEXIBLE SIGMOIDOSCOPY;  Surgeon: Lin Landsman, MD;  Location: [facility] ENDOSCOPY;  Service: Gastroenterology;  Laterality: N/A;  . INCONTINENCE SURGERY    . PORTA CATH INSERTION N/A 06/16/2017   Procedure: PORTA CATH INSERTION;  Surgeon: Algernon Huxley, MD;  Location: [place] [facility] LAB;  Service: Cardiovascular;  Laterality: N/A;  . RECTAL SURGERY  04/2018  . REPAIR OF RECTAL PROLAPSE N/A 05/11/2017   Procedure: REPAIR OF RECTAL PROLAPSE;  Surgeon: Leonie Green, MD;  Location: [facility] ORS;  Service: General;  Laterality: N/A;  . TEE WITHOUT CARDIOVERSION N/A 03/22/2020   Procedure: TRANSESOPHAGEAL ECHOCARDIOGRAM (TEE);  Surgeon: Nelva Bush, MD;  Location: [facility] ORS;  Service: Cardiovascular;  Laterality: N/A;  . TONSILLECTOMY    . VAGINA SURGERY     uncertain procedure performed    FAMILY HISTORY: Family History  Problem Relation Age of Onset  . Heart disease Mother   . Heart disease Father   . Heart disease Sister   . Heart attack Sister   . Ulcerative colitis Brother   . Lung cancer Brother   . Thyroid cancer Sister   . Asthma Sister   .  Diabetes Sister   . Asthma Sister   . Pancreatic cancer Sister   . Dementia Sister   . Asthma Brother   . Heart disease Brother   . Asthma Brother   . Lung cancer Brother   . Asthma Brother   . Lung cancer Brother   . Lung cancer Brother   . Rheum arthritis Brother     ADVANCED DIRECTIVES (Y/N):  N  HEALTH MAINTENANCE: Social History    Tobacco Use  . Smoking status: Never Smoker  . Smokeless tobacco: Never Used  Vaping Use  . Vaping Use: Never used  Substance Use Topics  . Alcohol use: No  . Drug use: No     Colonoscopy:  PAP:  Bone density:  Lipid panel:  No Known Allergies  Current Outpatient Medications  Medication Sig Dispense Refill  . acetaminophen (TYLENOL) 500 MG tablet Take 500-1,000 mg by mouth every 6 (six) hours as needed for mild pain, moderate pain or fever.     . feeding supplement, ENSURE ENLIVE, (ENSURE ENLIVE) LIQD Take 237 mLs by mouth 2 (two) times daily between meals. 237 mL 12  . iron polysaccharides (NIFEREX) 150 MG capsule Take 1 capsule (150 mg total) by mouth daily. 90 capsule 1  . lidocaine-prilocaine (EMLA) cream APPLY A SMALL AMOUNT TO PORT SITE AT LEAST 1 HOUR PRIOR TO IT BEING ACCESSED THEN COVER WITH PLASTIC WRAP 30 g 0  . meclizine (ANTIVERT) 25 MG tablet Take 25 mg by mouth 3 (three) times daily as needed for dizziness.    . ondansetron (ZOFRAN) 4 MG tablet Take 1 tablet (4 mg total) by mouth every 6 (six) hours as needed for nausea. 20 tablet 0  . polyethylene glycol (MIRALAX / GLYCOLAX) packet Take 17 g by mouth daily as needed for mild constipation or moderate constipation.     . traMADol (ULTRAM) 50 MG tablet TAKE ONE TABLET BY MOUTH EVERY 6 HOURS AS NEEDED 30 tablet 0  . cyclobenzaprine (FLEXERIL) 10 MG tablet TAKE ONE TABLET BY MOUTH THREE TIMES A DAY AS NEEDED (Patient not taking: No sig reported) 30 tablet 0  . magic mouthwash w/lidocaine SOLN Take 5 mLs by mouth 4 (four) times daily as needed for mouth pain. (Patient not taking: No sig reported) 240 mL 1  . montelukast (SINGULAIR) 10 MG tablet Take 10 mg by mouth at bedtime as needed (allergy symptoms).  (Patient not taking: No sig reported)    . valsartan (DIOVAN) 160 MG tablet Take 160 mg by mouth as needed. (Patient not taking: No sig reported)     No current facility-administered medications for this visit.    Facility-Administered Medications Ordered in Other Visits  Medication Dose Route Frequency Provider Last Rate Last Admin  . sodium chloride flush (NS) 0.9 % injection 10 mL  10 mL Intravenous PRN Lloyd Huger, MD   10 mL at 03/10/18 1200    OBJECTIVE: Vitals:   10/10/20 1109  BP: (!) 156/90  Pulse: 87  Resp: 18  Temp: (!) 97.5 F (36.4 C)  SpO2: 100%     Body mass index is 17.01 kg/m.    ECOG FS:0 - Asymptomatic  General: Thin, no acute distress. Eyes: Pink conjunctiva, anicteric sclera. HEENT: Normocephalic, moist mucous membranes. Lungs: No audible wheezing or coughing. Heart: Regular rate and rhythm. Abdomen: Soft, nontender, no obvious distention. Musculoskeletal: No edema, cyanosis, or clubbing. Neuro: Alert, answering all questions appropriately. Cranial nerves grossly intact. Skin: No rashes or petechiae noted. Psych: Normal affect.  LAB RESULTS:  Lab Results  Component Value Date   NA 135 10/10/2020   K 4.1 10/10/2020   CL 100 10/10/2020   CO2 25 10/10/2020   GLUCOSE 88 10/10/2020   BUN 24 (H) 10/10/2020   CREATININE 0.89 10/10/2020   CALCIUM 8.3 (L) 10/10/2020   PROT 6.0 (L) 10/10/2020   ALBUMIN 3.0 (L) 10/10/2020   AST 40 10/10/2020   ALT 18 10/10/2020   ALKPHOS 67 10/10/2020   BILITOT 0.6 10/10/2020   GFRNONAA >60 10/10/2020   GFRAA >60 05/16/2020    Lab Results  Component Value Date   WBC 12.9 (H) 10/10/2020   NEUTROABS 10.7 (H) 10/10/2020   HGB 9.0 (L) 10/10/2020   HCT 28.8 (L) 10/10/2020   MCV 91.4 10/10/2020   PLT 327 10/10/2020     STUDIES: No results found.  ASSESSMENT: Stage IIIC ovarian cancer.  PLAN:    1. Stage IIIC ovarian cancer: Patient is now considered platinum refractory and is receiving single agent gemcitabine. Plan to give treatment on days 1, 8, and 15 with a 22 off. CT scan results from May 06, 2020 reviewed independently with progression of disease.  Since taking time off, patient's CA-125 has  trended up to 539.0, but has declined to 392.0.  Patient stated she is "tired" and may want to discontinue treatment in the near future.  Patient vertigo has resolved so we will proceed with cycle 11, day 8 of treatment today. Return to clinic in 1 week for further evaluation and consideration of cycle 11, day 15.  2.  Liver abscess: Improved per CT scan.  Appreciate infectious disease input.  Patient has now completed IV antibiotics.  Proceed with treatment as above.   3. Lupus anticoagulant: Patient was noted to have an elevated PTT as well as increased bleeding during her surgery for rectal prolapse.   4. Anemia: Slightly improved.  Multifactorial including heme positive stools secondary to GI bleed as well as chemotherapy.  Given patient's age and comorbidities, will not refer for EGD or colonoscopy at this time, but if there is a significant bleed or drop in hemoglobin may reconsider.  Patient's hemoglobin is 9.0 today. 5.  Constipation: Intermittent.  Continue OTC remedies as needed. 6.  Hyponatremia: Resolved. 7.  Back/abdominal pain: Chronic and unchanged.  Patient states tramadol is "too strong", but agreed to take it if her symptoms get worse.  Continue Tylenol as needed.  Imaging as above. 8.  Blood in stool: Patient has a history of hemorrhoids as well as rectal prolapse.  Hemoccult cards are positive, but repeat testing was negative.  Continue to monitor closely. 9.  Weakness and fatigue: Chronic and unchanged. 10. Memory: Chronic and unchanged. 11. Reflux: Continue Nexium as needed. 12.  Leukocytosis: Mild, monitor. 13.  Vertigo, nausea/vomiting: Resolved.  Patient expressed understanding and was in agreement with this plan. She also understands that She can call clinic at any time with any questions, concerns, or complaints.   Cancer Staging Malignant neoplasm of ovary [facility]) Staging form: Ovary, Fallopian Tube, and Primary Peritoneal Carcinoma, AJCC 8th Edition - Clinical stage from  05/29/2017: Stage IIIC (cT3c, cN1b, cM0) - Signed by Lloyd Huger, MD on 05/29/2017   Lloyd Huger, MD   10/11/2020 6:11 AM

## 2020-10-08 ENCOUNTER — Other Ambulatory Visit: Payer: Self-pay | Admitting: Oncology

## 2020-10-10 ENCOUNTER — Inpatient Hospital Stay: Payer: Medicare PPO

## 2020-10-10 ENCOUNTER — Encounter: Payer: Self-pay | Admitting: Oncology

## 2020-10-10 ENCOUNTER — Inpatient Hospital Stay (HOSPITAL_BASED_OUTPATIENT_CLINIC_OR_DEPARTMENT_OTHER): Payer: Medicare PPO | Admitting: Oncology

## 2020-10-10 VITALS — BP 156/90 | HR 87 | Temp 97.5°F | Resp 18 | Wt 90.0 lb

## 2020-10-10 DIAGNOSIS — C569 Malignant neoplasm of unspecified ovary: Secondary | ICD-10-CM

## 2020-10-10 DIAGNOSIS — Z5111 Encounter for antineoplastic chemotherapy: Secondary | ICD-10-CM | POA: Diagnosis not present

## 2020-10-10 LAB — CBC WITH DIFFERENTIAL/PLATELET
Abs Immature Granulocytes: 0.08 10*3/uL — ABNORMAL HIGH (ref 0.00–0.07)
Basophils Absolute: 0 10*3/uL (ref 0.0–0.1)
Basophils Relative: 0 %
Eosinophils Absolute: 0 10*3/uL (ref 0.0–0.5)
Eosinophils Relative: 0 %
HCT: 28.8 % — ABNORMAL LOW (ref 36.0–46.0)
Hemoglobin: 9 g/dL — ABNORMAL LOW (ref 12.0–15.0)
Immature Granulocytes: 1 %
Lymphocytes Relative: 6 %
Lymphs Abs: 0.8 10*3/uL (ref 0.7–4.0)
MCH: 28.6 pg (ref 26.0–34.0)
MCHC: 31.3 g/dL (ref 30.0–36.0)
MCV: 91.4 fL (ref 80.0–100.0)
Monocytes Absolute: 1.3 10*3/uL — ABNORMAL HIGH (ref 0.1–1.0)
Monocytes Relative: 10 %
Neutro Abs: 10.7 10*3/uL — ABNORMAL HIGH (ref 1.7–7.7)
Neutrophils Relative %: 83 %
Platelets: 327 10*3/uL (ref 150–400)
RBC: 3.15 MIL/uL — ABNORMAL LOW (ref 3.87–5.11)
RDW: 17.5 % — ABNORMAL HIGH (ref 11.5–15.5)
WBC: 12.9 10*3/uL — ABNORMAL HIGH (ref 4.0–10.5)
nRBC: 0 % (ref 0.0–0.2)

## 2020-10-10 LAB — COMPREHENSIVE METABOLIC PANEL
ALT: 18 U/L (ref 0–44)
AST: 40 U/L (ref 15–41)
Albumin: 3 g/dL — ABNORMAL LOW (ref 3.5–5.0)
Alkaline Phosphatase: 67 U/L (ref 38–126)
Anion gap: 10 (ref 5–15)
BUN: 24 mg/dL — ABNORMAL HIGH (ref 8–23)
CO2: 25 mmol/L (ref 22–32)
Calcium: 8.3 mg/dL — ABNORMAL LOW (ref 8.9–10.3)
Chloride: 100 mmol/L (ref 98–111)
Creatinine, Ser: 0.89 mg/dL (ref 0.44–1.00)
GFR, Estimated: >60 mL/min (ref >60)
Glucose, Bld: 88 mg/dL (ref 70–99)
Potassium: 4.1 mmol/L (ref 3.5–5.1)
Sodium: 135 mmol/L (ref 135–145)
Total Bilirubin: 0.6 mg/dL (ref 0.3–1.2)
Total Protein: 6 g/dL — ABNORMAL LOW (ref 6.5–8.1)

## 2020-10-10 MED ORDER — PROCHLORPERAZINE MALEATE 10 MG PO TABS
10.0000 mg | ORAL_TABLET | Freq: Once | ORAL | Status: AC
  Administered 2020-10-10: 10 mg via ORAL
  Filled 2020-10-10: qty 1

## 2020-10-10 MED ORDER — SODIUM CHLORIDE 0.9 % IV SOLN
Freq: Once | INTRAVENOUS | Status: AC
  Filled 2020-10-10: qty 250

## 2020-10-10 MED ORDER — SODIUM CHLORIDE 0.9 % IV SOLN
1000.0000 mg | Freq: Once | INTRAVENOUS | Status: AC
Start: 2020-10-10 — End: 2020-10-10
  Administered 2020-10-10: 988.5932 mg via INTRAVENOUS
  Filled 2020-10-10: qty 26

## 2020-10-10 MED ORDER — HEPARIN SOD (PORK) LOCK FLUSH 100 UNIT/ML IV SOLN
500.0000 [IU] | Freq: Once | INTRAVENOUS | Status: AC | PRN
  Administered 2020-10-10: 500 [IU]
  Filled 2020-10-10: qty 5

## 2020-10-10 MED ORDER — HEPARIN SOD (PORK) LOCK FLUSH 100 UNIT/ML IV SOLN
INTRAVENOUS | Status: AC
  Filled 2020-10-10: qty 5

## 2020-10-10 NOTE — Progress Notes (Signed)
Pt in for follow up, reports doing much better than last week. No dizziness today, weight is down 4 lbs.

## 2020-10-10 NOTE — Progress Notes (Signed)
Pt received gemzar infusion in clinic today. Tolerated well. No complaints @ d/c.

## 2020-10-11 LAB — CA 125: Cancer Antigen (CA) 125: 448 U/mL — ABNORMAL HIGH (ref 0.0–38.1)

## 2020-10-12 NOTE — Progress Notes (Signed)
[place]  Telephone:(336) [phone] [phone]) [phone]  ID: Tamara Mckinney OB: 1928-05-31  MR#: [id number]  PXT#:[id number]  Patient Care Team: [name], MD as PCP - General (Family Medicine) [name], RN as Registered Nurse Herbert Pun, MD as Consulting Physician (General Surgery) Lloyd Huger, MD as Consulting Physician (Oncology)  CHIEF COMPLAINT: Stage IIIC ovarian cancer.  INTERVAL HISTORY: Patient returns to clinic today for further evaluation and consideration of cycle 11, day 15 of single agent gemcitabine.  She is tolerating her treatments well without significant side effects.  She has chronic weakness and fatigue and continues to complain of a poor memory.  She continues to have occasional back and abdominal pain which is well controlled with Tylenol.  She has intermittent constipation.  She has no neurologic complaints.  She has a fair appetite and her weight has remained stable.  She denies any chest pain, shortness of breath, cough, or hemoptysis.  She does not complain of black tarry stools today.  She has no urinary complaints.  Patient offers no further specific complaints today.    REVIEW OF SYSTEMS:   Review of Systems  Constitutional: Positive for malaise/fatigue. Negative for fever and weight loss.  Respiratory: Negative.  Negative for cough and shortness of breath.   Cardiovascular: Negative.  Negative for chest pain and leg swelling.  Gastrointestinal: Negative.  Negative for abdominal pain, blood in stool, constipation, diarrhea, heartburn, melena, nausea and vomiting.  Genitourinary: Negative.  Negative for dysuria.  Musculoskeletal: Positive for back pain. Negative for neck pain.  Skin: Negative.  Negative for itching and rash.  Neurological: Positive for weakness. Negative for dizziness and focal weakness.  Endo/Heme/Allergies: Does not bruise/bleed easily.  Psychiatric/Behavioral: Positive for memory loss.  The patient is not nervous/anxious.     As per HPI. Otherwise, a complete review of systems is negative.  PAST MEDICAL HISTORY: Past Medical History:  Diagnosis Date  . Anemia   . Arthritis   . Asthma   . Dyspnea   . GERD (gastroesophageal reflux disease)   . Hypertension   . Ovarian cancer ([place]) 2018    PAST SURGICAL HISTORY: Past Surgical History:  Procedure Laterality Date  . ABDOMINAL HYSTERECTOMY    . BREAST BIOPSY     x6  . BUNIONECTOMY Bilateral   . CATARACT EXTRACTION, BILATERAL    . FLEXIBLE SIGMOIDOSCOPY N/A 05/21/2017   Procedure: FLEXIBLE SIGMOIDOSCOPY;  Surgeon: Lin Landsman, MD;  Location[facility] ENDOSCOPY;  Service: Gastroenterology;  Laterality: N/A;  . INCONTINENCE SURGERY    . PORTA CATH INSERTION N/A 06/16/2017   Procedure: PORTA CATH INSERTION;  Surgeon: Algernon Huxley, MD;  Location: [place] CV LAB;  Service: Cardiovascular;  Laterality: N/A;  . RECTAL SURGERY  04/2018  . REPAIR OF RECTAL PROLAPSE N/A 05/11/2017   Procedure: REPAIR OF RECTAL PROLAPSE;  Surgeon: Leonie Green, MD;  Location: [facility] ORS;  Service: General;  Laterality: N/A;  . TEE WITHOUT CARDIOVERSION N/A 03/22/2020   Procedure: TRANSESOPHAGEAL ECHOCARDIOGRAM (TEE);  Surgeon: Nelva Bush, MD;  Location: [facility] ORS;  Service: Cardiovascular;  Laterality: N/A;  . TONSILLECTOMY    . VAGINA SURGERY     uncertain procedure performed    FAMILY HISTORY: Family History  Problem Relation Age of Onset  . Heart disease Mother   . Heart disease Father   . Heart disease Sister   . Heart attack Sister   . Ulcerative colitis Brother   . Lung cancer Brother   . Thyroid  cancer Sister   . Asthma Sister   . Diabetes Sister   . Asthma Sister   . Pancreatic cancer Sister   . Dementia Sister   . Asthma Brother   . Heart disease Brother   . Asthma Brother   . Lung cancer Brother   . Asthma Brother   . Lung cancer Brother   . Lung cancer Brother   . Rheum arthritis Brother      ADVANCED DIRECTIVES (Y/N):  N  HEALTH MAINTENANCE: Social History   Tobacco Use  . Smoking status: Never Smoker  . Smokeless tobacco: Never Used  Vaping Use  . Vaping Use: Never used  Substance Use Topics  . Alcohol use: No  . Drug use: No     Colonoscopy:  PAP:  Bone density:  Lipid panel:  No Known Allergies  Current Outpatient Medications  Medication Sig Dispense Refill  . acetaminophen (TYLENOL) 500 MG tablet Take 500-1,000 mg by mouth every 6 (six) hours as needed for mild pain, moderate pain or fever.     . cyclobenzaprine (FLEXERIL) 10 MG tablet TAKE ONE TABLET BY MOUTH THREE TIMES A DAY AS NEEDED 30 tablet 0  . feeding supplement, ENSURE ENLIVE, (ENSURE ENLIVE) LIQD Take 237 mLs by mouth 2 (two) times daily between meals. 237 mL 12  . iron polysaccharides (NIFEREX) 150 MG capsule Take 1 capsule (150 mg total) by mouth daily. 90 capsule 1  . lidocaine-prilocaine (EMLA) cream APPLY A SMALL AMOUNT TO PORT SITE AT LEAST 1 HOUR PRIOR TO IT BEING ACCESSED THEN COVER WITH PLASTIC WRAP 30 g 0  . meclizine (ANTIVERT) 25 MG tablet Take 25 mg by mouth 3 (three) times daily as needed for dizziness.    . polyethylene glycol (MIRALAX / GLYCOLAX) packet Take 17 g by mouth daily as needed for mild constipation or moderate constipation.     . traMADol (ULTRAM) 50 MG tablet TAKE ONE TABLET BY MOUTH EVERY 6 HOURS AS NEEDED 30 tablet 0  . magic mouthwash w/lidocaine SOLN Take 5 mLs by mouth 4 (four) times daily as needed for mouth pain. (Patient not taking: No sig reported) 240 mL 1  . montelukast (SINGULAIR) 10 MG tablet Take 10 mg by mouth at bedtime as needed (allergy symptoms).  (Patient not taking: No sig reported)    . ondansetron (ZOFRAN) 4 MG tablet Take 1 tablet (4 mg total) by mouth every 6 (six) hours as needed for nausea. (Patient not taking: Reported on 10/17/2020) 20 tablet 0  . valsartan (DIOVAN) 160 MG tablet Take 160 mg by mouth as needed. (Patient not taking: No sig  reported)     No current facility-administered medications for this visit.   Facility-Administered Medications Ordered in Other Visits  Medication Dose Route Frequency Provider Last Rate Last Admin  . sodium chloride flush (NS) 0.9 % injection 10 mL  10 mL Intravenous PRN Lloyd Huger, MD   10 mL at 03/10/18 1200    OBJECTIVE: Vitals:   10/17/20 1013  BP: 139/74  Pulse: 98  Resp: 16  Temp: 99.5 F (37.5 C)  SpO2: 100%     Body mass index is 17.48 kg/m.    ECOG FS:0 - Asymptomatic  General: Thin, no acute distress. Eyes: Pink conjunctiva, anicteric sclera. HEENT: Normocephalic, moist mucous membranes. Lungs: No audible wheezing or coughing. Heart: Regular rate and rhythm. Abdomen: Soft, nontender, no obvious distention. Musculoskeletal: No edema, cyanosis, or clubbing. Neuro: Alert, answering all questions appropriately. Cranial nerves grossly intact. Skin:  No rashes or petechiae noted. Psych: Normal affect.   LAB RESULTS:  Lab Results  Component Value Date   NA 133 (L) 10/17/2020   K 4.4 10/17/2020   CL 98 10/17/2020   CO2 24 10/17/2020   GLUCOSE 98 10/17/2020   BUN 28 (H) 10/17/2020   CREATININE 0.79 10/17/2020   CALCIUM 8.5 (L) 10/17/2020   PROT 5.9 (L) 10/17/2020   ALBUMIN 2.9 (L) 10/17/2020   AST 36 10/17/2020   ALT 15 10/17/2020   ALKPHOS 65 10/17/2020   BILITOT 0.5 10/17/2020   GFRNONAA >60 10/17/2020   GFRAA >60 05/16/2020    Lab Results  Component Value Date   WBC 7.3 10/17/2020   NEUTROABS 6.1 10/17/2020   HGB 8.4 (L) 10/17/2020   HCT 26.9 (L) 10/17/2020   MCV 90.9 10/17/2020   PLT 307 10/17/2020     STUDIES: No results found.  ASSESSMENT: Stage IIIC ovarian cancer.  PLAN:    1. Stage IIIC ovarian cancer: Patient is now considered platinum refractory and is receiving single agent gemcitabine. Plan to give treatment on days 1, 8, and 15 with a 22 off. CT scan results from May 06, 2020 reviewed independently with progression  of disease.  Since taking time off, patient's CA-125 has trended up to 539.0, but has declined to 392.0 and now seems to have plateaued around 400.Marland Kitchen  Patient stated she is "tired" and may want to discontinue treatment in the near future.  Proceed with cycle 11, day 15 of treatment today.  Return to clinic in 2 weeks for further evaluation and consideration of cycle 12, day 1.  2.  Liver abscess: Improved per CT scan.  Appreciate infectious disease input.  Patient has now completed IV antibiotics.  Proceed with treatment as above.   3. Lupus anticoagulant: Patient was noted to have an elevated PTT as well as increased bleeding during her surgery for rectal prolapse.   4. Anemia: Chronic and unchanged.  Multifactorial including heme positive stools secondary to GI bleed as well as chemotherapy.  Given patient's age and comorbidities, will not refer for EGD or colonoscopy at this time, but if there is a significant bleed or drop in hemoglobin may reconsider.  Patient's hemoglobin is 8.4 today. 5.  Constipation: Intermittent.  Continue OTC remedies as needed. 6.  Hyponatremia: Mild, monitor. 7.  Back/abdominal pain: Chronic and unchanged.  Patient states tramadol is "too strong", but agreed to take it if her symptoms get worse.  Continue Tylenol as needed.  Imaging as above. 8.  Blood in stool: Patient has a history of hemorrhoids as well as rectal prolapse.  Hemoccult cards are positive, but repeat testing was negative.  Continue to monitor closely. 9.  Weakness and fatigue: Chronic and unchanged. 10. Memory: Chronic and unchanged. 11. Reflux: Continue Nexium as needed. 12.  Vertigo, nausea/vomiting: Resolved.  Patient expressed understanding and was in agreement with this plan. She also understands that She can call clinic at any time with any questions, concerns, or complaints.   Cancer Staging Malignant neoplasm of ovary [facility]) Staging form: Ovary, Fallopian Tube, and Primary Peritoneal Carcinoma,  AJCC 8th Edition - Clinical stage from 05/29/2017: Stage IIIC (cT3c, cN1b, cM0) - Signed by Lloyd Huger, MD on 05/29/2017   Lloyd Huger, MD   10/19/2020 6:55 AM

## 2020-10-17 ENCOUNTER — Inpatient Hospital Stay: Payer: Medicare PPO | Attending: Oncology

## 2020-10-17 ENCOUNTER — Inpatient Hospital Stay: Payer: Medicare PPO

## 2020-10-17 ENCOUNTER — Inpatient Hospital Stay (HOSPITAL_BASED_OUTPATIENT_CLINIC_OR_DEPARTMENT_OTHER): Payer: Medicare PPO | Admitting: Oncology

## 2020-10-17 ENCOUNTER — Other Ambulatory Visit: Payer: Self-pay

## 2020-10-17 ENCOUNTER — Encounter: Payer: Self-pay | Admitting: Oncology

## 2020-10-17 VITALS — BP 139/74 | HR 98 | Temp 99.5°F | Resp 16 | Wt 92.5 lb

## 2020-10-17 DIAGNOSIS — R531 Weakness: Secondary | ICD-10-CM | POA: Insufficient documentation

## 2020-10-17 DIAGNOSIS — R5381 Other malaise: Secondary | ICD-10-CM | POA: Insufficient documentation

## 2020-10-17 DIAGNOSIS — I10 Essential (primary) hypertension: Secondary | ICD-10-CM | POA: Insufficient documentation

## 2020-10-17 DIAGNOSIS — K219 Gastro-esophageal reflux disease without esophagitis: Secondary | ICD-10-CM | POA: Diagnosis not present

## 2020-10-17 DIAGNOSIS — D649 Anemia, unspecified: Secondary | ICD-10-CM | POA: Diagnosis not present

## 2020-10-17 DIAGNOSIS — Z79899 Other long term (current) drug therapy: Secondary | ICD-10-CM | POA: Insufficient documentation

## 2020-10-17 DIAGNOSIS — R109 Unspecified abdominal pain: Secondary | ICD-10-CM | POA: Diagnosis not present

## 2020-10-17 DIAGNOSIS — K750 Abscess of liver: Secondary | ICD-10-CM | POA: Insufficient documentation

## 2020-10-17 DIAGNOSIS — R5383 Other fatigue: Secondary | ICD-10-CM | POA: Insufficient documentation

## 2020-10-17 DIAGNOSIS — K5900 Constipation, unspecified: Secondary | ICD-10-CM | POA: Diagnosis not present

## 2020-10-17 DIAGNOSIS — C569 Malignant neoplasm of unspecified ovary: Secondary | ICD-10-CM | POA: Insufficient documentation

## 2020-10-17 DIAGNOSIS — E871 Hypo-osmolality and hyponatremia: Secondary | ICD-10-CM | POA: Insufficient documentation

## 2020-10-17 DIAGNOSIS — M1990 Unspecified osteoarthritis, unspecified site: Secondary | ICD-10-CM | POA: Diagnosis not present

## 2020-10-17 DIAGNOSIS — Z5111 Encounter for antineoplastic chemotherapy: Secondary | ICD-10-CM | POA: Insufficient documentation

## 2020-10-17 LAB — COMPREHENSIVE METABOLIC PANEL
ALT: 15 U/L (ref 0–44)
AST: 36 U/L (ref 15–41)
Albumin: 2.9 g/dL — ABNORMAL LOW (ref 3.5–5.0)
Alkaline Phosphatase: 65 U/L (ref 38–126)
Anion gap: 11 (ref 5–15)
BUN: 28 mg/dL — ABNORMAL HIGH (ref 8–23)
CO2: 24 mmol/L (ref 22–32)
Calcium: 8.5 mg/dL — ABNORMAL LOW (ref 8.9–10.3)
Chloride: 98 mmol/L (ref 98–111)
Creatinine, Ser: 0.79 mg/dL (ref 0.44–1.00)
GFR, Estimated: >60 mL/min (ref >60)
Glucose, Bld: 98 mg/dL (ref 70–99)
Potassium: 4.4 mmol/L (ref 3.5–5.1)
Sodium: 133 mmol/L — ABNORMAL LOW (ref 135–145)
Total Bilirubin: 0.5 mg/dL (ref 0.3–1.2)
Total Protein: 5.9 g/dL — ABNORMAL LOW (ref 6.5–8.1)

## 2020-10-17 LAB — CBC WITH DIFFERENTIAL/PLATELET
Abs Immature Granulocytes: 0.03 10*3/uL (ref 0.00–0.07)
Basophils Absolute: 0 10*3/uL (ref 0.0–0.1)
Basophils Relative: 0 %
Eosinophils Absolute: 0 10*3/uL (ref 0.0–0.5)
Eosinophils Relative: 0 %
HCT: 26.9 % — ABNORMAL LOW (ref 36.0–46.0)
Hemoglobin: 8.4 g/dL — ABNORMAL LOW (ref 12.0–15.0)
Immature Granulocytes: 0 %
Lymphocytes Relative: 7 %
Lymphs Abs: 0.5 10*3/uL — ABNORMAL LOW (ref 0.7–4.0)
MCH: 28.4 pg (ref 26.0–34.0)
MCHC: 31.2 g/dL (ref 30.0–36.0)
MCV: 90.9 fL (ref 80.0–100.0)
Monocytes Absolute: 0.7 10*3/uL (ref 0.1–1.0)
Monocytes Relative: 10 %
Neutro Abs: 6.1 10*3/uL (ref 1.7–7.7)
Neutrophils Relative %: 83 %
Platelets: 307 10*3/uL (ref 150–400)
RBC: 2.96 MIL/uL — ABNORMAL LOW (ref 3.87–5.11)
RDW: 17.3 % — ABNORMAL HIGH (ref 11.5–15.5)
WBC: 7.3 10*3/uL (ref 4.0–10.5)
nRBC: 0 % (ref 0.0–0.2)

## 2020-10-17 MED ORDER — PROCHLORPERAZINE MALEATE 10 MG PO TABS
10.0000 mg | ORAL_TABLET | Freq: Once | ORAL | Status: AC
  Administered 2020-10-17: 10 mg via ORAL
  Filled 2020-10-17: qty 1

## 2020-10-17 MED ORDER — SODIUM CHLORIDE 0.9% FLUSH
10.0000 mL | Freq: Once | INTRAVENOUS | Status: AC
  Administered 2020-10-17: 10 mL via INTRAVENOUS
  Filled 2020-10-17: qty 10

## 2020-10-17 MED ORDER — HEPARIN SOD (PORK) LOCK FLUSH 100 UNIT/ML IV SOLN
INTRAVENOUS | Status: AC
  Filled 2020-10-17: qty 5

## 2020-10-17 MED ORDER — GEMCITABINE HCL CHEMO INJECTION 1 GM/26.3ML
1000.0000 mg | Freq: Once | INTRAVENOUS | Status: AC
  Administered 2020-10-17: 988.5932 mg via INTRAVENOUS
  Filled 2020-10-17: qty 26

## 2020-10-17 MED ORDER — HEPARIN SOD (PORK) LOCK FLUSH 100 UNIT/ML IV SOLN
500.0000 [IU] | Freq: Once | INTRAVENOUS | Status: AC
  Administered 2020-10-17: 500 [IU] via INTRAVENOUS
  Filled 2020-10-17: qty 5

## 2020-10-17 MED ORDER — SODIUM CHLORIDE 0.9 % IV SOLN
Freq: Once | INTRAVENOUS | Status: AC
  Filled 2020-10-17: qty 250

## 2020-10-17 NOTE — Progress Notes (Signed)
Patient here for oncology follow-up appointment, expresses no complaints or concerns at this time.    

## 2020-10-17 NOTE — Progress Notes (Signed)
Pt received gemzar infusion in clinic today. Tolerated well. No complaints at d/c.

## 2020-10-18 LAB — CA 125: Cancer Antigen (CA) 125: 416 U/mL — ABNORMAL HIGH (ref 0.0–38.1)

## 2020-10-26 NOTE — Progress Notes (Unsigned)
[name]  Telephone:(336) [phone] Fax:(336) [phone]  ID: Tamara Mckinney OB: May 21, 1928  MR#: [id number]  AST#:[id number]  Patient Care Team: [name], MD as PCP - General (Family Medicine) [name], RN as Registered Nurse Herbert Pun, MD as Consulting Physician (General Surgery) Lloyd Huger, MD as Consulting Physician (Oncology)  CHIEF COMPLAINT: Stage IIIC ovarian cancer.  INTERVAL HISTORY: Patient returns to clinic today for further evaluation and consideration of cycle 12, day 1 of single agent gemcitabine.  She complains of weakness and fatigue and poor memory, but otherwise feels well.  She is tolerating her treatments without significant side effects.  She continues to have occasional back and abdominal pain which is well controlled with Tylenol.  She has intermittent constipation.  She has no neurologic complaints.  She has a fair appetite and her weight has remained stable.  She denies any chest pain, shortness of breath, cough, or hemoptysis.  She does not complain of black tarry stools today.  She has no urinary complaints.  Patient offers no further specific complaints today.  REVIEW OF SYSTEMS:   Review of Systems  Constitutional: Positive for malaise/fatigue. Negative for fever and weight loss.  Respiratory: Negative.  Negative for cough and shortness of breath.   Cardiovascular: Negative.  Negative for chest pain and leg swelling.  Gastrointestinal: Negative.  Negative for abdominal pain, blood in stool, constipation, diarrhea, heartburn, melena, nausea and vomiting.  Genitourinary: Negative.  Negative for dysuria.  Musculoskeletal: Positive for back pain. Negative for neck pain.  Skin: Negative.  Negative for itching and rash.  Neurological: Positive for weakness. Negative for dizziness and focal weakness.  Endo/Heme/Allergies: Does not bruise/bleed easily.  Psychiatric/Behavioral: Positive for memory loss. The  patient is not nervous/anxious.     As per HPI. Otherwise, a complete review of systems is negative.  PAST MEDICAL HISTORY: Past Medical History:  Diagnosis Date  . Anemia   . Arthritis   . Asthma   . Dyspnea   . GERD (gastroesophageal reflux disease)   . Hypertension   . Ovarian cancer ([place]) 2018    PAST SURGICAL HISTORY: Past Surgical History:  Procedure Laterality Date  . ABDOMINAL HYSTERECTOMY    . BREAST BIOPSY     x6  . BUNIONECTOMY Bilateral   . CATARACT EXTRACTION, BILATERAL    . FLEXIBLE SIGMOIDOSCOPY N/A 05/21/2017   Procedure: FLEXIBLE SIGMOIDOSCOPY;  Surgeon: Lin Landsman, MD;  Location: [facility] ENDOSCOPY;  Service: Gastroenterology;  Laterality: N/A;  . INCONTINENCE SURGERY    . PORTA CATH INSERTION N/A 06/16/2017   Procedure: PORTA CATH INSERTION;  Surgeon: Algernon Huxley, MD;  Location: [place] CV LAB;  Service: Cardiovascular;  Laterality: N/A;  . RECTAL SURGERY  04/2018  . REPAIR OF RECTAL PROLAPSE N/A 05/11/2017   Procedure: REPAIR OF RECTAL PROLAPSE;  Surgeon: Leonie Green, MD;  Location: [facility] ORS;  Service: General;  Laterality: N/A;  . TEE WITHOUT CARDIOVERSION N/A 03/22/2020   Procedure: TRANSESOPHAGEAL ECHOCARDIOGRAM (TEE);  Surgeon: Nelva Bush, MD;  Location: [facility] ORS;  Service: Cardiovascular;  Laterality: N/A;  . TONSILLECTOMY    . VAGINA SURGERY     uncertain procedure performed    FAMILY HISTORY: Family History  Problem Relation Age of Onset  . Heart disease Mother   . Heart disease Father   . Heart disease Sister   . Heart attack Sister   . Ulcerative colitis Brother   . Lung cancer Brother   . Thyroid cancer Sister   .  Asthma Sister   . Diabetes Sister   . Asthma Sister   . Pancreatic cancer Sister   . Dementia Sister   . Asthma Brother   . Heart disease Brother   . Asthma Brother   . Lung cancer Brother   . Asthma Brother   . Lung cancer Brother   . Lung cancer Brother   . Rheum arthritis Brother      ADVANCED DIRECTIVES (Y/N):  N  HEALTH MAINTENANCE: Social History   Tobacco Use  . Smoking status: Never Smoker  . Smokeless tobacco: Never Used  Vaping Use  . Vaping Use: Never used  Substance Use Topics  . Alcohol use: No  . Drug use: No     Colonoscopy:  PAP:  Bone density:  Lipid panel:  No Known Allergies  Current Outpatient Medications  Medication Sig Dispense Refill  . acetaminophen (TYLENOL) 500 MG tablet Take 500-1,000 mg by mouth every 6 (six) hours as needed for mild pain, moderate pain or fever.     . cyclobenzaprine (FLEXERIL) 10 MG tablet TAKE ONE TABLET BY MOUTH THREE TIMES A DAY AS NEEDED 30 tablet 0  . feeding supplement, ENSURE ENLIVE, (ENSURE ENLIVE) LIQD Take 237 mLs by mouth 2 (two) times daily between meals. 237 mL 12  . iron polysaccharides (NIFEREX) 150 MG capsule Take 1 capsule (150 mg total) by mouth daily. 90 capsule 1  . lidocaine-prilocaine (EMLA) cream APPLY A SMALL AMOUNT TO PORT SITE AT LEAST 1 HOUR PRIOR TO IT BEING ACCESSED THEN COVER WITH PLASTIC WRAP 30 g 0  . magic mouthwash w/lidocaine SOLN Take 5 mLs by mouth 4 (four) times daily as needed for mouth pain. (Patient not taking: No sig reported) 240 mL 1  . meclizine (ANTIVERT) 25 MG tablet Take 25 mg by mouth 3 (three) times daily as needed for dizziness.    . montelukast (SINGULAIR) 10 MG tablet Take 10 mg by mouth at bedtime as needed (allergy symptoms).  (Patient not taking: No sig reported)    . ondansetron (ZOFRAN) 4 MG tablet Take 1 tablet (4 mg total) by mouth every 6 (six) hours as needed for nausea. (Patient not taking: Reported on 10/17/2020) 20 tablet 0  . polyethylene glycol (MIRALAX / GLYCOLAX) packet Take 17 g by mouth daily as needed for mild constipation or moderate constipation.     . traMADol (ULTRAM) 50 MG tablet TAKE ONE TABLET BY MOUTH EVERY 6 HOURS AS NEEDED 30 tablet 0  . valsartan (DIOVAN) 160 MG tablet Take 160 mg by mouth as needed. (Patient not taking: No sig  reported)     No current facility-administered medications for this visit.   Facility-Administered Medications Ordered in Other Visits  Medication Dose Route Frequency Provider Last Rate Last Admin  . 0.9 %  sodium chloride infusion   Intravenous Once Lloyd Huger, MD      . gemcitabine [facility]) [phone] mg in sodium chloride 0.9 % 250 mL chemo infusion  [phone] mg Intravenous Once Lloyd Huger, MD      . heparin lock flush 100 unit/mL  500 Units Intravenous Once Lloyd Huger, MD      . sodium chloride flush (NS) 0.9 % injection 10 mL  10 mL Intravenous PRN Lloyd Huger, MD   10 mL at 03/10/18 1200    OBJECTIVE: Vitals:   10/31/20 1019  BP: (!) 149/77  Pulse: 96  Resp: 18     Body mass index is 17.36 kg/m.  ECOG FS:0 - Asymptomatic  General: Thin, no acute distress. Eyes: Pink conjunctiva, anicteric sclera. HEENT: Normocephalic, moist mucous membranes. Lungs: No audible wheezing or coughing. Heart: Regular rate and rhythm. Abdomen: Soft, nontender, no obvious distention. Musculoskeletal: No edema, cyanosis, or clubbing. Neuro: Alert, answering all questions appropriately. Cranial nerves grossly intact. Skin: No rashes or petechiae noted. Psych: Normal affect.   LAB RESULTS:  Lab Results  Component Value Date   NA 133 (L) 10/31/2020   K 4.0 10/31/2020   CL 98 10/31/2020   CO2 25 10/31/2020   GLUCOSE 87 10/31/2020   BUN 34 (H) 10/31/2020   CREATININE 0.80 10/31/2020   CALCIUM 8.3 (L) 10/31/2020   PROT 5.9 (L) 10/31/2020   ALBUMIN 2.9 (L) 10/31/2020   AST 41 10/31/2020   ALT 15 10/31/2020   ALKPHOS 67 10/31/2020   BILITOT 0.5 10/31/2020   GFRNONAA >60 10/31/2020   GFRAA >60 05/16/2020    Lab Results  Component Value Date   WBC 10.4 10/31/2020   NEUTROABS 8.5 (H) 10/31/2020   HGB 8.3 (L) 10/31/2020   HCT 26.3 (L) 10/31/2020   MCV 90.7 10/31/2020   PLT 607 (H) 10/31/2020     STUDIES: No results found.  ASSESSMENT: Stage  IIIC ovarian cancer.  PLAN:    1. Stage IIIC ovarian cancer: Patient is now considered platinum refractory and is receiving single agent gemcitabine. Plan to give treatment on days 1, 8, and 15 with a 22 off. CT scan results from May 06, 2020 reviewed independently with progression of disease.  Since taking time off, patient's CA-125 has trended up to 539.0, but has declined to 392.0 and now seems to have plateaued around 400. Her most recent result is 416.  Patient stated she is "tired" and may want to discontinue treatment in the near future.  Proceed with cycle 12, day 1 of treatment today.  Return to clinic in 1 week for further evaluation and consideration of cycle 12, day 8.   2.  Liver abscess: Improved per CT scan.  Appreciate infectious disease input.  Patient has now completed IV antibiotics.  Proceed with treatment as above.   3. Lupus anticoagulant: Patient was noted to have an elevated PTT as well as increased bleeding during her surgery for rectal prolapse.   4. Anemia: Chronic and unchanged.  Multifactorial including heme positive stools secondary to GI bleed as well as chemotherapy.  Given patient's age and comorbidities, will not refer for EGD or colonoscopy at this time, but if there is a significant bleed or drop in hemoglobin may reconsider.  Patient's hemoglobin is 8.3 today.  Will get iron stores at next lab draw. 5.  Constipation: Intermittent.  Continue OTC remedies as needed. 6.  Hyponatremia: Chronic and unchanged.  Monitor. 7.  Back/abdominal pain: Chronic and unchanged.  Patient states tramadol is "too strong", but agreed to take it if her symptoms get worse.  Continue Tylenol as needed.  Imaging as above. 8.  Blood in stool: Patient has a history of hemorrhoids as well as rectal prolapse.  Hemoccult cards are positive, but repeat testing was negative.  Continue to monitor closely. 9.  Weakness and fatigue: Chronic and unchanged. 10. Memory: Chronic and unchanged. 11.  Reflux: Continue Nexium as needed. 12.  Vertigo, nausea/vomiting: Resolved.  Patient expressed understanding and was in agreement with this plan. She also understands that She can call clinic at any time with any questions, concerns, or complaints.   Cancer Staging Malignant neoplasm of ovary ([place])  Staging form: Ovary, Fallopian Tube, and Primary Peritoneal Carcinoma, AJCC 8th Edition - Clinical stage from 05/29/2017: Stage IIIC (cT3c, cN1b, cM0) - Signed by Lloyd Huger, MD on 05/29/2017   Lloyd Huger, MD   10/31/2020 11:08 AM

## 2020-10-31 ENCOUNTER — Inpatient Hospital Stay: Payer: Medicare PPO

## 2020-10-31 ENCOUNTER — Other Ambulatory Visit: Payer: Self-pay | Admitting: Oncology

## 2020-10-31 ENCOUNTER — Inpatient Hospital Stay (HOSPITAL_BASED_OUTPATIENT_CLINIC_OR_DEPARTMENT_OTHER): Payer: Medicare PPO | Admitting: Oncology

## 2020-10-31 ENCOUNTER — Other Ambulatory Visit: Payer: Self-pay

## 2020-10-31 VITALS — BP 149/77 | HR 96 | Resp 18 | Wt 91.9 lb

## 2020-10-31 VITALS — BP 138/85 | HR 99 | Temp 98.3°F | Resp 18

## 2020-10-31 DIAGNOSIS — C569 Malignant neoplasm of unspecified ovary: Secondary | ICD-10-CM

## 2020-10-31 DIAGNOSIS — Z5111 Encounter for antineoplastic chemotherapy: Secondary | ICD-10-CM | POA: Diagnosis not present

## 2020-10-31 DIAGNOSIS — Z95828 Presence of other vascular implants and grafts: Secondary | ICD-10-CM

## 2020-10-31 LAB — CBC WITH DIFFERENTIAL/PLATELET
Abs Immature Granulocytes: 0.05 10*3/uL (ref 0.00–0.07)
Basophils Absolute: 0 10*3/uL (ref 0.0–0.1)
Basophils Relative: 0 %
Eosinophils Absolute: 0 10*3/uL (ref 0.0–0.5)
Eosinophils Relative: 0 %
HCT: 26.3 % — ABNORMAL LOW (ref 36.0–46.0)
Hemoglobin: 8.3 g/dL — ABNORMAL LOW (ref 12.0–15.0)
Immature Granulocytes: 1 %
Lymphocytes Relative: 7 %
Lymphs Abs: 0.7 10*3/uL (ref 0.7–4.0)
MCH: 28.6 pg (ref 26.0–34.0)
MCHC: 31.6 g/dL (ref 30.0–36.0)
MCV: 90.7 fL (ref 80.0–100.0)
Monocytes Absolute: 1 10*3/uL (ref 0.1–1.0)
Monocytes Relative: 10 %
Neutro Abs: 8.5 10*3/uL — ABNORMAL HIGH (ref 1.7–7.7)
Neutrophils Relative %: 82 %
Platelets: 607 10*3/uL — ABNORMAL HIGH (ref 150–400)
RBC: 2.9 MIL/uL — ABNORMAL LOW (ref 3.87–5.11)
RDW: 18.2 % — ABNORMAL HIGH (ref 11.5–15.5)
WBC: 10.4 10*3/uL (ref 4.0–10.5)
nRBC: 0 % (ref 0.0–0.2)

## 2020-10-31 LAB — COMPREHENSIVE METABOLIC PANEL
ALT: 15 U/L (ref 0–44)
AST: 41 U/L (ref 15–41)
Albumin: 2.9 g/dL — ABNORMAL LOW (ref 3.5–5.0)
Alkaline Phosphatase: 67 U/L (ref 38–126)
Anion gap: 10 (ref 5–15)
BUN: 34 mg/dL — ABNORMAL HIGH (ref 8–23)
CO2: 25 mmol/L (ref 22–32)
Calcium: 8.3 mg/dL — ABNORMAL LOW (ref 8.9–10.3)
Chloride: 98 mmol/L (ref 98–111)
Creatinine, Ser: 0.8 mg/dL (ref 0.44–1.00)
GFR, Estimated: >60 mL/min (ref >60)
Glucose, Bld: 87 mg/dL (ref 70–99)
Potassium: 4 mmol/L (ref 3.5–5.1)
Sodium: 133 mmol/L — ABNORMAL LOW (ref 135–145)
Total Bilirubin: 0.5 mg/dL (ref 0.3–1.2)
Total Protein: 5.9 g/dL — ABNORMAL LOW (ref 6.5–8.1)

## 2020-10-31 LAB — IRON AND TIBC
Iron: 23 ug/dL — ABNORMAL LOW (ref 28–170)
Saturation Ratios: 8 % — ABNORMAL LOW (ref 10.4–31.8)
TIBC: 288 ug/dL (ref 250–450)
UIBC: 265 ug/dL

## 2020-10-31 LAB — FERRITIN: Ferritin: 152 ng/mL (ref 11–307)

## 2020-10-31 MED ORDER — SODIUM CHLORIDE 0.9% FLUSH
10.0000 mL | Freq: Once | INTRAVENOUS | Status: AC
  Administered 2020-10-31: 10 mL via INTRAVENOUS
  Filled 2020-10-31: qty 10

## 2020-10-31 MED ORDER — SODIUM CHLORIDE 0.9 % IV SOLN
Freq: Once | INTRAVENOUS | Status: AC
  Filled 2020-10-31: qty 250

## 2020-10-31 MED ORDER — GEMCITABINE HCL CHEMO INJECTION 1 GM/26.3ML
1000.0000 mg | Freq: Once | INTRAVENOUS | Status: AC
Start: 2020-10-31 — End: 2020-10-31
  Administered 2020-10-31: 988.5932 mg via INTRAVENOUS
  Filled 2020-10-31: qty 26

## 2020-10-31 MED ORDER — HEPARIN SOD (PORK) LOCK FLUSH 100 UNIT/ML IV SOLN
500.0000 [IU] | Freq: Once | INTRAVENOUS | Status: AC
  Administered 2020-10-31: 500 [IU] via INTRAVENOUS
  Filled 2020-10-31: qty 5

## 2020-10-31 MED ORDER — PROCHLORPERAZINE MALEATE 10 MG PO TABS
10.0000 mg | ORAL_TABLET | Freq: Once | ORAL | Status: AC
  Administered 2020-10-31: 10 mg via ORAL
  Filled 2020-10-31: qty 1

## 2020-10-31 NOTE — Progress Notes (Signed)
Patient tolerated Gemzar infusion well today, no concerns voiced. Patient discharged. Stable.

## 2020-11-01 LAB — CA 125: Cancer Antigen (CA) 125: 539 U/mL — ABNORMAL HIGH (ref 0.0–38.1)

## 2020-11-01 NOTE — Progress Notes (Signed)
[place]  [phone] [phone]) [phone]  ID: Tamara Mckinney OB: 07/19/1928  MR#: [id number]  QBH#:[id number]  Patient Care Team: [name], MD as PCP - General (Family Medicine) [name], RN as Registered Nurse Herbert Pun, MD as Consulting Physician (General Surgery) Lloyd Huger, MD as Consulting Physician (Oncology)  CHIEF COMPLAINT: Stage IIIC ovarian cancer.  INTERVAL HISTORY: Patient returns to clinic today for further evaluation and discussion of discontinuing treatment.  She continues to have chronic weakness and fatigue and a poor memory, but otherwise feels well.  She continues to have occasional back and abdominal pain which is well controlled with tramadol which she takes only at night.  She has intermittent constipation.  She has no neurologic complaints.  She has a fair appetite and her weight has remained stable.  She denies any chest pain, shortness of breath, cough, or hemoptysis.  She does not complain of black tarry stools today.  She has no urinary complaints.  Patient offers no further specific complaints today.  REVIEW OF SYSTEMS:   Review of Systems  Constitutional: Positive for malaise/fatigue. Negative for fever and weight loss.  Respiratory: Negative.  Negative for cough and shortness of breath.   Cardiovascular: Negative.  Negative for chest pain and leg swelling.  Gastrointestinal: Negative.  Negative for abdominal pain, blood in stool, constipation, diarrhea, heartburn, melena, nausea and vomiting.  Genitourinary: Negative.  Negative for dysuria.  Musculoskeletal: Positive for back pain. Negative for neck pain.  Skin: Negative.  Negative for itching and rash.  Neurological: Positive for weakness. Negative for dizziness and focal weakness.  Endo/Heme/Allergies: Does not bruise/bleed easily.  Psychiatric/Behavioral: Positive for memory loss. The patient is not nervous/anxious.     As per HPI.  Otherwise, a complete review of systems is negative.  PAST MEDICAL HISTORY: Past Medical History:  Diagnosis Date  . Anemia   . Arthritis   . Asthma   . Dyspnea   . GERD (gastroesophageal reflux disease)   . Hypertension   . Ovarian cancer ([place]) 2018    PAST SURGICAL HISTORY: Past Surgical History:  Procedure Laterality Date  . ABDOMINAL HYSTERECTOMY    . BREAST BIOPSY     x6  . BUNIONECTOMY Bilateral   . CATARACT EXTRACTION, BILATERAL    . FLEXIBLE SIGMOIDOSCOPY N/A 05/21/2017   Procedure: FLEXIBLE SIGMOIDOSCOPY;  Surgeon: Lin Landsman, MD;  Location: [facility] ENDOSCOPY;  Service: Gastroenterology;  Laterality: N/A;  . INCONTINENCE SURGERY    . PORTA CATH INSERTION N/A 06/16/2017   Procedure: PORTA CATH INSERTION;  Surgeon: Algernon Huxley, MD;  Location: [place] [facility] LAB;  Service: Cardiovascular;  Laterality: N/A;  . RECTAL SURGERY  04/2018  . REPAIR OF RECTAL PROLAPSE N/A 05/11/2017   Procedure: REPAIR OF RECTAL PROLAPSE;  Surgeon: Leonie Green, MD;  Location: [facility] ORS;  Service: General;  Laterality: N/A;  . TEE WITHOUT CARDIOVERSION N/A 03/22/2020   Procedure: TRANSESOPHAGEAL ECHOCARDIOGRAM (TEE);  Surgeon: Nelva Bush, MD;  Location: [facility] ORS;  Service: Cardiovascular;  Laterality: N/A;  . TONSILLECTOMY    . VAGINA SURGERY     uncertain procedure performed    FAMILY HISTORY: Family History  Problem Relation Age of Onset  . Heart disease Mother   . Heart disease Father   . Heart disease Sister   . Heart attack Sister   . Ulcerative colitis Brother   . Lung cancer Brother   . Thyroid cancer Sister   . Asthma Sister   . Diabetes  Sister   . Asthma Sister   . Pancreatic cancer Sister   . Dementia Sister   . Asthma Brother   . Heart disease Brother   . Asthma Brother   . Lung cancer Brother   . Asthma Brother   . Lung cancer Brother   . Lung cancer Brother   . Rheum arthritis Brother     ADVANCED DIRECTIVES (Y/N):  N  HEALTH  MAINTENANCE: Social History   Tobacco Use  . Smoking status: Never Smoker  . Smokeless tobacco: Never Used  Vaping Use  . Vaping Use: Never used  Substance Use Topics  . Alcohol use: No  . Drug use: No     Colonoscopy:  PAP:  Bone density:  Lipid panel:  No Known Allergies  Current Outpatient Medications  Medication Sig Dispense Refill  . acetaminophen (TYLENOL) 500 MG tablet Take 500-1,000 mg by mouth every 6 (six) hours as needed for mild pain, moderate pain or fever.     . feeding supplement, ENSURE ENLIVE, (ENSURE ENLIVE) LIQD Take 237 mLs by mouth 2 (two) times daily between meals. 237 mL 12  . lidocaine-prilocaine (EMLA) cream APPLY A SMALL AMOUNT TO PORT SITE AT LEAST 1 HOUR PRIOR TO IT BEING ACCESSED THEN COVER WITH PLASTIC WRAP 30 g 0  . meclizine (ANTIVERT) 25 MG tablet Take 25 mg by mouth 3 (three) times daily as needed for dizziness.    . montelukast (SINGULAIR) 10 MG tablet Take 10 mg by mouth at bedtime as needed (allergy symptoms).    . ondansetron (ZOFRAN) 4 MG tablet Take 1 tablet (4 mg total) by mouth every 6 (six) hours as needed for nausea. 20 tablet 0  . polyethylene glycol (MIRALAX / GLYCOLAX) packet Take 17 g by mouth daily as needed for mild constipation or moderate constipation.     . cyclobenzaprine (FLEXERIL) 10 MG tablet TAKE ONE TABLET BY MOUTH THREE TIMES A DAY AS NEEDED (Patient not taking: Reported on 11/07/2020) 30 tablet 0  . iron polysaccharides (NIFEREX) 150 MG capsule Take 1 capsule (150 mg total) by mouth daily. (Patient not taking: Reported on 11/07/2020) 90 capsule 1  . traMADol (ULTRAM) 50 MG tablet Take 1 tablet (50 mg total) by mouth every 6 (six) hours as needed. 90 tablet 1  . valsartan (DIOVAN) 160 MG tablet Take 160 mg by mouth as needed. (Patient not taking: No sig reported)     No current facility-administered medications for this visit.   Facility-Administered Medications Ordered in Other Visits  Medication Dose Route Frequency  Provider Last Rate Last Admin  . sodium chloride flush (NS) 0.9 % injection 10 mL  10 mL Intravenous PRN Lloyd Huger, MD   10 mL at 03/10/18 1200    OBJECTIVE: Vitals:   11/07/20 0944  BP: (!) 153/79  Pulse: 85  Resp: 16  Temp: 97.9 F (36.6 C)  SpO2: 96%     Body mass index is 17.35 kg/m.    ECOG FS:0 - Asymptomatic  General: Thin, no acute distress. Eyes: Pink conjunctiva, anicteric sclera. HEENT: Normocephalic, moist mucous membranes. Lungs: No audible wheezing or coughing. Heart: Regular rate and rhythm. Abdomen: Soft, nontender, no obvious distention. Musculoskeletal: No edema, cyanosis, or clubbing. Neuro: Alert, answering all questions appropriately. Cranial nerves grossly intact. Skin: No rashes or petechiae noted. Psych: Normal affect.  LAB RESULTS:  Lab Results  Component Value Date   NA 133 (L) 10/31/2020   K 4.0 10/31/2020   CL 98 10/31/2020  CO2 25 10/31/2020   GLUCOSE 87 10/31/2020   BUN 34 (H) 10/31/2020   CREATININE 0.80 10/31/2020   CALCIUM 8.3 (L) 10/31/2020   PROT 5.9 (L) 10/31/2020   ALBUMIN 2.9 (L) 10/31/2020   AST 41 10/31/2020   ALT 15 10/31/2020   ALKPHOS 67 10/31/2020   BILITOT 0.5 10/31/2020   GFRNONAA >60 10/31/2020   GFRAA >60 05/16/2020    Lab Results  Component Value Date   WBC 10.4 10/31/2020   NEUTROABS 8.5 (H) 10/31/2020   HGB 8.3 (L) 10/31/2020   HCT 26.3 (L) 10/31/2020   MCV 90.7 10/31/2020   PLT 607 (H) 10/31/2020     STUDIES: No results found.  ASSESSMENT: Stage IIIC ovarian cancer.  PLAN:    1. Stage IIIC ovarian cancer: Patient has elected to discontinue treatment at this time.  She last received single agent gemcitabine on October 31, 2020.  If she changed her mind and wish to restart treatment, would reinitiate gemcitabine.  No further imaging or laboratory work is needed.  Patient does not require hospice at this time, but may in the future.  No follow-up has been scheduled at this time.   2.  Liver  abscess: Improved per CT scan.  Appreciate infectious disease input.  Patient has now completed IV antibiotics.  No further imaging as above. 3. Lupus anticoagulant: Patient was noted to have an elevated PTT as well as increased bleeding during her surgery for rectal prolapse.   4. Anemia: Chronic and unchanged.  Multifactorial including heme positive stools secondary to GI bleed as well as chemotherapy.  Given patient's age and comorbidities, will not refer for EGD or colonoscopy at this time, but if there is a significant bleed or drop in hemoglobin may reconsider.  Patient declined iron infusion today. 5.  Constipation: Intermittent.  Continue OTC remedies as needed. 6.  Hyponatremia: Chronic and unchanged.  Monitor. 7.  Back/abdominal pain: Chronic and unchanged.  Continue tramadol as needed. 8.  Blood in stool: Patient has a history of hemorrhoids as well as rectal prolapse.  Hemoccult cards are positive, but repeat testing was negative.  9.  Weakness and fatigue: Chronic and unchanged. 10. Memory: Chronic and unchanged. 11. Reflux: Continue Nexium as needed. 12.  Vertigo, nausea/vomiting: Resolved.  I spent a total of 30 minutes reviewing chart data, face-to-face evaluation with the patient, counseling and coordination of care as detailed above.   Patient expressed understanding and was in agreement with this plan. She also understands that She can call clinic at any time with any questions, concerns, or complaints.   Cancer Staging Malignant neoplasm of ovary [facility]) Staging form: Ovary, Fallopian Tube, and Primary Peritoneal Carcinoma, AJCC 8th Edition - Clinical stage from 05/29/2017: Stage IIIC (cT3c, cN1b, cM0) - Signed by Lloyd Huger, MD on 05/29/2017   Lloyd Huger, MD   11/07/2020 2:06 PM

## 2020-11-07 ENCOUNTER — Inpatient Hospital Stay: Payer: Medicare PPO

## 2020-11-07 ENCOUNTER — Inpatient Hospital Stay (HOSPITAL_BASED_OUTPATIENT_CLINIC_OR_DEPARTMENT_OTHER): Payer: Medicare PPO | Admitting: Oncology

## 2020-11-07 ENCOUNTER — Other Ambulatory Visit: Payer: Self-pay

## 2020-11-07 ENCOUNTER — Encounter: Payer: Self-pay | Admitting: Oncology

## 2020-11-07 VITALS — BP 153/79 | HR 85 | Temp 97.9°F | Resp 16 | Ht 61.0 in | Wt 91.8 lb

## 2020-11-07 DIAGNOSIS — Z5111 Encounter for antineoplastic chemotherapy: Secondary | ICD-10-CM | POA: Diagnosis not present

## 2020-11-07 DIAGNOSIS — C569 Malignant neoplasm of unspecified ovary: Secondary | ICD-10-CM

## 2020-11-07 MED ORDER — TRAMADOL HCL 50 MG PO TABS: 50.0000 mg | ORAL_TABLET | Freq: Four times a day (QID) | ORAL | 1 refills | Status: AC | PRN

## 2020-11-11 ENCOUNTER — Other Ambulatory Visit: Payer: Self-pay | Admitting: Oncology

## 2020-11-11 ENCOUNTER — Telehealth: Payer: Self-pay | Admitting: *Deleted

## 2020-11-11 NOTE — Telephone Encounter (Signed)
Pt's family notified of 3/31 appt.

## 2020-11-11 NOTE — Telephone Encounter (Signed)
Son Geoffery Spruce called asking for a return call stating that patient has decided she wants to resume her chemotherapy.

## 2020-11-11 NOTE — Telephone Encounter (Signed)
I added her 3/31 appt back just in case. Is that to soon for her to restart?

## 2020-11-11 NOTE — Telephone Encounter (Signed)
No that's fine.  Thank you!

## 2020-11-11 NOTE — Telephone Encounter (Signed)
That was quick.

## 2020-11-14 ENCOUNTER — Inpatient Hospital Stay: Payer: Medicare PPO

## 2020-11-14 ENCOUNTER — Encounter: Payer: Self-pay | Admitting: Oncology

## 2020-11-14 ENCOUNTER — Ambulatory Visit: Payer: Medicare PPO

## 2020-11-14 ENCOUNTER — Ambulatory Visit: Payer: Medicare PPO | Admitting: Oncology

## 2020-11-14 ENCOUNTER — Inpatient Hospital Stay (HOSPITAL_BASED_OUTPATIENT_CLINIC_OR_DEPARTMENT_OTHER): Payer: Medicare PPO | Admitting: Oncology

## 2020-11-14 ENCOUNTER — Other Ambulatory Visit: Payer: Medicare PPO

## 2020-11-14 ENCOUNTER — Other Ambulatory Visit: Payer: Self-pay | Admitting: Oncology

## 2020-11-14 VITALS — BP 133/79 | HR 91 | Resp 16

## 2020-11-14 VITALS — BP 129/85 | HR 93 | Temp 98.5°F | Resp 16 | Wt 88.2 lb

## 2020-11-14 DIAGNOSIS — D500 Iron deficiency anemia secondary to blood loss (chronic): Secondary | ICD-10-CM

## 2020-11-14 DIAGNOSIS — C569 Malignant neoplasm of unspecified ovary: Secondary | ICD-10-CM

## 2020-11-14 DIAGNOSIS — Z5111 Encounter for antineoplastic chemotherapy: Secondary | ICD-10-CM | POA: Diagnosis not present

## 2020-11-14 LAB — COMPREHENSIVE METABOLIC PANEL
ALT: 16 U/L (ref 0–44)
AST: 42 U/L — ABNORMAL HIGH (ref 15–41)
Albumin: 2.9 g/dL — ABNORMAL LOW (ref 3.5–5.0)
Alkaline Phosphatase: 68 U/L (ref 38–126)
Anion gap: 9 (ref 5–15)
BUN: 28 mg/dL — ABNORMAL HIGH (ref 8–23)
CO2: 26 mmol/L (ref 22–32)
Calcium: 8.2 mg/dL — ABNORMAL LOW (ref 8.9–10.3)
Chloride: 97 mmol/L — ABNORMAL LOW (ref 98–111)
Creatinine, Ser: 0.83 mg/dL (ref 0.44–1.00)
GFR, Estimated: >60 mL/min (ref >60)
Glucose, Bld: 112 mg/dL — ABNORMAL HIGH (ref 70–99)
Potassium: 4.4 mmol/L (ref 3.5–5.1)
Sodium: 132 mmol/L — ABNORMAL LOW (ref 135–145)
Total Bilirubin: 0.8 mg/dL (ref 0.3–1.2)
Total Protein: 5.7 g/dL — ABNORMAL LOW (ref 6.5–8.1)

## 2020-11-14 LAB — CBC WITH DIFFERENTIAL/PLATELET
Abs Immature Granulocytes: 0.06 10*3/uL (ref 0.00–0.07)
Basophils Absolute: 0 10*3/uL (ref 0.0–0.1)
Basophils Relative: 0 %
Eosinophils Absolute: 0 10*3/uL (ref 0.0–0.5)
Eosinophils Relative: 0 %
HCT: 27.4 % — ABNORMAL LOW (ref 36.0–46.0)
Hemoglobin: 8.6 g/dL — ABNORMAL LOW (ref 12.0–15.0)
Immature Granulocytes: 1 %
Lymphocytes Relative: 5 %
Lymphs Abs: 0.6 10*3/uL — ABNORMAL LOW (ref 0.7–4.0)
MCH: 28.1 pg (ref 26.0–34.0)
MCHC: 31.4 g/dL (ref 30.0–36.0)
MCV: 89.5 fL (ref 80.0–100.0)
Monocytes Absolute: 1.1 10*3/uL — ABNORMAL HIGH (ref 0.1–1.0)
Monocytes Relative: 10 %
Neutro Abs: 9.2 10*3/uL — ABNORMAL HIGH (ref 1.7–7.7)
Neutrophils Relative %: 84 %
Platelets: 341 10*3/uL (ref 150–400)
RBC: 3.06 MIL/uL — ABNORMAL LOW (ref 3.87–5.11)
RDW: 17.9 % — ABNORMAL HIGH (ref 11.5–15.5)
WBC: 11 10*3/uL — ABNORMAL HIGH (ref 4.0–10.5)
nRBC: 0 % (ref 0.0–0.2)

## 2020-11-14 LAB — FOLATE: Folate: 31 ng/mL (ref >5.9)

## 2020-11-14 MED ORDER — HEPARIN SOD (PORK) LOCK FLUSH 100 UNIT/ML IV SOLN
500.0000 [IU] | Freq: Once | INTRAVENOUS | Status: AC | PRN
  Administered 2020-11-14: 500 [IU]
  Filled 2020-11-14: qty 5

## 2020-11-14 MED ORDER — SODIUM CHLORIDE 0.9 % IV SOLN
510.0000 mg | Freq: Once | INTRAVENOUS | Status: AC
  Administered 2020-11-14: 510 mg via INTRAVENOUS
  Filled 2020-11-14: qty 510

## 2020-11-14 MED ORDER — SODIUM CHLORIDE 0.9 % IV SOLN
Freq: Once | INTRAVENOUS | Status: AC
  Filled 2020-11-14: qty 250

## 2020-11-14 MED ORDER — PROCHLORPERAZINE MALEATE 10 MG PO TABS
10.0000 mg | ORAL_TABLET | Freq: Once | ORAL | Status: AC
  Administered 2020-11-14: 10 mg via ORAL
  Filled 2020-11-14: qty 1

## 2020-11-14 MED ORDER — SODIUM CHLORIDE 0.9 % IV SOLN
1000.0000 mg | Freq: Once | INTRAVENOUS | Status: AC
  Administered 2020-11-14: 988.5932 mg via INTRAVENOUS
  Filled 2020-11-14: qty 26

## 2020-11-14 MED ORDER — HEPARIN SOD (PORK) LOCK FLUSH 100 UNIT/ML IV SOLN
INTRAVENOUS | Status: AC
  Filled 2020-11-14: qty 5

## 2020-11-14 NOTE — Progress Notes (Signed)
[place]  Telephone:(336) [phone] Fax:(336) [phone]  ID: Tamara Mckinney OB: 13-Jul-1928  MR#: [id number]  WUX#:[id number]  Patient Care Team: [name], MD as PCP - General (Family Medicine) [name], RN as Registered Nurse Herbert Pun, MD as Consulting Physician (General Surgery) Lloyd Huger, MD as Consulting Physician (Oncology)  CHIEF COMPLAINT: Stage IIIC ovarian cancer.  INTERVAL HISTORY: Patient returns to clinic today for further evaluation and consideration of cycle 12, day 8 of single agent gemcitabine.  She recently decided to discontinue treatment, but then quickly changed her mind and wishes to reinitiate therapy.  She continues have chronic weakness and fatigue and a poor memory, but otherwise feels well.  She continues to have occasional back and abdominal pain which is well controlled with tramadol which she takes only at night.  She has intermittent constipation.  She has no neurologic complaints.  She has a fair appetite and her weight has remained stable.  She denies any chest pain, shortness of breath, cough, or hemoptysis.  She does not complain of black tarry stools today.  She has no urinary complaints.  Patient offers no further specific complaints today.  REVIEW OF SYSTEMS:   Review of Systems  Constitutional: Positive for malaise/fatigue. Negative for fever and weight loss.  Respiratory: Negative.  Negative for cough and shortness of breath.   Cardiovascular: Negative.  Negative for chest pain and leg swelling.  Gastrointestinal: Negative.  Negative for abdominal pain, blood in stool, constipation, diarrhea, heartburn, melena, nausea and vomiting.  Genitourinary: Negative.  Negative for dysuria.  Musculoskeletal: Positive for back pain. Negative for neck pain.  Skin: Negative.  Negative for itching and rash.  Neurological: Positive for weakness. Negative for dizziness and focal weakness.  Endo/Heme/Allergies:  Does not bruise/bleed easily.  Psychiatric/Behavioral: Positive for memory loss. The patient is not nervous/anxious.     As per HPI. Otherwise, a complete review of systems is negative.  PAST MEDICAL HISTORY: Past Medical History:  Diagnosis Date  . Anemia   . Arthritis   . Asthma   . Dyspnea   . GERD (gastroesophageal reflux disease)   . Hypertension   . Ovarian cancer ([place]) 2018    PAST SURGICAL HISTORY: Past Surgical History:  Procedure Laterality Date  . ABDOMINAL HYSTERECTOMY    . BREAST BIOPSY     x6  . BUNIONECTOMY Bilateral   . CATARACT EXTRACTION, BILATERAL    . FLEXIBLE SIGMOIDOSCOPY N/A 05/21/2017   Procedure: FLEXIBLE SIGMOIDOSCOPY;  Surgeon: Lin Landsman, MD;  Location: [facility] ENDOSCOPY;  Service: Gastroenterology;  Laterality: N/A;  . INCONTINENCE SURGERY    . PORTA CATH INSERTION N/A 06/16/2017   Procedure: PORTA CATH INSERTION;  Surgeon: Algernon Huxley, MD;  Location: [place] CV LAB;  Service: Cardiovascular;  Laterality: N/A;  . RECTAL SURGERY  04/2018  . REPAIR OF RECTAL PROLAPSE N/A 05/11/2017   Procedure: REPAIR OF RECTAL PROLAPSE;  Surgeon: Leonie Green, MD;  Location: [facility] ORS;  Service: General;  Laterality: N/A;  . TEE WITHOUT CARDIOVERSION N/A 03/22/2020   Procedure: TRANSESOPHAGEAL ECHOCARDIOGRAM (TEE);  Surgeon: Nelva Bush, MD;  Location: [facility] ORS;  Service: Cardiovascular;  Laterality: N/A;  . TONSILLECTOMY    . VAGINA SURGERY     uncertain procedure performed    FAMILY HISTORY: Family History  Problem Relation Age of Onset  . Heart disease Mother   . Heart disease Father   . Heart disease Sister   . Heart attack Sister   . Ulcerative  colitis Brother   . Lung cancer Brother   . Thyroid cancer Sister   . Asthma Sister   . Diabetes Sister   . Asthma Sister   . Pancreatic cancer Sister   . Dementia Sister   . Asthma Brother   . Heart disease Brother   . Asthma Brother   . Lung cancer Brother   . Asthma Brother   .  Lung cancer Brother   . Lung cancer Brother   . Rheum arthritis Brother     ADVANCED DIRECTIVES (Y/N):  N  HEALTH MAINTENANCE: Social History   Tobacco Use  . Smoking status: Never Smoker  . Smokeless tobacco: Never Used  Vaping Use  . Vaping Use: Never used  Substance Use Topics  . Alcohol use: No  . Drug use: No     Colonoscopy:  PAP:  Bone density:  Lipid panel:  No Known Allergies  Current Outpatient Medications  Medication Sig Dispense Refill  . acetaminophen (TYLENOL) 500 MG tablet Take 500-1,000 mg by mouth every 6 (six) hours as needed for mild pain, moderate pain or fever.     . feeding supplement, ENSURE ENLIVE, (ENSURE ENLIVE) LIQD Take 237 mLs by mouth 2 (two) times daily between meals. 237 mL 12  . lidocaine-prilocaine (EMLA) cream APPLY A SMALL AMOUNT TO PORT SITE AT LEAST 1 HOUR PRIOR TO IT BEING ACCESSED THEN COVER WITH PLASTIC WRAP 30 g 0  . meclizine (ANTIVERT) 25 MG tablet Take 25 mg by mouth 3 (three) times daily as needed for dizziness.    . montelukast (SINGULAIR) 10 MG tablet Take 10 mg by mouth at bedtime as needed (allergy symptoms).    . ondansetron (ZOFRAN) 4 MG tablet Take 1 tablet (4 mg total) by mouth every 6 (six) hours as needed for nausea. 20 tablet 0  . polyethylene glycol (MIRALAX / GLYCOLAX) packet Take 17 g by mouth daily as needed for mild constipation or moderate constipation.     . traMADol (ULTRAM) 50 MG tablet Take 1 tablet (50 mg total) by mouth every 6 (six) hours as needed. 90 tablet 1  . valsartan (DIOVAN) 160 MG tablet Take 160 mg by mouth as needed.    . iron polysaccharides (NIFEREX) 150 MG capsule Take 1 capsule (150 mg total) by mouth daily. (Patient not taking: No sig reported) 90 capsule 1   No current facility-administered medications for this visit.   Facility-Administered Medications Ordered in Other Visits  Medication Dose Route Frequency Provider Last Rate Last Admin  . 0.9 %  sodium chloride infusion   Intravenous  Once Lloyd Huger, MD      . gemcitabine [facility]) [phone] mg in sodium chloride 0.9 % 250 mL chemo infusion  [phone] mg Intravenous Once Lloyd Huger, MD      . prochlorperazine (COMPAZINE) tablet 10 mg  10 mg Oral Once Lloyd Huger, MD      . sodium chloride flush (NS) 0.9 % injection 10 mL  10 mL Intravenous PRN Lloyd Huger, MD   10 mL at 03/10/18 1200    OBJECTIVE: Vitals:   11/14/20 1005  BP: 129/85  Pulse: 93  Resp: 16  Temp: 98.5 F (36.9 C)     Body mass index is 16.67 kg/m.    ECOG FS:0 - Asymptomatic  General: Thin, no acute distress. Eyes: Pink conjunctiva, anicteric sclera. HEENT: Normocephalic, moist mucous membranes. Lungs: No audible wheezing or coughing. Heart: Regular rate and rhythm. Abdomen: Soft, nontender, no obvious  distention. Musculoskeletal: No edema, cyanosis, or clubbing. Neuro: Alert, answering all questions appropriately. Cranial nerves grossly intact. Skin: No rashes or petechiae noted. Psych: Normal affect.  LAB RESULTS:  Lab Results  Component Value Date   NA 132 (L) 11/14/2020   K 4.4 11/14/2020   CL 97 (L) 11/14/2020   CO2 26 11/14/2020   GLUCOSE 112 (H) 11/14/2020   BUN 28 (H) 11/14/2020   CREATININE 0.83 11/14/2020   CALCIUM 8.2 (L) 11/14/2020   PROT 5.7 (L) 11/14/2020   ALBUMIN 2.9 (L) 11/14/2020   AST 42 (H) 11/14/2020   ALT 16 11/14/2020   ALKPHOS 68 11/14/2020   BILITOT 0.8 11/14/2020   GFRNONAA >60 11/14/2020   GFRAA >60 05/16/2020    Lab Results  Component Value Date   WBC 11.0 (H) 11/14/2020   NEUTROABS 9.2 (H) 11/14/2020   HGB 8.6 (L) 11/14/2020   HCT 27.4 (L) 11/14/2020   MCV 89.5 11/14/2020   PLT 341 11/14/2020   Lab Results  Component Value Date   IRON 23 (L) 10/31/2020   TIBC 288 10/31/2020   IRONPCTSAT 8 (L) 10/31/2020   Lab Results  Component Value Date   FERRITIN 152 10/31/2020     STUDIES: No results found.  ASSESSMENT: Stage IIIC ovarian cancer.  PLAN:    1.  Stage IIIC ovarian cancer: Patient recently decided to discontinue treatment, but then quickly changed her mind and wishes to proceed with single agent gemcitabine.  Today will be cycle 12, day 8 of treatment.  Return to clinic in 1 week for further evaluation and consideration of cycle 12, day 15.  2.  Liver abscess: Improved per CT scan.  Appreciate infectious disease input.  Patient has now completed IV antibiotics.  No further imaging as above. 3. Lupus anticoagulant: Patient was noted to have an elevated PTT as well as increased bleeding during her surgery for rectal prolapse.   4. Anemia: Chronic and unchanged. Multifactorial including heme positive stools secondary to GI bleed as well as chemotherapy.  Given patient's age and comorbidities, will not refer for EGD or colonoscopy at this time, but if there is a significant bleed or drop in hemoglobin may reconsider.  Proceed with 510 mg IV Feraheme today.  Patient will receive additional Feraheme next week with along with her chemotherapy. 5.  Constipation: Intermittent.  Continue OTC remedies as needed. 6.  Hyponatremia: Chronic and unchanged.  Monitor. 7.  Back/abdominal pain: Chronic and unchanged.  Continue tramadol as needed. 8.  Blood in stool: Patient has a history of hemorrhoids as well as rectal prolapse.  Hemoccult cards are positive, but repeat testing was negative.  9.  Weakness and fatigue: Chronic and unchanged. 10. Memory: Chronic and unchanged. 11. Reflux: Continue Nexium as needed. 12.  Vertigo, nausea/vomiting: Resolved.  Patient expressed understanding and was in agreement with this plan. She also understands that She can call clinic at any time with any questions, concerns, or complaints.   Cancer Staging Malignant neoplasm of ovary [facility]) Staging form: Ovary, Fallopian Tube, and Primary Peritoneal Carcinoma, AJCC 8th Edition - Clinical stage from 05/29/2017: Stage IIIC (cT3c, cN1b, cM0) - Signed by Lloyd Huger, MD  on 05/29/2017   Lloyd Huger, MD   11/14/2020 10:30 AM

## 2020-11-14 NOTE — Progress Notes (Signed)
[place]  Telephone:(336) [phone] Fax:(336) [phone]  ID: Tamara Mckinney OB: 02/10/1928  MR#: [id number]  XLK#:[id number]  Patient Care Team: [name], MD as PCP - General (Family Medicine) [name], RN as Registered Nurse Herbert Pun, MD as Consulting Physician (General Surgery) Lloyd Huger, MD as Consulting Physician (Oncology)  CHIEF COMPLAINT: Stage IIIC ovarian cancer.  INTERVAL HISTORY: Patient comes to clinic today for further evaluation and consideration of cycle 12, day 15 of single agent gemcitabine.  She continues to have chronic weakness and fatigue and a poor memory, but otherwise feels well.  She is tolerating her treatments without significant side effects.  She continues to have occasional back and abdominal pain which is well controlled with tramadol which she takes only at night.  She has intermittent constipation.  She has no neurologic complaints.  She has a fair appetite and her weight has remained stable.  She denies any chest pain, shortness of breath, cough, or hemoptysis.  She does not complain of black tarry stools today.  She has no urinary complaints.  Patient offers no further specific complaints today.  REVIEW OF SYSTEMS:   Review of Systems  Constitutional: Positive for malaise/fatigue. Negative for fever and weight loss.  Respiratory: Negative.  Negative for cough and shortness of breath.   Cardiovascular: Negative.  Negative for chest pain and leg swelling.  Gastrointestinal: Negative.  Negative for abdominal pain, blood in stool, constipation, diarrhea, heartburn, melena, nausea and vomiting.  Genitourinary: Negative.  Negative for dysuria.  Musculoskeletal: Positive for back pain. Negative for neck pain.  Skin: Negative.  Negative for itching and rash.  Neurological: Positive for weakness. Negative for dizziness and focal weakness.  Endo/Heme/Allergies: Does not bruise/bleed easily.   Psychiatric/Behavioral: Positive for memory loss. The patient is not nervous/anxious.     As per HPI. Otherwise, a complete review of systems is negative.  PAST MEDICAL HISTORY: Past Medical History:  Diagnosis Date  . Anemia   . Arthritis   . Asthma   . Dyspnea   . GERD (gastroesophageal reflux disease)   . Hypertension   . Ovarian cancer ([place]) 2018    PAST SURGICAL HISTORY: Past Surgical History:  Procedure Laterality Date  . ABDOMINAL HYSTERECTOMY    . BREAST BIOPSY     x6  . BUNIONECTOMY Bilateral   . CATARACT EXTRACTION, BILATERAL    . FLEXIBLE SIGMOIDOSCOPY N/A 05/21/2017   Procedure: FLEXIBLE SIGMOIDOSCOPY;  Surgeon: Lin Landsman, MD;  Location: [facility] ENDOSCOPY;  Service: Gastroenterology;  Laterality: N/A;  . INCONTINENCE SURGERY    . PORTA CATH INSERTION N/A 06/16/2017   Procedure: PORTA CATH INSERTION;  Surgeon: Algernon Huxley, MD;  Location: [place] CV LAB;  Service: Cardiovascular;  Laterality: N/A;  . RECTAL SURGERY  04/2018  . REPAIR OF RECTAL PROLAPSE N/A 05/11/2017   Procedure: REPAIR OF RECTAL PROLAPSE;  Surgeon: Leonie Green, MD;  Location: [facility] ORS;  Service: General;  Laterality: N/A;  . TEE WITHOUT CARDIOVERSION N/A 03/22/2020   Procedure: TRANSESOPHAGEAL ECHOCARDIOGRAM (TEE);  Surgeon: Nelva Bush, MD;  Location: [facility] ORS;  Service: Cardiovascular;  Laterality: N/A;  . TONSILLECTOMY    . VAGINA SURGERY     uncertain procedure performed    FAMILY HISTORY: Family History  Problem Relation Age of Onset  . Heart disease Mother   . Heart disease Father   . Heart disease Sister   . Heart attack Sister   . Ulcerative colitis Brother   . Lung cancer  Brother   . Thyroid cancer Sister   . Asthma Sister   . Diabetes Sister   . Asthma Sister   . Pancreatic cancer Sister   . Dementia Sister   . Asthma Brother   . Heart disease Brother   . Asthma Brother   . Lung cancer Brother   . Asthma Brother   . Lung cancer Brother   . Lung  cancer Brother   . Rheum arthritis Brother     ADVANCED DIRECTIVES (Y/N):  N  HEALTH MAINTENANCE: Social History   Tobacco Use  . Smoking status: Never Smoker  . Smokeless tobacco: Never Used  Vaping Use  . Vaping Use: Never used  Substance Use Topics  . Alcohol use: No  . Drug use: No     Colonoscopy:  PAP:  Bone density:  Lipid panel:  No Known Allergies  Current Outpatient Medications  Medication Sig Dispense Refill  . acetaminophen (TYLENOL) 500 MG tablet Take 500-1,000 mg by mouth every 6 (six) hours as needed for mild pain, moderate pain or fever.     . feeding supplement, ENSURE ENLIVE, (ENSURE ENLIVE) LIQD Take 237 mLs by mouth 2 (two) times daily between meals. 237 mL 12  . lidocaine-prilocaine (EMLA) cream APPLY A SMALL AMOUNT TO PORT SITE AT LEAST 1 HOUR PRIOR TO IT BEING ACCESSED THEN COVER WITH PLASTIC WRAP 30 g 0  . meclizine (ANTIVERT) 25 MG tablet Take 25 mg by mouth 3 (three) times daily as needed for dizziness.    . montelukast (SINGULAIR) 10 MG tablet Take 10 mg by mouth at bedtime as needed (allergy symptoms).    . ondansetron (ZOFRAN) 4 MG tablet Take 1 tablet (4 mg total) by mouth every 6 (six) hours as needed for nausea. 20 tablet 0  . polyethylene glycol (MIRALAX / GLYCOLAX) packet Take 17 g by mouth daily as needed for mild constipation or moderate constipation.     . traMADol (ULTRAM) 50 MG tablet Take 1 tablet (50 mg total) by mouth every 6 (six) hours as needed. 90 tablet 1  . valsartan (DIOVAN) 160 MG tablet Take 160 mg by mouth as needed.    . iron polysaccharides (NIFEREX) 150 MG capsule Take 1 capsule (150 mg total) by mouth daily. (Patient not taking: No sig reported) 90 capsule 1   No current facility-administered medications for this visit.   Facility-Administered Medications Ordered in Other Visits  Medication Dose Route Frequency Provider Last Rate Last Admin  . gemcitabine (GEMZAR) 988.5932 mg in sodium chloride 0.9 % 250 mL chemo  infusion  988.5932 mg Intravenous Once Lloyd Huger, MD 552 mL/hr at 11/21/20 1047 988.5932 mg at 11/21/20 1047  . heparin lock flush 100 unit/mL  500 Units Intravenous Once Lloyd Huger, MD      . sodium chloride flush (NS) 0.9 % injection 10 mL  10 mL Intravenous PRN Lloyd Huger, MD   10 mL at 03/10/18 1200    OBJECTIVE: Vitals:   11/21/20 0935  BP: (!) 147/77  Pulse: 98  Resp: 18  Temp: 97.9 F (36.6 C)  SpO2: 99%     Body mass index is 16.61 kg/m.    ECOG FS:0 - Asymptomatic  General: Thin, no acute distress. Eyes: Pink conjunctiva, anicteric sclera. HEENT: Normocephalic, moist mucous membranes. Lungs: No audible wheezing or coughing. Heart: Regular rate and rhythm. Abdomen: Soft, nontender, no obvious distention. Musculoskeletal: No edema, cyanosis, or clubbing. Neuro: Alert, answering all questions appropriately. Cranial nerves grossly intact.  Skin: No rashes or petechiae noted. Psych: Normal affect.  LAB RESULTS:  Lab Results  Component Value Date   NA 133 (L) 11/21/2020   K 4.4 11/21/2020   CL 99 11/21/2020   CO2 26 11/21/2020   GLUCOSE 100 (H) 11/21/2020   BUN 30 (H) 11/21/2020   CREATININE 0.78 11/21/2020   CALCIUM 8.5 (L) 11/21/2020   PROT 6.0 (L) 11/21/2020   ALBUMIN 3.0 (L) 11/21/2020   AST 45 (H) 11/21/2020   ALT 21 11/21/2020   ALKPHOS 71 11/21/2020   BILITOT 0.4 11/21/2020   GFRNONAA >60 11/21/2020   GFRAA >60 05/16/2020    Lab Results  Component Value Date   WBC 5.8 11/21/2020   NEUTROABS 4.5 11/21/2020   HGB 8.2 (L) 11/21/2020   HCT 26.2 (L) 11/21/2020   MCV 91.3 11/21/2020   PLT 318 11/21/2020   Lab Results  Component Value Date   IRON 23 (L) 10/31/2020   TIBC 288 10/31/2020   IRONPCTSAT 8 (L) 10/31/2020   Lab Results  Component Value Date   FERRITIN 152 10/31/2020     STUDIES: No results found.  ASSESSMENT: Stage IIIC ovarian cancer.  PLAN:    1. Stage IIIC ovarian cancer: Patient recently decided  to discontinue treatment, but then quickly changed her mind and wishes to proceed with single agent gemcitabine.  Proceed with cycle 12, day 15 of treatment.  Return to clinic in 2 weeks for further evaluation and consideration of cycle 13, day 1.   2.  Liver abscess: Improved per CT scan.  Appreciate infectious disease input.  Patient has now completed IV antibiotics.  No further imaging as above. 3. Lupus anticoagulant: Patient was noted to have an elevated PTT as well as increased bleeding during her surgery for rectal prolapse.   4. Anemia: Chronic and unchanged. Multifactorial including heme positive stools secondary to GI bleed as well as chemotherapy.  Given patient's age and comorbidities, will not refer for EGD or colonoscopy at this time, but if there is a significant bleed or drop in hemoglobin may reconsider.  Patient received IV Feraheme last week, but secondary to insurance purposes this will be switched to IV Venofer.  Proceed with treatment today. 5.  Constipation: Intermittent.  Continue OTC remedies as needed. 6.  Hyponatremia: Chronic and unchanged. 7.  Back/abdominal pain: Chronic and unchanged.  Continue tramadol as needed. 8.  Blood in stool: Patient has a history of hemorrhoids as well as rectal prolapse.  Hemoccult cards are positive, but repeat testing was negative.  9.  Weakness and fatigue: Chronic and unchanged. 10. Memory: Chronic and unchanged. 11. Reflux: Continue Nexium as needed.  Patient expressed understanding and was in agreement with this plan. She also understands that She can call clinic at any time with any questions, concerns, or complaints.   Cancer Staging Malignant neoplasm of ovary [facility]) Staging form: Ovary, Fallopian Tube, and Primary Peritoneal Carcinoma, AJCC 8th Edition - Clinical stage from 05/29/2017: Stage IIIC (cT3c, cN1b, cM0) - Signed by Lloyd Huger, MD on 05/29/2017   Lloyd Huger, MD   11/21/2020 10:59 AM

## 2020-11-14 NOTE — Progress Notes (Signed)
Patient here for follow up no concerns today. 

## 2020-11-15 LAB — CA 125: Cancer Antigen (CA) 125: 540 U/mL — ABNORMAL HIGH (ref 0.0–38.1)

## 2020-11-20 ENCOUNTER — Other Ambulatory Visit: Payer: Self-pay | Admitting: Oncology

## 2020-11-20 ENCOUNTER — Other Ambulatory Visit: Payer: Self-pay | Admitting: *Deleted

## 2020-11-20 DIAGNOSIS — C569 Malignant neoplasm of unspecified ovary: Secondary | ICD-10-CM

## 2020-11-20 NOTE — Progress Notes (Signed)
ca

## 2020-11-21 ENCOUNTER — Other Ambulatory Visit: Payer: Self-pay

## 2020-11-21 ENCOUNTER — Inpatient Hospital Stay (HOSPITAL_BASED_OUTPATIENT_CLINIC_OR_DEPARTMENT_OTHER): Payer: Medicare PPO | Admitting: Oncology

## 2020-11-21 ENCOUNTER — Inpatient Hospital Stay: Payer: Medicare PPO

## 2020-11-21 ENCOUNTER — Encounter: Payer: Self-pay | Admitting: Oncology

## 2020-11-21 ENCOUNTER — Inpatient Hospital Stay: Payer: Medicare PPO | Attending: Oncology

## 2020-11-21 VITALS — BP 147/77 | HR 98 | Temp 97.9°F | Resp 18 | Wt 87.9 lb

## 2020-11-21 VITALS — BP 123/74 | HR 87 | Resp 16

## 2020-11-21 DIAGNOSIS — M1990 Unspecified osteoarthritis, unspecified site: Secondary | ICD-10-CM | POA: Insufficient documentation

## 2020-11-21 DIAGNOSIS — M549 Dorsalgia, unspecified: Secondary | ICD-10-CM | POA: Insufficient documentation

## 2020-11-21 DIAGNOSIS — C569 Malignant neoplasm of unspecified ovary: Secondary | ICD-10-CM

## 2020-11-21 DIAGNOSIS — D649 Anemia, unspecified: Secondary | ICD-10-CM | POA: Diagnosis not present

## 2020-11-21 DIAGNOSIS — K219 Gastro-esophageal reflux disease without esophagitis: Secondary | ICD-10-CM | POA: Insufficient documentation

## 2020-11-21 DIAGNOSIS — Z800 Family history of malignant neoplasm of digestive organs: Secondary | ICD-10-CM | POA: Insufficient documentation

## 2020-11-21 DIAGNOSIS — R791 Abnormal coagulation profile: Secondary | ICD-10-CM | POA: Insufficient documentation

## 2020-11-21 DIAGNOSIS — R109 Unspecified abdominal pain: Secondary | ICD-10-CM | POA: Diagnosis not present

## 2020-11-21 DIAGNOSIS — K921 Melena: Secondary | ICD-10-CM | POA: Insufficient documentation

## 2020-11-21 DIAGNOSIS — R531 Weakness: Secondary | ICD-10-CM | POA: Insufficient documentation

## 2020-11-21 DIAGNOSIS — Z5111 Encounter for antineoplastic chemotherapy: Secondary | ICD-10-CM | POA: Insufficient documentation

## 2020-11-21 DIAGNOSIS — K5900 Constipation, unspecified: Secondary | ICD-10-CM | POA: Diagnosis not present

## 2020-11-21 DIAGNOSIS — Z79899 Other long term (current) drug therapy: Secondary | ICD-10-CM | POA: Diagnosis not present

## 2020-11-21 DIAGNOSIS — E871 Hypo-osmolality and hyponatremia: Secondary | ICD-10-CM | POA: Diagnosis not present

## 2020-11-21 DIAGNOSIS — I10 Essential (primary) hypertension: Secondary | ICD-10-CM | POA: Insufficient documentation

## 2020-11-21 DIAGNOSIS — R5381 Other malaise: Secondary | ICD-10-CM | POA: Insufficient documentation

## 2020-11-21 DIAGNOSIS — R5382 Chronic fatigue, unspecified: Secondary | ICD-10-CM | POA: Insufficient documentation

## 2020-11-21 DIAGNOSIS — R140 Abdominal distension (gaseous): Secondary | ICD-10-CM | POA: Diagnosis not present

## 2020-11-21 DIAGNOSIS — D500 Iron deficiency anemia secondary to blood loss (chronic): Secondary | ICD-10-CM

## 2020-11-21 DIAGNOSIS — Z801 Family history of malignant neoplasm of trachea, bronchus and lung: Secondary | ICD-10-CM | POA: Diagnosis not present

## 2020-11-21 DIAGNOSIS — Z8719 Personal history of other diseases of the digestive system: Secondary | ICD-10-CM | POA: Insufficient documentation

## 2020-11-21 DIAGNOSIS — K750 Abscess of liver: Secondary | ICD-10-CM | POA: Diagnosis not present

## 2020-11-21 LAB — CBC WITH DIFFERENTIAL/PLATELET
Abs Immature Granulocytes: 0.04 10*3/uL (ref 0.00–0.07)
Basophils Absolute: 0 10*3/uL (ref 0.0–0.1)
Basophils Relative: 0 %
Eosinophils Absolute: 0 10*3/uL (ref 0.0–0.5)
Eosinophils Relative: 0 %
HCT: 26.2 % — ABNORMAL LOW (ref 36.0–46.0)
Hemoglobin: 8.2 g/dL — ABNORMAL LOW (ref 12.0–15.0)
Immature Granulocytes: 1 %
Lymphocytes Relative: 11 %
Lymphs Abs: 0.7 10*3/uL (ref 0.7–4.0)
MCH: 28.6 pg (ref 26.0–34.0)
MCHC: 31.3 g/dL (ref 30.0–36.0)
MCV: 91.3 fL (ref 80.0–100.0)
Monocytes Absolute: 0.6 10*3/uL (ref 0.1–1.0)
Monocytes Relative: 11 %
Neutro Abs: 4.5 10*3/uL (ref 1.7–7.7)
Neutrophils Relative %: 77 %
Platelets: 318 10*3/uL (ref 150–400)
RBC: 2.87 MIL/uL — ABNORMAL LOW (ref 3.87–5.11)
RDW: 17.7 % — ABNORMAL HIGH (ref 11.5–15.5)
WBC: 5.8 10*3/uL (ref 4.0–10.5)
nRBC: 0 % (ref 0.0–0.2)

## 2020-11-21 LAB — COMPREHENSIVE METABOLIC PANEL
ALT: 21 U/L (ref 0–44)
AST: 45 U/L — ABNORMAL HIGH (ref 15–41)
Albumin: 3 g/dL — ABNORMAL LOW (ref 3.5–5.0)
Alkaline Phosphatase: 71 U/L (ref 38–126)
Anion gap: 8 (ref 5–15)
BUN: 30 mg/dL — ABNORMAL HIGH (ref 8–23)
CO2: 26 mmol/L (ref 22–32)
Calcium: 8.5 mg/dL — ABNORMAL LOW (ref 8.9–10.3)
Chloride: 99 mmol/L (ref 98–111)
Creatinine, Ser: 0.78 mg/dL (ref 0.44–1.00)
GFR, Estimated: >60 mL/min (ref >60)
Glucose, Bld: 100 mg/dL — ABNORMAL HIGH (ref 70–99)
Potassium: 4.4 mmol/L (ref 3.5–5.1)
Sodium: 133 mmol/L — ABNORMAL LOW (ref 135–145)
Total Bilirubin: 0.4 mg/dL (ref 0.3–1.2)
Total Protein: 6 g/dL — ABNORMAL LOW (ref 6.5–8.1)

## 2020-11-21 MED ORDER — PROCHLORPERAZINE MALEATE 10 MG PO TABS
10.0000 mg | ORAL_TABLET | Freq: Once | ORAL | Status: AC
Start: 2020-11-21 — End: 2020-11-21
  Administered 2020-11-21: 10 mg via ORAL
  Filled 2020-11-21: qty 1

## 2020-11-21 MED ORDER — SODIUM CHLORIDE 0.9 % IV SOLN
1000.0000 mg | Freq: Once | INTRAVENOUS | Status: AC
  Administered 2020-11-21: 988.5932 mg via INTRAVENOUS
  Filled 2020-11-21: qty 26

## 2020-11-21 MED ORDER — HEPARIN SOD (PORK) LOCK FLUSH 100 UNIT/ML IV SOLN
INTRAVENOUS | Status: AC
  Filled 2020-11-21: qty 5

## 2020-11-21 MED ORDER — HEPARIN SOD (PORK) LOCK FLUSH 100 UNIT/ML IV SOLN
500.0000 [IU] | Freq: Once | INTRAVENOUS | Status: AC
  Administered 2020-11-21: 500 [IU] via INTRAVENOUS
  Filled 2020-11-21: qty 5

## 2020-11-21 MED ORDER — IRON SUCROSE 20 MG/ML IV SOLN
200.0000 mg | Freq: Once | INTRAVENOUS | Status: AC
  Administered 2020-11-21: 200 mg via INTRAVENOUS
  Filled 2020-11-21: qty 10

## 2020-11-21 MED ORDER — SODIUM CHLORIDE 0.9% FLUSH
10.0000 mL | Freq: Once | INTRAVENOUS | Status: AC
Start: 2020-11-21 — End: 2020-11-21
  Administered 2020-11-21: 10 mL via INTRAVENOUS
  Filled 2020-11-21: qty 10

## 2020-11-21 MED ORDER — SODIUM CHLORIDE 0.9 % IV SOLN: 200.0000 mg | Freq: Once | INTRAVENOUS | Status: DC

## 2020-11-21 MED ORDER — SODIUM CHLORIDE 0.9 % IV SOLN
Freq: Once | INTRAVENOUS | Status: AC
  Filled 2020-11-21: qty 250

## 2020-11-21 NOTE — Progress Notes (Signed)
Patient here today for follow up, treatment. Patient reports dizziness.

## 2020-11-21 NOTE — Progress Notes (Signed)
Nutrition Assessment   Reason for Assessment:  Patient identified on Malnutrition Screening report for weight loss   ASSESSMENT:  87 year old female with ovarian cancer. Past medical history of GERD, HTN, anemia and memory issues.  Patient receiving gemcitabine.  Met with patient in infusion.  Patient reports that she does not have much of an appetite.  Says that she cooks and lives by herself next door to her son Tamara Mckinney.  He checks on her each day.  Says that she ate oatmeal today for breakfast mixed with water that had nuts and fruit in it.  Says that for lunch she sometimes has meat and vegetables.  "My memory is not good."  Patient says that she eats 3 meals per day.  Drinks ensure 1-2 times per day.     Medications: zofran, miralax,    Labs: reviewed   Anthropometrics:   Height: 61 inches Weight: 87 lb today 09/26/2020 94 lb  BMI: 16  7% weight loss in the last 2 months, significant   Estimated Energy Needs  Kcals: 1200-1400 Protein: 60-70 g Fluid: 1.2 L   NUTRITION DIAGNOSIS: Inadequate oral intake related to cancer and cancer related treatments as well as anemia as evidenced by 7% weight loss in the last 2 months and decreased appetite   INTERVENTION:  Discussed ways to add calories and protein in diet. Handout provided.  Patient agreeable to RD calling son, Tamara Mckinney to discuss nutrition due to memory issues.   RD called Tamara Mckinney and discussed ways to add calories and protein.  Tamara Mckinney appreciative of RD call.   Samples of ensure complete, boost plus given to patient today.    MONITORING, EVALUATION, GOAL: weight trends, intake   Next Visit: as needed  Tamara Mckinney B. Zenia Resides, [place], [place] Registered Dietitian [phone] (mobile)

## 2020-11-22 LAB — CA 125: Cancer Antigen (CA) 125: 476 U/mL — ABNORMAL HIGH (ref 0.0–38.1)

## 2020-12-01 NOTE — Progress Notes (Signed)
[place]  Telephone:(336) [phone] Fax:(336) [phone]  ID: Tamara Mckinney OB: Mar 12, 1928  MR#: [id number]  POE#:[id number]  Patient Care Team: [name], MD as PCP - General (Family Medicine) [name], RN as Registered Nurse Herbert Pun, MD as Consulting Physician (General Surgery) Lloyd Huger, MD as Consulting Physician (Oncology)  CHIEF COMPLAINT: Stage IIIC ovarian cancer.  INTERVAL HISTORY: Patient returns to clinic today for further evaluation and consideration of cycle 13, day 1 of single agent gemcitabine.  She continues to have increased weakness and fatigue and complains of a poor memory.  She has noticed increased bloating and discomfort of her abdomen over this past week.  She continues to have occasional back and abdominal pain which is well controlled with tramadol which she takes only at night.  She has intermittent constipation.  She has no neurologic complaints.  She has a fair appetite and her weight has remained stable.  She denies any chest pain, shortness of breath, cough, or hemoptysis.  She does not complain of black tarry stools today.  She has no urinary complaints.  Patient offers no further specific today.  REVIEW OF SYSTEMS:   Review of Systems  Constitutional: Positive for malaise/fatigue. Negative for fever and weight loss.  Respiratory: Negative.  Negative for cough and shortness of breath.   Cardiovascular: Negative.  Negative for chest pain and leg swelling.  Gastrointestinal: Positive for abdominal pain. Negative for blood in stool, constipation, diarrhea, heartburn, melena, nausea and vomiting.  Genitourinary: Negative.  Negative for dysuria.  Musculoskeletal: Positive for back pain. Negative for neck pain.  Skin: Negative.  Negative for itching and rash.  Neurological: Positive for weakness. Negative for dizziness and focal weakness.  Endo/Heme/Allergies: Does not bruise/bleed easily.   Psychiatric/Behavioral: Positive for memory loss. The patient is not nervous/anxious.     As per HPI. Otherwise, a complete review of systems is negative.  PAST MEDICAL HISTORY: Past Medical History:  Diagnosis Date  . Anemia   . Arthritis   . Asthma   . Dyspnea   . GERD (gastroesophageal reflux disease)   . Hypertension   . Ovarian cancer ([place]) 2018    PAST SURGICAL HISTORY: Past Surgical History:  Procedure Laterality Date  . ABDOMINAL HYSTERECTOMY    . BREAST BIOPSY     x6  . BUNIONECTOMY Bilateral   . CATARACT EXTRACTION, BILATERAL    . FLEXIBLE SIGMOIDOSCOPY N/A 05/21/2017   Procedure: FLEXIBLE SIGMOIDOSCOPY;  Surgeon: Lin Landsman, MD;  Location: [facility] ENDOSCOPY;  Service: Gastroenterology;  Laterality: N/A;  . INCONTINENCE SURGERY    . PORTA CATH INSERTION N/A 06/16/2017   Procedure: PORTA CATH INSERTION;  Surgeon: Algernon Huxley, MD;  Location: [place] CV LAB;  Service: Cardiovascular;  Laterality: N/A;  . RECTAL SURGERY  04/2018  . REPAIR OF RECTAL PROLAPSE N/A 05/11/2017   Procedure: REPAIR OF RECTAL PROLAPSE;  Surgeon: Leonie Green, MD;  Location: [facility] ORS;  Service: General;  Laterality: N/A;  . TEE WITHOUT CARDIOVERSION N/A 03/22/2020   Procedure: TRANSESOPHAGEAL ECHOCARDIOGRAM (TEE);  Surgeon: Nelva Bush, MD;  Location: [facility] ORS;  Service: Cardiovascular;  Laterality: N/A;  . TONSILLECTOMY    . VAGINA SURGERY     uncertain procedure performed    FAMILY HISTORY: Family History  Problem Relation Age of Onset  . Heart disease Mother   . Heart disease Father   . Heart disease Sister   . Heart attack Sister   . Ulcerative colitis Brother   .  Lung cancer Brother   . Thyroid cancer Sister   . Asthma Sister   . Diabetes Sister   . Asthma Sister   . Pancreatic cancer Sister   . Dementia Sister   . Asthma Brother   . Heart disease Brother   . Asthma Brother   . Lung cancer Brother   . Asthma Brother   . Lung cancer Brother   . Lung  cancer Brother   . Rheum arthritis Brother     ADVANCED DIRECTIVES (Y/N):  N  HEALTH MAINTENANCE: Social History   Tobacco Use  . Smoking status: Never Smoker  . Smokeless tobacco: Never Used  Vaping Use  . Vaping Use: Never used  Substance Use Topics  . Alcohol use: No  . Drug use: No     Colonoscopy:  PAP:  Bone density:  Lipid panel:  No Known Allergies  Current Outpatient Medications  Medication Sig Dispense Refill  . acetaminophen (TYLENOL) 500 MG tablet Take 500-1,000 mg by mouth every 6 (six) hours as needed for mild pain, moderate pain or fever.     . feeding supplement, ENSURE ENLIVE, (ENSURE ENLIVE) LIQD Take 237 mLs by mouth 2 (two) times daily between meals. 237 mL 12  . iron polysaccharides (NIFEREX) 150 MG capsule Take 1 capsule (150 mg total) by mouth daily. 90 capsule 1  . lidocaine-prilocaine (EMLA) cream APPLY A SMALL AMOUNT TO PORT SITE AT LEAST 1 HOUR PRIOR TO IT BEING ACCESSED THEN COVER WITH PLASTIC WRAP 30 g 0  . meclizine (ANTIVERT) 25 MG tablet Take 25 mg by mouth 3 (three) times daily as needed for dizziness.    . montelukast (SINGULAIR) 10 MG tablet Take 10 mg by mouth at bedtime as needed (allergy symptoms).    . ondansetron (ZOFRAN) 4 MG tablet Take 1 tablet (4 mg total) by mouth every 6 (six) hours as needed for nausea. 20 tablet 0  . polyethylene glycol (MIRALAX / GLYCOLAX) packet Take 17 g by mouth daily as needed for mild constipation or moderate constipation.     . traMADol (ULTRAM) 50 MG tablet Take 1 tablet (50 mg total) by mouth every 6 (six) hours as needed. 90 tablet 1  . valsartan (DIOVAN) 160 MG tablet Take 160 mg by mouth as needed.     No current facility-administered medications for this visit.   Facility-Administered Medications Ordered in Other Visits  Medication Dose Route Frequency Provider Last Rate Last Admin  . sodium chloride flush (NS) 0.9 % injection 10 mL  10 mL Intravenous PRN Lloyd Huger, MD   10 mL at  03/10/18 1200    OBJECTIVE: Vitals:   12/05/20 1118  BP: 132/79  Pulse: 96  Resp: 18  Temp: 98.4 F (36.9 C)  SpO2: 98%     Body mass index is 16.61 kg/m.    ECOG FS:0 - Asymptomatic  General: Thin, no acute distress. Eyes: Pink conjunctiva, anicteric sclera. HEENT: Normocephalic, moist mucous membranes. Lungs: No audible wheezing or coughing. Heart: Regular rate and rhythm. Abdomen: Mildly distended. Musculoskeletal: No edema, cyanosis, or clubbing. Neuro: Alert, answering all questions appropriately. Cranial nerves grossly intact. Skin: No rashes or petechiae noted. Psych: Normal affect.  LAB RESULTS:  Lab Results  Component Value Date   NA 132 (L) 12/05/2020   K 4.2 12/05/2020   CL 97 (L) 12/05/2020   CO2 25 12/05/2020   GLUCOSE 96 12/05/2020   BUN 34 (H) 12/05/2020   CREATININE 0.77 12/05/2020   CALCIUM 8.5 (L)  12/05/2020   PROT 5.9 (L) 12/05/2020   ALBUMIN 2.9 (L) 12/05/2020   AST 54 (H) 12/05/2020   ALT 19 12/05/2020   ALKPHOS 79 12/05/2020   BILITOT 0.6 12/05/2020   GFRNONAA >60 12/05/2020   GFRAA >60 05/16/2020    Lab Results  Component Value Date   WBC 11.1 (H) 12/05/2020   NEUTROABS 9.2 (H) 12/05/2020   HGB 8.9 (L) 12/05/2020   HCT 28.1 (L) 12/05/2020   MCV 92.7 12/05/2020   PLT 550 (H) 12/05/2020   Lab Results  Component Value Date   IRON 23 (L) 10/31/2020   TIBC 288 10/31/2020   IRONPCTSAT 8 (L) 10/31/2020   Lab Results  Component Value Date   FERRITIN 152 10/31/2020     STUDIES: No results found.  ASSESSMENT: Stage IIIC ovarian cancer.  PLAN:    1. Stage IIIC ovarian cancer: Patient recently decided to discontinue treatment, but then quickly changed her mind and wishes to proceed with single agent gemcitabine.  Her most recent CA-125 has trended up to 591.0.  Proceed with cycle 13, day 1 of treatment today.  Return to clinic in 1 week for further evaluation and consideration of cycle 13, day 8.   2.  Liver abscess: Improved per  CT scan.  Appreciate infectious disease input.  Patient has now completed IV antibiotics.  No further imaging as above. 3. Lupus anticoagulant: Patient was noted to have an elevated PTT as well as increased bleeding during her surgery for rectal prolapse.   4. Anemia: Hemoglobin mildly improved to 8.9.  Multifactorial including heme positive stools secondary to GI bleed as well as chemotherapy.  Given patient's age and comorbidities, will not refer for EGD or colonoscopy at this time, but if there is a significant bleed or drop in hemoglobin may reconsider.  Patient received IV Feraheme last week, but secondary to insurance purposes this will be switched to IV Venofer.  Proceed with IV Venofer today. 5.  Constipation: Intermittent.  Continue OTC remedies as needed. 6.  Hyponatremia: Chronic and unchanged.  Patient's sodium is 132 today. 7.  Back/abdominal pain: Chronic and unchanged.  Continue tramadol as needed. 8.  Blood in stool: Patient has a history of hemorrhoids as well as rectal prolapse.  Hemoccult cards are positive, but repeat testing was negative.  9.  Weakness and fatigue: Chronic and unchanged. 10. Memory: Chronic and unchanged. 11. Reflux: Continue Nexium as needed. 12.  Abdominal bloating: Ultrasound scheduled for Friday, December 06, 2020.  Patient will also get paracentesis if needed.  Patient expressed understanding and was in agreement with this plan. She also understands that She can call clinic at any time with any questions, concerns, or complaints.   Cancer Staging Malignant neoplasm of ovary [facility]) Staging form: Ovary, Fallopian Tube, and Primary Peritoneal Carcinoma, AJCC 8th Edition - Clinical stage from 05/29/2017: Stage IIIC (cT3c, cN1b, cM0) - Signed by Lloyd Huger, MD on 05/29/2017   Lloyd Huger, MD   12/06/2020 12:29 PM

## 2020-12-05 ENCOUNTER — Encounter: Payer: Self-pay | Admitting: Oncology

## 2020-12-05 ENCOUNTER — Inpatient Hospital Stay: Payer: Medicare PPO

## 2020-12-05 ENCOUNTER — Inpatient Hospital Stay (HOSPITAL_BASED_OUTPATIENT_CLINIC_OR_DEPARTMENT_OTHER): Payer: Medicare PPO | Admitting: Oncology

## 2020-12-05 VITALS — BP 132/79 | HR 96 | Temp 98.4°F | Resp 18 | Wt 87.9 lb

## 2020-12-05 VITALS — BP 133/76 | HR 89 | Resp 18

## 2020-12-05 DIAGNOSIS — Z95828 Presence of other vascular implants and grafts: Secondary | ICD-10-CM

## 2020-12-05 DIAGNOSIS — C569 Malignant neoplasm of unspecified ovary: Secondary | ICD-10-CM | POA: Diagnosis not present

## 2020-12-05 DIAGNOSIS — D500 Iron deficiency anemia secondary to blood loss (chronic): Secondary | ICD-10-CM

## 2020-12-05 DIAGNOSIS — R188 Other ascites: Secondary | ICD-10-CM

## 2020-12-05 DIAGNOSIS — Z5111 Encounter for antineoplastic chemotherapy: Secondary | ICD-10-CM | POA: Diagnosis not present

## 2020-12-05 LAB — COMPREHENSIVE METABOLIC PANEL
ALT: 19 U/L (ref 0–44)
AST: 54 U/L — ABNORMAL HIGH (ref 15–41)
Albumin: 2.9 g/dL — ABNORMAL LOW (ref 3.5–5.0)
Alkaline Phosphatase: 79 U/L (ref 38–126)
Anion gap: 10 (ref 5–15)
BUN: 34 mg/dL — ABNORMAL HIGH (ref 8–23)
CO2: 25 mmol/L (ref 22–32)
Calcium: 8.5 mg/dL — ABNORMAL LOW (ref 8.9–10.3)
Chloride: 97 mmol/L — ABNORMAL LOW (ref 98–111)
Creatinine, Ser: 0.77 mg/dL (ref 0.44–1.00)
GFR, Estimated: >60 mL/min (ref >60)
Glucose, Bld: 96 mg/dL (ref 70–99)
Potassium: 4.2 mmol/L (ref 3.5–5.1)
Sodium: 132 mmol/L — ABNORMAL LOW (ref 135–145)
Total Bilirubin: 0.6 mg/dL (ref 0.3–1.2)
Total Protein: 5.9 g/dL — ABNORMAL LOW (ref 6.5–8.1)

## 2020-12-05 LAB — CBC WITH DIFFERENTIAL/PLATELET
Abs Immature Granulocytes: 0.08 10*3/uL — ABNORMAL HIGH (ref 0.00–0.07)
Basophils Absolute: 0 10*3/uL (ref 0.0–0.1)
Basophils Relative: 0 %
Eosinophils Absolute: 0 10*3/uL (ref 0.0–0.5)
Eosinophils Relative: 0 %
HCT: 28.1 % — ABNORMAL LOW (ref 36.0–46.0)
Hemoglobin: 8.9 g/dL — ABNORMAL LOW (ref 12.0–15.0)
Immature Granulocytes: 1 %
Lymphocytes Relative: 6 %
Lymphs Abs: 0.7 10*3/uL (ref 0.7–4.0)
MCH: 29.4 pg (ref 26.0–34.0)
MCHC: 31.7 g/dL (ref 30.0–36.0)
MCV: 92.7 fL (ref 80.0–100.0)
Monocytes Absolute: 1.1 10*3/uL — ABNORMAL HIGH (ref 0.1–1.0)
Monocytes Relative: 10 %
Neutro Abs: 9.2 10*3/uL — ABNORMAL HIGH (ref 1.7–7.7)
Neutrophils Relative %: 83 %
Platelets: 550 10*3/uL — ABNORMAL HIGH (ref 150–400)
RBC: 3.03 MIL/uL — ABNORMAL LOW (ref 3.87–5.11)
RDW: 19.1 % — ABNORMAL HIGH (ref 11.5–15.5)
WBC: 11.1 10*3/uL — ABNORMAL HIGH (ref 4.0–10.5)
nRBC: 0 % (ref 0.0–0.2)

## 2020-12-05 MED ORDER — HEPARIN SOD (PORK) LOCK FLUSH 100 UNIT/ML IV SOLN
INTRAVENOUS | Status: AC
  Filled 2020-12-05: qty 5

## 2020-12-05 MED ORDER — IRON SUCROSE 20 MG/ML IV SOLN
200.0000 mg | Freq: Once | INTRAVENOUS | Status: AC
  Administered 2020-12-05: 200 mg via INTRAVENOUS
  Filled 2020-12-05: qty 10

## 2020-12-05 MED ORDER — SODIUM CHLORIDE 0.9 % IV SOLN
1000.0000 mg | Freq: Once | INTRAVENOUS | Status: AC
  Administered 2020-12-05: 988.5932 mg via INTRAVENOUS
  Filled 2020-12-05: qty 26

## 2020-12-05 MED ORDER — SODIUM CHLORIDE 0.9% FLUSH
10.0000 mL | Freq: Once | INTRAVENOUS | Status: AC
  Administered 2020-12-05: 10 mL via INTRAVENOUS
  Filled 2020-12-05: qty 10

## 2020-12-05 MED ORDER — PROCHLORPERAZINE MALEATE 10 MG PO TABS
10.0000 mg | ORAL_TABLET | Freq: Once | ORAL | Status: AC
  Administered 2020-12-05: 10 mg via ORAL
  Filled 2020-12-05: qty 1

## 2020-12-05 MED ORDER — SODIUM CHLORIDE 0.9 % IV SOLN
Freq: Once | INTRAVENOUS | Status: AC
  Filled 2020-12-05: qty 250

## 2020-12-05 MED ORDER — HEPARIN SOD (PORK) LOCK FLUSH 100 UNIT/ML IV SOLN
500.0000 [IU] | Freq: Once | INTRAVENOUS | Status: AC
  Administered 2020-12-05: 500 [IU] via INTRAVENOUS
  Filled 2020-12-05: qty 5

## 2020-12-05 MED ORDER — SODIUM CHLORIDE 0.9 % IV SOLN: 200.0000 mg | Freq: Once | INTRAVENOUS | Status: DC

## 2020-12-05 NOTE — Progress Notes (Signed)
Pt in for follow up, reports swelling in abdomen and pain.

## 2020-12-06 ENCOUNTER — Other Ambulatory Visit: Payer: Self-pay | Admitting: Oncology

## 2020-12-06 ENCOUNTER — Ambulatory Visit
Admission: RE | Admit: 2020-12-06 | Discharge: 2020-12-06 | Disposition: A | Discharge status: Home / Self Care | Payer: Medicare PPO | Source: Ambulatory Visit | Attending: Oncology | Admitting: Oncology

## 2020-12-06 ENCOUNTER — Other Ambulatory Visit: Payer: Self-pay

## 2020-12-06 DIAGNOSIS — R188 Other ascites: Secondary | ICD-10-CM

## 2020-12-06 LAB — CA 125: Cancer Antigen (CA) 125: 591 U/mL — ABNORMAL HIGH (ref 0.0–38.1)

## 2020-12-06 NOTE — Progress Notes (Signed)
Interventional Radiology Brief Note  Patient presents for possible paracentesis. Limited US Abdomen shows a large cystic mass/lesion in the left abdomen. There is no free fluid or ascites.  No paracentesis performed.  Discussed with patient and son.   Brynda Greathouse, MS RD PA-C

## 2020-12-08 NOTE — Progress Notes (Signed)
[facility]  [phone] [phone]  ID: Tamara Mckinney OB: December 09, 1927  MR#: [id number]  KDX#:[id number]  Patient Care Team: [name], MD as PCP - General (Family Medicine) [name], RN as Registered Nurse Herbert Pun, MD as Consulting Physician (General Surgery) Lloyd Huger, MD as Consulting Physician (Oncology)  CHIEF COMPLAINT: Stage IIIC ovarian cancer.  INTERVAL HISTORY: Patient returns to clinic today for further evaluation and consideration of cycle 13, day 8 of single agent gemcitabine.  She continues to have increased weakness and fatigue and a poor memory. She continues to have bloating and discomfort of her abdomen, but ultrasound was negative for ascites.  She has occasional back which is well controlled with tramadol which she takes only at night.  She has intermittent constipation.  She has no neurologic complaints.  She has a fair appetite and her weight has remained stable.  She denies any chest pain, shortness of breath, cough, or hemoptysis.  She does not complain of black tarry stools today.  She has no urinary complaints.  Patient offers no further specific complaints today.  REVIEW OF SYSTEMS:   Review of Systems  Constitutional: Positive for malaise/fatigue. Negative for fever and weight loss.  Respiratory: Negative.  Negative for cough and shortness of breath.   Cardiovascular: Negative.  Negative for chest pain and leg swelling.  Gastrointestinal: Positive for abdominal pain. Negative for blood in stool, constipation, diarrhea, heartburn, melena, nausea and vomiting.  Genitourinary: Negative.  Negative for dysuria.  Musculoskeletal: Positive for back pain. Negative for neck pain.  Skin: Negative.  Negative for itching and rash.  Neurological: Positive for weakness. Negative for dizziness and focal weakness.  Endo/Heme/Allergies: Does not bruise/bleed easily.  Psychiatric/Behavioral: Positive  for memory loss. The patient is not nervous/anxious.     As per HPI. Otherwise, a complete review of systems is negative.  PAST MEDICAL HISTORY: Past Medical History:  Diagnosis Date  . Anemia   . Arthritis   . Asthma   . Dyspnea   . GERD (gastroesophageal reflux disease)   . Hypertension   . Ovarian cancer ([place]) 2018    PAST SURGICAL HISTORY: Past Surgical History:  Procedure Laterality Date  . ABDOMINAL HYSTERECTOMY    . BREAST BIOPSY     x6  . BUNIONECTOMY Bilateral   . CATARACT EXTRACTION, BILATERAL    . FLEXIBLE SIGMOIDOSCOPY N/A 05/21/2017   Procedure: FLEXIBLE SIGMOIDOSCOPY;  Surgeon: Lin Landsman, MD;  Location: [facility] ENDOSCOPY;  Service: Gastroenterology;  Laterality: N/A;  . INCONTINENCE SURGERY    . PORTA CATH INSERTION N/A 06/16/2017   Procedure: PORTA CATH INSERTION;  Surgeon: Algernon Huxley, MD;  Location: [facility] LAB;  Service: Cardiovascular;  Laterality: N/A;  . RECTAL SURGERY  04/2018  . REPAIR OF RECTAL PROLAPSE N/A 05/11/2017   Procedure: REPAIR OF RECTAL PROLAPSE;  Surgeon: Leonie Green, MD;  Location: [facility] ORS;  Service: General;  Laterality: N/A;  . TEE WITHOUT CARDIOVERSION N/A 03/22/2020   Procedure: TRANSESOPHAGEAL ECHOCARDIOGRAM (TEE);  Surgeon: Nelva Bush, MD;  Location: [facility] ORS;  Service: Cardiovascular;  Laterality: N/A;  . TONSILLECTOMY    . VAGINA SURGERY     uncertain procedure performed    FAMILY HISTORY: Family History  Problem Relation Age of Onset  . Heart disease Mother   . Heart disease Father   . Heart disease Sister   . Heart attack Sister   . Ulcerative colitis Brother   . Lung cancer Brother   .  Thyroid cancer Sister   . Asthma Sister   . Diabetes Sister   . Asthma Sister   . Pancreatic cancer Sister   . Dementia Sister   . Asthma Brother   . Heart disease Brother   . Asthma Brother   . Lung cancer Brother   . Asthma Brother   . Lung cancer Brother   . Lung cancer Brother   . Rheum  arthritis Brother     ADVANCED DIRECTIVES (Y/N):  N  HEALTH MAINTENANCE: Social History   Tobacco Use  . Smoking status: Never Smoker  . Smokeless tobacco: Never Used  Vaping Use  . Vaping Use: Never used  Substance Use Topics  . Alcohol use: No  . Drug use: No     Colonoscopy:  PAP:  Bone density:  Lipid panel:  No Known Allergies  Current Outpatient Medications  Medication Sig Dispense Refill  . acetaminophen (TYLENOL) 500 MG tablet Take 500-1,000 mg by mouth every 6 (six) hours as needed for mild pain, moderate pain or fever.     . feeding supplement, ENSURE ENLIVE, (ENSURE ENLIVE) LIQD Take 237 mLs by mouth 2 (two) times daily between meals. 237 mL 12  . iron polysaccharides (NIFEREX) 150 MG capsule Take 1 capsule (150 mg total) by mouth daily. 90 capsule 1  . lidocaine-prilocaine (EMLA) cream APPLY A SMALL AMOUNT TO PORT SITE AT LEAST 1 HOUR PRIOR TO IT BEING ACCESSED THEN COVER WITH PLASTIC WRAP 30 g 0  . meclizine (ANTIVERT) 25 MG tablet Take 25 mg by mouth 3 (three) times daily as needed for dizziness.    . montelukast (SINGULAIR) 10 MG tablet Take 10 mg by mouth at bedtime as needed (allergy symptoms).    . ondansetron (ZOFRAN) 4 MG tablet Take 1 tablet (4 mg total) by mouth every 6 (six) hours as needed for nausea. 20 tablet 0  . polyethylene glycol (MIRALAX / GLYCOLAX) packet Take 17 g by mouth daily as needed for mild constipation or moderate constipation.     . traMADol (ULTRAM) 50 MG tablet Take 1 tablet (50 mg total) by mouth every 6 (six) hours as needed. 90 tablet 1  . valsartan (DIOVAN) 160 MG tablet Take 160 mg by mouth as needed.     No current facility-administered medications for this visit.   Facility-Administered Medications Ordered in Other Visits  Medication Dose Route Frequency Provider Last Rate Last Admin  . heparin lock flush 100 unit/mL  500 Units Intravenous Once Lloyd Huger, MD      . sodium chloride flush (NS) 0.9 % injection 10 mL   10 mL Intravenous PRN Lloyd Huger, MD   10 mL at 03/10/18 1200    OBJECTIVE: Vitals:   12/12/20 1142  BP: (!) 135/92  Pulse: (!) 105  Resp: 18  Temp: (!) 97.2 F (36.2 C)  SpO2: 93%     Body mass index is 16.65 kg/m.    ECOG FS:0 - Asymptomatic  General: Thin, no acute distress. Eyes: Pink conjunctiva, anicteric sclera. HEENT: Normocephalic, moist mucous membranes. Lungs: No audible wheezing or coughing. Heart: Regular rate and rhythm. Abdomen: Soft, nontender, no obvious distention. Musculoskeletal: No edema, cyanosis, or clubbing. Neuro: Alert, answering all questions appropriately. Cranial nerves grossly intact. Skin: No rashes or petechiae noted. Psych: Normal affect.  LAB RESULTS:  Lab Results  Component Value Date   NA 133 (L) 12/12/2020   K 3.9 12/12/2020   CL 97 (L) 12/12/2020   CO2 26 12/12/2020  GLUCOSE 128 (H) 12/12/2020   BUN 30 (H) 12/12/2020   CREATININE 0.78 12/12/2020   CALCIUM 8.6 (L) 12/12/2020   PROT 6.0 (L) 12/12/2020   ALBUMIN 3.0 (L) 12/12/2020   AST 40 12/12/2020   ALT 21 12/12/2020   ALKPHOS 80 12/12/2020   BILITOT 0.5 12/12/2020   GFRNONAA >60 12/12/2020   GFRAA >60 05/16/2020    Lab Results  Component Value Date   WBC 8.9 12/12/2020   NEUTROABS 7.4 12/12/2020   HGB 9.7 (L) 12/12/2020   HCT 30.8 (L) 12/12/2020   MCV 93.9 12/12/2020   PLT 448 (H) 12/12/2020   Lab Results  Component Value Date   IRON 23 (L) 10/31/2020   TIBC 288 10/31/2020   IRONPCTSAT 8 (L) 10/31/2020   Lab Results  Component Value Date   FERRITIN 152 10/31/2020     STUDIES: US Abdomen Limited  Result Date: 12/06/2020 CLINICAL DATA:  History of ovarian carcinoma. Bloating and abdominal discomfort. EXAM: LIMITED ABDOMEN ULTRASOUND FOR ASCITES TECHNIQUE: Limited ultrasound survey for ascites was performed in all four abdominal quadrants. COMPARISON:  CT of the abdomen and pelvis on 05/06/2020 FINDINGS: Only a trace amount of free fluid is  identified in the abdomen. There appears to be a large, complex cystic mass in the right abdomen. This would correlate with previous CT findings. No paracentesis was performed. IMPRESSION: Trace ascites with not enough free fluid present to allow for paracentesis today. Electronically Signed   By: Aletta Edouard M.D.   On: 12/06/2020 13:10    ASSESSMENT: Stage IIIC ovarian cancer.  PLAN:    1. Stage IIIC ovarian cancer: Patient recently decided to discontinue treatment, but then quickly changed her mind and wishes to proceed with single agent gemcitabine.  Her most recent CA-125 has trended up to 591.0.  Today's result is pending.  Proceed with cycle 13, day 8 of treatment today.  Patient wishes to take time off to decide whether or not she wishes to continue treatment.  Return to clinic in 3 weeks for further evaluation and gemcitabine if desired.  This will be considered cycle 14, day 1.   2.  Liver abscess: Improved per CT scan.  Appreciate infectious disease input.  Patient has now completed IV antibiotics.  No further imaging as above. 3. Lupus anticoagulant: Patient was noted to have an elevated PTT as well as increased bleeding during her surgery for rectal prolapse.   4. Anemia: Hemoglobin improved to 9.7.  Multifactorial including heme positive stools secondary to GI bleed as well as chemotherapy.  Given patient's age and comorbidities, will not refer for EGD or colonoscopy at this time, but if there is a significant bleed or drop in hemoglobin may reconsider.  Patient last received IV Venofer on December 05, 2020.   5.  Constipation: Intermittent.  Continue OTC remedies as needed. 6.  Hyponatremia: Chronic and unchanged.  Patient sodium is 133 today. 7.  Back/abdominal pain: Chronic and unchanged.  Continue tramadol as needed. 8.  Blood in stool: Patient has a history of hemorrhoids as well as rectal prolapse.  Hemoccult cards are positive, but repeat testing was negative.  9.  Weakness and  fatigue: Chronic and unchanged. 10. Memory: Chronic and unchanged. 11. Reflux: Continue Nexium as needed. 12.  Abdominal bloating: Ultrasound on Friday, December 06, 2020 did not reveal any ascites.  Patient expressed understanding and was in agreement with this plan. She also understands that She can call clinic at any time with any questions, concerns, or  complaints.   Cancer Staging Malignant neoplasm of ovary [facility]) Staging form: Ovary, Fallopian Tube, and Primary Peritoneal Carcinoma, AJCC 8th Edition - Clinical stage from 05/29/2017: Stage IIIC (cT3c, cN1b, cM0) - Signed by Lloyd Huger, MD on 05/29/2017   Lloyd Huger, MD   12/12/2020 12:19 PM

## 2020-12-12 ENCOUNTER — Inpatient Hospital Stay: Payer: Medicare PPO

## 2020-12-12 ENCOUNTER — Other Ambulatory Visit: Payer: Self-pay

## 2020-12-12 ENCOUNTER — Inpatient Hospital Stay (HOSPITAL_BASED_OUTPATIENT_CLINIC_OR_DEPARTMENT_OTHER): Payer: Medicare PPO | Admitting: Oncology

## 2020-12-12 ENCOUNTER — Encounter: Payer: Self-pay | Admitting: Oncology

## 2020-12-12 VITALS — BP 135/92 | HR 105 | Temp 97.2°F | Resp 18 | Ht 61.0 in | Wt 88.1 lb

## 2020-12-12 DIAGNOSIS — C569 Malignant neoplasm of unspecified ovary: Secondary | ICD-10-CM

## 2020-12-12 DIAGNOSIS — Z5111 Encounter for antineoplastic chemotherapy: Secondary | ICD-10-CM | POA: Diagnosis not present

## 2020-12-12 LAB — COMPREHENSIVE METABOLIC PANEL
ALT: 21 U/L (ref 0–44)
AST: 40 U/L (ref 15–41)
Albumin: 3 g/dL — ABNORMAL LOW (ref 3.5–5.0)
Alkaline Phosphatase: 80 U/L (ref 38–126)
Anion gap: 10 (ref 5–15)
BUN: 30 mg/dL — ABNORMAL HIGH (ref 8–23)
CO2: 26 mmol/L (ref 22–32)
Calcium: 8.6 mg/dL — ABNORMAL LOW (ref 8.9–10.3)
Chloride: 97 mmol/L — ABNORMAL LOW (ref 98–111)
Creatinine, Ser: 0.78 mg/dL (ref 0.44–1.00)
GFR, Estimated: >60 mL/min (ref >60)
Glucose, Bld: 128 mg/dL — ABNORMAL HIGH (ref 70–99)
Potassium: 3.9 mmol/L (ref 3.5–5.1)
Sodium: 133 mmol/L — ABNORMAL LOW (ref 135–145)
Total Bilirubin: 0.5 mg/dL (ref 0.3–1.2)
Total Protein: 6 g/dL — ABNORMAL LOW (ref 6.5–8.1)

## 2020-12-12 LAB — CBC WITH DIFFERENTIAL/PLATELET
Abs Immature Granulocytes: 0.06 10*3/uL (ref 0.00–0.07)
Basophils Absolute: 0 10*3/uL (ref 0.0–0.1)
Basophils Relative: 0 %
Eosinophils Absolute: 0 10*3/uL (ref 0.0–0.5)
Eosinophils Relative: 0 %
HCT: 30.8 % — ABNORMAL LOW (ref 36.0–46.0)
Hemoglobin: 9.7 g/dL — ABNORMAL LOW (ref 12.0–15.0)
Immature Granulocytes: 1 %
Lymphocytes Relative: 6 %
Lymphs Abs: 0.6 10*3/uL — ABNORMAL LOW (ref 0.7–4.0)
MCH: 29.6 pg (ref 26.0–34.0)
MCHC: 31.5 g/dL (ref 30.0–36.0)
MCV: 93.9 fL (ref 80.0–100.0)
Monocytes Absolute: 0.9 10*3/uL (ref 0.1–1.0)
Monocytes Relative: 10 %
Neutro Abs: 7.4 10*3/uL (ref 1.7–7.7)
Neutrophils Relative %: 83 %
Platelets: 448 10*3/uL — ABNORMAL HIGH (ref 150–400)
RBC: 3.28 MIL/uL — ABNORMAL LOW (ref 3.87–5.11)
RDW: 17.9 % — ABNORMAL HIGH (ref 11.5–15.5)
WBC: 8.9 10*3/uL (ref 4.0–10.5)
nRBC: 0 % (ref 0.0–0.2)

## 2020-12-12 MED ORDER — SODIUM CHLORIDE 0.9% FLUSH
10.0000 mL | Freq: Once | INTRAVENOUS | Status: AC
  Administered 2020-12-12: 10 mL via INTRAVENOUS
  Filled 2020-12-12: qty 10

## 2020-12-12 MED ORDER — SODIUM CHLORIDE 0.9 % IV SOLN
1000.0000 mg | Freq: Once | INTRAVENOUS | Status: AC
  Administered 2020-12-12: 988.5932 mg via INTRAVENOUS
  Filled 2020-12-12: qty 26

## 2020-12-12 MED ORDER — HEPARIN SOD (PORK) LOCK FLUSH 100 UNIT/ML IV SOLN
INTRAVENOUS | Status: AC
  Filled 2020-12-12: qty 5

## 2020-12-12 MED ORDER — PROCHLORPERAZINE MALEATE 10 MG PO TABS
10.0000 mg | ORAL_TABLET | Freq: Once | ORAL | Status: AC
Start: 2020-12-12 — End: 2020-12-12
  Administered 2020-12-12: 10 mg via ORAL
  Filled 2020-12-12: qty 1

## 2020-12-12 MED ORDER — SODIUM CHLORIDE 0.9 % IV SOLN
Freq: Once | INTRAVENOUS | Status: AC
  Filled 2020-12-12: qty 250

## 2020-12-12 MED ORDER — HEPARIN SOD (PORK) LOCK FLUSH 100 UNIT/ML IV SOLN
500.0000 [IU] | Freq: Once | INTRAVENOUS | Status: DC | PRN
  Filled 2020-12-12: qty 5

## 2020-12-12 MED ORDER — HEPARIN SOD (PORK) LOCK FLUSH 100 UNIT/ML IV SOLN
500.0000 [IU] | Freq: Once | INTRAVENOUS | Status: AC
  Administered 2020-12-12: 500 [IU] via INTRAVENOUS
  Filled 2020-12-12: qty 5

## 2020-12-12 NOTE — Progress Notes (Signed)
HR 105, ok to proceed with Gemzar per Dr. Grayland Ormond.

## 2020-12-12 NOTE — Patient Instructions (Signed)
[facility]  Discharge Instructions: Thank you for choosing [facility] to provide your oncology and hematology care.  If you have a lab appointment with the Cancer Center, please go directly to the Cancer Center and check in at the registration area.  Wear comfortable clothing and clothing appropriate for easy access to any Portacath or PICC line.   We strive to give you quality time with your provider. You may need to reschedule your appointment if you arrive late (15 or more minutes).  Arriving late affects you and other patients whose appointments are after yours.  Also, if you miss three or more appointments without notifying the office, you may be dismissed from the clinic at the provider's discretion.      For prescription refill requests, have your pharmacy contact our office and allow 72 hours for refills to be completed.    Today you received the following chemotherapy and/or immunotherapy agents Gemzar   To help prevent nausea and vomiting after your treatment, we encourage you to take your nausea medication as directed.  BELOW ARE SYMPTOMS THAT SHOULD BE REPORTED IMMEDIATELY: *FEVER GREATER THAN 100.4 F (38 C) OR HIGHER *CHILLS OR SWEATING *NAUSEA AND VOMITING THAT IS NOT CONTROLLED WITH YOUR NAUSEA MEDICATION *UNUSUAL SHORTNESS OF BREATH *UNUSUAL BRUISING OR BLEEDING *URINARY PROBLEMS (pain or burning when urinating, or frequent urination) *BOWEL PROBLEMS (unusual diarrhea, constipation, pain near the anus) TENDERNESS IN MOUTH AND THROAT WITH OR WITHOUT PRESENCE OF ULCERS (sore throat, sores in mouth, or a toothache) UNUSUAL RASH, SWELLING OR PAIN  UNUSUAL VAGINAL DISCHARGE OR ITCHING   Items with * indicate a potential emergency and should be followed up as soon as possible or go to the Emergency Department if any problems should occur.  Please show the CHEMOTHERAPY ALERT CARD or IMMUNOTHERAPY ALERT CARD at check-in to the  Emergency Department and triage nurse.  Should you have questions after your visit or need to cancel or reschedule your appointment, please contact [facility]  [phone] and follow the prompts.  Office hours are 8:00 a.m. to 4:30 p.m. Monday - Friday. Please note that voicemails left after 4:00 p.m. may not be returned until the following business day.  We are closed weekends and major holidays. You have access to a nurse at all times for urgent questions. Please call the main number to the clinic [phone] and follow the prompts.  For any non-urgent questions, you may also contact your provider using MyChart. We now offer e-Visits for anyone 18 and older to request care online for non-urgent symptoms. For details visit [email or web address].   Also download the MyChart app! Go to the app store, search "MyChart", open the app, select [facility], and log in with your MyChart username and password.  Due to Covid, a mask is required upon entering the hospital/clinic. If you do not have a mask, one will be given to you upon arrival. For doctor visits, patients may have 1 support person aged 18 or older with them. For treatment visits, patients cannot have anyone with them due to current Covid guidelines and our immunocompromised population.  

## 2020-12-13 LAB — CA 125: Cancer Antigen (CA) 125: 394 U/mL — ABNORMAL HIGH (ref 0.0–38.1)

## 2020-12-17 IMAGING — CT CT ABDOMEN AND PELVIS WITH CONTRAST
2 of 5 series · 16 of 46 positions shown, 18 images · IV contrast (APPLIED)
Comparison: 05/06/2018

CLINICAL DATA: Ovarian cancer.

EXAM:
CT ABDOMEN AND PELVIS WITH CONTRAST
TECHNIQUE: Multidetector CT imaging of the abdomen and pelvis was performed
using the standard protocol following bolus administration of
intravenous contrast.
CONTRAST:  75mL OMNIPAQUE IOHEXOL 300 MG/ML  SOLN

[Series 2: routine abd/pel with · axial · 0.69mm/px · z∈[-803,-498]mm · 13 of 69 slices shown, 15 images]
[im 4/69  soft-tissue]
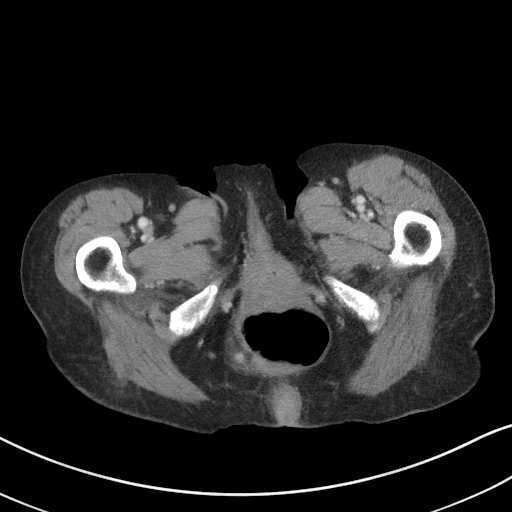
[im 4/69  bone]
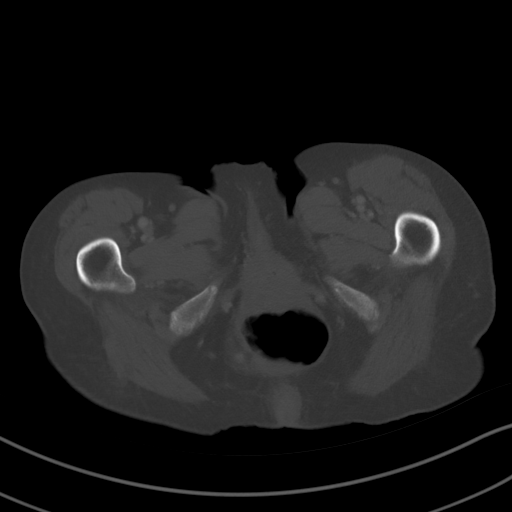
[im 11/69  soft-tissue]
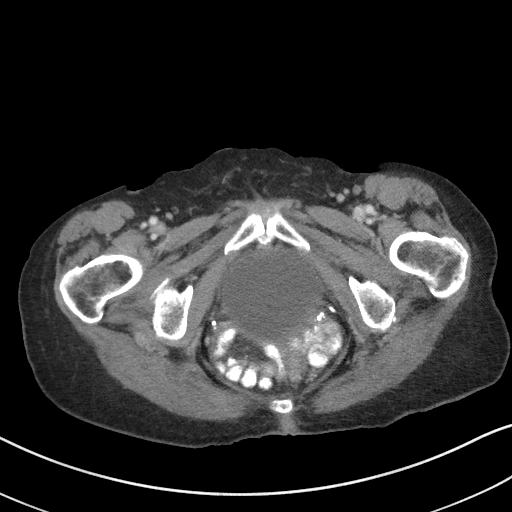
[im 15/69  soft-tissue]
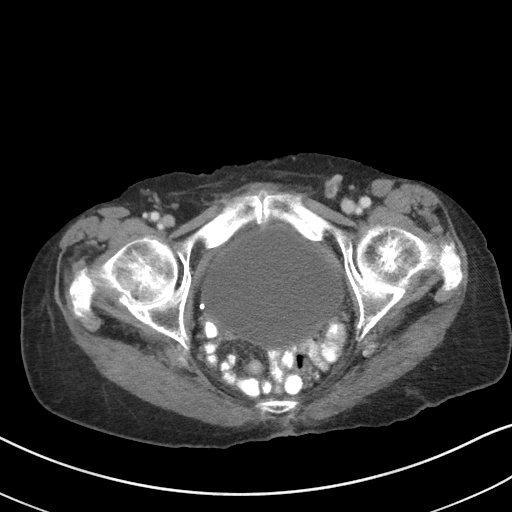
[im 18/69  soft-tissue]
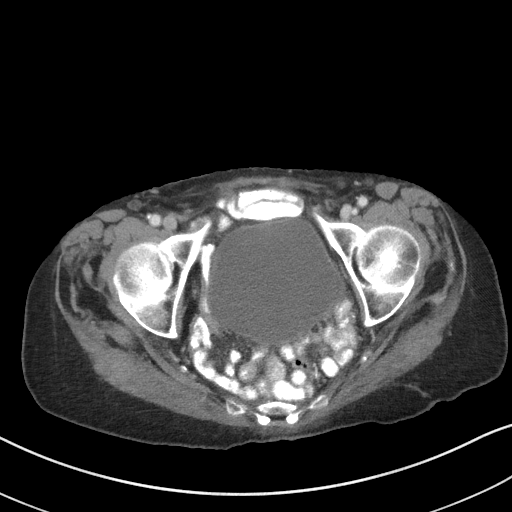
[im 26/69  soft-tissue]
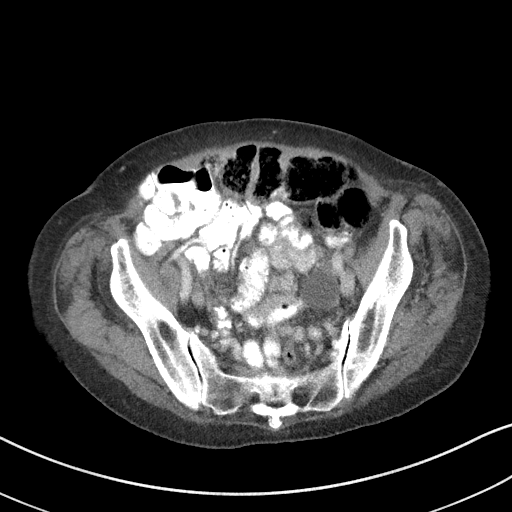
[im 29/69  soft-tissue]
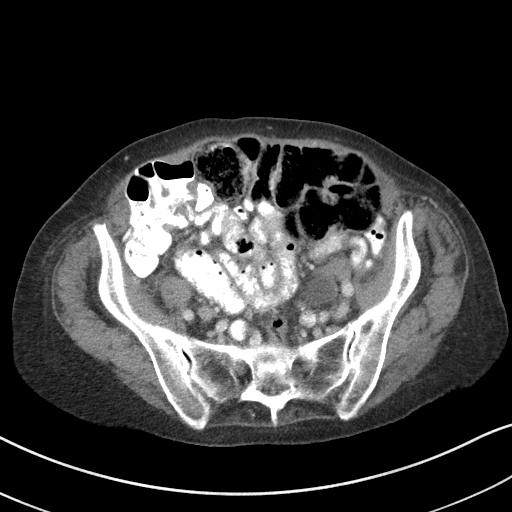
[im 36/69  soft-tissue]
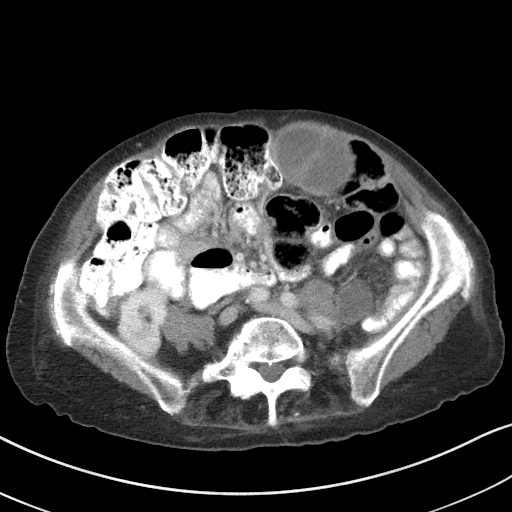
[im 40/69  soft-tissue]
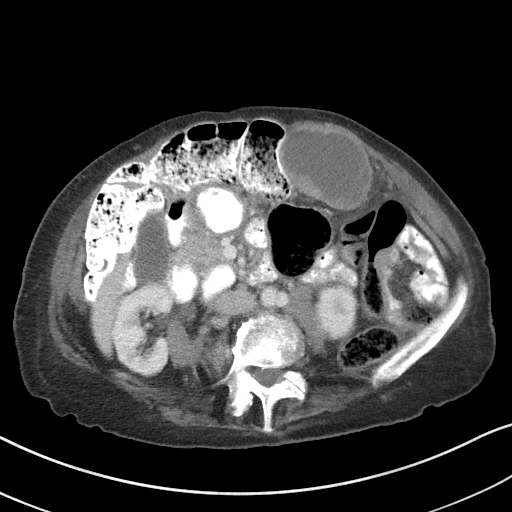
[im 43/69  soft-tissue]
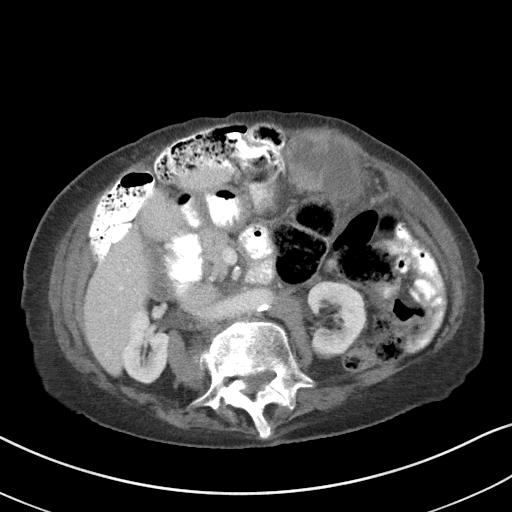
[im 43/69  bone]
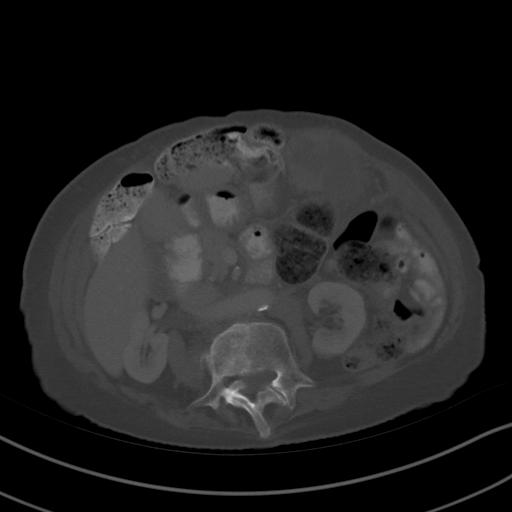
[im 51/69  soft-tissue]
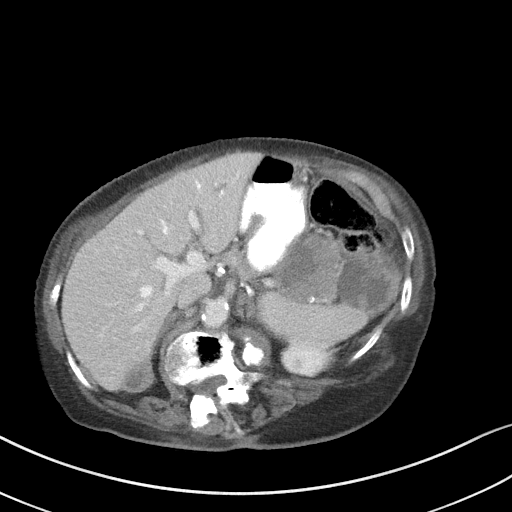
[im 54/69  soft-tissue]
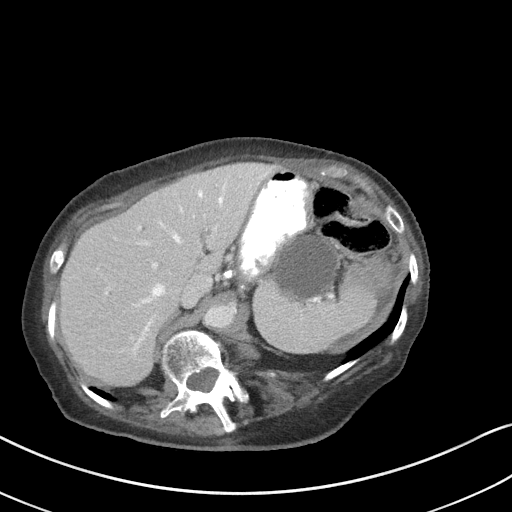
[im 58/69  soft-tissue]
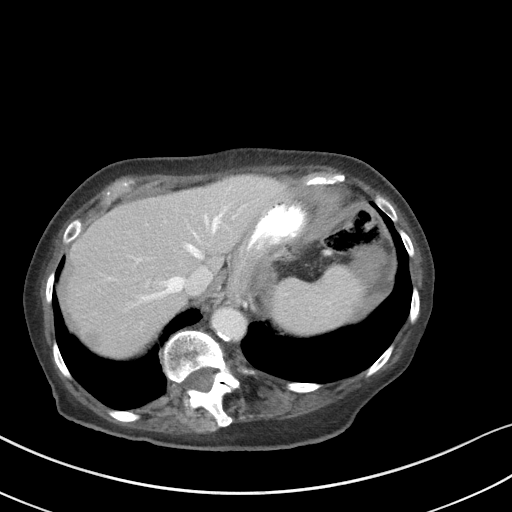
[im 65/69  soft-tissue]
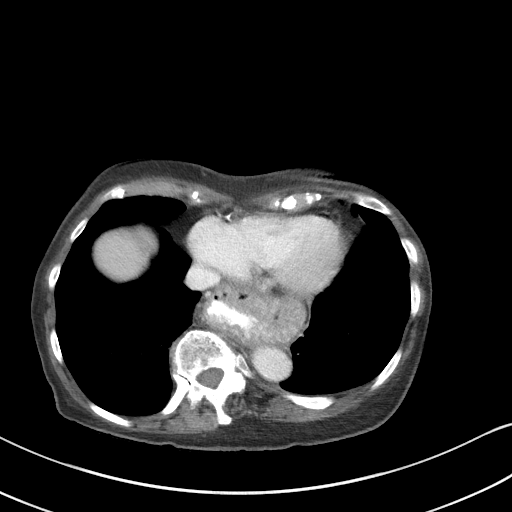

[Series 5: coronal st · coronal · 0.68mm/px · 3 of 81 slices shown]
[im 27/81  soft-tissue]
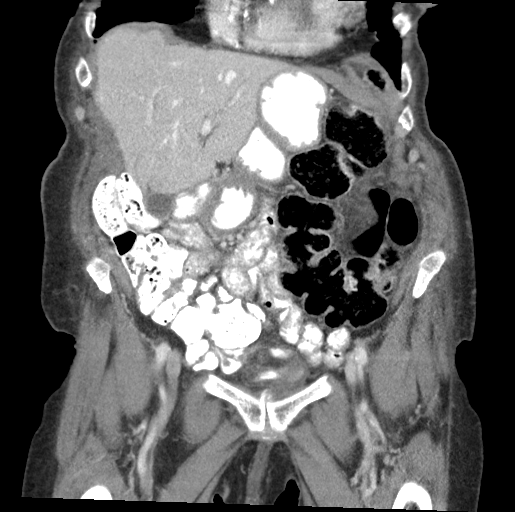
[im 36/81  soft-tissue]
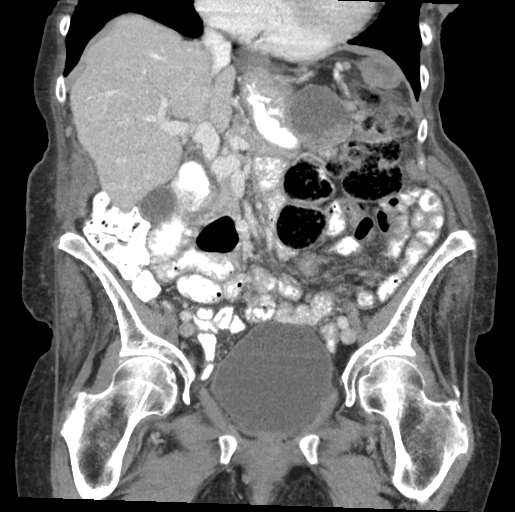
[im 45/81  soft-tissue]
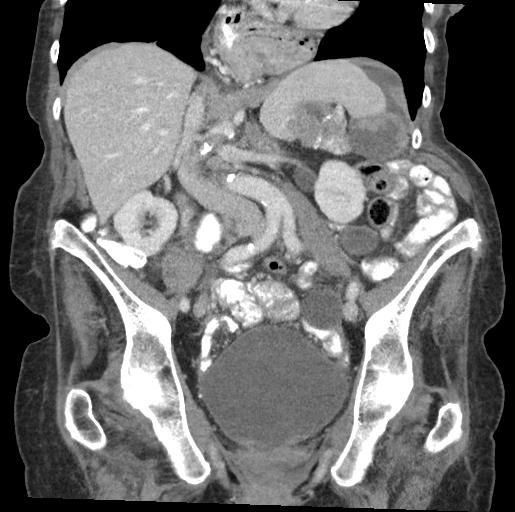

[16 of 46 positions shown; findings below may reference images not displayed]

FINDINGS: Lower chest: Moderate to large hiatal hernia. Tiny right lower lobe
pulmonary nodule (image 6/series 4) is unchanged.

Hepatobiliary: No suspicious focal abnormality within the liver
parenchyma. 2.8 x 2.1 cm lesion on the capsule of the posteromedial
right liver ([DATE]) is stable since prior when it measured 2.9 x
cm. There is no evidence for gallstones, gallbladder wall
thickening, or pericholecystic fluid. No intrahepatic or
extrahepatic biliary dilation.

Pancreas: No focal mass lesion. No dilatation of the main duct. No
intraparenchymal cyst. No peripancreatic edema.

Spleen: Calcified granulomata noted in the spleen.

Adrenals/Urinary Tract: No adrenal nodule or mass. Kidneys
unremarkable. No evidence for hydroureter. The urinary bladder
appears normal for the degree of distention.

Stomach/Bowel: Moderate to large hiatal hernia. Stomach otherwise
unremarkable. Duodenum is normally positioned as is the ligament of
Treitz. No small bowel wall thickening. No small bowel dilatation.
The appendix is normal. No gross colonic mass. No colonic wall
thickening.

Vascular/Lymphatic: There is abdominal aortic atherosclerosis
without aneurysm. There is no gastrohepatic or hepatoduodenal
ligament lymphadenopathy. No intraperitoneal or retroperitoneal
lymphadenopathy. No pelvic sidewall lymphadenopathy.

Reproductive: Uterus surgically absent. Stable appearance 2.6 cm
homogeneous cystic lesion in the left adnexal space.

Other: 5.0 x 3.0 cm left omental mass identified as new on the
previous exam has progressed in the interval measuring 5.2 x 7.3 cm
today ([DATE]).

Cystic and solid mass in the splenic hilum measures 4.7 x 4.4 cm
today compared to 2.6 x 2.4 cm previously.

Another lesion anterior to the spleen measures 4.2 x 3.8 cm on image
19/series 2 which compares to 1.5 x 1.2 cm previously.

3.2 x 2.5 cm lesion in the left mesentery (33/2) was 1.4 x 1.3 cm
previously.

Musculoskeletal: No worrisome lytic or sclerotic osseous
abnormality. Thoracolumbar scoliosis evident. Changes of pelvic
floor laxity evident.
IMPRESSION: 1. Interval relatively marked progression of the omental and
peritoneal metastases, as above.
2. Moderate to large hiatal hernia.
3. Stable tiny right lower lobe pulmonary nodule.

## 2020-12-19 ENCOUNTER — Telehealth: Payer: Self-pay

## 2020-12-19 NOTE — Telephone Encounter (Signed)
Nutrition  RD called son, Tamara Mckinney for nutrition follow-up regarding patient.  Left message with call back number.  Tamara Mckinney, [place], [place] Registered Dietitian [phone] (mobile)

## 2020-12-24 ENCOUNTER — Telehealth: Payer: Self-pay

## 2020-12-24 NOTE — Telephone Encounter (Signed)
Nutrition Follow-up:  Return phone call received from Cathie Olden, son of patient.   Patient with ovarian cancer and receiving gemcitabine.   Son reports that patient has declined recently.  Eating very little and having increased pain.  My brother and I have to put her in bed at night.  "I am interested in hospice."  "I know her cancer is progressing."     NUTRITION DIAGNOSIS: Inadequate oral intake continues   INTERVENTION:  Patient interested in RD speaking with Dr Grayland Ormond.  In basket message sent regarding son's interest in Palliative Care/Hospice services.   RD available as needed Son appreciative.   NEXT VISIT: no follow-up   Elizabeth Paulsen B. Zenia Resides, [place], [place] Registered Dietitian [phone] (mobile)

## 2020-12-25 ENCOUNTER — Inpatient Hospital Stay: Payer: Medicare PPO | Attending: Hospice and Palliative Medicine | Admitting: Hospice and Palliative Medicine

## 2020-12-25 DIAGNOSIS — Z515 Encounter for palliative care: Secondary | ICD-10-CM | POA: Diagnosis not present

## 2020-12-25 DIAGNOSIS — C569 Malignant neoplasm of unspecified ovary: Secondary | ICD-10-CM

## 2020-12-25 NOTE — Progress Notes (Signed)
Virtual Visit via Telephone Note  I connected with Tamara Mckinney on 12/25/20 at  2:00 PM EDT by telephone and verified that I am speaking with the correct person using two identifiers.  Location: Patient: Home Provider: Home   I discussed the limitations, risks, security and privacy concerns of performing an evaluation and management service by telephone and the availability of in person appointments. I also discussed with the patient that there may be a patient responsible charge related to this service. The patient expressed understanding and agreed to proceed.   History of Present Illness: Ms. Tamara Mckinney is a 87 year old woman with multiple medical problems including stage IIIc ovarian cancer who has been previously treated with single agent gemcitabine.  She has had progressive weakness and fatigue. She has also had worsening confusion.  Patient had previously opted to discontinue chemotherapy but then later changed her mind.  Her last chemotherapy was on 4/28.  Overall, patient has been declining with poor oral intake and increased pain and son requested palliative care referral to discuss option of hospice.   Observations/Objective: I called and spoke with son, Geoffery Spruce. He reports that patient is declining rapidly. She is sleeping >20 hours per day with minimal oral intake. Son reports that family is interested in keeping patient home and comfortable until the end of her life. He asked about hospice, which we discussed in detail. He was in agreement with me sending a hospice order.   Assessment and Plan: Stage IIIC ovarian cancer - declining. Likely approaching end of life. Order called in to [facility] hospice for services at home.   Follow Up Instructions: As needed   I discussed the assessment and treatment plan with the patient. The patient was provided an opportunity to ask questions and all were answered. The patient agreed with the plan and demonstrated an understanding of the  instructions.   The patient was advised to call back or seek an in-person evaluation if the symptoms worsen or if the condition fails to improve as anticipated.  I provided 15 minutes of non-face-to-face time during this encounter.   Irean Hong, NP

## 2020-12-26 ENCOUNTER — Telehealth: Payer: Self-pay | Admitting: *Deleted

## 2020-12-26 ENCOUNTER — Ambulatory Visit: Payer: Medicare PPO | Admitting: Oncology

## 2020-12-26 ENCOUNTER — Ambulatory Visit: Payer: Medicare PPO

## 2020-12-26 ENCOUNTER — Other Ambulatory Visit: Payer: Medicare PPO

## 2020-12-26 NOTE — Telephone Encounter (Signed)
vm msg left from patient's husband. He would like to discuss the apt on Dr. Gary Fleet schedule next Thursdays. Is this apt necessary given that patient is under hospice care.

## 2020-12-27 NOTE — Telephone Encounter (Signed)
Family is aware that appts are not necessary at this time. All future appts have been cancelled.

## 2021-01-02 ENCOUNTER — Inpatient Hospital Stay: Payer: Medicare PPO | Admitting: Oncology

## 2021-01-02 ENCOUNTER — Inpatient Hospital Stay: Payer: Medicare PPO

## 2021-01-16 ENCOUNTER — Other Ambulatory Visit: Payer: Medicare PPO

## 2021-01-16 ENCOUNTER — Ambulatory Visit: Payer: Medicare PPO | Admitting: Oncology

## 2021-01-16 ENCOUNTER — Ambulatory Visit: Payer: Medicare PPO

## 2021-04-17 DEATH — deceased

## 2021-06-09 ENCOUNTER — Encounter: Payer: Self-pay | Admitting: Oncology
